# Patient Record
Sex: Male | Born: 1945 | Race: White | Hispanic: No | Marital: Married | Smoking: Former smoker
Health system: Southern US, Community
[De-identification: ages and names within clinical notes are randomized; demographics above are authoritative.]

## PROBLEM LIST (undated history)

## (undated) DIAGNOSIS — K5900 Constipation, unspecified: Secondary | ICD-10-CM

## (undated) DIAGNOSIS — C801 Malignant (primary) neoplasm, unspecified: Secondary | ICD-10-CM

## (undated) DIAGNOSIS — H409 Unspecified glaucoma: Secondary | ICD-10-CM

## (undated) DIAGNOSIS — G20A1 Parkinson's disease without dyskinesia, without mention of fluctuations: Secondary | ICD-10-CM

## (undated) DIAGNOSIS — K219 Gastro-esophageal reflux disease without esophagitis: Secondary | ICD-10-CM

## (undated) DIAGNOSIS — M1990 Unspecified osteoarthritis, unspecified site: Secondary | ICD-10-CM

## (undated) DIAGNOSIS — H269 Unspecified cataract: Secondary | ICD-10-CM

## (undated) HISTORY — DX: Constipation, unspecified: K59.00

## (undated) HISTORY — DX: Gastro-esophageal reflux disease without esophagitis: K21.9

## (undated) HISTORY — DX: Parkinson's disease without dyskinesia, without mention of fluctuations: G20.A1

## (undated) HISTORY — DX: Unspecified cataract: H26.9

## (undated) HISTORY — DX: Unspecified glaucoma: H40.9

## (undated) HISTORY — PX: TONSILLECTOMY: SUR1361

---

## 2012-07-11 ENCOUNTER — Other Ambulatory Visit: Payer: Self-pay | Admitting: Urology

## 2012-07-11 DIAGNOSIS — C61 Malignant neoplasm of prostate: Secondary | ICD-10-CM

## 2012-07-16 ENCOUNTER — Other Ambulatory Visit: Payer: Self-pay | Admitting: Urology

## 2012-08-02 ENCOUNTER — Other Ambulatory Visit (HOSPITAL_COMMUNITY): Payer: Self-pay | Admitting: Urology

## 2012-08-02 DIAGNOSIS — C61 Malignant neoplasm of prostate: Secondary | ICD-10-CM

## 2012-08-27 ENCOUNTER — Encounter (HOSPITAL_COMMUNITY): Payer: Self-pay

## 2012-08-27 ENCOUNTER — Encounter (HOSPITAL_COMMUNITY)
Admission: RE | Admit: 2012-08-27 | Discharge: 2012-08-27 | Disposition: A | Discharge status: Home / Self Care | Payer: BC Managed Care – PPO | Source: Ambulatory Visit | Attending: Urology | Admitting: Urology

## 2012-08-27 DIAGNOSIS — M4120 Other idiopathic scoliosis, site unspecified: Secondary | ICD-10-CM | POA: Insufficient documentation

## 2012-08-27 DIAGNOSIS — C61 Malignant neoplasm of prostate: Secondary | ICD-10-CM | POA: Insufficient documentation

## 2012-08-27 DIAGNOSIS — R972 Elevated prostate specific antigen [PSA]: Secondary | ICD-10-CM | POA: Insufficient documentation

## 2012-08-27 HISTORY — DX: Malignant (primary) neoplasm, unspecified: C80.1

## 2012-08-27 MED ORDER — TECHNETIUM TC 99M MEDRONATE IV KIT
25.0000 | PACK | Freq: Once | INTRAVENOUS | Status: AC | PRN
  Administered 2012-08-27: 25 via INTRAVENOUS

## 2012-08-28 ENCOUNTER — Encounter (HOSPITAL_COMMUNITY): Payer: Self-pay | Admitting: Pharmacy Technician

## 2012-09-05 ENCOUNTER — Encounter (HOSPITAL_COMMUNITY): Admission: RE | Admit: 2012-09-05 | Payer: Self-pay | Source: Ambulatory Visit

## 2012-09-05 ENCOUNTER — Encounter (HOSPITAL_COMMUNITY): Payer: Self-pay

## 2012-09-05 ENCOUNTER — Encounter (HOSPITAL_COMMUNITY)
Admission: RE | Admit: 2012-09-05 | Discharge: 2012-09-05 | Disposition: A | Discharge status: Home / Self Care | Payer: BC Managed Care – PPO | Source: Ambulatory Visit | Attending: Urology | Admitting: Urology

## 2012-09-05 DIAGNOSIS — Z01812 Encounter for preprocedural laboratory examination: Secondary | ICD-10-CM | POA: Insufficient documentation

## 2012-09-05 DIAGNOSIS — C61 Malignant neoplasm of prostate: Secondary | ICD-10-CM | POA: Insufficient documentation

## 2012-09-05 LAB — BASIC METABOLIC PANEL
CO2: 27 mEq/L (ref 19–32)
Calcium: 9 mg/dL (ref 8.4–10.5)
GFR calc non Af Amer: 86 mL/min — ABNORMAL LOW (ref >90)
Potassium: 4.5 mEq/L (ref 3.5–5.1)
Sodium: 137 mEq/L (ref 135–145)

## 2012-09-05 LAB — SURGICAL PCR SCREEN
MRSA, PCR: NEGATIVE
Staphylococcus aureus: NEGATIVE

## 2012-09-05 LAB — CBC
MCH: 32.2 pg (ref 26.0–34.0)
MCHC: 34.4 g/dL (ref 30.0–36.0)
Platelets: 204 10*3/uL (ref 150–400)
RBC: 4.47 MIL/uL (ref 4.22–5.81)

## 2012-09-05 NOTE — Pre-Procedure Instructions (Signed)
EKG AND CXR NOT NEEDED PER ANESTHESIOLOGIST'S GUIDELINES. 

## 2012-09-05 NOTE — Patient Instructions (Signed)
YOUR SURGERY IS SCHEDULED AT [facility]  ON:  Wednesday  5/14  REPORT TO [facility] AT:  10:30 AM      PHONE # FOR SHORT STAY IS [phone]  FOLLOW YOUR BOWEL PREP INSTRUCTIONS THE DAY BEFORE YOUR SURGERY - INSTRUCTIONS FROM DR. MANNY'S OFFICE.  DO NOT EAT  ANYTHING AFTER MIDNIGHT THE NIGHT BEFORE YOUR SURGERY.  NO FOOD, NO CHEWING GUM, NO MINTS, NO CANDIES, NO CHEWING TOBACCO. YOU MAY HAVE CLEAR LIQUIDS TO DRINK FROM MIDNIGHT UNTIL 7:00 AM DAY OF YOUR SURGERY - LIKE WATER, COFFEE ( NO MILK OR MILK PRODUCTS ).    NOTHING TO DRINK AFTER 7:00 AM DAY OF YOUR SURGERY.  PLEASE TAKE THE FOLLOWING MEDICATIONS THE AM OF YOUR SURGERY WITH A FEW SIPS OF WATER:  RANITIDINE.   BRING YOUR EYE DROPS TO THE HOSPITAL.    DO NOT BRING VALUABLES, MONEY, CREDIT CARDS.  DO NOT WEAR JEWELRY, MAKE-UP, NAIL POLISH AND NO METAL PINS OR CLIPS IN YOUR HAIR. CONTACT LENS, DENTURES / PARTIALS, GLASSES SHOULD NOT BE WORN TO SURGERY AND IN MOST CASES-HEARING AIDS WILL NEED TO BE REMOVED.  BRING YOUR GLASSES CASE, ANY EQUIPMENT NEEDED FOR YOUR CONTACT LENS. FOR PATIENTS ADMITTED TO THE HOSPITAL--CHECK OUT TIME THE DAY OF DISCHARGE IS 11:00 AM.  ALL INPATIENT ROOMS ARE PRIVATE - WITH BATHROOM, TELEPHONE, TELEVISION AND WIFI INTERNET.                                PLEASE READ OVER ANY  FACT SHEETS THAT YOU WERE GIVEN: MRSA INFORMATION, BLOOD TRANSFUSION INFORMATION FAILURE TO FOLLOW THESE INSTRUCTIONS MAY RESULT IN THE CANCELLATION OF YOUR SURGERY.   PATIENT SIGNATURE_________________________________

## 2012-09-12 ENCOUNTER — Encounter (HOSPITAL_COMMUNITY): Payer: Self-pay | Admitting: Certified Registered"

## 2012-09-12 ENCOUNTER — Inpatient Hospital Stay (HOSPITAL_COMMUNITY)
Admission: RE | Admit: 2012-09-12 | Discharge: 2012-09-13 | DRG: 335 | Disposition: A | Discharge status: Home / Self Care | Payer: BC Managed Care – PPO | Source: Ambulatory Visit | Attending: Urology | Admitting: Urology

## 2012-09-12 ENCOUNTER — Encounter (HOSPITAL_COMMUNITY): Payer: Self-pay | Admitting: *Deleted

## 2012-09-12 ENCOUNTER — Encounter (HOSPITAL_COMMUNITY)
Admission: RE | Disposition: A | Discharge status: Home / Self Care | Payer: Self-pay | Source: Ambulatory Visit | Attending: Urology

## 2012-09-12 ENCOUNTER — Inpatient Hospital Stay (HOSPITAL_COMMUNITY): Payer: BC Managed Care – PPO | Admitting: Certified Registered"

## 2012-09-12 DIAGNOSIS — K429 Umbilical hernia without obstruction or gangrene: Secondary | ICD-10-CM | POA: Diagnosis present

## 2012-09-12 DIAGNOSIS — Z01812 Encounter for preprocedural laboratory examination: Secondary | ICD-10-CM

## 2012-09-12 DIAGNOSIS — C61 Malignant neoplasm of prostate: Principal | ICD-10-CM | POA: Diagnosis present

## 2012-09-12 HISTORY — PX: ROBOT ASSISTED LAPAROSCOPIC RADICAL PROSTATECTOMY: SHX5141

## 2012-09-12 HISTORY — PX: LYMPHADENECTOMY: SHX5960

## 2012-09-12 LAB — HEMOGLOBIN AND HEMATOCRIT, BLOOD
HCT: 41.3 % (ref 39.0–52.0)
Hemoglobin: 14.2 g/dL (ref 13.0–17.0)

## 2012-09-12 LAB — TYPE AND SCREEN
ABO/RH(D): O NEG
Antibody Screen: NEGATIVE

## 2012-09-12 SURGERY — ROBOTIC ASSISTED LAPAROSCOPIC RADICAL PROSTATECTOMY
Anesthesia: General | Wound class: Clean Contaminated

## 2012-09-12 MED ORDER — ONDANSETRON HCL 4 MG/2ML IJ SOLN: 4.0000 mg | INTRAMUSCULAR | Status: DC | PRN

## 2012-09-12 MED ORDER — ONDANSETRON HCL 4 MG/2ML IJ SOLN
INTRAMUSCULAR | Status: DC | PRN
  Administered 2012-09-12: 4 mg via INTRAVENOUS

## 2012-09-12 MED ORDER — KCL IN DEXTROSE-NACL 20-5-0.45 MEQ/L-%-% IV SOLN
INTRAVENOUS | Status: AC
  Administered 2012-09-12: 21:00:00 via INTRAVENOUS
  Filled 2012-09-12: qty 1000

## 2012-09-12 MED ORDER — PROMETHAZINE HCL 25 MG/ML IJ SOLN
12.5000 mg | Freq: Four times a day (QID) | INTRAMUSCULAR | Status: DC | PRN
  Administered 2012-09-12: 12.5 mg via INTRAVENOUS
  Filled 2012-09-12: qty 1

## 2012-09-12 MED ORDER — HYDROMORPHONE HCL PF 1 MG/ML IJ SOLN: 0.5000 mg | INTRAMUSCULAR | Status: DC | PRN

## 2012-09-12 MED ORDER — RANITIDINE HCL 150 MG PO TABS
150.0000 mg | ORAL_TABLET | Freq: Two times a day (BID) | ORAL | Status: DC
  Administered 2012-09-13: 150 mg via ORAL

## 2012-09-12 MED ORDER — SODIUM CHLORIDE 0.9 % IJ SOLN
INTRAMUSCULAR | Status: DC | PRN
  Administered 2012-09-12: 15:00:00

## 2012-09-12 MED ORDER — FLUTICASONE PROPIONATE 50 MCG/ACT NA SUSP
1.0000 | Freq: Every day | NASAL | Status: DC
  Administered 2012-09-12 – 2012-09-13 (×2): 1 via NASAL
  Filled 2012-09-12: qty 16

## 2012-09-12 MED ORDER — ROCURONIUM BROMIDE 100 MG/10ML IV SOLN
INTRAVENOUS | Status: DC | PRN
  Administered 2012-09-12 (×2): 10 mg via INTRAVENOUS
  Administered 2012-09-12: 50 mg via INTRAVENOUS
  Administered 2012-09-12: 10 mg via INTRAVENOUS

## 2012-09-12 MED ORDER — BACITRACIN-NEOMYCIN-POLYMYXIN 400-5-5000 EX OINT: 1.0000 "application " | TOPICAL_OINTMENT | Freq: Three times a day (TID) | CUTANEOUS | Status: DC | PRN

## 2012-09-12 MED ORDER — ACETAMINOPHEN 10 MG/ML IV SOLN
1000.0000 mg | Freq: Four times a day (QID) | INTRAVENOUS | Status: AC
  Administered 2012-09-12 – 2012-09-13 (×4): 1000 mg via INTRAVENOUS
  Filled 2012-09-12 (×4): qty 100

## 2012-09-12 MED ORDER — MIDAZOLAM HCL 5 MG/5ML IJ SOLN
INTRAMUSCULAR | Status: DC | PRN
  Administered 2012-09-12: 2 mg via INTRAVENOUS

## 2012-09-12 MED ORDER — MENTHOL 3 MG MT LOZG: 1.0000 | LOZENGE | OROMUCOSAL | Status: DC | PRN

## 2012-09-12 MED ORDER — OXYCODONE HCL 5 MG PO TABS: 5.0000 mg | ORAL_TABLET | ORAL | Status: DC | PRN

## 2012-09-12 MED ORDER — CEFAZOLIN SODIUM-DEXTROSE 2-3 GM-% IV SOLR
2.0000 g | INTRAVENOUS | Status: AC
  Administered 2012-09-12: 2 g via INTRAVENOUS

## 2012-09-12 MED ORDER — LACTATED RINGERS IR SOLN
Status: DC | PRN
  Administered 2012-09-12: 1000 mL

## 2012-09-12 MED ORDER — FENTANYL CITRATE 0.05 MG/ML IJ SOLN
INTRAMUSCULAR | Status: DC | PRN
  Administered 2012-09-12: 100 ug via INTRAVENOUS
  Administered 2012-09-12 (×3): 50 ug via INTRAVENOUS

## 2012-09-12 MED ORDER — NEOSTIGMINE METHYLSULFATE 1 MG/ML IJ SOLN
INTRAMUSCULAR | Status: DC | PRN
  Administered 2012-09-12: 4 mg via INTRAVENOUS

## 2012-09-12 MED ORDER — ACETAMINOPHEN 325 MG PO TABS: 650.0000 mg | ORAL_TABLET | ORAL | Status: DC | PRN

## 2012-09-12 MED ORDER — LABETALOL HCL 5 MG/ML IV SOLN
INTRAVENOUS | Status: DC | PRN
  Administered 2012-09-12 (×2): 5 mg via INTRAVENOUS

## 2012-09-12 MED ORDER — SENNA 8.6 MG PO TABS
1.0000 | ORAL_TABLET | Freq: Two times a day (BID) | ORAL | Status: DC
  Administered 2012-09-12 – 2012-09-13 (×2): 8.6 mg via ORAL
  Filled 2012-09-12 (×2): qty 1

## 2012-09-12 MED ORDER — HYDROCODONE-ACETAMINOPHEN 5-325 MG PO TABS: 1.0000 | ORAL_TABLET | Freq: Four times a day (QID) | ORAL | Status: DC | PRN

## 2012-09-12 MED ORDER — PROPOFOL 10 MG/ML IV BOLUS
INTRAVENOUS | Status: DC | PRN
  Administered 2012-09-12: 200 mg via INTRAVENOUS

## 2012-09-12 MED ORDER — KCL IN DEXTROSE-NACL 20-5-0.45 MEQ/L-%-% IV SOLN
INTRAVENOUS | Status: DC
  Administered 2012-09-13: 07:00:00 via INTRAVENOUS
  Filled 2012-09-12 (×3): qty 1000

## 2012-09-12 MED ORDER — HYDROMORPHONE HCL PF 1 MG/ML IJ SOLN
INTRAMUSCULAR | Status: DC | PRN
  Administered 2012-09-12 (×2): 1 mg via INTRAVENOUS

## 2012-09-12 MED ORDER — PSEUDOEPHEDRINE HCL 60 MG PO TABS
60.0000 mg | ORAL_TABLET | ORAL | Status: DC | PRN
  Filled 2012-09-12: qty 1

## 2012-09-12 MED ORDER — GLYCOPYRROLATE 0.2 MG/ML IJ SOLN
INTRAMUSCULAR | Status: DC | PRN
  Administered 2012-09-12: .7 mg via INTRAVENOUS

## 2012-09-12 MED ORDER — PHENOL 1.4 % MT LIQD: 1.0000 | OROMUCOSAL | Status: DC | PRN

## 2012-09-12 MED ORDER — ACETAMINOPHEN 10 MG/ML IV SOLN
INTRAVENOUS | Status: DC | PRN
  Administered 2012-09-12: 1000 mg via INTRAVENOUS

## 2012-09-12 MED ORDER — LACTATED RINGERS IV SOLN
INTRAVENOUS | Status: AC
  Administered 2012-09-12: 1000 mL via INTRAVENOUS
  Administered 2012-09-12: 14:00:00 via INTRAVENOUS

## 2012-09-12 MED ORDER — ZOLPIDEM TARTRATE 5 MG PO TABS: 5.0000 mg | ORAL_TABLET | Freq: Every evening | ORAL | Status: DC | PRN

## 2012-09-12 MED ORDER — DOCUSATE SODIUM 100 MG PO CAPS
100.0000 mg | ORAL_CAPSULE | Freq: Two times a day (BID) | ORAL | Status: DC
  Administered 2012-09-12 – 2012-09-13 (×2): 100 mg via ORAL
  Filled 2012-09-12 (×3): qty 1

## 2012-09-12 MED ORDER — HYDROMORPHONE HCL PF 1 MG/ML IJ SOLN: 0.2500 mg | INTRAMUSCULAR | Status: DC | PRN

## 2012-09-12 MED ORDER — SODIUM CHLORIDE 0.9 % IV SOLN: 1.5000 g | INTRAVENOUS | Status: DC

## 2012-09-12 MED ORDER — BUPIVACAINE LIPOSOME 1.3 % IJ SUSP
20.0000 mL | Freq: Once | INTRAMUSCULAR | Status: DC
  Filled 2012-09-12: qty 20

## 2012-09-12 MED ORDER — BELLADONNA ALKALOIDS-OPIUM 16.2-60 MG RE SUPP: 1.0000 | Freq: Four times a day (QID) | RECTAL | Status: DC | PRN

## 2012-09-12 MED ORDER — CIPROFLOXACIN HCL 500 MG PO TABS: 500.0000 mg | ORAL_TABLET | Freq: Two times a day (BID) | ORAL | Status: DC

## 2012-09-12 MED ORDER — FAMOTIDINE 20 MG PO TABS
20.0000 mg | ORAL_TABLET | Freq: Two times a day (BID) | ORAL | Status: DC
  Filled 2012-09-12: qty 1

## 2012-09-12 MED ORDER — LIDOCAINE HCL (PF) 2 % IJ SOLN
INTRAMUSCULAR | Status: DC | PRN
  Administered 2012-09-12: 20 mg

## 2012-09-12 MED ORDER — LACTATED RINGERS IV SOLN
INTRAVENOUS | Status: DC
  Administered 2012-09-12: 16:00:00 via INTRAVENOUS

## 2012-09-12 MED ORDER — ONDANSETRON HCL 4 MG/2ML IJ SOLN
4.0000 mg | INTRAMUSCULAR | Status: DC | PRN
  Administered 2012-09-12: 4 mg via INTRAVENOUS
  Filled 2012-09-12: qty 2

## 2012-09-12 MED ORDER — LATANOPROST 0.005 % OP SOLN
1.0000 [drp] | Freq: Every day | OPHTHALMIC | Status: DC
  Administered 2012-09-12: 1 [drp] via OPHTHALMIC
  Filled 2012-09-12: qty 2.5

## 2012-09-12 MED ORDER — DEXAMETHASONE SODIUM PHOSPHATE 10 MG/ML IJ SOLN
INTRAMUSCULAR | Status: DC | PRN
  Administered 2012-09-12: 10 mg via INTRAVENOUS

## 2012-09-12 MED ORDER — SUCCINYLCHOLINE CHLORIDE 20 MG/ML IJ SOLN
INTRAMUSCULAR | Status: DC | PRN
  Administered 2012-09-12: 100 mg via INTRAVENOUS

## 2012-09-12 SURGICAL SUPPLY — 51 items
CANISTER SUCTION 2500CC (MISCELLANEOUS) ×3 IMPLANT
CATH FOLEY 2WAY SLVR 18FR 30CC (CATHETERS) ×3 IMPLANT
CATH TIEMANN FOLEY 18FR 5CC (CATHETERS) ×3 IMPLANT
CHLORAPREP W/TINT 26ML (MISCELLANEOUS) ×3 IMPLANT
CLIP LIGATING HEM O LOK PURPLE (MISCELLANEOUS) ×12 IMPLANT
CLIP LIGATING HEMO LOK XL GOLD (MISCELLANEOUS) IMPLANT
CLOTH BEACON ORANGE TIMEOUT ST (SAFETY) ×3 IMPLANT
CONT SPECI 4OZ STER CLIK (MISCELLANEOUS) ×3 IMPLANT
CORD HIGH FREQUENCY UNIPOLAR (ELECTROSURGICAL) ×3 IMPLANT
COVER SURGICAL LIGHT HANDLE (MISCELLANEOUS) ×3 IMPLANT
COVER TIP SHEARS 8 DVNC (MISCELLANEOUS) ×2 IMPLANT
COVER TIP SHEARS 8MM DA VINCI (MISCELLANEOUS) ×1
CUTTER ECHEON FLEX ENDO 45 340 (ENDOMECHANICALS) ×3 IMPLANT
DECANTER SPIKE VIAL GLASS SM (MISCELLANEOUS) IMPLANT
DERMABOND ADVANCED (GAUZE/BANDAGES/DRESSINGS) ×1
DERMABOND ADVANCED .7 DNX12 (GAUZE/BANDAGES/DRESSINGS) ×2 IMPLANT
DRAPE SURG IRRIG POUCH 19X23 (DRAPES) ×3 IMPLANT
DRSG TEGADERM 2-3/8X2-3/4 SM (GAUZE/BANDAGES/DRESSINGS) ×12 IMPLANT
DRSG TEGADERM 4X4.75 (GAUZE/BANDAGES/DRESSINGS) ×6 IMPLANT
DRSG TEGADERM 6X8 (GAUZE/BANDAGES/DRESSINGS) ×6 IMPLANT
ELECT REM PT RETURN 9FT ADLT (ELECTROSURGICAL) ×3
ELECTRODE REM PT RTRN 9FT ADLT (ELECTROSURGICAL) ×2 IMPLANT
GAUZE SPONGE 2X2 8PLY STRL LF (GAUZE/BANDAGES/DRESSINGS) ×2 IMPLANT
GLOVE BIO SURGEON STRL SZ 6.5 (GLOVE) ×3 IMPLANT
GLOVE BIOGEL M STRL SZ7.5 (GLOVE) ×24 IMPLANT
GOWN STRL NON-REIN LRG LVL3 (GOWN DISPOSABLE) ×6 IMPLANT
GOWN STRL REIN XL XLG (GOWN DISPOSABLE) ×9 IMPLANT
HOLDER FOLEY CATH W/STRAP (MISCELLANEOUS) ×3 IMPLANT
IV LACTATED RINGERS 1000ML (IV SOLUTION) ×3 IMPLANT
KIT ACCESSORY DA VINCI DISP (KITS) ×1
KIT ACCESSORY DVNC DISP (KITS) ×2 IMPLANT
NEEDLE INSUFFLATION 14GA 120MM (NEEDLE) ×3 IMPLANT
NEEDLE SPNL 22GX9.0 ACCUTG (NEEDLE) ×3 IMPLANT
PACK ROBOT UROLOGY CUSTOM (CUSTOM PROCEDURE TRAY) ×3 IMPLANT
RELOAD GREEN ECHELON 45 (STAPLE) ×3 IMPLANT
SET TUBE IRRIG SUCTION NO TIP (IRRIGATION / IRRIGATOR) ×3 IMPLANT
SOLUTION ELECTROLUBE (MISCELLANEOUS) ×3 IMPLANT
SPONGE GAUZE 2X2 STER 10/PKG (GAUZE/BANDAGES/DRESSINGS) ×1
SPONGE LAP 4X18 X RAY DECT (DISPOSABLE) ×3 IMPLANT
SUT ETHILON 3 0 PS 1 (SUTURE) ×3 IMPLANT
SUT MNCRL AB 4-0 PS2 18 (SUTURE) ×6 IMPLANT
SUT PDS AB 1 CT1 27 (SUTURE) ×6 IMPLANT
SUT SILK 2 0 SH (SUTURE) ×3 IMPLANT
SUT VICRYL 0 UR6 27IN ABS (SUTURE) ×3 IMPLANT
SUT VLOC BARB 180 ABS3/0GR12 (SUTURE) ×9
SUTURE VLOC BRB 180 ABS3/0GR12 (SUTURE) ×6 IMPLANT
SYR 27GX1/2 1ML LL SAFETY (SYRINGE) ×3 IMPLANT
SYR 5ML LL (SYRINGE) ×3 IMPLANT
TOWEL OR NON WOVEN STRL DISP B (DISPOSABLE) ×3 IMPLANT
TROCAR 12M 150ML BLUNT (TROCAR) ×3 IMPLANT
WATER STERILE IRR 1500ML POUR (IV SOLUTION) ×6 IMPLANT

## 2012-09-12 NOTE — OR Nursing (Signed)
Used 0.48ml of fluorescence imaging dye

## 2012-09-12 NOTE — Anesthesia Postprocedure Evaluation (Signed)
  Anesthesia Post-op Note  Patient: Melvin [name]  Procedure(s) Performed: Procedure(s) (LRB): ROBOTIC ASSISTED LAPAROSCOPIC RADICAL PROSTATECTOMY (N/A) LYMPHADENECTOMY (Bilateral)  Patient Location: PACU  Anesthesia Type: General  Level of Consciousness: awake and alert   Airway and Oxygen Therapy: Patient Spontanous Breathing  Post-op Pain: mild  Post-op Assessment: Post-op Vital signs reviewed, Patient's Cardiovascular Status Stable, Respiratory Function Stable, Patent Airway and No signs of Nausea or vomiting  Last Vitals:  Filed Vitals:   09/12/12 1707  BP: 123/67  Pulse: 58  Temp: 36.9 C  Resp: 15    Post-op Vital Signs: stable   Complications: No apparent anesthesia complications

## 2012-09-12 NOTE — Transfer of Care (Signed)
Immediate Anesthesia Transfer of Care Note  Patient: Melvin [name]  Procedure(s) Performed: Procedure(s) (LRB): ROBOTIC ASSISTED LAPAROSCOPIC RADICAL PROSTATECTOMY (N/A) LYMPHADENECTOMY (Bilateral)  Patient Location: PACU  Anesthesia Type: General  Level of Consciousness: sedated, patient cooperative and responds to stimulaton  Airway & Oxygen Therapy: Patient Spontanous Breathing and Patient connected to face mask oxgen  Post-op Assessment: Report given to PACU RN and Post -op Vital signs reviewed and stable  Post vital signs: Reviewed and stable  Complications: No apparent anesthesia complications

## 2012-09-12 NOTE — Preoperative (Signed)
Beta Blockers   Reason not to administer Beta Blockers:Not Applicable 

## 2012-09-12 NOTE — Anesthesia Preprocedure Evaluation (Addendum)
Anesthesia Evaluation  Patient identified by MRN, date of birth, ID band Patient awake    Reviewed: Allergy & Precautions, H&P , NPO status , Patient's Chart, lab work & pertinent test results  Airway Mallampati: II TM Distance: >3 FB Neck ROM: full    Dental  (+) Dental Advisory Given and Caps All 4 upper front teeth are capped:   Pulmonary neg pulmonary ROS,  breath sounds clear to auscultation  Pulmonary exam normal       Cardiovascular Exercise Tolerance: Good negative cardio ROS  Rhythm:regular Rate:Normal     Neuro/Psych negative neurological ROS  negative psych ROS   GI/Hepatic negative GI ROS, Neg liver ROS,   Endo/Other  negative endocrine ROS  Renal/GU negative Renal ROS  negative genitourinary   Musculoskeletal   Abdominal   Peds  Hematology negative hematology ROS (+)   Anesthesia Other Findings   Reproductive/Obstetrics negative OB ROS                          Anesthesia Physical Anesthesia Plan  ASA: I  Anesthesia Plan: General   Post-op Pain Management:    Induction: Intravenous  Airway Management Planned: Oral ETT  Additional Equipment:   Intra-op Plan:   Post-operative Plan: Extubation in OR  Informed Consent: I have reviewed the patients History and Physical, chart, labs and discussed the procedure including the risks, benefits and alternatives for the proposed anesthesia with the patient or authorized representative who has indicated his/her understanding and acceptance.   Dental Advisory Given  Plan Discussed with: CRNA and Surgeon  Anesthesia Plan Comments:         Anesthesia Quick Evaluation

## 2012-09-12 NOTE — Brief Op Note (Signed)
09/12/2012  3:18 PM  PATIENT:  Melvin [name]  67 y.o. male  PRE-OPERATIVE DIAGNOSIS:  PROSTATE CANCER  POST-OPERATIVE DIAGNOSIS:  PROSTATE CANCER  PROCEDURE:  Procedure(s): ROBOTIC ASSISTED LAPAROSCOPIC RADICAL PROSTATECTOMY (N/A) LYMPHADENECTOMY (Bilateral)  SURGEON:  Surgeon(s) and Role:    * Sebastian Ache, MD - Primary  PHYSICIAN ASSISTANT:   ASSISTANTS: Lujean Rave, PA   ANESTHESIA:   general  EBL:  Total I/O In: 1000 [I.V.:1000] Out: -   BLOOD ADMINISTERED:none  DRAINS: 1 - JP to bulb suction, 2 - Foley to straight drain   LOCAL MEDICATIONS USED:  MARCAINE     SPECIMEN:  Source of Specimen:  1 - Rt and Lt Obturator nodes, Lt ext. iliac node, Rt ext iliac nodes sentinal, Radical prostatectomy  DISPOSITION OF SPECIMEN:  PATHOLOGY  COUNTS:  YES  TOURNIQUET:  * No tourniquets in log *  DICTATION: .Other Dictation: Dictation Number [id number]  PLAN OF CARE: Admit for overnight observation  PATIENT DISPOSITION:  PACU - hemodynamically stable.   Delay start of Pharmacological VTE agent (>24hrs) due to surgical blood loss or risk of bleeding: yes

## 2012-09-12 NOTE — H&P (Signed)
Melvin [name] is an 67 y.o. male.    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 Volume 25mL, no medain lobe by TRUS. CT and bone scan w/o evidence of distant disease. Small fat-containing umbilical hernia noted.  PMH for hiatal hernia on PPI. Only prior surgery is childhood TNA. No CV disease. No blood thinners.   Today Melvin [name] is seen to proceed with robotic prostatectomy + node dissection + repair small umbilical hernia.   Past Medical History  Diagnosis Date  . Cancer     No past surgical history on file.  No family history on file. Social History:  has no tobacco, alcohol, and drug history on file.  Allergies:  Allergies  Allergen Reactions  . Sulfa Antibiotics Hives    No prescriptions prior to admission    No results found for this or any previous visit (from the past 48 hour(s)). No results found.  Review of Systems  Constitutional: Negative.  Negative for fever, chills and weight loss.  HENT: Negative.   Eyes: Negative.   Respiratory: Negative.   Cardiovascular: Negative.  Negative for PND.  Gastrointestinal: Negative.   Genitourinary: Negative.   Musculoskeletal: Negative.   Skin: Negative.   Neurological: Negative.   Endo/Heme/Allergies: Negative.   Psychiatric/Behavioral: Negative.     There were no vitals taken for this visit. Physical Exam  Constitutional: He is oriented to person, place, and time. He appears well-developed and well-nourished.  HENT:  Head: Normocephalic and atraumatic.  Eyes: EOM are normal. Pupils are equal, round, and reactive to light.  Neck: Normal range of motion. Neck supple.  Cardiovascular: Normal rate.   Respiratory: Effort normal and breath sounds normal.  GI: Soft. Bowel sounds  are normal.  Small umbilical hernia palpable  Genitourinary: Penis normal.  Musculoskeletal: Normal range of motion.  Neurological: He is alert and oriented to person, place, and time.  Skin: Skin is warm and dry.  Psychiatric: He has a normal mood and affect. His behavior is normal. Judgment and thought content normal.     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  We re-discussed prostatectomy and specifically robotic prostatectomy with bilateral pelvic lymphadenectomy being the technique that I most commonly perform. I showed the patient on their abdomen the approximately 6 small incision (trocar) sites as well as presumed extraction sites with robotic approach as well as possible open incision sites should open conversion be necessary. We discussed peri-operative risks including bleeding, infection, deep vein thrombosis, pulmonary embolism, compartment syndrome, nuropathy / neuropraxia, heart attack, stroke, death, as well as long-term risks such as non-cure / need for additional therapy. We specifically addressed that the procedure would compromise urinary control leading to stress incontinence which typically resolves with time and pelvic rehabilitation (Kegel's, etc..), but can sometimes be permanent and require additional therapy including surgery. We also specifically addressed sexual sequellae including significant erectile dysfunction which typically partially resolves with time but can also be permanent and require additional therapy including surgery.   We re-discussed the typical hospital course including usual 1-2 night hospitalization, discharge with foley catheter in place usually for 1-2 weeks before voiding trial as well as usually 2 week recovery until able to perform most non-strenuous activity and 6 weeks until  able to return to most jobs and more strenuous activity such as exercise.   He wants to proceed with surgery as scheduled.  Will also repair small umbilical hernia and perform extended template + ICG sentinal nodes given moderate risk disease.   Melvin [name] 09/12/2012, 6:29 AM

## 2012-09-13 ENCOUNTER — Encounter (HOSPITAL_COMMUNITY): Payer: Self-pay | Admitting: Urology

## 2012-09-13 LAB — HEMOGLOBIN AND HEMATOCRIT, BLOOD: Hemoglobin: 13.2 g/dL (ref 13.0–17.0)

## 2012-09-13 LAB — CREATININE, FLUID (PLEURAL, PERITONEAL, JP DRAINAGE): Creat, Fluid: 0.9 mg/dL

## 2012-09-13 MED ORDER — OXYBUTYNIN CHLORIDE 5 MG PO TABS: 5.0000 mg | ORAL_TABLET | Freq: Three times a day (TID) | ORAL | Status: DC | PRN

## 2012-09-13 MED ORDER — SENNA-DOCUSATE SODIUM 8.6-50 MG PO TABS: 1.0000 | ORAL_TABLET | Freq: Two times a day (BID) | ORAL | Status: DC

## 2012-09-13 NOTE — Op Note (Signed)
NAMEKODIE, KISHI NO.:  [id number]  MEDICAL RECORD NO.:  [id number]  LOCATION:  1403                         FACILITY:  [facility]  PHYSICIAN:  Sebastian Ache, MD     DATE OF BIRTH:  01-Apr-1946  DATE OF PROCEDURE: 09/12/2012 DATE OF DISCHARGE:                              OPERATIVE REPORT   DIAGNOSIS:  Moderate risk prostate cancer.  PROCEDURE:  Robotic-assisted laparoscopic radical prostatectomy with bilateral extended pelvic lymphadenectomy, sentinel type.  ASSISTANT:  Pecola Leisure, PA.  ESTIMATED BLOOD LOSS:  Less than 100 mL.  SPECIMEN: 1. Radical prostatectomy. 2. Left external iliac lymph nodes. 3. Left obturator lymph nodes. 4. Right obturator lymph nodes. 5. Right external iliac lymph nodes,  sentinel.  FINDINGS:  Hyperfluorescent right proximal external iliac lymph nodes. This was just lateral to the right ureter.  DRAINS: 1. Al Pimple drain to bulb suction. 2. Foley catheter to straight drain.  INDICATION:  Mr. Neubert is a pleasant 67 year old gentleman with history of moderate risk prostate cancer that was found on workup with elevated PSA. Staging evaluation with CT and bone scan which revealed no evidence of distant disease.  Options were discussed including ablative therapies versus surgical extirpation and wished to proceed with the latter with minimally invasive assistance.  Informed consent was obtained and placed in medical record.  PROCEDURE IN DETAIL:  The patient being, Melvin [name], was verified. Procedure being robotic prostatectomy was confirmed.  Procedure was carried out.  Time-out was performed.  Intravenous antibiotics administered.  General endotracheal anesthesia was induced.  The patient was placed into a low lithotomy position.  Sterile field was created by prepping the patient's penis, perineum, and proximal thighs using iodine as well as the infraxiphoid abdomen using chlorhexidine gluconate.  He was further  fashioned on the operative table using 3-inch tape over padding.  Deep Trendelenburg position was performed and found to be suitably positioned.  Foley catheter was placed to straight drain. Next, a high-flow low pressure pneumoperitoneum was obtained using Veress technique in the infraumbilical midline.  A 12-mm robotic camera port was then placed in same location.  Laparoscopic examination of peritoneal cavity revealed no significant adhesions and no evidence of visceral injury.  Additional ports were placed as follows; a right paramedian 5-mm assist port, right paramedian 8-mm robotic port, right far lateral 12-mm assistant port, left paramedian 8-mm robotic port, and the far lateral 8-mm robotic port. Robot was docked and passed through its electronic checks, attention was then directed at development of the space of Retzius.  Incision was made lateral to the left medial umbilical ligament from the midline towards the area of the external ring and coursing along the iliac vessels.  The bladder was then carefully swept away from the sidewall towards the area of the endopelvic fascia.  The mirror image dissection was performed on the right side sweeping the bladder away from the pelvic side wall. Intervening medial attachments were taken down using cautery scissors to expose the anterior base of the prostate.  Next, using spinal needle placed just superior to the pubic symphysis.  The left lobe of the prostate was injected with 0.2 mL of indocyanine green as  was the right lobe with intervening aspiration technique to avoid spillage and endopelvic fascia was then carefully swept away from the lateral aspects of the prostate from the base toward the apex and dorsal venous complex was controlled using vascular stapler technique taking great care to avoid injury to the membranous urethra which did not occur and within approximately 15 minutes post ICG injection, the pelvis was  visualized under infrared fluorescence and there was found to be a single right external iliac lymph node that  was hyperfluorescent corresponding with the sentinel node.  No other hyperfluorescence  nodes were visualized in pelvis. Attention was then directed at pelvic lymphadenectomy first in the right side, all fiber fatty tissue in the confines of the obturator nerve, pelvic sidewall, and external iliac vein were carefully mobilized.  Hemostasis was achieved using cold clips and this was labeled as right obturator lymph nodes. The right external iliac packet was then carefully dissected to the confines being the genitofemoral nerve, iliac vein, pelvic sidewall, and ureter.  Again, a single hyperfluorescent node was within this packet and was just lateral to the ureter.  Hemostasis again achieved with cold clips.  A mirror image lymphadenectomy was performed on the left side. However, there are no sentinel nodes.  The left obturator and left external iliac packets were sent separately.  Obturator nerves were visualized after these maneuvers were found to be uninjured.  Next, the bladder neck dissection was performed.  We identified the bladder neck By moving the catheter back and forth.  Dissection proceeded in the anterior-posterior direction separating the bladder neck from the area of the base of the prostate.  The caliber of the bladder neck appeared to be such that reconstruction would not be necessary.  Posterior dissection was performed by incising 7 mm posterior-inferiorly to the inferior lip of the bladder dissecting directly posterior.  Bilateral vas deferens were encountered, dissected for distance of 4 cm, placed on gentle superior traction.  Bilateral seminal vesicles were encountered, dissected towards their tips, placed on gentle superior traction. Dissection was then proceeded in the plane of the [place] in a base- to-apex orientation thus exposing the vascular and  neural pedicles on each side, and these were controlled using sequential clipping technique from the base towards the apex.  At the area of the apex, the presumed neurovascular bundles were carefully swept lateral to the prostate. There was no obvious gross disease in this location.  I felt it was safe to proceed with bilateral nerve-sparing and this was  performed. Contralateral pedicle was similarly achieved with cold clips.  This exposed the membranous urethra.  This was approached on the anterior plane and transected using cold scissors.  This completely freed up the prostatectomy specimen which was placed into an EndoCatch bag for later retrieval.  Attention was then directed to the posterior reconstruction.  A 3-0 V-Loc suture was used to reapproximate the posterior urethral plate to the posterior bladder neck, was brought the structures into tension-free apposition.  Next, urethra and bladder anastomosis was performed using double-armed V-Loc suture from 6 o'clock to 12 o'clock position, taking great care to avoid inclusion of the Foley catheter.  A new Foley catheter was then easily placed and irrigated quantitatively without obvious leak.  Anterior reconstruction was performed by anchoring the previous anastomotic suture to the puboprostatic ligaments.  Hemostasis appeared excellent. All sponge and needle counts were correct.  A closed suction drain was brought through the left lateral-most robotic port into the area of the  pelvis and robot was undocked.  A previous 12-mm assistant port site was closed at the level of the fascia using a Carter-Thomason suture passer under laparoscopic vision.  The specimen was retrieved by extending the Previous camera port site for total distance of approximately 4 cm, removed specimens in its entirety, setting aside for permanent pathology and  closing at the level of fascia using figure-of-eight PDS x3 followed by approximation of Scarpa using  2-0 Vicryl.  All skin incisions were infiltrated with dilute Marcaine and closed at the level of the skin using subcuticular Monocryl followed by Dermabond.  A drain stitch was applied.  Procedure was then terminated.  The patient tolerated the procedure well.  There were no immediate periprocedural complications.  The patient was taken to the postanesthesia care unit in stable condition.          ______________________________ Sebastian Ache, MD     TM/MEDQ  D:  09/12/2012  T:  09/13/2012  Job:  [id number]

## 2012-09-13 NOTE — Progress Notes (Signed)
Utilization review completed.  

## 2012-09-13 NOTE — Progress Notes (Signed)
Pt BP 114/46.  Notified PA.  Will continue to monitor.

## 2012-09-13 NOTE — Discharge Summary (Signed)
Physician Discharge Summary  Patient ID: Melvin [name] MRN: [id number] DOB/AGE: 03-Sep-1945 68 y.o.  Admit date: 09/12/2012 Discharge date: 09/13/2012  Admission Diagnoses: Moderate Risk Prostate Cancer  Discharge Diagnoses: Moderate Risk Prostate Cancer   Discharged Condition: good  Hospital Course:   Pt underwent robotic assisted prostatectomy with bilateral pelvic lymphadenectomy on the day of admission without acute complications. He was admitted to the 4th floor Urology service post-op where he began his vigorous recovery. By POD 1, the day of discharge, he was ambulatory, tolerating regular diet, pain controlled with PO meds and felt to be adequate for discharge. His JP outputs trended down, Cr same as serum, therefore discontinued.   Consults: None  Significant Diagnostic Studies: labs: Hgb >10, Cr 0.9 of JP fluid  Treatments: surgery: robotic assisted prostatectomy with bilateral pelvic lymphadenectomy  Discharge Exam: Blood pressure 114/46, pulse 72, temperature 98.4 F (36.9 C), temperature source Oral, resp. rate 18, height 6' 1.5" (1.867 m), weight 96.571 kg (212 lb 14.4 oz), SpO2 98.00%. General appearance: alert, cooperative, appears stated age and Fiancee at bedside Head: Normocephalic, without obvious abnormality, atraumatic Eyes: conjunctivae/corneas clear. PERRL, EOM's intact. Fundi benign. Ears: normal TM's and external ear canals both ears Nose: Nares normal. Septum midline. Mucosa normal. No drainage or sinus tenderness. Throat: lips, mucosa, and tongue normal; teeth and gums normal Neck: no adenopathy, no carotid bruit, no JVD, supple, symmetrical, trachea midline and thyroid not enlarged, symmetric, no tenderness/mass/nodules Back: symmetric, no curvature. ROM normal. No CVA tenderness. Resp: clear to auscultation bilaterally Chest wall: no tenderness Cardio: regular rate and rhythm, S1, S2 normal, no murmur, click, rub or gallop GI: soft, non-tender; bowel  sounds normal; no masses,  no organomegaly Male genitalia: normal, Foley c/d/i with clear urine.  Extremities: extremities normal, atraumatic, no cyanosis or edema Pulses: 2+ and symmetric Skin: Skin color, texture, turgor normal. No rashes or lesions Lymph nodes: Cervical, supraclavicular, and axillary nodes normal. Neurologic: Grossly normal Incision/Wound: Recent port sites and extraction sites c/d/i. JP removed and dry dressing applied.  Disposition: Final discharge disposition not confirmed     Medication List    STOP taking these medications       multivitamin with minerals Tabs     PROBIOTIC DAILY PO     pseudoephedrine 60 MG tablet  Commonly known as:  SUDAFED      TAKE these medications       ciprofloxacin 500 MG tablet  Commonly known as:  CIPRO  Take 1 tablet (500 mg total) by mouth 2 (two) times daily. Start day prior to office visit for foley removal     fluocinonide cream 0.05 %  Commonly known as:  LIDEX  Apply 1 application topically 2 (two) times daily as needed. For skin rash     HYDROcodone-acetaminophen 5-325 MG per tablet  Commonly known as:  NORCO  Take 1-2 tablets by mouth every 6 (six) hours as needed for pain.     latanoprost 0.005 % ophthalmic solution  Commonly known as:  XALATAN  Place 1 drop into both eyes at bedtime.     mometasone 50 MCG/ACT nasal spray  Commonly known as:  NASONEX  Place 2 sprays into the nose daily as needed (for allergies).     oxybutynin 5 MG tablet  Commonly known as:  DITROPAN  Take 1 tablet (5 mg total) by mouth every 8 (eight) hours as needed. For bladder spasms while catheter in place     ranitidine 150 MG tablet  Commonly known  as:  ZANTAC  Take 150 mg by mouth 2 (two) times daily.     sennosides-docusate sodium 8.6-50 MG tablet  Commonly known as:  SENOKOT-S  Take 1 tablet by mouth 2 (two) times daily. While taking pain meds to prevent constipation           Follow-up Information   Follow up  with Sebastian Ache, MD On 09/20/2012. (at 8:00)    Contact information:   [address] [place] [address] [phone]       Signed: Sebastian Ache 09/13/2012, 2:52 PM

## 2012-09-13 NOTE — Care Management Note (Signed)
    Page 1 of 1   09/13/2012     3:21:40 PM   CARE MANAGEMENT NOTE 09/13/2012  Patient:  Melvin [name], Melvin [name]   Account Number:  [id number]  Date Initiated:  09/13/2012  Documentation initiated by:  Lanier Clam  Subjective/Objective Assessment:   ADMITTED W/PROSTATE CA.     Action/Plan:   FROM HOME   Anticipated DC Date:  09/14/2012   Anticipated DC Plan:  HOME/SELF CARE      DC Planning Services  CM consult      Choice offered to / List presented to:             Status of service:  Completed, signed off Medicare Important Message given?   (If response is "NO", the following Medicare IM given date fields will be blank) Date Medicare IM given:   Date Additional Medicare IM given:    Discharge Disposition:    Per UR Regulation:  Reviewed for med. necessity/level of care/duration of stay  If discussed at Long Length of Stay Meetings, dates discussed:    Comments:  09/13/12 Carrie Schoonmaker RN,BSN NCM [phone] S/P PROSTATECTOMY.

## 2012-09-13 NOTE — Progress Notes (Signed)
Pt ambulated twice along the entire 4 east unit.  Once earlier in the evening and then again at midnight. Pt tolerated well.  Complains of some lightheadedness when up OOB.  Pt assisted back to bed.  Will continue to monitor pt.

## 2017-02-24 ENCOUNTER — Ambulatory Visit (INDEPENDENT_AMBULATORY_CARE_PROVIDER_SITE_OTHER): Payer: BC Managed Care – PPO | Admitting: Orthopaedic Surgery

## 2017-02-24 ENCOUNTER — Ambulatory Visit (INDEPENDENT_AMBULATORY_CARE_PROVIDER_SITE_OTHER): Payer: BC Managed Care – PPO

## 2017-02-24 DIAGNOSIS — M76821 Posterior tibial tendinitis, right leg: Secondary | ICD-10-CM | POA: Insufficient documentation

## 2017-02-24 MED ORDER — PREDNISONE 10 MG (21) PO TBPK: ORAL_TABLET | ORAL | 0 refills | Status: DC

## 2017-02-24 MED ORDER — DICLOFENAC SODIUM 75 MG PO TBEC: 75.0000 mg | DELAYED_RELEASE_TABLET | Freq: Two times a day (BID) | ORAL | 2 refills | Status: DC

## 2017-02-24 MED ORDER — DICLOFENAC SODIUM 1 % TD GEL: 2.0000 g | Freq: Four times a day (QID) | TRANSDERMAL | 5 refills | Status: DC

## 2017-02-24 NOTE — Progress Notes (Signed)
Office Visit Note   Patient: Melvin [name]           Date of Birth: 03-19-1946           MRN: [id number] Visit Date: 02/24/2017              Requested by: Briscoe Deutscher, [place], [place] [address] PCP: Orpah Melter, MD   Assessment & Plan: Visit Diagnoses:  1. Posterior tibial tendon dysfunction (PTTD) of right lower extremity     Plan: Impression is right stage IIb posterior tibial tendon dysfunction.  We discussed the condition with the patient and his daughter.  Recommend immobilization in a Cam walker for 4-6 weeks and wean as tolerated.  Referral to physical therapy.  Prescription for prednisone taper, diclofenac, Voltaren gel.  Questions encouraged and answered.  Follow-up as needed.  If not better will consider advanced imaging.  Follow-Up Instructions: Return if symptoms worsen or fail to improve.   Orders:  Orders Placed This Encounter  Procedures  . XR Ankle Complete Right   Meds ordered this encounter  Medications  . predniSONE (STERAPRED UNI-PAK 21 TAB) 10 MG (21) TBPK tablet    Sig: Take as directed    Dispense:  21 tablet    Refill:  0  . diclofenac (VOLTAREN) 75 MG EC tablet    Sig: Take 1 tablet (75 mg total) by mouth 2 (two) times daily.    Dispense:  30 tablet    Refill:  2  . diclofenac sodium (VOLTAREN) 1 % GEL    Sig: Apply 2 g topically 4 (four) times daily.    Dispense:  1 Tube    Refill:  5      Procedures: No procedures performed   Clinical Data: No additional findings.   Subjective: No chief complaint on file.   Patient is a active 71 year old gentleman who comes in with approximately 1 month history of worsening right medial ankle pain.  Denies any injuries.  He walks mainly for exercise.  Denies any numbness and tingling.  The pain is localized to the posterior medial aspect of the ankle over the posterior tibial tendon.  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    Review of Systems  Constitutional: Negative.    All other systems reviewed and are negative.    Objective: Vital Signs: There were no vitals taken for this visit.  Physical Exam  Constitutional: He is oriented to person, place, and time. He appears well-developed and well-nourished.  HENT:  Head: Normocephalic and atraumatic.  Eyes: Pupils are equal, round, and reactive to light.  Neck: Neck supple.  Pulmonary/Chest: Effort normal.  Abdominal: Soft.  Musculoskeletal: Normal range of motion.  Neurological: He is alert and oriented to person, place, and time.  Skin: Skin is warm.  Psychiatric: He has a normal mood and affect. His behavior is normal. Judgment and thought content normal.  Nursing note and vitals reviewed.   Ortho Exam Right ankle exam shows a mildly flexible flatfoot with tenderness along the posterior tibial tendon at the medial malleolus level.  Navicular is nontender.  Achilles is nontender.  Plantar fascia is nontender.  Negative Tinel's the tarsal tunnel.  He is unable to perform a single heel raise. Specialty Comments:  No specialty comments available.  Imaging: Xr Ankle Complete Right  Result Date: 02/24/2017 No acute or bony structural abnormalities    PMFS History: Patient Active Problem List   Diagnosis Date Noted  .  Posterior tibial tendon dysfunction (PTTD) of right lower extremity 02/24/2017   Past Medical History:  Diagnosis Date  . Cancer     No family history on file.  Past Surgical History:  Procedure Laterality Date  . LYMPHADENECTOMY Bilateral 09/12/2012   Procedure: LYMPHADENECTOMY;  Surgeon: Alexis Frock, MD;  Location: [facility];  Service: Urology;  Laterality: Bilateral;  . ROBOT ASSISTED LAPAROSCOPIC RADICAL PROSTATECTOMY N/A 09/12/2012   Procedure: ROBOTIC ASSISTED LAPAROSCOPIC RADICAL PROSTATECTOMY;  Surgeon: Alexis Frock, MD;  Location: [facility];  Service: Urology;  Laterality: N/A;   Social History   Occupational History  . Not on file.   Social History Main Topics    . Smoking status: Former Smoker    Quit date: 09/12/1992  . Smokeless tobacco: Not on file  . Alcohol use Not on file  . Drug use: Unknown  . Sexual activity: Not on file

## 2017-02-28 ENCOUNTER — Ambulatory Visit: Payer: BC Managed Care – PPO | Attending: Orthopaedic Surgery

## 2017-02-28 DIAGNOSIS — M6281 Muscle weakness (generalized): Secondary | ICD-10-CM | POA: Insufficient documentation

## 2017-02-28 DIAGNOSIS — M25571 Pain in right ankle and joints of right foot: Secondary | ICD-10-CM | POA: Diagnosis present

## 2017-02-28 DIAGNOSIS — R2689 Other abnormalities of gait and mobility: Secondary | ICD-10-CM | POA: Diagnosis present

## 2017-02-28 DIAGNOSIS — M25671 Stiffness of right ankle, not elsewhere classified: Secondary | ICD-10-CM

## 2017-02-28 NOTE — Therapy (Signed)
[facility] [address] [place], [place], [address] Phone: [phone]   Fax:  [phone]  Physical Therapy Evaluation  Patient Details  Name: Melvin [name] MRN: [id number] Date of Birth: 04-13-1946 Referring Provider: Eduard Roux, MD  Encounter Date: 02/28/2017      PT End of Session - 02/28/17 1051    Visit Number 1   Number of Visits 10   Date for PT Re-Evaluation May 13, 2017   Authorization Type G-codes at 10   PT Start Time 0955   PT Stop Time 1043   PT Time Calculation (min) 48 min   Activity Tolerance Patient tolerated treatment well   Behavior During Therapy [facility] for tasks assessed/performed      Past Medical History:  Diagnosis Date  . Cancer [facility])     Past Surgical History:  Procedure Laterality Date  . LYMPHADENECTOMY Bilateral 09/12/2012   Procedure: LYMPHADENECTOMY;  Surgeon: Alexis Frock, MD;  Location: [facility];  Service: Urology;  Laterality: Bilateral;  . ROBOT ASSISTED LAPAROSCOPIC RADICAL PROSTATECTOMY N/A 09/12/2012   Procedure: ROBOTIC ASSISTED LAPAROSCOPIC RADICAL PROSTATECTOMY;  Surgeon: Alexis Frock, MD;  Location: [facility];  Service: Urology;  Laterality: N/A;    There were no vitals filed for this visit.       Subjective Assessment - 02/28/17 0956    Subjective Pt presents to PT with complaints of Rt ankle pain that began 6 month ago with significant increased pain ~1 week ago.  Pt reports a history of plantar fasciitis and that he is active including walking and jogging.  MD put pt in CAM boot and gave oral steroids.     Pertinent History prostate cancer-2014   Limitations Standing;Walking   How long can you walk comfortably? wearing boot now due to pain.     Diagnostic tests x-ray: negative   Patient Stated Goals reduce ankle pain with standing and walking, wean from boot   Currently in Pain? Yes   Pain Score 4    Pain Location Ankle   Pain Orientation Right   Pain  Descriptors / Indicators Dull   Pain Type Chronic pain   Pain Frequency Constant   Aggravating Factors  walking without boot   Pain Relieving Factors wearing boot, non weightbearing   Effect of Pain on Daily Activities not able to walk/jog for exercise            [facility] PT Assessment - 02/28/17 0001      Assessment   Medical Diagnosis Rt PTTD, posterior tibial tendinitis   Referring Provider Eduard Roux, MD   Onset Date/Surgical Date 08/29/16   Next MD Visit none   Prior Therapy none     Precautions   Precautions Other (comment)  cancer     Restrictions   Weight Bearing Restrictions Yes   Other Position/Activity Restrictions weight bearing in boot     Balance Screen   Has the patient fallen in the past 6 months No   Has the patient had a decrease in activity level because of a fear of falling?  No   Is the patient reluctant to leave their home because of a fear of falling?  No     Home Environment   Living Environment Private residence   Living Arrangements Spouse/significant other   Type of [place] to enter   Entrance Stairs-Number of Steps [place] to live on main level with bedroom/bathroom     Prior  Function   Level of Independence Independent   Vocation Full time employment   Vocation Requirements full time professor- standing    Leisure walk, jog     Cognition   Overall Cognitive Status Within Functional Limits for tasks assessed     Observation/Other Assessments   Focus on Therapeutic Outcomes (FOTO)  34% limitation     Posture/Postural Control   Posture/Postural Control Postural limitations   Posture Comments ER at Rt>Lt hip and and ankle. Pes planus bilaterally     ROM / Strength   AROM / PROM / Strength AROM;PROM;Strength     AROM   Overall AROM  Deficits   Overall AROM Comments Lt ankle A/ROM limited by 25% in inversion/eversion and DF, Rt ankle limited by 50% in eversion and PF and 25% in inversion and DF      PROM   Overall PROM  Deficits   Overall PROM Comments limited joint mobility in Lt ankle at calcaneous with rocking and mobs     Strength   Overall Strength Deficits   Overall Strength Comments Bil knee 4+/5, hip 4/5, Lt ankle 4/5, Rt eversion 3+/5, inversion 4/5, DF 4/5     Palpation   Palpation comment mild palpable tenderness over Rt medial aspect of ankle at posterior tibialis tendon.  Mild edema on the Rt.     Transfers   Transfers Independent with all Transfers     Ambulation/Gait   Ambulation/Gait Yes   Gait Pattern Step-through pattern;Decreased dorsiflexion - right;Decreased dorsiflexion - left;Decreased stride length;Right circumduction;Abducted- right            Objective measurements completed on examination: See above findings.                  PT Education - 02/28/17 1036    Education provided Yes   Education Details hamstring, gastroc stretch, ankle A/ROM   Person(s) Educated Patient   Methods Explanation;Demonstration;Handout   Comprehension Returned demonstration;Verbalized understanding          PT Short Term Goals - 02/28/17 1058      PT SHORT TERM GOAL #1   Title be independent in inital HEP   Time 4   Period Weeks   Status New   Target Date 03/28/17     PT SHORT TERM GOAL #2   Title report < or = to 2/10 ankle pain with walking in CAM boot   Time 4   Period Weeks   Status New   Target Date 03/28/17     PT SHORT TERM GOAL #3   Title demonstrate 4-/5 Rt ankle inversion strength to improve ankle stability   Time 4   Period Weeks   Status New   Target Date 03/28/17           PT Long Term Goals - 02/28/17 1059      PT LONG TERM GOAL #1   Title be independent in advanced HEP   Time 8   Status New   Target Date 04/25/17     PT LONG TERM GOAL #2   Title reduce FOTO to < or = to 27% limitation   Time 8   Period Weeks   Status New   Target Date 04/25/17     PT LONG TERM GOAL #3   Title wean from boot on  level surfaces and demonstrate symmetry with ambulation   Time 8   Period Weeks   Status New   Target Date 04/25/17     PT  LONG TERM GOAL #4   Title demonstrate 4 to 4+/5 Rt ankle strength to improve stability with walking and running   Time 8   Period Weeks   Status New   Target Date 04/25/17     PT LONG TERM GOAL #5   Title return to walking and running for exercise without limitation   Time 8   Period Weeks   Status New   Target Date 04/25/17                Plan - 02/28/17 1052    Clinical Impression Statement Pt presents to PT with >6 month history of Rt medial ankle pain.  Pt reports 9/10 pain last week and he saw orthopedic MD and received CAM boot, oral steroids and Voltaren.  Pt is active with walking and running and has not been able to do this for a week.  PT demonstrates significant ER at the Rt hip with standing and walking, pes planus bilaterally and limited eversion and plantar flexion on the Rt. Pt demonstrates weakness in the Rt ankle. Gait is antalgic wihtout boot.  Pt reports 4/10 Rt ankle pain now that he is wearing boot.  Pt will benefit from skilled PT for Rt ankle flexibilty, strength, hip flexibility to improve alignment, proprioception training and manual to manage pain and tissue dysfunction.     History and Personal Factors relevant to plan of care: none   Clinical Presentation Evolving   Clinical Presentation due to: increased pain over the past week   Clinical Decision Making Low   Rehab Potential Good   PT Frequency 2x / week   PT Duration 8 weeks   PT Treatment/Interventions ADLs/Self Care Home Management;Cryotherapy;Electrical Stimulation;Functional mobility training;Stair training;Gait training;Moist Heat;Therapeutic activities;Therapeutic exercise;Neuromuscular re-education;Patient/family education;Passive range of motion;Manual techniques;Dry needling;Taping   PT Next Visit Plan Rt ankle flexibility, manual to medial aspect at tibialis tendon,  ankle strength, gait   Recommended Other Services orthotics for pes planus   Consulted and Agree with Plan of Care Patient      Patient will benefit from skilled therapeutic intervention in order to improve the following deficits and impairments:  Abnormal gait, Decreased range of motion, Postural dysfunction, Decreased strength, Impaired flexibility, Pain, Decreased activity tolerance, Difficulty walking  Visit Diagnosis: Pain in right ankle and joints of right foot - Plan: PT plan of care cert/re-cert  Muscle weakness (generalized) - Plan: PT plan of care cert/re-cert  Other abnormalities of gait and mobility - Plan: PT plan of care cert/re-cert  Stiffness of right ankle, not elsewhere classified - Plan: PT plan of care cert/re-cert      G-Codes - 41/74/08 1013    Functional Assessment Tool Used (Outpatient Only) FOTO: 34% limitation   Functional Limitation Mobility: Walking and moving around   Mobility: Walking and Moving Around Current Status (X4481) At least 20 percent but less than 40 percent impaired, limited or restricted   Mobility: Walking and Moving Around Goal Status (E5631) At least 20 percent but less than 40 percent impaired, limited or restricted       Problem List Patient Active Problem List   Diagnosis Date Noted  . Posterior tibial tendon dysfunction (PTTD) of right lower extremity 02/24/2017     [name], PT 02/28/17 11:04 AM  [facility] [address], [place], [place], [address] Phone: [phone]   Fax:  [phone]  Name: Melvin [name] MRN: [id number] Date of Birth: 1945/08/25

## 2017-02-28 NOTE — Patient Instructions (Signed)
   Ankle Circles   Slowly rotate right foot and ankle clockwise then counterclockwise. Gradually increase range of motion. Avoid pain. Circle _10___ times each direction per set. Do _1___ sets per session. Do _4-5___ sessions per day.  http://orth.exer.us/30   Copyright  VHI. All rights reserved.  Long Sitting Bend   In long sitting position, bend and straighten toes. Then bend ankle, moving foot. Then bend and straighten knee. Do the moves to one leg and then the other. Repeat 10____ times. Do 4-5____ sessions per day.  http://gt2.exer.us/647   Copyright  VHI. All rights reserved.  Ankle Bend: Dorsiflexion / Plantar Flexion, Sitting   Sit with feet on floor. Point toes up, keeping both heels on floor. Then press toes to floor raising heels. Hold each position ___ seconds. Repeat _10__ times per session. Do 4-5___ sessions per day.  Copyright  VHI. All rights reserved.  Ankle Sideward Movement (Inversion / Eversion)   Sitting or lying with feet on floor, keep knee still and rock foot onto outer edge. Return to resting position. Now rock foot onto inner edge. Repeat with other foot, then with both feet. Repeat _10___ times. Do __4-5__ sessions per day.  http://gt2.exer.us/405   Copyright  VHI. All rights reserved.   Gastroc / Heel Cord Stretch - Seated With Towel   Sit on floor, towel around ball of foot. Gently pull foot in toward body, stretching heel cord and calf. Hold for _10__ seconds. Repeat on involved leg. Repeat __3-5_ times. Do 4-5___ times per day.  Copyright  VHI. All rights reserved.   [place] [address], [place], [place] [address] Phone # [phone] Fax [phone]  HIP: Hamstrings - Short Sitting    Rest leg on raised surface. Keep knee straight. Lift chest. Hold _20_ seconds. __3_ reps per set, __3_ sets per day   [facility] [address], [place],  [address] Phone #  [phone] Fax [phone]

## 2017-03-02 ENCOUNTER — Ambulatory Visit: Payer: BC Managed Care – PPO | Attending: Orthopaedic Surgery

## 2017-03-02 DIAGNOSIS — R2689 Other abnormalities of gait and mobility: Secondary | ICD-10-CM | POA: Insufficient documentation

## 2017-03-02 DIAGNOSIS — M25571 Pain in right ankle and joints of right foot: Secondary | ICD-10-CM | POA: Diagnosis present

## 2017-03-02 DIAGNOSIS — M6281 Muscle weakness (generalized): Secondary | ICD-10-CM | POA: Diagnosis present

## 2017-03-02 DIAGNOSIS — M25671 Stiffness of right ankle, not elsewhere classified: Secondary | ICD-10-CM | POA: Diagnosis present

## 2017-03-02 NOTE — Therapy (Signed)
[facility] [address], [place], [place], [address] Phone: [phone]   Fax:  [phone]  Physical Therapy Treatment  Patient Details  Name: Melvin [name] MRN: [id number] Date of Birth: [birth year] Referring Provider: Eduard Roux, MD  Encounter Date: 03/02/2017      PT End of Session - 03/02/17 1016    Visit Number 2   Date for PT Re-Evaluation 04/28/17   Authorization Type G-codes at 10   PT Start Time 0931   PT Stop Time 1017   PT Time Calculation (min) 46 min   Activity Tolerance Patient tolerated treatment well   Behavior During Therapy [facility] for tasks assessed/performed      Past Medical History:  Diagnosis Date  . Cancer [facility])     Past Surgical History:  Procedure Laterality Date  . LYMPHADENECTOMY Bilateral 09/12/2012   Procedure: LYMPHADENECTOMY;  Surgeon: Alexis Frock, MD;  Location: [facility];  Service: Urology;  Laterality: Bilateral;  . ROBOT ASSISTED LAPAROSCOPIC RADICAL PROSTATECTOMY N/A 09/12/2012   Procedure: ROBOTIC ASSISTED LAPAROSCOPIC RADICAL PROSTATECTOMY;  Surgeon: Alexis Frock, MD;  Location: [facility];  Service: Urology;  Laterality: N/A;    There were no vitals filed for this visit.      Subjective Assessment - 03/02/17 0939    Subjective No pain today.     Currently in Pain? No/denies                         [facility] Adult PT Treatment/Exercise - 03/02/17 0001      Exercises   Exercises Ankle;Knee/Hip     Knee/Hip Exercises: Stretches   Active Hamstring Stretch Both;3 reps;20 seconds   Gastroc Stretch Both;3 reps;20 seconds     Knee/Hip Exercises: Aerobic   Nustep Level 2 legs only x 7 minutes     Ankle Exercises: Stretches   Gastroc Stretch 3 reps;10 seconds  step and wall   Slant Board Stretch 3 reps;20 seconds     Ankle Exercises: Seated   Ankle Circles/Pumps Strengthening;20 reps;Right   BAPS Level 1;Sitting;15 reps  4 directions.  Assist by PT for  control                PT Education - 03/02/17 1013    Education provided Yes   Education Details gastroc stretch   Person(s) Educated Patient   Methods Explanation;Demonstration;Handout   Comprehension Returned demonstration;Verbalized understanding          PT Short Term Goals - 02/28/17 1058      PT SHORT TERM GOAL #1   Title be independent in inital HEP   Time 4   Period Weeks   Status New   Target Date 03/28/17     PT SHORT TERM GOAL #2   Title report < or = to 2/10 ankle pain with walking in CAM boot   Time 4   Period Weeks   Status New   Target Date 03/28/17     PT SHORT TERM GOAL #3   Title demonstrate 4-/5 Rt ankle inversion strength to improve ankle stability   Time 4   Period Weeks   Status New   Target Date 03/28/17           PT Long Term Goals - 02/28/17 1059      PT LONG TERM GOAL #1   Title be independent in advanced HEP   Time 8   Status New   Target Date 2017-04-28  PT LONG TERM GOAL #2   Title reduce FOTO to < or = to 27% limitation   Time 8   Period Weeks   Status New   Target Date 04/25/17     PT LONG TERM GOAL #3   Title wean from boot on level surfaces and demonstrate symmetry with ambulation   Time 8   Period Weeks   Status New   Target Date 04/25/17     PT LONG TERM GOAL #4   Title demonstrate 4 to 4+/5 Rt ankle strength to improve stability with walking and running   Time 8   Period Weeks   Status New   Target Date 04/25/17     PT LONG TERM GOAL #5   Title return to walking and running for exercise without limitation   Time 8   Period Weeks   Status New   Target Date 04/25/17               Plan - 03/02/17 6222    Clinical Impression Statement Pt with 1st session after evaluation today.  Pt is independent and compliant in initial HEP.  Pt denies any Rt ankle pain today.  Thickness over medial aspect of ankle with soft tissue work today.  Pt with improved alignment of Rt LE today with practice at  home.  Pt with reduced Rt ankle flexibilty and strength/proprioception.  Pt will continue to benefit from skilled PT for Rt ankle strength, flexibility and manual.     Rehab Potential Good   PT Frequency 2x / week   PT Duration 8 weeks   PT Treatment/Interventions ADLs/Self Care Home Management;Cryotherapy;Electrical Stimulation;Functional mobility training;Stair training;Gait training;Moist Heat;Therapeutic activities;Therapeutic exercise;Neuromuscular re-education;Patient/family education;Passive range of motion;Manual techniques;Dry needling;Taping   PT Next Visit Plan Rt ankle flexibility, manual to medial aspect at tibialis tendon, ankle strength, gait   PT Home Exercise Plan initial certification is signed   Consulted and Agree with Plan of Care Patient      Patient will benefit from skilled therapeutic intervention in order to improve the following deficits and impairments:  Abnormal gait, Decreased range of motion, Postural dysfunction, Decreased strength, Impaired flexibility, Pain, Decreased activity tolerance, Difficulty walking  Visit Diagnosis: Pain in right ankle and joints of right foot  Muscle weakness (generalized)  Other abnormalities of gait and mobility  Stiffness of right ankle, not elsewhere classified     Problem List Patient Active Problem List   Diagnosis Date Noted  . Posterior tibial tendon dysfunction (PTTD) of right lower extremity 02/24/2017     [name], PT 03/02/17 10:18 AM   [facility] [address] [place], [place], [address] Phone: [phone]   Fax:  [phone]  Name: Naser Schuld MRN: [id number] Date of Birth: Jan 09, 1946

## 2017-03-02 NOTE — Patient Instructions (Addendum)
ANKLE: Dorsiflexion, Step Bilateral    Stand on step, hang both heels off the back of step. Hold _10-20__ seconds. _3__ reps per set, _2__ sets per day, ___ days per week Hold onto a support.  Copyright  VHI. All rights reserved.  Achilles Tendon Stretch    Stand with hands supported on wall, elbows slightly bent, feet parallel and both heels on floor, front knee bent, back knee straight. Slowly relax back knee until a stretch is felt in achilles tendon. Hold __20__ seconds. Repeat with leg positions switched.  Copyright  VHI. All rights reserved.  Achilles / Soleus, Standing    Stand, right foot behind, heel on floor and turned slightly out. Lower hips and bend knees. Hold __20_ seconds. Repeat __3_ times per session. Do _3__ sessions per day.  Copyright  VHI. All rights reserved.   [place] [address], [place], [place] [address] Phone # [phone] Fax [phone]

## 2017-03-06 ENCOUNTER — Ambulatory Visit: Payer: BC Managed Care – PPO

## 2017-03-06 DIAGNOSIS — M25571 Pain in right ankle and joints of right foot: Secondary | ICD-10-CM

## 2017-03-06 DIAGNOSIS — M25671 Stiffness of right ankle, not elsewhere classified: Secondary | ICD-10-CM

## 2017-03-06 DIAGNOSIS — M6281 Muscle weakness (generalized): Secondary | ICD-10-CM

## 2017-03-06 DIAGNOSIS — R2689 Other abnormalities of gait and mobility: Secondary | ICD-10-CM

## 2017-03-06 NOTE — Therapy (Signed)
[facility] [address] [place], [place], [address] Phone: [phone]   Fax:  [phone]  Physical Therapy Treatment  Patient Details  Name: Melvin [name] MRN: [id number] Date of Birth: 1945-09-30 Referring Provider: Eduard Roux, MD   Encounter Date: 03/06/2017  PT End of Session - 03/06/17 1016    Visit Number  3    Number of Visits  10    Date for PT Re-Evaluation  04/25/17    Authorization Type  G-codes at 10    PT Start Time  0930    PT Stop Time  1014    PT Time Calculation (min)  44 min    Activity Tolerance  Patient tolerated treatment well    Behavior During Therapy  [facility] for tasks assessed/performed       Past Medical History:  Diagnosis Date  . Cancer [facility])     History reviewed. No pertinent surgical history.  There were no vitals filed for this visit.  Subjective Assessment - 03/06/17 0937    Subjective  I am done taking oral steroid.  Now taking anti inflammatory medication.  Pt will see Dr Tamala Julian this week.     Currently in Pain?  No/denies                      [facility] Adult PT Treatment/Exercise - 03/06/17 0001      Knee/Hip Exercises: Stretches   Active Hamstring Stretch  Both;3 reps;20 seconds    Gastroc Stretch  Both;3 reps;20 seconds using slant board   using slant board     Knee/Hip Exercises: Aerobic   Nustep  Level 2 legs only x 8 minutes PT present to discuss progress   PT present to discuss progress     Knee/Hip Exercises: Standing   SLS  3x10 seconds on the Rt      Manual Therapy   Manual Therapy  Soft tissue mobilization;Myofascial release    Manual therapy comments  soft tissue elongation and cross friction to Rt medial ankle tendons      Ankle Exercises: Stretches   Gastroc Stretch  3 reps;10 seconds step and wall   step and wall     Ankle Exercises: Seated   BAPS  Level 1;Sitting;15 reps 4 directions.  Assist by PT for control   4 directions.  Assist  by PT for control              PT Short Term Goals - 03/06/17 0937      PT SHORT TERM GOAL #1   Title  be independent in inital HEP    Status  Achieved      PT SHORT TERM GOAL #2   Title  report < or = to 2/10 ankle pain with walking in CAM boot    Status  Achieved        PT Long Term Goals - 02/28/17 1059      PT LONG TERM GOAL #1   Title  be independent in advanced HEP    Time  8    Status  New    Target Date  04/25/17      PT LONG TERM GOAL #2   Title  reduce FOTO to < or = to 27% limitation    Time  8    Period  Weeks    Status  New    Target Date  04/25/17      PT LONG TERM GOAL #3  Title  wean from boot on level surfaces and demonstrate symmetry with ambulation    Time  8    Period  Weeks    Status  New    Target Date  04/25/17      PT LONG TERM GOAL #4   Title  demonstrate 4 to 4+/5 Rt ankle strength to improve stability with walking and running    Time  8    Period  Weeks    Status  New    Target Date  04/25/17      PT LONG TERM GOAL #5   Title  return to walking and running for exercise without limitation    Time  8    Period  Weeks    Status  New    Target Date  04/25/17            Plan - 03/06/17 7425    Clinical Impression Statement  Pt continues to wear CAM boot for all walking.  Pt is independent in HEP for flexibility and proprioception.  Pt with improved alignment of Rt LE with standing and walking.  Pt with difficulty with proprioception activities on the Rt.  Pt with thickness of the tendon over the medial aspect of the ankle and demonstrated reduced soreness and improved mobility after soft tissue work today.  Pt will continue to benefit from skilled PT for Rt ankle strength, flexibility and manual.      PT Frequency  2x / week    PT Duration  8 weeks    PT Treatment/Interventions  ADLs/Self Care Home Management;Cryotherapy;Electrical Stimulation;Functional mobility training;Stair training;Gait training;Moist Heat;Therapeutic  activities;Therapeutic exercise;Neuromuscular re-education;Patient/family education;Passive range of motion;Manual techniques;Dry needling;Taping    PT Next Visit Plan  Rt ankle flexibility, manual to medial aspect at tibialis tendon, ankle strength, gait    Consulted and Agree with Plan of Care  Patient       Patient will benefit from skilled therapeutic intervention in order to improve the following deficits and impairments:  Abnormal gait, Decreased range of motion, Postural dysfunction, Decreased strength, Impaired flexibility, Pain, Decreased activity tolerance, Difficulty walking  Visit Diagnosis: Pain in right ankle and joints of right foot  Muscle weakness (generalized)  Other abnormalities of gait and mobility  Stiffness of right ankle, not elsewhere classified     Problem List Patient Active Problem List   Diagnosis Date Noted  . Posterior tibial tendon dysfunction (PTTD) of right lower extremity 02/24/2017     [name], PT 03/06/17 10:18 AM  [facility] [address], [place], [place], [address] Phone: [phone]   Fax:  [phone]  Name: Adelbert Gaspard MRN: [id number] Date of Birth: [birth year]

## 2017-03-08 NOTE — Progress Notes (Signed)
[name] [address], [place] [address] Phone: [phone] Subjective:    I'm seeing this patient by the request  of:    CC: feet pain   EVO:[id number]  Melvin [name] is a 71 y.o. male coming in with complaint of feet pain  Has seen other providers for this.  Has been diagnosed with more of a posterior tibialis tendinitis.  Was sent to formal physical therapy.  Patient states that his pain increased 2 weeks ago. He went into [facility]. Was told that he has tendonitis. He tried using Prednisone and has been doing PT. He is unsure if this is helping. He likes to walk and jog but has been unable to do so. Patient is wearing a short cam walker today which he states has alleviated his pain somewhat. His pain is near posterior-medial malleolus.  Patient rates the severity of pain is 6 out of 10.  States that this seems to be associated with some swelling.  Denies numbness.    Patient did have x-rays taken February 24, 2017.  These were independently visualized by me.  No significant bony abnormality noted  Past Medical History:  Diagnosis Date  . Cancer ([place])    No past surgical history on file. Social History   Socioeconomic History  . Marital status: Divorced    Spouse name: Not on file  . Number of children: Not on file  . Years of education: Not on file  . Highest education level: Not on file  Social Needs  . Financial resource strain: Not on file  . Food insecurity - worry: Not on file  . Food insecurity - inability: Not on file  . Transportation needs - medical: Not on file  . Transportation needs - non-medical: Not on file  Occupational History  . Not on file  Tobacco Use  . Smoking status: Former Smoker    Last attempt to quit: 09/12/1992    Years since quitting: 24.5  Substance and Sexual Activity  . Alcohol use: Not on file  . Drug use: Not on file  . Sexual activity: Not on file  Other Topics Concern  . Not on file    Social History Narrative  . Not on file   Allergies  Allergen Reactions  . Sulfa Antibiotics Hives   No family history on file.  No family history of autoimmune   Past medical history, social, surgical and family history all reviewed in electronic medical record.  No pertanent information unless stated regarding to the chief complaint.   Review of Systems:Review of systems updated and as accurate as of 03/09/17  No headache, visual changes, nausea, vomiting, diarrhea, constipation, dizziness, abdominal pain, skin rash, fevers, chills, night sweats, weight loss, swollen lymph nodes, body aches, joint swelling, muscle aches, chest pain, shortness of breath, mood changes.   Objective  Blood pressure 128/86, pulse 72, height 6\' 2"  (1.88 m), weight 213 lb (96.6 kg), SpO2 98 %. Systems examined below as of 03/09/17   General: No apparent distress alert and oriented x3 mood and affect normal, dressed appropriately.  HEENT: Pupils equal, extraocular movements intact  Respiratory: Patient's speak in full sentences and does not appear short of breath  Cardiovascular: No lower extremity edema, non tender, no erythema  Skin: Warm dry intact with no signs of infection or rash on extremities or on axial skeleton.  Abdomen: Soft nontender  Neuro: Cranial nerves II through XII are intact, neurovascularly intact in all extremities  with 2+ DTRs and 2+ pulses.  Lymph: No lymphadenopathy of posterior or anterior cervical chain or axillae bilaterally.  Gait normal with good balance and coordination.  MSK:  Non tender with full range of motion and good stability and symmetric strength and tone of shoulders, elbows, wrist, hip, knee  bilaterally.  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  Significant breakdown of the longitudinal and transverse arch bilaterally right greater than left Range of motion is full in all directions. Strength is  5/5 in all directions. Stable lateral and medial ligaments; squeeze test and kleiger test unremarkable; Talar dome nontender; No pain at base of 5th MT; No tenderness over cuboid; No tenderness over N spot or navicular prominence Pain over the posterior tibialis Able to walk 4 steps.  MSK US performed of: This study was ordered, performed, and interpreted by Charlann Boxer D.O.  Foot/Ankle:   All structures visualized.   Mild to moderate narrowing of the ankle joint itself.  Patient's posterior tibialis tendon has significant hypoechoic changes within the tendon sheath.  IMPRESSION: Posterior tibialis tendinitis  Procedure: Real-time Ultrasound Guided Injection of posterior tibialis tendon sheath Device: GE Logiq Q7 Ultrasound guided injection is preferred based studies that show increased duration, increased effect, greater accuracy, decreased procedural pain, increased response rate, and decreased cost with ultrasound guided versus blind injection.  Verbal informed consent obtained.  Time-out conducted.  Noted no overlying erythema, induration, or other signs of local infection.  Skin prepped in a sterile fashion.  Local anesthesia: Topical Ethyl chloride.  With sterile technique and under real time ultrasound guidance: With a 25-gauge half inch needle injected with 0.5 cc of 0.5% Marcaine and 0.5 cc of Kenalog 40 mg/dL. Completed without difficulty  Pain immediately resolved suggesting accurate placement of the medication.  Advised to call if fevers/chills, erythema, induration, drainage, or persistent bleeding.  Images permanently stored and available for review in the ultrasound unit.  Impression: Technically successful ultrasound guided injection.    Impression and Recommendations:     This case required medical decision making of moderate complexity.      Note: This dictation was prepared with Dragon dictation along with smaller phrase technology. Any transcriptional  errors that result from this process are unintentional.

## 2017-03-09 ENCOUNTER — Ambulatory Visit: Payer: Self-pay

## 2017-03-09 ENCOUNTER — Ambulatory Visit: Payer: BC Managed Care – PPO | Admitting: Physical Therapy

## 2017-03-09 ENCOUNTER — Ambulatory Visit (INDEPENDENT_AMBULATORY_CARE_PROVIDER_SITE_OTHER): Payer: BC Managed Care – PPO | Admitting: Family Medicine

## 2017-03-09 VITALS — BP 128/86 | HR 72 | Ht 74.0 in | Wt 213.0 lb

## 2017-03-09 DIAGNOSIS — R2689 Other abnormalities of gait and mobility: Secondary | ICD-10-CM

## 2017-03-09 DIAGNOSIS — G8929 Other chronic pain: Secondary | ICD-10-CM | POA: Diagnosis not present

## 2017-03-09 DIAGNOSIS — M76821 Posterior tibial tendinitis, right leg: Secondary | ICD-10-CM

## 2017-03-09 DIAGNOSIS — M6281 Muscle weakness (generalized): Secondary | ICD-10-CM

## 2017-03-09 DIAGNOSIS — M25571 Pain in right ankle and joints of right foot: Secondary | ICD-10-CM

## 2017-03-09 DIAGNOSIS — M25671 Stiffness of right ankle, not elsewhere classified: Secondary | ICD-10-CM

## 2017-03-09 NOTE — Assessment & Plan Note (Signed)
Patient given injection.  We discussed heel lift, we discussed over-the-counter orthotics, home exercises icing regimen and topical anti-inflammatories.  Following up again in 4 weeks.  Could be a candidate for custom orthotics in the long run.

## 2017-03-09 NOTE — Therapy (Signed)
[facility] [address] [place], [place], [address] Phone: [phone]   Fax:  [phone]  Physical Therapy Treatment  Patient Details  Name: Melvin [name] MRN: [id number] Date of Birth: 04/05/1946 Referring Provider: Eduard Roux, MD   Encounter Date: 03/09/2017  PT End of Session - 03/09/17 1323    Visit Number  4    Number of Visits  10    Date for PT Re-Evaluation  04/25/17    Authorization Type  G-codes at 10    PT Start Time  08/07/1236    PT Stop Time  1327 10 min untimed modality end of session     PT Time Calculation (min)  49 min    Activity Tolerance  Patient tolerated treatment well;No increased pain    Behavior During Therapy  WFL for tasks assessed/performed       Past Medical History:  Diagnosis Date  . Cancer ([place])     No past surgical history on file.  There were no vitals filed for this visit.  Subjective Assessment - 03/09/17 1240    Subjective  Pt reports that things are going ok. He saw Dr. Tamala Julian this morning who gave him a topical cream and completed ultrasound to further examine the tendon.     Currently in Pain?  No/denies no pain sitting here          [facility] PT Assessment - 03/09/17 0001      AROM   AROM Assessment Site  Ankle    Right/Left Ankle  Right    Right Ankle Plantar Flexion  20 35                  [facility] Adult PT Treatment/Exercise - 03/09/17 0001      Knee/Hip Exercises: Stretches   Gastroc Stretch  3 reps;30 seconds;Right;Other (comment) with strap       Manual Therapy   Manual therapy comments  Ankle PF MWM 3x10 reps with 15 deg improvement, Grade IV AP talocrural mobilization, grade IV PA 1st MTP mobilization, Grade III/IV ankle distraction 3x10 sec hold, Rt navicular MWM into inversion 2x10 reps AAROM, 2-4 metatarsal mobilizations Rt side only       Ankle Exercises: Supine   Other Supine Ankle Exercises  Rt ankle ABC's x1 round       Ankle Exercises:  Stretches   Other Stretch  Rt ankle PF stretch 3x20 sec              PT Education - 03/09/17 1320    Education provided  Yes    Education Details  ankle PF stretch; possibility of increased soreness following manual techniques completed this session     Person(s) Educated  Patient    Methods  Explanation;Handout    Comprehension  Verbalized understanding;Returned demonstration       PT Short Term Goals - 03/06/17 0937      PT SHORT TERM GOAL #1   Title  be independent in inital HEP    Status  Achieved      PT SHORT TERM GOAL #2   Title  report < or = to 2/10 ankle pain with walking in CAM boot    Status  Achieved        PT Long Term Goals - 02/28/17 1059      PT LONG TERM GOAL #1   Title  be independent in advanced HEP    Time  8    Status  New  Target Date  04/25/17      PT LONG TERM GOAL #2   Title  reduce FOTO to < or = to 27% limitation    Time  8    Period  Weeks    Status  New    Target Date  04/25/17      PT LONG TERM GOAL #3   Title  wean from boot on level surfaces and demonstrate symmetry with ambulation    Time  8    Period  Weeks    Status  New    Target Date  04/25/17      PT LONG TERM GOAL #4   Title  demonstrate 4 to 4+/5 Rt ankle strength to improve stability with walking and running    Time  8    Period  Weeks    Status  New    Target Date  04/25/17      PT LONG TERM GOAL #5   Title  return to walking and running for exercise without limitation    Time  8    Period  Weeks    Status  New    Target Date  04/25/17            Plan - 03/09/17 1324    Clinical Impression Statement  Pt arrived today having seen the orthopedic MD earlier today. He is also planning to look into orthotics per the MDs suggestion. Session focused on manual techniques to improve Rt ankle/foot mobility, with 10-15 deg improvements noted in plantarflexion following treatment. Pt continues to demonstrate limitations in ankle mobility and RLE strength and  would continue to benefit from skilled PT.     PT Frequency  2x / week    PT Duration  8 weeks    PT Treatment/Interventions  ADLs/Self Care Home Management;Cryotherapy;Electrical Stimulation;Functional mobility training;Stair training;Gait training;Moist Heat;Therapeutic activities;Therapeutic exercise;Neuromuscular re-education;Patient/family education;Passive range of motion;Manual techniques;Dry needling;Taping    PT Next Visit Plan  Rt ankle flexibility/ROM, manual to medial aspect at tibialis tendon, hip and ankle strength, gait    Consulted and Agree with Plan of Care  Patient       Patient will benefit from skilled therapeutic intervention in order to improve the following deficits and impairments:  Abnormal gait, Decreased range of motion, Postural dysfunction, Decreased strength, Impaired flexibility, Pain, Decreased activity tolerance, Difficulty walking  Visit Diagnosis: Pain in right ankle and joints of right foot  Muscle weakness (generalized)  Other abnormalities of gait and mobility  Stiffness of right ankle, not elsewhere classified     Problem List Patient Active Problem List   Diagnosis Date Noted  . Posterior tibial tendon dysfunction (PTTD) of right lower extremity 02/24/2017    1:54 PM,03/09/17 Elly Modena PT, DPT [facility] [address], [place], [place], [address] Phone: [phone]   Fax:  [phone]  Name: Melvin [name] MRN: [id number] Date of Birth: 1946-03-02

## 2017-03-09 NOTE — Patient Instructions (Signed)
Good to see you  Melvin [name] is your friend.  pennsaid pinkie amount topically 2 times daily as needed.   Continue the exercises you got.  Consider vitamin D 2000 IU daily  [employer] orthotics "total support" online would be great  See me again in 4 weeks

## 2017-03-09 NOTE — Patient Instructions (Signed)
   Sitting with right leg crossed over the left. Gently pull your foot down. Hold for atleast 20 sec, repeat 3x, 2-3x/day   [facility] [address], [place], [place] [address] Phone # [phone] Fax [phone]

## 2017-03-13 ENCOUNTER — Ambulatory Visit: Payer: BC Managed Care – PPO | Admitting: Physical Therapy

## 2017-03-13 DIAGNOSIS — M25571 Pain in right ankle and joints of right foot: Secondary | ICD-10-CM | POA: Diagnosis not present

## 2017-03-13 DIAGNOSIS — R2689 Other abnormalities of gait and mobility: Secondary | ICD-10-CM

## 2017-03-13 DIAGNOSIS — M6281 Muscle weakness (generalized): Secondary | ICD-10-CM

## 2017-03-13 DIAGNOSIS — M25671 Stiffness of right ankle, not elsewhere classified: Secondary | ICD-10-CM

## 2017-03-13 NOTE — Patient Instructions (Signed)
  STRAIGHT LEG RAISE - SLR EXTERNAL ROTATION  While lying or sitting, raise up your leg with a straight knee and your toes pointed outward.  2x15 reps     [facility] [address], [place], [place] [address] Phone # [phone] Fax [phone]

## 2017-03-13 NOTE — Therapy (Signed)
[facility] [address], [place], [place], [address] Phone: [phone]   Fax:  [phone]  Physical Therapy Treatment  Patient Details  Name: Melvin [name] MRN: [id number] Date of Birth: 09-20-1945 Referring Provider: Eduard Roux, MD   Encounter Date: 03/13/2017  PT End of Session - 03/13/17 1124    Visit Number  5    Number of Visits  10    Date for PT Re-Evaluation  04/25/17    Authorization Type  G-codes at 10    PT Start Time  1100    PT Stop Time  1140    PT Time Calculation (min)  40 min    Activity Tolerance  Patient tolerated treatment well;No increased pain    Behavior During Therapy  WFL for tasks assessed/performed       Past Medical History:  Diagnosis Date  . Cancer ([place])     No past surgical history on file.  There were no vitals filed for this visit.  Subjective Assessment - 03/13/17 1100    Subjective  Pt reports that his ankle was a little sore last session following his exercises, but things are good now. He continues to complete his HEP without difficulty.     Currently in Pain?  No/denies                      [facility] Adult PT Treatment/Exercise - 03/13/17 0001      Knee/Hip Exercises: Supine   Bridges  2 sets;15 reps    Straight Leg Raises  Right;2 sets;15 reps    Other Supine Knee/Hip Exercises  Single leg clams with green TB x15 reps each      Manual Therapy   Manual Therapy  Joint mobilization    Manual therapy comments  therapist overpressure during inversion stretch    Joint Mobilization  Ankle inversion mobilization, Grade IV AP talocrural jt. mobs      Ankle Exercises: Seated   Other Seated Ankle Exercises  arch squeeze 15x3 sec hold on the Rt     Other Seated Ankle Exercises  B ankle inversion ball squeeze 10x5 sec       Ankle Exercises: Stretches   Gastroc Stretch  5 reps;20 seconds;Other (comment) RLE with strap    Other Stretch  Rt ankle inversion  stretch 10x10 sec hold with therapist overpressure             PT Education - 03/13/17 1123    Education provided  Yes    Education Details  technique with therex; implications for hip strengthening     Person(s) Educated  Patient    Methods  Explanation;Verbal cues    Comprehension  Verbalized understanding       PT Short Term Goals - 03/06/17 0937      PT SHORT TERM GOAL #1   Title  be independent in inital HEP    Status  Achieved      PT SHORT TERM GOAL #2   Title  report < or = to 2/10 ankle pain with walking in CAM boot    Status  Achieved        PT Long Term Goals - 02/28/17 1059      PT LONG TERM GOAL #1   Title  be independent in advanced HEP    Time  8    Status  New    Target Date  04/25/17      PT LONG TERM GOAL #  2   Title  reduce FOTO to < or = to 27% limitation    Time  8    Period  Weeks    Status  New    Target Date  04/25/17      PT LONG TERM GOAL #3   Title  wean from boot on level surfaces and demonstrate symmetry with ambulation    Time  8    Period  Weeks    Status  New    Target Date  04/25/17      PT LONG TERM GOAL #4   Title  demonstrate 4 to 4+/5 Rt ankle strength to improve stability with walking and running    Time  8    Period  Weeks    Status  New    Target Date  04/25/17      PT LONG TERM GOAL #5   Title  return to walking and running for exercise without limitation    Time  8    Period  Weeks    Status  New    Target Date  04/25/17            Plan - 03/13/17 1133    Clinical Impression Statement  Pt continues to complete HEP at home without reported difficulty. Incorporated more hip strengthening this session to address areas of weakness noted during ambulation and other activities. Pt did demonstrate muscle fatigue with straight leg raises. Also performed manual techniques to improve ankle/foot mobility with pt reporting no increase in pain during or following today's session. Will continue with current POC.     PT Frequency  2x / week    PT Duration  8 weeks    PT Treatment/Interventions  ADLs/Self Care Home Management;Cryotherapy;Electrical Stimulation;Functional mobility training;Stair training;Gait training;Moist Heat;Therapeutic activities;Therapeutic exercise;Neuromuscular re-education;Patient/family education;Passive range of motion;Manual techniques;Dry needling;Taping    PT Next Visit Plan  Rt ankle flexibility/ROM, manual to medial aspect at tibialis tendon, hip and ankle strength, gait    Consulted and Agree with Plan of Care  Patient       Patient will benefit from skilled therapeutic intervention in order to improve the following deficits and impairments:  Abnormal gait, Decreased range of motion, Postural dysfunction, Decreased strength, Impaired flexibility, Pain, Decreased activity tolerance, Difficulty walking  Visit Diagnosis: Pain in right ankle and joints of right foot  Muscle weakness (generalized)  Other abnormalities of gait and mobility  Stiffness of right ankle, not elsewhere classified     Problem List Patient Active Problem List   Diagnosis Date Noted  . Posterior tibial tendon dysfunction (PTTD) of right lower extremity 02/24/2017   11:41 AM,03/13/17 Elly Modena PT, DPT [place] [facility] [address], [place], [place], [address] Phone: [phone]   Fax:  [phone]  Name: Melvin [name] MRN: [id number] Date of Birth: [birth year]

## 2017-03-17 ENCOUNTER — Ambulatory Visit: Payer: BC Managed Care – PPO | Admitting: Physical Therapy

## 2017-03-17 DIAGNOSIS — M25571 Pain in right ankle and joints of right foot: Secondary | ICD-10-CM

## 2017-03-17 DIAGNOSIS — M25671 Stiffness of right ankle, not elsewhere classified: Secondary | ICD-10-CM

## 2017-03-17 DIAGNOSIS — M6281 Muscle weakness (generalized): Secondary | ICD-10-CM

## 2017-03-17 DIAGNOSIS — R2689 Other abnormalities of gait and mobility: Secondary | ICD-10-CM

## 2017-03-17 NOTE — Therapy (Signed)
[facility] [address], [place], [place], [address] Phone: [phone]   Fax:  [phone]  Physical Therapy Treatment  Patient Details  Name: Melvin [name] MRN: [id number] Date of Birth: 1945-07-09 Referring Provider: Eduard Roux, MD   Encounter Date: 03/17/2017  PT End of Session - 03/17/17 1014    Visit Number  6    Number of Visits  10    Date for PT Re-Evaluation  04/25/17    Authorization Type  G-codes at 10    PT Start Time  0932    PT Stop Time  1023 10 min untimed    PT Time Calculation (min)  51 min    Activity Tolerance  Patient tolerated treatment well;No increased pain    Behavior During Therapy  WFL for tasks assessed/performed       Past Medical History:  Diagnosis Date  . Cancer [facility])     Past Surgical History:  Procedure Laterality Date  . LYMPHADENECTOMY Bilateral 09/12/2012   Performed by Alexis Frock, MD at [facility]S  . ROBOTIC ASSISTED LAPAROSCOPIC RADICAL PROSTATECTOMY N/A 09/12/2012   Performed by Alexis Frock, MD at [facility]    There were no vitals filed for this visit.  Subjective Assessment - 03/17/17 0935    Subjective  Pt reports that things continue to improve. He only notices some soreness when on his feet for a long time. Otherwise, he is able to ice the foot and this helps take any soreness away. He also thinks the swelling has improved significantly. He is hoping to go through his exercises and establish which ones he needs to be completing.     Currently in Pain?  No/denies         [facility] PT Assessment - 03/17/17 0001      AROM   Right/Left Ankle  Right    Right Ankle Dorsiflexion  5 passive     Right Ankle Plantar Flexion  40    Right Ankle Inversion  15                  [facility] Adult PT Treatment/Exercise - 03/17/17 0001      Manual Therapy   Joint Mobilization  Rt Grade IV talocrural AP jt mobs, Rt ankle PF MWM 3x10 reps; lateral ankle mobilization  grade IV to increase inversion       Ankle Exercises: Stretches   Other Stretch  Rt ankle inversion stretch 2x30 sec; Rt ankle PF stretch 2x30 sec       Ankle Exercises: Seated   Other Seated Ankle Exercises  arch squeeze 15x3 sec hold on the Rt              PT Education - 03/17/17 1015    Education provided  Yes    Education Details  reviewed pt's list of exercises accumulated over time and made adjustments and answered pt questions regarding set up and implications for each    Person(s) Educated  Patient    Methods  Explanation;Handout    Comprehension  Verbalized understanding;Returned demonstration       PT Short Term Goals - 03/06/17 0937      PT SHORT TERM GOAL #1   Title  be independent in inital HEP    Status  Achieved      PT SHORT TERM GOAL #2   Title  report < or = to 2/10 ankle pain with walking in CAM boot    Status  Achieved  PT Long Term Goals - 02/28/17 1059      PT LONG TERM GOAL #1   Title  be independent in advanced HEP    Time  8    Status  New    Target Date  04/25/17      PT LONG TERM GOAL #2   Title  reduce FOTO to < or = to 27% limitation    Time  8    Period  Weeks    Status  New    Target Date  04/25/17      PT LONG TERM GOAL #3   Title  wean from boot on level surfaces and demonstrate symmetry with ambulation    Time  8    Period  Weeks    Status  New    Target Date  04/25/17      PT LONG TERM GOAL #4   Title  demonstrate 4 to 4+/5 Rt ankle strength to improve stability with walking and running    Time  8    Period  Weeks    Status  New    Target Date  04/25/17      PT LONG TERM GOAL #5   Title  return to walking and running for exercise without limitation    Time  8    Period  Weeks    Status  New    Target Date  04/25/17            Plan - 03/17/17 1013    Clinical Impression Statement  Pt continues to report improvements in mobility and activity tolerance at home. Session focused initially on  reviewing pt's HEP and making adjustments to ensure he is able to manage his program consistently at home. Also completed manual techniques, noting improvements in ankle inversion, dorsiflexion and plantarflexion overall since the start of therapy. Pt reported no increase in his pain following today's session and verbalized good understanding of his HEP moving forward.      PT Frequency  2x / week    PT Duration  8 weeks    PT Treatment/Interventions  ADLs/Self Care Home Management;Cryotherapy;Electrical Stimulation;Functional mobility training;Stair training;Gait training;Moist Heat;Therapeutic activities;Therapeutic exercise;Neuromuscular re-education;Patient/family education;Passive range of motion;Manual techniques;Dry needling;Taping    PT Next Visit Plan  Rt ankle flexibility/ROM, manual to medial aspect at tibialis tendon, hip and ankle strength, gait    PT Home Exercise Plan  initial certification is signed    Consulted and Agree with Plan of Care  Patient       Patient will benefit from skilled therapeutic intervention in order to improve the following deficits and impairments:  Abnormal gait, Decreased range of motion, Postural dysfunction, Decreased strength, Impaired flexibility, Pain, Decreased activity tolerance, Difficulty walking  Visit Diagnosis: Pain in right ankle and joints of right foot  Muscle weakness (generalized)  Other abnormalities of gait and mobility  Stiffness of right ankle, not elsewhere classified     Problem List Patient Active Problem List   Diagnosis Date Noted  . Posterior tibial tendon dysfunction (PTTD) of right lower extremity 02/24/2017    10:16 AM,03/17/17 Elly Modena PT, DPT [place] [facility] [place] [facility] [address], [place], [place], [address] Phone: [phone]   Fax:  [phone]  Name: Melvin [name] MRN: [id number] Date of  Birth: 09-Jan-1946

## 2017-03-20 ENCOUNTER — Ambulatory Visit: Payer: BC Managed Care – PPO | Admitting: Physical Therapy

## 2017-03-20 DIAGNOSIS — M25571 Pain in right ankle and joints of right foot: Secondary | ICD-10-CM

## 2017-03-20 DIAGNOSIS — M25671 Stiffness of right ankle, not elsewhere classified: Secondary | ICD-10-CM

## 2017-03-20 DIAGNOSIS — M6281 Muscle weakness (generalized): Secondary | ICD-10-CM

## 2017-03-20 DIAGNOSIS — R2689 Other abnormalities of gait and mobility: Secondary | ICD-10-CM

## 2017-03-20 NOTE — Therapy (Signed)
[facility] [address], [place], [place], [address] Phone: [phone]   Fax:  [phone]  Physical Therapy Treatment  Patient Details  Name: Melvin [name] MRN: [id number] Date of Birth: [birth year] Referring Provider: Eduard Roux, MD   Encounter Date: 03/20/2017  PT End of Session - 03/20/17 1000    Visit Number  7    Number of Visits  10    Date for PT Re-Evaluation  04/25/17    Authorization Type  G-codes at 10    PT Start Time  0932    PT Stop Time  1020 10 min untimed modality     PT Time Calculation (min)  48 min    Activity Tolerance  Patient tolerated treatment well;No increased pain    Behavior During Therapy  WFL for tasks assessed/performed       Past Medical History:  Diagnosis Date  . Cancer [facility])     Past Surgical History:  Procedure Laterality Date  . LYMPHADENECTOMY Bilateral 09/12/2012   Performed by Alexis Frock, MD at [facility]S  . ROBOTIC ASSISTED LAPAROSCOPIC RADICAL PROSTATECTOMY N/A 09/12/2012   Performed by Alexis Frock, MD at [facility]    There were no vitals filed for this visit.  Subjective Assessment - 03/20/17 0933    Subjective  Pt reports that things are going well. He continues to increase his walking distance, up to 20-25 minutes this morning without pain. He is completed his exercises as well without any issues. No pain currently.     Currently in Pain?  No/denies                      [facility] Adult PT Treatment/Exercise - 03/20/17 0001      Modalities   Modalities  Cryotherapy      Cryotherapy   Number Minutes Cryotherapy  10 Minutes    Cryotherapy Location  Ankle Rt    Type of Cryotherapy  Ice pack      Ankle Exercises: Seated   Toe Raise  15 reps;Other (comment) 1st MTP    BAPS  Level 2;Sitting;Other (comment) x1 min CW/ x1 min CCW    Other Seated Ankle Exercises  B arch squeeze 15x3 sec hold; ankle DF x20 reps     Other Seated Ankle Exercises  B  ankle inversion ball squeeze 15x3 sec; ankle PF on the Rt with double yellow TB x20 reps       Ankle Exercises: Standing   Other Standing Ankle Exercises  NBOS on foam 3x30 sec; tandem each LE forward 2x30 sec each             PT Education - 03/20/17 0935    Education provided  Yes    Education Details  benefits of working on balance activity safely at home; encouraged pt to continue with wearing his shoe inserts    Person(s) Educated  Patient    Methods  Explanation    Comprehension  Verbalized understanding       PT Short Term Goals - 03/06/17 0937      PT SHORT TERM GOAL #1   Title  be independent in inital HEP    Status  Achieved      PT SHORT TERM GOAL #2   Title  report < or = to 2/10 ankle pain with walking in CAM boot    Status  Achieved        PT Long Term Goals - 03/20/17 1013  PT LONG TERM GOAL #1   Title  be independent in advanced HEP    Time  8    Status  On-going      PT LONG TERM GOAL #2   Title  reduce FOTO to < or = to 27% limitation    Time  8    Period  Weeks    Status  On-going      PT LONG TERM GOAL #3   Title  wean from boot on level surfaces and demonstrate symmetry with ambulation    Time  8    Period  Weeks    Status  Achieved      PT LONG TERM GOAL #4   Title  demonstrate 4 to 4+/5 Rt ankle strength to improve stability with walking and running    Time  8    Period  Weeks    Status  On-going      PT LONG TERM GOAL #5   Title  return to walking and running for exercise without limitation    Time  8    Period  Weeks    Status  On-going            Plan - 03/20/17 1012    Clinical Impression Statement  Pt arrived today without his inserts, although he does report improvements in his walking tolerance, up to 20-25 minutes currently. Session continued with therex to improve intrinsic foot and ankle strength on the RLE. Also added some static balance activity, noting increased difficulty with tandem stance due to poor  ankle reactions and stiffness. Ended session without reports of pain, will continue with current POC.     PT Frequency  2x / week    PT Duration  8 weeks    PT Treatment/Interventions  ADLs/Self Care Home Management;Cryotherapy;Electrical Stimulation;Functional mobility training;Stair training;Gait training;Moist Heat;Therapeutic activities;Therapeutic exercise;Neuromuscular re-education;Patient/family education;Passive range of motion;Manual techniques;Dry needling;Taping    PT Next Visit Plan  Rt ankle flexibility/ROM, manual to medial aspect at tibialis tendon, hip and ankle strength, gait    PT Home Exercise Plan  initial certification is signed    Consulted and Agree with Plan of Care  Patient       Patient will benefit from skilled therapeutic intervention in order to improve the following deficits and impairments:  Abnormal gait, Decreased range of motion, Postural dysfunction, Decreased strength, Impaired flexibility, Pain, Decreased activity tolerance, Difficulty walking  Visit Diagnosis: Pain in right ankle and joints of right foot  Muscle weakness (generalized)  Other abnormalities of gait and mobility  Stiffness of right ankle, not elsewhere classified     Problem List Patient Active Problem List   Diagnosis Date Noted  . Posterior tibial tendon dysfunction (PTTD) of right lower extremity 02/24/2017     10:15 AM,03/20/17 Elly Modena PT, DPT [place] [facility] [place] [facility] [address], [place], [place], [address] Phone: [phone]   Fax:  [phone]  Name: Melvin [name] MRN: [id number] Date of Birth: 06/21/1945

## 2017-03-27 ENCOUNTER — Ambulatory Visit: Payer: BC Managed Care – PPO

## 2017-03-27 DIAGNOSIS — M6281 Muscle weakness (generalized): Secondary | ICD-10-CM

## 2017-03-27 DIAGNOSIS — M25671 Stiffness of right ankle, not elsewhere classified: Secondary | ICD-10-CM

## 2017-03-27 DIAGNOSIS — R2689 Other abnormalities of gait and mobility: Secondary | ICD-10-CM

## 2017-03-27 DIAGNOSIS — M25571 Pain in right ankle and joints of right foot: Secondary | ICD-10-CM | POA: Diagnosis not present

## 2017-03-27 NOTE — Therapy (Signed)
[facility] [address], [place], [place], [address] Phone: [phone]   Fax:  [phone]  Physical Therapy Treatment  Patient Details  Name: Melvin [name] MRN: [id number] Date of Birth: [birth year] Referring Provider: Eduard Roux, MD   Encounter Date: 03/27/2017  PT End of Session - 03/27/17 1016    Visit Number  8    Number of Visits  10    Date for PT Re-Evaluation  04/25/17    Authorization Type  G-codes at 10    PT Start Time  0935    PT Stop Time  1014    PT Time Calculation (min)  39 min    Activity Tolerance  Patient tolerated treatment well    Behavior During Therapy  [facility] for tasks assessed/performed       Past Medical History:  Diagnosis Date  . Cancer [facility])     Past Surgical History:  Procedure Laterality Date  . LYMPHADENECTOMY Bilateral 09/12/2012   Procedure: LYMPHADENECTOMY;  Surgeon: Alexis Frock, MD;  Location: [facility] ORS;  Service: Urology;  Laterality: Bilateral;  . ROBOT ASSISTED LAPAROSCOPIC RADICAL PROSTATECTOMY N/A 09/12/2012   Procedure: ROBOTIC ASSISTED LAPAROSCOPIC RADICAL PROSTATECTOMY;  Surgeon: Alexis Frock, MD;  Location: [facility] ORS;  Service: Urology;  Laterality: N/A;    There were no vitals filed for this visit.  Subjective Assessment - 03/27/17 0936    Subjective  I'm wearing my insoles.  No longer wearing boot.  I'm trying to increase my walking distance.      Patient Stated Goals  reduce ankle pain with standing and walking, wean from boot    Currently in Pain?  No/denies         [facility] PT Assessment - 03/27/17 0001      Observation/Other Assessments   Focus on Therapeutic Outcomes (FOTO)   31% limitation                  [facility] Adult PT Treatment/Exercise - 03/27/17 0001      Knee/Hip Exercises: Standing   Forward Step Up  Right;2 sets;10 reps using bosu    Rocker Board  3 minutes    Walking with Sports Cord  25# forward and reverse x 10 each      Ankle  Exercises: Seated   BAPS  Level 3;Sitting;Other (comment) x1 min CW/ x1 min CCW      Ankle Exercises: Standing   Other Standing Ankle Exercises  NBOS on foam 3x30 sec; tandem each LE forward 2x30 sec each               PT Short Term Goals - 03/27/17 0938      PT SHORT TERM GOAL #2   Title  report < or = to 2/10 ankle pain with walking in CAM boot    Status  Achieved        PT Long Term Goals - 03/27/17 0953      PT LONG TERM GOAL #3   Title  wean from boot on level surfaces and demonstrate symmetry with ambulation    Status  Achieved      PT LONG TERM GOAL #5   Title  return to walking and running for exercise without limitation    Time  8    Period  Weeks    Status  On-going            Plan - 03/27/17 0941    Clinical Impression Statement  Pt is now wearing shoes  with inserts full time.  FOTO is improved to 31% limitaiton. Pt is working to increase his walking distance.  Pt able to do yardwork and walking on incline/unlevel surfaces without difficulty.  Pt with incresaed difficulty with balance exercises.  Pt will complete this week and be placed on hold until he sees MD.      Rehab Potential  Good    PT Frequency  2x / week    PT Duration  8 weeks    PT Treatment/Interventions  ADLs/Self Care Home Management;Cryotherapy;Electrical Stimulation;Functional mobility training;Stair training;Gait training;Moist Heat;Therapeutic activities;Therapeutic exercise;Neuromuscular re-education;Patient/family education;Passive range of motion;Manual techniques;Dry needling;Taping    PT Next Visit Plan  Rt ankle flexibility/ROM, manual to medial aspect at tibialis tendon, hip and ankle strength, gait.  Measure Rt ankle inversion for STG    Consulted and Agree with Plan of Care  Patient       Patient will benefit from skilled therapeutic intervention in order to improve the following deficits and impairments:  Abnormal gait, Decreased range of motion, Postural dysfunction,  Decreased strength, Impaired flexibility, Pain, Decreased activity tolerance, Difficulty walking  Visit Diagnosis: Muscle weakness (generalized)  Other abnormalities of gait and mobility  Stiffness of right ankle, not elsewhere classified     Problem List Patient Active Problem List   Diagnosis Date Noted  . Posterior tibial tendon dysfunction (PTTD) of right lower extremity 02/24/2017    [name], PT 03/27/17 10:18 AM  [facility] [facility] [address] [place], [place], [address] Phone: [phone]   Fax:  [phone]  Name: Melvin [name] MRN: [id number] Date of Birth: October 23, 1945

## 2017-03-29 ENCOUNTER — Ambulatory Visit: Payer: BC Managed Care – PPO | Admitting: Physical Therapy

## 2017-03-29 DIAGNOSIS — M6281 Muscle weakness (generalized): Secondary | ICD-10-CM

## 2017-03-29 DIAGNOSIS — M25571 Pain in right ankle and joints of right foot: Secondary | ICD-10-CM | POA: Diagnosis not present

## 2017-03-29 DIAGNOSIS — M25671 Stiffness of right ankle, not elsewhere classified: Secondary | ICD-10-CM

## 2017-03-29 DIAGNOSIS — R2689 Other abnormalities of gait and mobility: Secondary | ICD-10-CM

## 2017-03-29 NOTE — Therapy (Addendum)
[facility] [address] [place], [place], [address] Phone: [phone]   Fax:  [phone]  Physical Therapy Treatment/Discharge  Patient Details  Name: Melvin [name] MRN: [id number] Date of Birth: 05-May-1945 Referring Provider: Eduard Roux, MD   Encounter Date: 03/29/2017  PT End of Session - 03/29/17 1238    Visit Number  9    Number of Visits  10    Date for PT Re-Evaluation  04/25/17    Authorization Type  G-codes at 10    PT Start Time  0931    PT Stop Time  3235    PT Time Calculation (min)  43 min    Activity Tolerance  Patient tolerated treatment well;No increased pain    Behavior During Therapy  WFL for tasks assessed/performed       Past Medical History:  Diagnosis Date  . Cancer [facility])     Past Surgical History:  Procedure Laterality Date  . LYMPHADENECTOMY Bilateral 09/12/2012   Procedure: LYMPHADENECTOMY;  Surgeon: Alexis Frock, MD;  Location: [facility];  Service: Urology;  Laterality: Bilateral;  . ROBOT ASSISTED LAPAROSCOPIC RADICAL PROSTATECTOMY N/A 09/12/2012   Procedure: ROBOTIC ASSISTED LAPAROSCOPIC RADICAL PROSTATECTOMY;  Surgeon: Alexis Frock, MD;  Location: [facility];  Service: Urology;  Laterality: N/A;    There were no vitals filed for this visit.  Subjective Assessment - 03/29/17 0934    Subjective  Pt reports things are going well. He is doing more walking now, but not quite at the level he was before. He has no issues with the walking.     Patient Stated Goals  reduce ankle pain with standing and walking, wean from boot    Currently in Pain?  No/denies         [facility] PT Assessment - 03/29/17 0001      Strength   Overall Strength Comments  Rt ankle DF/inversion/eversion 5/5, Rt gastroc unable to complete 1 single leg heel raise                   [facility] Adult PT Treatment/Exercise - 03/29/17 0001      Ankle Exercises: Seated   BAPS  Level 3;Sitting;Other (comment) x1  min CW; x1 min CCW    Other Seated Ankle Exercises  Rt ankle PF with double red TB x20, increased to green TB x20 reps     Other Seated Ankle Exercises  Rt ankle Inversion/Eversion windshild wipers x15 reps.       Ankle Exercises: Standing   Other Standing Ankle Exercises  arch squeezes 15x3 sec each; NBOS on foam with trunk rotation holding orange weighted ball x10 reps Lt/Rt    Other Standing Ankle Exercises  tandem stepping x5 reps, 4 trials, CGA             PT Education - 03/29/17 0949    Education provided  Yes    Education Details  encouraged pt to call MD regarding questions concerning his pain medication; discussed placing pt on hold until he speaks with the MD and discussed reasons for him to continue with therapy to address remaining limitations in ROM and strength    Person(s) Educated  Patient    Methods  Explanation    Comprehension  Verbalized understanding       PT Short Term Goals - 03/29/17 0935      PT SHORT TERM GOAL #1   Title  be independent in inital HEP    Time  4  Period  Weeks    Status  Achieved      PT SHORT TERM GOAL #2   Title  report < or = to 2/10 ankle pain with walking in CAM boot    Time  4    Period  Weeks    Status  Achieved      PT SHORT TERM GOAL #3   Title  demonstrate 4-/5 Rt ankle inversion strength to improve ankle stability    Baseline  4/5    Time  4    Period  Weeks    Status  Achieved        PT Long Term Goals - 03/29/17 4709      PT LONG TERM GOAL #1   Title  be independent in advanced HEP    Time  8    Status  Achieved      PT LONG TERM GOAL #2   Title  reduce FOTO to < or = to 27% limitation    Baseline  31% limited     Time  8    Period  Weeks    Status  Partially Met      PT LONG TERM GOAL #3   Title  wean from boot on level surfaces and demonstrate symmetry with ambulation    Time  8    Period  Weeks    Status  Achieved      PT LONG TERM GOAL #4   Title  demonstrate 4 to 4+/5 Rt ankle strength  to improve stability with walking and running    Baseline  ankle strength 4/5 or greater, except ankle plantar flexion unable to complete 1 heel raise in standing     Time  8    Period  Weeks    Status  On-going      PT LONG TERM GOAL #5   Title  return to walking and running for exercise without limitation    Baseline  walking 50% level     Time  8    Period  Weeks    Status  Partially Met            Plan - 03/29/17 1129    Clinical Impression Statement  Pt is making steady progress towards his goals, noting improved mobility and strength since beginning PT. He reports no increase in pain over the past couple of weeks and reports no change in his pain with wearing his shoe inserts. Pt does continue to demonstrate limitations in gastroc strength, ankle ROM (active and passive) and his intrinsic foot strength and proprioception is lacking, which places him at an increased risk of return of pain/symptoms. He is returning to his referring physician next week to discuss progress and has requested to be placed on hold until he speaks with them.     Rehab Potential  Good    PT Frequency  2x / week    PT Duration  8 weeks    PT Treatment/Interventions  ADLs/Self Care Home Management;Cryotherapy;Electrical Stimulation;Functional mobility training;Stair training;Gait training;Moist Heat;Therapeutic activities;Therapeutic exercise;Neuromuscular re-education;Patient/family education;Passive range of motion;Manual techniques;Dry needling;Taping    PT Next Visit Plan  Rt ankle flexibility/ROM, gastroc strength, intrinsic foot strength and proprioception, hip strength    Consulted and Agree with Plan of Care  Patient       Patient will benefit from skilled therapeutic intervention in order to improve the following deficits and impairments:  Abnormal gait, Decreased range of motion, Postural dysfunction, Decreased strength, Impaired flexibility, Pain,  Decreased activity tolerance, Difficulty  walking  Visit Diagnosis: Muscle weakness (generalized)  Other abnormalities of gait and mobility  Stiffness of right ankle, not elsewhere classified  Pain in right ankle and joints of right foot     Problem List Patient Active Problem List   Diagnosis Date Noted  . Posterior tibial tendon dysfunction (PTTD) of right lower extremity 02/24/2017    12:41 PM,03/29/17 Elly Modena PT, DPT [place] at [place] [facility] [address] [place], [place], [address] Phone: [phone]   Fax:  [phone]  Name: Daveon Arpino MRN: [id number] Date of Birth: 11-Mar-1946   *addendum to resolve episode of care and d/c pt from [place]  Visits from Start of Care: 9  Current functional level related to goals / functional outcomes: See above for more details    Remaining deficits: See above for more details    Education / Equipment: See above for more details  PT called and spoke with pt on 05/09/17. Pt states things are going well and he would like to be discharged from PT. Plan: Patient agrees to discharge.  Patient goals were partially met. Patient is being discharged due to being pleased with the current functional level.  ?????    11:51 AM,05/09/17 [place], [place] at [place]

## 2017-03-29 NOTE — Patient Instructions (Signed)
   Ankle Plantarflexion  Start: Place theraband around involved foot as pictured.  Movement: Press into the band against the resistance (as if you are pressing the gas pedal) control the movement back to start.  x15-20 reps    [facility] [address], [place], [place] [address] Phone # [phone] Fax [phone]

## 2017-04-05 NOTE — Progress Notes (Signed)
[name] [address] [place], [place] [address] Phone: [phone] Subjective:      CC: Foot and ankle pain  QMV:[id number]  Melvin [name] is a 71 y.o. male coming in with complaint of foot and ankle pain.  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  Was to do home exercise, icing regimen, we did inject the posterior tendon sheath on March 09, 2017.  Patient states doing 95% better after the injections.  Discussed with patient icing regimen and home exercises.  We discussed which activities of doing which wants to avoid but has been doing very regularly.  Has done very well with physical therapy.  Patient is happy with the results.     Past Medical History:  Diagnosis Date  . Cancer [facility])    Past Surgical History:  Procedure Laterality Date  . LYMPHADENECTOMY Bilateral 09/12/2012   Procedure: LYMPHADENECTOMY;  Surgeon: Alexis Frock, MD;  Location: [facility];  Service: Urology;  Laterality: Bilateral;  . ROBOT ASSISTED LAPAROSCOPIC RADICAL PROSTATECTOMY N/A 09/12/2012   Procedure: ROBOTIC ASSISTED LAPAROSCOPIC RADICAL PROSTATECTOMY;  Surgeon: Alexis Frock, MD;  Location: [facility];  Service: Urology;  Laterality: N/A;   Social History   Socioeconomic History  . Marital status: Divorced    Spouse name: None  . Number of children: None  . Years of education: None  . Highest education level: None  Social Needs  . Financial resource strain: None  . Food insecurity - worry: None  . Food insecurity - inability: None  . Transportation needs - medical: None  . Transportation needs - non-medical: None  Occupational History  . None  Tobacco Use  . Smoking status: Former Smoker    Last attempt to quit: 09/12/1992    Years since quitting: 24.5  Substance and Sexual Activity  . Alcohol use: None  . Drug use: None  . Sexual activity: None  Other Topics Concern  . None  Social History Narrative  . None   Allergies  Allergen  Reactions  . Sulfa Antibiotics Hives   No family history on file.  No family history of autoimmune disease   Past medical history, social, surgical and family history all reviewed in electronic medical record.  No pertanent information unless stated regarding to the chief complaint.   Review of Systems:Review of systems updated and as accurate as of 04/06/17  No headache, visual changes, nausea, vomiting, diarrhea, constipation, dizziness, abdominal pain, skin rash, fevers, chills, night sweats, weight loss, swollen lymph nodes, body aches, joint swelling, muscle aches, chest pain, shortness of breath, mood changes.   Objective  Blood pressure 130/84, pulse 74, height 6\' 2"  (1.88 m), weight 214 lb (97.1 kg), SpO2 98 %. Systems examined below as of 04/06/17   General: No apparent distress alert and oriented x3 mood and affect normal, dressed appropriately.  HEENT: Pupils equal, extraocular movements intact  Respiratory: Patient's speak in full sentences and does not appear short of breath  Cardiovascular: No lower extremity edema, non tender, no erythema  Skin: Warm dry intact with no signs of infection or rash on extremities or on axial skeleton.  Abdomen: Soft nontender  Neuro: Cranial nerves II through XII are intact, neurovascularly intact in all extremities with 2+ DTRs and 2+ pulses.  Lymph: No lymphadenopathy of posterior or anterior cervical chain or axillae bilaterally.  Gait normal with good balance and coordination.  MSK:  Non tender with full range of motion and good stability and symmetric strength and  tone of shoulders, elbows, wrist, hip, knee and ankles bilaterally.  Mild arthritic changes of multiple joints  Pes planus bilaterally with over pronation of the hindfoot bilaterally right greater than left.  Patient does have breakdown the transverse arch as well.  Hallux rigidus noted bilaterally.  Nontender over the posterior tibialis tendon    Impression and  Recommendations:     This case required medical decision making of moderate complexity.      Note: This dictation was prepared with Dragon dictation along with smaller phrase technology. Any transcriptional errors that result from this process are unintentional.

## 2017-04-06 ENCOUNTER — Ambulatory Visit (INDEPENDENT_AMBULATORY_CARE_PROVIDER_SITE_OTHER): Payer: BC Managed Care – PPO | Admitting: Family Medicine

## 2017-04-06 ENCOUNTER — Encounter: Payer: Self-pay | Admitting: Family Medicine

## 2017-04-06 VITALS — BP 130/84 | HR 74 | Ht 74.0 in | Wt 214.0 lb

## 2017-04-06 DIAGNOSIS — M76821 Posterior tibial tendinitis, right leg: Secondary | ICD-10-CM | POA: Diagnosis not present

## 2017-04-06 NOTE — Patient Instructions (Signed)
Good to see you  I am so glad you are doing better  We will get you in with rigby to discuss custom orthotics Other shoes to consider HOKA arahi or new balance 990 Continue the exercises 3-5 times a week for at least another month.  Ice and pennsaid when needed See me again in 2 months Happy New Year!

## 2017-04-06 NOTE — Assessment & Plan Note (Signed)
Patient has significant insufficiency of the posterior tibialis tendon bilaterally with pes planus.  I do feel that custom orthotics will be beneficial and secondary to patient's arthritic changes as well as body habitus EVA would be more comfortable for patient will be referred to a another sports medicine physician to have these appropriately designed.  Discussed continuing home exercise, icing regimen, topical anti-inflammatories and the over-the-counter medications.  Follow-up with me again in 2 months

## 2017-04-14 ENCOUNTER — Ambulatory Visit: Payer: BC Managed Care – PPO | Admitting: Sports Medicine

## 2017-05-05 ENCOUNTER — Ambulatory Visit: Payer: BC Managed Care – PPO | Admitting: Sports Medicine

## 2017-05-09 ENCOUNTER — Encounter: Payer: Self-pay | Admitting: Sports Medicine

## 2017-05-09 ENCOUNTER — Telehealth: Payer: Self-pay | Admitting: Physical Therapy

## 2017-05-09 ENCOUNTER — Ambulatory Visit: Payer: BC Managed Care – PPO | Admitting: Sports Medicine

## 2017-05-09 VITALS — BP 120/78 | HR 81 | Ht 74.0 in | Wt 213.6 lb

## 2017-05-09 DIAGNOSIS — M76821 Posterior tibial tendinitis, right leg: Secondary | ICD-10-CM | POA: Diagnosis not present

## 2017-05-09 DIAGNOSIS — R269 Unspecified abnormalities of gait and mobility: Secondary | ICD-10-CM

## 2017-05-09 NOTE — Procedures (Signed)
PROCEDURE: CUSTOM ORTHOTIC FABRICATION Patient's underlying musculoskeletal conditions are directly related to poor biomechanics and will benefit from a functional custom orthotic.  There are no significant foot deformities that complicate the use of a custom orthotic.  The patient was fitted for a standard, cushioned, semi-rigid orthotic. The orthotic was heated & placed on the orthotic stand. The patient was positioned in subtalar neutral position and 10 of ankle dorsiflexion and weight bearing stance on the heated orthotic blank. After completion of the molding a base was applied to the orthotic blank. The orthotic was ground to a stable position for weightbearing. The patient ambulated in these and reported they were comfortable without pressure spots.              BLANK:  Size 12 - Standard Cushioned                 BASE:  Blue EVA      POSTINGS:  n/a

## 2017-05-09 NOTE — Telephone Encounter (Signed)
Spoke with pt regarding progress, and he states things are going well. He is requesting to be discharged from PT at this time.    11:45 AM,05/09/17 [place], [place] at [facility]

## 2017-05-09 NOTE — Progress Notes (Signed)
Melvin [name]. Melvin [name], [place] at [place]  Melvin [name] - 72 y.o. male MRN [id number]  Date of birth: 01-07-1946  Visit Date: 05/09/2017  PCP: Melvin Melter, MD   Referred by: Melvin Pulley, DO   Scribe for today's visit: Melvin [name], CMA     SUBJECTIVE:  Melvin [name] is here for No chief complaint on file. Marland Kitchen  Referred by: Melvin Saas, DO Compared to the last office visit, his previously described symptoms are improving. Current symptoms are mild & are non-radiating. He has been doing home exercises with no trouble. He is not back to regular walking schedule but he is "getting there", about 1.5 miles in the mornings. He has been taking oral Diclofenac and using topical Diclofenac prn for the pain with some relief. He has received steroid injection with Dr. Tamala [name] in the past. He has tried a pair of orthotics that he ordered online but he thinks that they feel too hard.  He has hx of plantar fasciitis.    ROS Denies night time disturbances. Denies fevers, chills, or night sweats. Denies unexplained weight loss. Denies personal history of cancer. Denies changes in bowel or bladder habits. Denies recent unreported falls. Denies new or worsening dyspnea or wheezing. Denies headaches or dizziness.  Denies numbness, tingling or weakness  In the extremities.  Denies dizziness or presyncopal episodes Denies lower extremity edema     HISTORY & PERTINENT PRIOR DATA:  Prior History reviewed and updated per electronic medical record.  Significant history, findings, studies and interim changes include:  reports that he quit smoking about 24 years ago. he has never used smokeless tobacco. No results for input(s): HGBA1C, LABURIC, CREATINE in the last 8760 hours. No specialty comments available. No problems updated.  OBJECTIVE:  VS:  HT:6\' 2"  (188 cm)   WT:213 lb 9.6 oz (96.9 kg)  BMI:27.41    BP:120/78  HR:81bpm  TEMP:  ( )  RESP:96 %   PHYSICAL EXAM: Constitutional: WDWN, Non-toxic appearing. Psychiatric: Alert & appropriately interactive. Not depressed or anxious appearing. Respiratory: No increased work of breathing. Trachea Midline Eyes: Pupils are equal. EOM intact without nystagmus. No scleral icterus Cardiovascular:  Peripheral Pulses: peripheral pulses symmetrical No clubbing or cyanosis appreciated Capillary Refill is normal, less than 2 seconds No signficant generalized edema/anasarca Sensory Exam: intact to light touch  Right foot has significant lateral deviation with markedly flat longitudinal arch and Morton's callus. He has mild pain with palpation of the posterior tibial tendon however this is minimal.  Poor posterior tibialis recruitment with heel raise.  Gait does improve with orthotics.      ASSESSMENT & PLAN:   1. Posterior tibial tendon dysfunction (PTTD) of right lower extremity   2. Gait disturbance    PLAN: Custom orthotics created today per procedure note.  He had overall good improvement in his gait and comfort with these.  We will plan to have him follow-up with Dr. Tamala [name] as previously scheduled.  If any issues with the orthotics he is welcome to call us and return for modifications.  ++++++++++++++++++++++++++++++++++++++++++++ Follow-up: No Follow-up on file.   Pertinent documentation may be included in additional procedure notes, imaging studies, problem based documentation and patient instructions. Please see these sections of the encounter for additional information regarding this visit. CMA/ATC served as Education administrator during this visit. History, Physical, and Plan performed by medical provider. Documentation and orders reviewed and attested to.      Melvin [name]  Melvin [name] Sports Medicine Physician

## 2017-05-09 NOTE — Patient Instructions (Signed)
Look into getting Hoka running shoes.  These are available at [employer].

## 2017-06-06 NOTE — Progress Notes (Signed)
[name] [place], [place] [address] Phone: [phone] Subjective:    I'm seeing this patient by the request  of:    CC: Foot pain follow-up  RDE:[id number]  Melvin [name] is a 72 y.o. male coming in with complaint of foot pain.  Found to have severe pes planus bilaterally. He states that he has been doing good. He joined the gym and has been doing a lot of walking and has not been having pain. He uses pennsaid and states that it worked. No pain with the increase in activity.       Past Medical History:  Diagnosis Date  . Cancer [facility])    Past Surgical History:  Procedure Laterality Date  . LYMPHADENECTOMY Bilateral 09/12/2012   Procedure: LYMPHADENECTOMY;  Surgeon: Alexis Frock, MD;  Location: [facility];  Service: Urology;  Laterality: Bilateral;  . ROBOT ASSISTED LAPAROSCOPIC RADICAL PROSTATECTOMY N/A 09/12/2012   Procedure: ROBOTIC ASSISTED LAPAROSCOPIC RADICAL PROSTATECTOMY;  Surgeon: Alexis Frock, MD;  Location: [facility];  Service: Urology;  Laterality: N/A;   Social History   Socioeconomic History  . Marital status: Divorced    Spouse name: Not on file  . Number of children: Not on file  . Years of education: Not on file  . Highest education level: Not on file  Social Needs  . Financial resource strain: Not on file  . Food insecurity - worry: Not on file  . Food insecurity - inability: Not on file  . Transportation needs - medical: Not on file  . Transportation needs - non-medical: Not on file  Occupational History  . Not on file  Tobacco Use  . Smoking status: Former Smoker    Last attempt to quit: 09/12/1992    Years since quitting: 24.7  . Smokeless tobacco: Never Used  Substance and Sexual Activity  . Alcohol use: Not on file  . Drug use: Not on file  . Sexual activity: Not on file  Other Topics Concern  . Not on file  Social History Narrative  . Not on file   Allergies  Allergen Reactions  . Sulfa  Antibiotics Hives   No family history on file.   Past medical history, social, surgical and family history all reviewed in electronic medical record.  No pertanent information unless stated regarding to the chief complaint.   Review of Systems:Review of systems updated and as accurate as of 06/06/17  No headache, visual changes, nausea, vomiting, diarrhea, constipation, dizziness, abdominal pain, skin rash, fevers, chills, night sweats, weight loss, swollen lymph nodes, body aches, joint swelling, muscle aches, chest pain, shortness of breath, mood changes.   Objective  There were no vitals taken for this visit. Systems examined below as of 06/06/17   General: No apparent distress alert and oriented x3 mood and affect normal, dressed appropriately.  HEENT: Pupils equal, extraocular movements intact  Respiratory: Patient's speak in full sentences and does not appear short of breath  Cardiovascular: No lower extremity edema, non tender, no erythema  Skin: Warm dry intact with no signs of infection or rash on extremities or on axial skeleton.  Abdomen: Soft nontender  Neuro: Cranial nerves II through XII are intact, neurovascularly intact in all extremities with 2+ DTRs and 2+ pulses.  Lymph: No lymphadenopathy of posterior or anterior cervical chain or axillae bilaterally.  Gait mild antalgic gait MSK:  Non tender with full range of motion and good stability and symmetric strength and tone of shoulders, elbows, wrist,  hip, knee and ankles bilaterally.  Mild arthritic changes of multiple joints Patient foot exam shows severe pes planus bilaterally right greater than left.  Still significant posterior tibialis insufficiency on the right side.  Neurovascularly intact distally.  Full range of motion of the ankle.  Negative pain over the navicular bone.   Impression and Recommendations:     This case required medical decision making of moderate complexity.      Note: This dictation was  prepared with Dragon dictation along with smaller phrase technology. Any transcriptional errors that result from this process are unintentional.

## 2017-06-07 ENCOUNTER — Encounter: Payer: Self-pay | Admitting: Family Medicine

## 2017-06-07 ENCOUNTER — Ambulatory Visit: Payer: BC Managed Care – PPO | Admitting: Family Medicine

## 2017-06-07 DIAGNOSIS — M76821 Posterior tibial tendinitis, right leg: Secondary | ICD-10-CM | POA: Diagnosis not present

## 2017-06-07 MED ORDER — DICLOFENAC SODIUM 75 MG PO TBEC: 75.0000 mg | DELAYED_RELEASE_TABLET | Freq: Two times a day (BID) | ORAL | 2 refills | Status: DC

## 2017-06-07 NOTE — Patient Instructions (Signed)
Good to see you  Ice is your friend when needed pennsaid pinkie amount topically 2 times daily as needed.  Continue the exercises 2 times a week if you can  Write me with questions See me again when you need me

## 2017-06-07 NOTE — Assessment & Plan Note (Signed)
Patient is doing really well.  Doing well with patient as long as patient is well he can see me as needed.

## 2017-10-28 NOTE — Progress Notes (Signed)
[name] [place], [place] [address] Phone: [phone] Subjective:     CC: Back pain, new problem,  EUM:[id number]  Aladdin Kollmann is a 72 y.o. male coming in with complaint of back pain. Pain radiates down his left leg into the calf. Pain is dependant on physical activity. No numbness and tingling noted. When the pain flares up he can barely walk.   Onset- 3-4 weeks ago Location- Lower back  Duration-can last couple days and then seems to resolve Character- Dull Aggravating factors- physical activity, after exercise Reliving factors-seems to be only time Therapies tried- [employer] it occurs 7 out of 10     Past Medical History:  Diagnosis Date  . Cancer [facility])    Past Surgical History:  Procedure Laterality Date  . LYMPHADENECTOMY Bilateral 09/12/2012   Procedure: LYMPHADENECTOMY;  Surgeon: Alexis Frock, MD;  Location: [facility];  Service: Urology;  Laterality: Bilateral;  . ROBOT ASSISTED LAPAROSCOPIC RADICAL PROSTATECTOMY N/A 09/12/2012   Procedure: ROBOTIC ASSISTED LAPAROSCOPIC RADICAL PROSTATECTOMY;  Surgeon: Alexis Frock, MD;  Location: [facility];  Service: Urology;  Laterality: N/A;   Social History   Socioeconomic History  . Marital status: Divorced    Spouse name: Not on file  . Number of children: Not on file  . Years of education: Not on file  . Highest education level: Not on file  Occupational History  . Not on file  Social Needs  . Financial resource strain: Not on file  . Food insecurity:    Worry: Not on file    Inability: Not on file  . Transportation needs:    Medical: Not on file    Non-medical: Not on file  Tobacco Use  . Smoking status: Former Smoker    Last attempt to quit: 09/12/1992    Years since quitting: 25.1  . Smokeless tobacco: Never Used  Substance and Sexual Activity  . Alcohol use: Not on file  . Drug use: Not on file  . Sexual activity: Not on file  Lifestyle  . Physical  activity:    Days per week: Not on file    Minutes per session: Not on file  . Stress: Not on file  Relationships  . Social connections:    Talks on phone: Not on file    Gets together: Not on file    Attends religious service: Not on file    Active member of club or organization: Not on file    Attends meetings of clubs or organizations: Not on file    Relationship status: Not on file  Other Topics Concern  . Not on file  Social History Narrative  . Not on file   Allergies  Allergen Reactions  . Sulfa Antibiotics Hives   No family history on file.   Past medical history, social, surgical and family history all reviewed in electronic medical record.  No pertanent information unless stated regarding to the chief complaint.   Review of Systems:Review of systems updated and as accurate as of 10/30/17  No headache, visual changes, nausea, vomiting, diarrhea, constipation, dizziness, abdominal pain, skin rash, fevers, chills, night sweats, weight loss, swollen lymph nodes, body aches, joint swelling,  chest pain, shortness of breath, mood changes.  Positive muscle aches  Objective  Blood pressure (!) 142/70, pulse 65, height 6\' 2"  (1.88 m), weight 204 lb (92.5 kg), SpO2 97 %. Systems examined below as of 10/30/17   General: No apparent distress alert and oriented x3  mood and affect normal, dressed appropriately.  HEENT: Pupils equal, extraocular movements intact  Respiratory: Patient's speak in full sentences and does not appear short of breath  Cardiovascular: No lower extremity edema, non tender, no erythema  Skin: Warm dry intact with no signs of infection or rash on extremities or on axial skeleton.  Abdomen: Soft nontender  Neuro: Cranial nerves II through XII are intact, neurovascularly intact in all extremities with 2+ DTRs and 2+ pulses.  Lymph: No lymphadenopathy of posterior or anterior cervical chain or axillae bilaterally.  Gait normal with good balance and  coordination.  MSK:  Non tender with full range of motion and good stability and symmetric strength and tone of shoulders, elbows, wrist, hip, knee and ankles bilaterally.  Severe overpronation of the feet bilaterally Back Exam:  Inspection: Loss of lordosis with some mild degenerative scoliosis Motion: Flexion 35 deg, Extension 25 deg, Side Bending to 35 deg bilaterally,  Rotation to 30 deg bilaterally  SLR laying: Negative  XSLR laying: Negative  Palpable tenderness: Tender to palpation the paraspinal musculature lumbar spine left greater than right. FABER: Positive Faber on the left. Sensory change: Gross sensation intact to all lumbar and sacral dermatomes.  Reflexes: 2+ at both patellar tendons, 2+ at achilles tendons, Babinski's downgoing.  Strength at foot  4+ out of 5 but symmetric  [id number]; 15 additional minutes spent for Therapeutic exercises as stated in above notes.  This included exercises focusing on stretching, strengthening, with significant focus on eccentric aspects.   Long term goals include an improvement in range of motion, strength, endurance as well as avoiding reinjury. Patient's frequency would include in 1-2 times a day, 3-5 times a week for a duration of 6-12 weeks.   Low back exercises that included:  Pelvic tilt/bracing instruction to focus on control of the pelvic girdle and lower abdominal muscles  Glute strengthening exercises, focusing on proper firing of the glutes without engaging the low back muscles Proper stretching techniques for maximum relief for the hamstrings, hip flexors, low back and some rotation where tolerated  Proper technique shown and discussed handout in great detail with ATC.  All questions were discussed and answered.        Impression and Recommendations:     This case required medical decision making of moderate complexity.      Note: This dictation was prepared with Dragon dictation along with smaller phrase technology. Any  transcriptional errors that result from this process are unintentional.

## 2017-10-30 ENCOUNTER — Encounter: Payer: Self-pay | Admitting: Family Medicine

## 2017-10-30 ENCOUNTER — Ambulatory Visit: Payer: BC Managed Care – PPO | Admitting: Family Medicine

## 2017-10-30 DIAGNOSIS — M545 Low back pain, unspecified: Secondary | ICD-10-CM | POA: Insufficient documentation

## 2017-10-30 DIAGNOSIS — M5442 Lumbago with sciatica, left side: Secondary | ICD-10-CM

## 2017-10-30 DIAGNOSIS — G8929 Other chronic pain: Secondary | ICD-10-CM

## 2017-10-30 NOTE — Assessment & Plan Note (Signed)
Intermittent pain with sciatica type symptoms.  No significant weakness. Negative straight leg test today.  I believe that most of this is secondary to muscle cramping and poor core stability.  Home exercises given and patient work with Product/process development scientist.  Discussed icing regimen, topical anti-inflammatories, patient has oral anti-inflammatories for breakthrough pain.  Patient likely will do well with conservative therapy and follow-up again in 4 weeks

## 2017-10-30 NOTE — Patient Instructions (Signed)
Good to see you  [name] is your friend. Ice 20 minutes 2 times daily. Usually after activity and before bed. Exercises 3 times a week.  When in pain Diclofenac 2 times a day for 3 days Turmeric 500mg  daily  Stay active but drop weight 50%  See me again in 4 weeks

## 2017-11-10 ENCOUNTER — Ambulatory Visit: Payer: BC Managed Care – PPO | Admitting: Family Medicine

## 2017-11-25 NOTE — Progress Notes (Signed)
[name] [address] [place], [place] [address] Phone: [phone] Subjective:    I'm seeing this patient by the request  of:    CC: Back pain follow-up  [id number]  Melvin [name] is a 72 y.o. male coming in with complaint of back pain. He has not had intense pain for 3 weeks. He feels much better. Has been doing the exercises which he feels is helping. Complains of soreness in the lumbar spine. Sciatic nerve pain has dissipated.  Patient was having some lumbar radiculopathy  Patient states that he has not had an attack since last seen him.  Patient denies any fevers chills or any abnormal weight loss.  Still has some discomfort in his back but icing seems to be doing relatively well.  Taking some over-the-counter medications.  No side effects to any other medicines and feel that the topical anti-inflammatory has been helpful.  Overall 75 to 80% better  Past Medical History:  Diagnosis Date  . Cancer [facility])    Past Surgical History:  Procedure Laterality Date  . LYMPHADENECTOMY Bilateral 09/12/2012   Procedure: LYMPHADENECTOMY;  Surgeon: Alexis Frock, MD;  Location: [facility];  Service: Urology;  Laterality: Bilateral;  . ROBOT ASSISTED LAPAROSCOPIC RADICAL PROSTATECTOMY N/A 09/12/2012   Procedure: ROBOTIC ASSISTED LAPAROSCOPIC RADICAL PROSTATECTOMY;  Surgeon: Alexis Frock, MD;  Location: [facility];  Service: Urology;  Laterality: N/A;   Social History   Socioeconomic History  . Marital status: Divorced    Spouse name: Not on file  . Number of children: Not on file  . Years of education: Not on file  . Highest education level: Not on file  Occupational History  . Not on file  Social Needs  . Financial resource strain: Not on file  . Food insecurity:    Worry: Not on file    Inability: Not on file  . Transportation needs:    Medical: Not on file    Non-medical: Not on file  Tobacco Use  . Smoking status: Former Smoker    Last attempt to quit:  09/12/1992    Years since quitting: 25.2  . Smokeless tobacco: Never Used  Substance and Sexual Activity  . Alcohol use: Not on file  . Drug use: Not on file  . Sexual activity: Not on file  Lifestyle  . Physical activity:    Days per week: Not on file    Minutes per session: Not on file  . Stress: Not on file  Relationships  . Social connections:    Talks on phone: Not on file    Gets together: Not on file    Attends religious service: Not on file    Active member of club or organization: Not on file    Attends meetings of clubs or organizations: Not on file    Relationship status: Not on file  Other Topics Concern  . Not on file  Social History Narrative  . Not on file   Allergies  Allergen Reactions  . Sulfa Antibiotics Hives   No family history on file.  No family history of autoimmune   Past medical history, social, surgical and family history all reviewed in electronic medical record.  No pertanent information unless stated regarding to the chief complaint.   Review of Systems:Review of systems updated and as accurate as of 11/27/17  No headache, visual changes, nausea, vomiting, diarrhea, constipation, dizziness, abdominal pain, skin rash, fevers, chills, night sweats, weight loss, swollen lymph nodes, body aches,  joint swelling, muscle aches, chest pain, shortness of breath, mood changes.   Objective  Blood pressure 126/88, pulse 77, height 6\' 2"  (1.88 m), weight 202 lb (91.6 kg), SpO2 98 %. Systems examined below as of 11/27/17   General: No apparent distress alert and oriented x3 mood and affect normal, dressed appropriately.  HEENT: Pupils equal, extraocular movements intact  Respiratory: Patient's speak in full sentences and does not appear short of breath  Cardiovascular: No lower extremity edema, non tender, no erythema  Skin: Warm dry intact with no signs of infection or rash on extremities or on axial skeleton.  Abdomen: Soft nontender  Neuro: Cranial  nerves II through XII are intact, neurovascularly intact in all extremities with 2+ DTRs and 2+ pulses.  Lymph: No lymphadenopathy of posterior or anterior cervical chain or axillae bilaterally.  Gait normal with good balance and coordination.  MSK:  Non tender with full range of motion and good stability and symmetric strength and tone of shoulders, elbows, wrist, hip, knee and ankles bilaterally. Arthritic changes noted.  Back exam shows loss of lordosis.  Patient's left sacroiliac joint has some tenderness.  Patient does have a lipoma noted over the L4-L5 paraspinal musculature that is moderately tender but does move relatively well.  Negative straight leg test.  5 out of 5 strength in lower extremities that is symmetric deep tendon reflexes intact.    Impression and Recommendations:     This case required medical decision making of moderate complexity.      Note: This dictation was prepared with Dragon dictation along with smaller phrase technology. Any transcriptional errors that result from this process are unintentional.

## 2017-11-27 ENCOUNTER — Encounter: Payer: Self-pay | Admitting: Family Medicine

## 2017-11-27 ENCOUNTER — Other Ambulatory Visit: Payer: BC Managed Care – PPO

## 2017-11-27 ENCOUNTER — Ambulatory Visit: Payer: BC Managed Care – PPO | Admitting: Family Medicine

## 2017-11-27 ENCOUNTER — Ambulatory Visit (INDEPENDENT_AMBULATORY_CARE_PROVIDER_SITE_OTHER)
Admission: RE | Admit: 2017-11-27 | Discharge: 2017-11-27 | Disposition: A | Discharge status: Home / Self Care | Payer: BC Managed Care – PPO | Source: Ambulatory Visit | Attending: Family Medicine | Admitting: Family Medicine

## 2017-11-27 VITALS — BP 126/88 | HR 77 | Ht 74.0 in | Wt 202.0 lb

## 2017-11-27 DIAGNOSIS — M549 Dorsalgia, unspecified: Secondary | ICD-10-CM | POA: Diagnosis not present

## 2017-11-27 DIAGNOSIS — G8929 Other chronic pain: Secondary | ICD-10-CM

## 2017-11-27 DIAGNOSIS — M5442 Lumbago with sciatica, left side: Secondary | ICD-10-CM

## 2017-11-27 NOTE — Patient Instructions (Signed)
Good to see you  [name] is your friend.  Stay active pennsaid pinkie amount topically 2 times daily as needed.  Or when you need it My exercises split them up and do 3 of the exercise a day See me again in 2-3 months!

## 2017-11-27 NOTE — Assessment & Plan Note (Signed)
Patient does have low back pain but seems to be improving with conservative therapy.  With patient having a history of prostate cancer and so x-rays have been ordered today.  No fevers chills or any abnormal weight loss.  Has been responding well to conservative therapy.  No significant changes and follow-up again in 3 months

## 2018-01-28 NOTE — Progress Notes (Signed)
[name] [address], [place] [address] Phone: [phone] Subjective:   [name], am serving as a scribe for Dr. Hulan Saas.   CC: Bilateral foot pain  [id number]  Melvin [name] is a 72 y.o. male coming in with complaint of back pain. He has not had any problems with sciatic nerve pain since last visit. Has been exercising without pain. Does ice occasionally and uses pennsaid. His ankle has also been feeling good with all of the activity that he does daily.  Overall feels like he is making improvement.  States that the pain is not as severe. Denies any numbness or tingling.     Past Medical History:  Diagnosis Date  . Cancer [facility])    Past Surgical History:  Procedure Laterality Date  . LYMPHADENECTOMY Bilateral 09/12/2012   Procedure: LYMPHADENECTOMY;  Surgeon: Alexis Frock, MD;  Location: [facility];  Service: Urology;  Laterality: Bilateral;  . ROBOT ASSISTED LAPAROSCOPIC RADICAL PROSTATECTOMY N/A 09/12/2012   Procedure: ROBOTIC ASSISTED LAPAROSCOPIC RADICAL PROSTATECTOMY;  Surgeon: Alexis Frock, MD;  Location: [facility];  Service: Urology;  Laterality: N/A;   Social History   Socioeconomic History  . Marital status: Divorced    Spouse name: Not on file  . Number of children: Not on file  . Years of education: Not on file  . Highest education level: Not on file  Occupational History  . Not on file  Social Needs  . Financial resource strain: Not on file  . Food insecurity:    Worry: Not on file    Inability: Not on file  . Transportation needs:    Medical: Not on file    Non-medical: Not on file  Tobacco Use  . Smoking status: Former Smoker    Last attempt to quit: 09/12/1992    Years since quitting: 25.3  . Smokeless tobacco: Never Used  Substance and Sexual Activity  . Alcohol use: Not on file  . Drug use: Not on file  . Sexual activity: Not on file  Lifestyle  . Physical activity:    Days per week: Not on  file    Minutes per session: Not on file  . Stress: Not on file  Relationships  . Social connections:    Talks on phone: Not on file    Gets together: Not on file    Attends religious service: Not on file    Active member of club or organization: Not on file    Attends meetings of clubs or organizations: Not on file    Relationship status: Not on file  Other Topics Concern  . Not on file  Social History Narrative  . Not on file   Allergies  Allergen Reactions  . Sulfa Antibiotics Hives   No family history on file.  No family history of autoimmune  Current Outpatient Medications (Endocrine & Metabolic):  .  predniSONE (STERAPRED UNI-PAK 21 TAB) 10 MG (21) TBPK tablet, Take as directed   Current Outpatient Medications (Respiratory):  .  mometasone (NASONEX) 50 MCG/ACT nasal spray, Place 2 sprays into the nose daily as needed (for allergies).  Current Outpatient Medications (Analgesics):  .  diclofenac (VOLTAREN) 75 MG EC tablet, Take 1 tablet (75 mg total) by mouth 2 (two) times daily. Marland Kitchen  HYDROcodone-acetaminophen (NORCO) 5-325 MG per tablet, Take 1-2 tablets by mouth every 6 (six) hours as needed for pain.   Current Outpatient Medications (Other):  .  ciprofloxacin (CIPRO) 500 MG tablet, Take  1 tablet (500 mg total) by mouth 2 (two) times daily. Start day prior to office visit for foley removal .  diclofenac sodium (VOLTAREN) 1 % GEL, Apply 2 g topically 4 (four) times daily. .  fluocinonide cream (LIDEX) 8.03 %, Apply 1 application topically 2 (two) times daily as needed. For skin rash .  latanoprost (XALATAN) 0.005 % ophthalmic solution, Place 1 drop into both eyes at bedtime. Marland Kitchen  oxybutynin (DITROPAN) 5 MG tablet, Take 1 tablet (5 mg total) by mouth every 8 (eight) hours as needed. For bladder spasms while catheter in place .  ranitidine (ZANTAC) 150 MG tablet, Take 150 mg by mouth 2 (two) times daily. .  sennosides-docusate sodium (SENOKOT-S) 8.6-50 MG tablet, Take 1 tablet  by mouth 2 (two) times daily. While taking pain meds to prevent constipation    Past medical history, social, surgical and family history all reviewed in electronic medical record.  No pertanent information unless stated regarding to the chief complaint.   Review of Systems:  No headache, visual changes, nausea, vomiting, diarrhea, constipation, dizziness, abdominal pain, skin rash, fevers, chills, night sweats, weight loss, swollen lymph nodes, body aches, joint swelling, muscle aches, chest pain, shortness of breath, mood changes.   Objective  Blood pressure 138/90, pulse 62, height 6\' 2"  (1.88 m), weight 202 lb (91.6 kg), SpO2 98 %.    General: No apparent distress alert and oriented x3 mood and affect normal, dressed appropriately.  HEENT: Pupils equal, extraocular movements intact  Respiratory: Patient's speak in full sentences and does not appear short of breath  Cardiovascular: No lower extremity edema, non tender, no erythema  Skin: Warm dry intact with no signs of infection or rash on extremities or on axial skeleton.  Abdomen: Soft nontender  Neuro: Cranial nerves II through XII are intact, neurovascularly intact in all extremities with 2+ DTRs and 2+ pulses.  Lymph: No lymphadenopathy of posterior or anterior cervical chain or axillae bilaterally.  Gait antalgic MSK:  Non tender with full range of motion and good stability and symmetric strength and tone of shoulders, elbows, wrist, hip, knee and ankles bilaterally.  Foot exam shows the patient does have overpronation severely bilaterally.  Breakdown of the transverse arch.    Impression and Recommendations:      The above documentation has been reviewed and is accurate and complete Lyndal Pulley, DO       Note: This dictation was prepared with Dragon dictation along with smaller phrase technology. Any transcriptional errors that result from this process are unintentional.

## 2018-01-29 ENCOUNTER — Encounter: Payer: Self-pay | Admitting: Family Medicine

## 2018-01-29 ENCOUNTER — Ambulatory Visit: Payer: BC Managed Care – PPO | Admitting: Family Medicine

## 2018-01-29 DIAGNOSIS — M76821 Posterior tibial tendinitis, right leg: Secondary | ICD-10-CM

## 2018-01-29 DIAGNOSIS — G8929 Other chronic pain: Secondary | ICD-10-CM | POA: Diagnosis not present

## 2018-01-29 DIAGNOSIS — M5442 Lumbago with sciatica, left side: Secondary | ICD-10-CM

## 2018-01-29 NOTE — Assessment & Plan Note (Signed)
Stable as well.  No significant changes.  Follow-up again in 3 months or if worsening symptoms.  Patient has not been doing any medications on a regular basis.

## 2018-01-29 NOTE — Assessment & Plan Note (Signed)
Stable.  Good greater than 1 year almost some dental injection.  No significant bleeding changes.  Continue conservative therapy

## 2018-01-29 NOTE — Patient Instructions (Signed)
Good to see you  Ice is your friend Stay active  I am impressed  pennsaid pinkie amount topically 2 times daily as needed.   Arnica lotion 2 times a day for the bruising  Cut back on the turmeric to 3 times  A week

## 2018-05-30 NOTE — Progress Notes (Signed)
[name] [address] [place], [place] [address] Phone: [phone] Subjective:   Melvin [name], am serving as a scribe for Dr. Hulan Saas.   CC: Foot and back pain follow-up  [id number]  Melvin [name] is a 73 y.o. male coming in with complaint of back pain. Has area on left lower back that can act up periodically.   Feet are doing better but wants opinion on right foot protrusion. Patient has been doing a lot of walking at the fitness center and across campus.     Past Medical History:  Diagnosis Date  . Cancer [facility])    Past Surgical History:  Procedure Laterality Date  . LYMPHADENECTOMY Bilateral 09/12/2012   Procedure: LYMPHADENECTOMY;  Surgeon: Alexis Frock, MD;  Location: [facility];  Service: Urology;  Laterality: Bilateral;  . ROBOT ASSISTED LAPAROSCOPIC RADICAL PROSTATECTOMY N/A 09/12/2012   Procedure: ROBOTIC ASSISTED LAPAROSCOPIC RADICAL PROSTATECTOMY;  Surgeon: Alexis Frock, MD;  Location: [facility];  Service: Urology;  Laterality: N/A;   Social History   Socioeconomic History  . Marital status: Divorced    Spouse name: Not on file  . Number of children: Not on file  . Years of education: Not on file  . Highest education level: Not on file  Occupational History  . Not on file  Social Needs  . Financial resource strain: Not on file  . Food insecurity:    Worry: Not on file    Inability: Not on file  . Transportation needs:    Medical: Not on file    Non-medical: Not on file  Tobacco Use  . Smoking status: Former Smoker    Last attempt to quit: 09/12/1992    Years since quitting: 25.7  . Smokeless tobacco: Never Used  Substance and Sexual Activity  . Alcohol use: Not on file  . Drug use: Not on file  . Sexual activity: Not on file  Lifestyle  . Physical activity:    Days per week: Not on file    Minutes per session: Not on file  . Stress: Not on file  Relationships  . Social connections:    Talks on phone: Not on  file    Gets together: Not on file    Attends religious service: Not on file    Active member of club or organization: Not on file    Attends meetings of clubs or organizations: Not on file    Relationship status: Not on file  Other Topics Concern  . Not on file  Social History Narrative  . Not on file   Allergies  Allergen Reactions  . Sulfa Antibiotics Hives   [name] family history on file.  Current Outpatient Medications (Endocrine & Metabolic):  .  predniSONE (STERAPRED UNI-PAK 21 TAB) 10 MG (21) TBPK tablet, Take as directed   Current Outpatient Medications (Respiratory):  .  mometasone (NASONEX) 50 MCG/ACT nasal spray, Place 2 sprays into the nose daily as needed (for allergies).  Current Outpatient Medications (Analgesics):  .  diclofenac (VOLTAREN) 75 MG EC tablet, Take 1 tablet (75 mg total) by mouth 2 (two) times daily. Marland Kitchen  HYDROcodone-acetaminophen (NORCO) 5-325 MG per tablet, Take 1-2 tablets by mouth every 6 (six) hours as needed for pain.   Current Outpatient Medications (Other):  .  ciprofloxacin (CIPRO) 500 MG tablet, Take 1 tablet (500 mg total) by mouth 2 (two) times daily. Start day prior to office visit for foley removal .  diclofenac sodium (VOLTAREN) 1 %  GEL, Apply 2 g topically 4 (four) times daily. .  fluocinonide cream (LIDEX) 2.70 %, Apply 1 application topically 2 (two) times daily as needed. For skin rash .  latanoprost (XALATAN) 0.005 % ophthalmic solution, Place 1 drop into both eyes at bedtime. Marland Kitchen  oxybutynin (DITROPAN) 5 MG tablet, Take 1 tablet (5 mg total) by mouth every 8 (eight) hours as needed. For bladder spasms while catheter in place .  ranitidine (ZANTAC) 150 MG tablet, Take 150 mg by mouth 2 (two) times daily. .  sennosides-docusate sodium (SENOKOT-S) 8.6-50 MG tablet, Take 1 tablet by mouth 2 (two) times daily. While taking pain meds to prevent constipation    Past medical history, social, surgical and family history all reviewed in  electronic medical record.  [name] pertanent information unless stated regarding to the chief complaint.   Review of Systems:  [name] headache, visual changes, nausea, vomiting, diarrhea, constipation, dizziness, abdominal pain, skin rash, fevers, chills, night sweats, weight loss, swollen lymph nodes, body aches, joint swelling,  chest pain, shortness of breath, mood changes.  Positive muscle aches  Objective  Blood pressure 100/62, pulse 83, height 6\' 2"  (1.88 m), weight 202 lb (91.6 kg), SpO2 98 %.   General: [name] apparent distress alert and oriented x3 mood and affect normal, dressed appropriately.  HEENT: Pupils equal, extraocular movements intact  Respiratory: Patient's speak in full sentences and does not appear short of breath  Cardiovascular: [name] lower extremity edema, non tender, [name] erythema  Skin: Warm dry intact with [name] signs of infection or rash on extremities or on axial skeleton.  Abdomen: Soft nontender  Neuro: Cranial nerves II through XII are intact, neurovascularly intact in all extremities with 2+ DTRs and 2+ pulses.  Lymph: [name] lymphadenopathy of posterior or anterior cervical chain or axillae bilaterally.  Gait antalgic MSK:  Non tender with mid range of motion  stability and symmetric strength and tone of shoulders, elbows, wrist, hip, knee bilaterally.   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  Some loss of lordosis.  Patient does have forward flexion of 35 degrees and extension of 15 degrees.  Tender to palpation of the paraspinal musculature on the left side.  Mild positive Corky Sox on the left.  Negative straight leg.  4+ out of 5 strength in lower extremities bilaterally  Right ankle exam shows the patient does have subpatellar subluxation noted.  Patient does have mild hypertrophy of the medial malleolus but it does not seem to be significantly abnormal.  Nontender on exam.  Negative Tinel sign.  Arthritic changes.    Impression and  Recommendations:     This case required medical decision making of moderate complexity. The above documentation has been reviewed and is accurate and complete Lyndal Pulley, DO       Note: This dictation was prepared with Dragon dictation along with smaller phrase technology. Any transcriptional errors that result from this process are unintentional.

## 2018-05-31 ENCOUNTER — Encounter: Payer: Self-pay | Admitting: Family Medicine

## 2018-05-31 ENCOUNTER — Ambulatory Visit: Payer: BC Managed Care – PPO | Admitting: Family Medicine

## 2018-05-31 ENCOUNTER — Ambulatory Visit (INDEPENDENT_AMBULATORY_CARE_PROVIDER_SITE_OTHER)
Admission: RE | Admit: 2018-05-31 | Discharge: 2018-05-31 | Disposition: A | Discharge status: Home / Self Care | Payer: BC Managed Care – PPO | Source: Ambulatory Visit | Attending: Family Medicine | Admitting: Family Medicine

## 2018-05-31 VITALS — BP 100/62 | HR 83 | Ht 74.0 in | Wt 202.0 lb

## 2018-05-31 DIAGNOSIS — M19079 Primary osteoarthritis, unspecified ankle and foot: Secondary | ICD-10-CM | POA: Diagnosis not present

## 2018-05-31 DIAGNOSIS — G8929 Other chronic pain: Secondary | ICD-10-CM | POA: Diagnosis not present

## 2018-05-31 DIAGNOSIS — M5442 Lumbago with sciatica, left side: Secondary | ICD-10-CM

## 2018-05-31 DIAGNOSIS — M25571 Pain in right ankle and joints of right foot: Secondary | ICD-10-CM | POA: Diagnosis not present

## 2018-05-31 NOTE — Assessment & Plan Note (Signed)
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  Discussed with patient at great length.  I believe the patient is having more of a sub-tolerated instability noted.  We discussed custom bracing which patient declined at the moment.  Pain is not hurting him at all but he is a little concerned more with the what appears to be hypertrophy of the medial malleolus.  X-rays ordered today to further evaluate did not do ultrasound secondary to I do not think will change medical management.  Encourage patient to continue to wear good shoes, orthotics, icing regimen and continue the home exercises.  Follow-up again in 3 months

## 2018-05-31 NOTE — Patient Instructions (Addendum)
Good to see you  Ice is yoru friend We may need braces at some point.  Stay active Xray downs stairs Do the exercises for the back after a lot of activity See me again in  12 weeks

## 2018-05-31 NOTE — Assessment & Plan Note (Signed)
Multifactorial but likely underlying degenerative disc disease.  Patient only has intermittent pain and I do not think that any other significant changes are beneficial at this moment.  No change in medications.  Could do potentially gabapentin if needed but patient has been doing really well with even the back pain never stopping him from activities.  Discussed icing regimen, home exercises, which activities to doing which also avoid.  Follow-up with me again in 12weeks

## 2018-07-09 ENCOUNTER — Other Ambulatory Visit: Payer: Self-pay | Admitting: Family Medicine

## 2018-08-30 ENCOUNTER — Ambulatory Visit: Payer: BC Managed Care – PPO | Admitting: Family Medicine

## 2018-10-30 ENCOUNTER — Ambulatory Visit: Payer: BC Managed Care – PPO | Admitting: Family Medicine

## 2018-10-30 ENCOUNTER — Encounter: Payer: Self-pay | Admitting: Family Medicine

## 2018-10-30 ENCOUNTER — Other Ambulatory Visit: Payer: Self-pay

## 2018-10-30 VITALS — BP 140/76 | HR 72 | Ht 74.0 in | Wt 196.0 lb

## 2018-10-30 DIAGNOSIS — M76821 Posterior tibial tendinitis, right leg: Secondary | ICD-10-CM | POA: Diagnosis not present

## 2018-10-30 DIAGNOSIS — M5442 Lumbago with sciatica, left side: Secondary | ICD-10-CM | POA: Diagnosis not present

## 2018-10-30 DIAGNOSIS — M19079 Primary osteoarthritis, unspecified ankle and foot: Secondary | ICD-10-CM

## 2018-10-30 DIAGNOSIS — G8929 Other chronic pain: Secondary | ICD-10-CM

## 2018-10-30 NOTE — Progress Notes (Signed)
[name] [address] [place], [place] [address] Phone: [phone] Subjective:     CC: Right ankle pain, back pain follow-up  RSW:[id number]  Melvin [name] is a 73 y.o. male coming in with complaint of right ankle pain. States the ankle is doing well. Back pain today. Has questions about a back brace.   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  Patient has been wearing the orthotics, wearing the right shoes and has done well.  Been running and walking on a fairly regular basis.  Patient is also having more low back pain.  Describes it as a dull, throbbing aching pain.  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  Mild degenerative scoliosis.  Patient denies any radiation down the legs or any numbness or tingling.  Patient states he does well with daily activity.  Patient states that it is just continuing to give him some discomfort and pain mostly on a daily basis     Past Medical History:  Diagnosis Date  . Cancer [facility])    Past Surgical History:  Procedure Laterality Date  . LYMPHADENECTOMY Bilateral 09/12/2012   Procedure: LYMPHADENECTOMY;  Surgeon: Alexis Frock, MD;  Location: [facility];  Service: Urology;  Laterality: Bilateral;  . ROBOT ASSISTED LAPAROSCOPIC RADICAL PROSTATECTOMY N/A 09/12/2012   Procedure: ROBOTIC ASSISTED LAPAROSCOPIC RADICAL PROSTATECTOMY;  Surgeon: Alexis Frock, MD;  Location: [facility];  Service: Urology;  Laterality: N/A;   Social History   Socioeconomic History  . Marital status: Divorced    Spouse name: Not on file  . Number of children: Not on file  . Years of education: Not on file  . Highest education level: Not on file  Occupational History  . Not on file  Social Needs  . Financial resource strain: Not on file  . Food insecurity    Worry: Not on file    Inability: Not on file  . Transportation needs    Medical: Not on file    Non-medical: Not on file  Tobacco Use  . Smoking status: Former Smoker    Quit date: 09/12/1992    Years since quitting: 26.1  . Smokeless tobacco: Never Used  Substance and Sexual Activity  . Alcohol use: Not on file  . Drug use: Not on file  . Sexual activity: Not on file  Lifestyle  . Physical activity    Days per week: Not on file    Minutes per session: Not on file  . Stress: Not on file  Relationships  . Social Herbalist on phone: Not on file    Gets together: Not on file    Attends religious service: Not on file    Active member of club or organization: Not on file    Attends meetings of clubs or organizations: Not on file    Relationship status: Not on file  Other Topics Concern  . Not on file  Social History Narrative  . Not on file   Allergies  Allergen Reactions  . Sulfa Antibiotics Hives   No family history on file.  Current Outpatient Medications (Endocrine & Metabolic):  .  predniSONE (STERAPRED UNI-PAK 21 TAB) 10 MG (21) TBPK tablet, Take as directed   Current Outpatient Medications (Respiratory):  .  mometasone (NASONEX) 50 MCG/ACT nasal spray, Place 2 sprays into the nose daily as needed (for allergies).  Current Outpatient  Medications (Analgesics):  .  diclofenac (VOLTAREN) 75 MG EC tablet, TAKE 1 TABLET BY MOUTH TWICE DAILY. Marland Kitchen  HYDROcodone-acetaminophen (NORCO) 5-325 MG per tablet, Take 1-2 tablets by mouth every 6 (six) hours as needed for pain.   Current Outpatient Medications (Other):  .  ciprofloxacin (CIPRO) 500 MG tablet, Take 1 tablet (500 mg total) by mouth 2 (two) times daily. Start day prior to office visit for foley removal .  diclofenac sodium (VOLTAREN) 1 % GEL, Apply 2 g topically 4 (four) times daily. .  fluocinonide cream (LIDEX) 2.33 %, Apply 1 application topically 2 (two) times daily as needed. For skin rash .  latanoprost (XALATAN) 0.005 % ophthalmic solution, Place 1 drop into both eyes at  bedtime. Marland Kitchen  oxybutynin (DITROPAN) 5 MG tablet, Take 1 tablet (5 mg total) by mouth every 8 (eight) hours as needed. For bladder spasms while catheter in place .  ranitidine (ZANTAC) 150 MG tablet, Take 150 mg by mouth 2 (two) times daily. .  sennosides-docusate sodium (SENOKOT-S) 8.6-50 MG tablet, Take 1 tablet by mouth 2 (two) times daily. While taking pain meds to prevent constipation    Past medical history, social, surgical and family history all reviewed in electronic medical record.  No pertanent information unless stated regarding to the chief complaint.   Review of Systems:  No headache, visual changes, nausea, vomiting, diarrhea, constipation, dizziness, abdominal pain, skin rash, fevers, chills, night sweats, weight loss, swollen lymph nodes, body aches, joint swelling,  chest pain, shortness of breath, mood changes.  Positive muscle aches  Objective  Blood pressure 140/76, pulse 72, height 6\' 2"  (1.88 m), weight 196 lb (88.9 kg), SpO2 98 %.     General: No apparent distress alert and oriented x3 mood and affect normal, dressed appropriately.  HEENT: Pupils equal, extraocular movements intact  Respiratory: Patient's speak in full sentences and does not appear short of breath  Cardiovascular: No lower extremity edema, non tender, no erythema  Skin: Patient does have a little bit of a seborrheic dermatitis on his chest wall right side Abdomen: Soft nontender  Neuro: Cranial nerves II through XII are intact, neurovascularly intact in all extremities with 2+ DTRs and 2+ pulses.  Lymph: No lymphadenopathy of posterior or anterior cervical chain or axillae bilaterally.  Gait antalgic  MSK:  tender with limited range of motion and good stability and symmetric strength and tone of shoulders, elbows, wrist, hip, knee bilaterally.  Ankle:right  Severe over pronation of hindfoot mild arthritic changes of the ankle as well Limited ROM Strength is 5/5 in all directions. Stable lateral and  medial ligaments; squeeze test and kleiger test unremarkable; Talar dome mild ttp; No pain at base of 5th MT; No tenderness over cuboid; Moderate Ttp over posterior tib No sign of peroneal tendon subluxations or tenderness to palpation Negative tarsal tunnel tinel's Able to walk 4 steps.  Back Exam:  Inspection: loss of lordosis Motion: Flexion 40 deg, Extension 35 deg, Side Bending to 35 deg bilaterally,  Rotation to 45 deg bilaterally  SLR laying: Negative  XSLR laying: Negative  Palpable tenderness: Tender to palpation more around the L5-S1 area on the left side. FABER: Tightness on the left. Sensory change: Gross sensation intact to all lumbar and sacral dermatomes.  Reflexes: 2+ at both patellar tendons, 2+ at achilles tendons, Babinski's downgoing.  Strength at foot  Plantar-flexion: 5/5 Dorsi-flexion: 5/5 Eversion: 5/5 Inversion: 5/5  Leg strength  Quad: 5/5 Hamstring: 5/5 Hip flexor: 5/5 Hip abductors: 5/5  Impression and Recommendations:     This case required medical decision making of moderate complexity. The above documentation has been reviewed and is accurate and complete Lyndal Pulley, DO       Note: This dictation was prepared with Dragon dictation along with smaller phrase technology. Any transcriptional errors that result from this process are unintentional.

## 2018-10-30 NOTE — Assessment & Plan Note (Signed)
Stable.  No change in management at this time.  If worsening will consider another injection.  Continue topical anti-inflammatories, home exercises and orthotics

## 2018-10-30 NOTE — Assessment & Plan Note (Signed)
Seems to be more muscular in nature but does have some facet arthropathy that could be contributing.  Patient declined formal physical therapy.  Given home exercises again.  We discussed the possibility of other medications which patient also declined.  We discussed core strengthening, hip abductor strengthening and weight loss that could all be beneficial.  Follow-up with me again in 3 months will call if any exacerbation

## 2018-10-30 NOTE — Patient Instructions (Addendum)
Good to see you pennsaid pinkie amount topically 2 times daily as needed. Cream on rash and Band-Aid before running.  Deep massage gun or theragun Stretch back after running Call us when you need [place]

## 2018-11-22 ENCOUNTER — Ambulatory Visit: Payer: BC Managed Care – PPO | Admitting: Family Medicine

## 2019-07-29 ENCOUNTER — Ambulatory Visit: Payer: BC Managed Care – PPO | Admitting: Family Medicine

## 2019-07-29 ENCOUNTER — Other Ambulatory Visit: Payer: Self-pay | Admitting: Family Medicine

## 2019-08-01 ENCOUNTER — Ambulatory Visit (INDEPENDENT_AMBULATORY_CARE_PROVIDER_SITE_OTHER): Payer: BC Managed Care – PPO

## 2019-08-01 ENCOUNTER — Other Ambulatory Visit: Payer: Self-pay

## 2019-08-01 ENCOUNTER — Ambulatory Visit: Payer: BC Managed Care – PPO | Admitting: Family Medicine

## 2019-08-01 ENCOUNTER — Encounter: Payer: Self-pay | Admitting: Family Medicine

## 2019-08-01 VITALS — BP 154/100 | HR 67 | Ht 74.0 in | Wt 191.0 lb

## 2019-08-01 DIAGNOSIS — M48061 Spinal stenosis, lumbar region without neurogenic claudication: Secondary | ICD-10-CM

## 2019-08-01 DIAGNOSIS — M545 Low back pain, unspecified: Secondary | ICD-10-CM

## 2019-08-01 DIAGNOSIS — G8929 Other chronic pain: Secondary | ICD-10-CM

## 2019-08-01 MED ORDER — GABAPENTIN 100 MG PO CAPS: 200.0000 mg | ORAL_CAPSULE | Freq: Every day | ORAL | 3 refills | Status: DC

## 2019-08-01 MED ORDER — VITAMIN D (ERGOCALCIFEROL) 1.25 MG (50000 UNIT) PO CAPS: 50000.0000 [IU] | ORAL_CAPSULE | ORAL | 0 refills | Status: DC

## 2019-08-01 NOTE — Patient Instructions (Signed)
Good to see you Gabapentin 200 mg at night Once weekly vitamin D Xray today on the back Tart cherry 1200 mg at night  See me again in 6 weeks

## 2019-08-01 NOTE — Progress Notes (Signed)
[facility] [address] [place] Phone: [phone] Subjective:   This visit occurred during the SARS-CoV-2 public health emergency.  Safety protocols were in place, including screening questions prior to the visit, additional usage of staff PPE, and extensive cleaning of exam room while observing appropriate contact time as indicated for disinfecting solutions.   I'm seeing this patient by the request  of:  Orpah Melter, MD  CC: Low back pain and R ankle pain  [id number]   10/30/2018 Stable.  No change in management at this time.  If worsening will consider another injection.  Continue topical anti-inflammatories, home exercises and orthotics  08/01/2019 Melvin [name] is a 74 y.o. male coming in with complaint of right ankle and low back pain. He has orthotics for his shoes and has been provided w/ shoe wear recommendations in the past.  He has also been seen previously for dull, aching and throbbing LBP but denied any radiating pain into his LEs or radicular symptoms.  Since his last visit, pt reports the ankle is fine the back is painful. Lower back left sided pain. Pain effects his posture. Walking causes pain. Patient states he feels a muscle twitch sometimes.   Patient is accompanied with seems to be a little bit more unstable.  Significant other who states that unfortunately secondary to how he is walking and seems to be hurting himself more.  Patient does states he would rate the severity of pain is 5 out of 10     Past Medical History:  Diagnosis Date  . Cancer [facility])    Past Surgical History:  Procedure Laterality Date  . LYMPHADENECTOMY Bilateral 09/12/2012   Procedure: LYMPHADENECTOMY;  Surgeon: Alexis Frock, MD;  Location: [facility];  Service: Urology;  Laterality: Bilateral;  . ROBOT ASSISTED LAPAROSCOPIC RADICAL PROSTATECTOMY N/A 09/12/2012   Procedure: ROBOTIC ASSISTED LAPAROSCOPIC RADICAL PROSTATECTOMY;  Surgeon: Alexis Frock, MD;  Location: [facility];  Service: Urology;  Laterality: N/A;   Social History   Socioeconomic History  . Marital status: Divorced    Spouse name: Not on file  . Number of children: Not on file  . Years of education: Not on file  . Highest education level: Not on file  Occupational History  . Not on file  Tobacco Use  . Smoking status: Former Smoker    Quit date: 09/12/1992    Years since quitting: 26.9  . Smokeless tobacco: Never Used  Substance and Sexual Activity  . Alcohol use: Not on file  . Drug use: Not on file  . Sexual activity: Not on file  Other Topics Concern  . Not on file  Social History Narrative  . Not on file   Social Determinants of Health   Financial Resource Strain:   . Difficulty of Paying Living Expenses:   Food Insecurity:   . Worried About Charity fundraiser in the Last Year:   . Arboriculturist in the Last Year:   Transportation Needs:   . Film/video editor (Medical):   Marland Kitchen Lack of Transportation (Non-Medical):   Physical Activity:   . Days of Exercise per Week:   . Minutes of Exercise per Session:   Stress:   . Feeling of Stress :   Social Connections:   . Frequency of Communication with Friends and Family:   . Frequency of Social Gatherings with Friends and Family:   . Attends Religious Services:   . Active Member of Clubs or  Organizations:   . Attends Archivist Meetings:   Marland Kitchen Marital Status:    Allergies  Allergen Reactions  . Sulfa Antibiotics Hives   No family history on file.  Current Outpatient Medications (Endocrine & Metabolic):  .  predniSONE (STERAPRED UNI-PAK 21 TAB) 10 MG (21) TBPK tablet, Take as directed   Current Outpatient Medications (Respiratory):  .  mometasone (NASONEX) 50 MCG/ACT nasal spray, Place 2 sprays into the nose daily as needed (for allergies).  Current Outpatient Medications (Analgesics):  .  diclofenac (VOLTAREN) 75 MG EC tablet, TAKE 1 TABLET BY MOUTH TWICE DAILY. Marland Kitchen   HYDROcodone-acetaminophen (NORCO) 5-325 MG per tablet, Take 1-2 tablets by mouth every 6 (six) hours as needed for pain.   Current Outpatient Medications (Other):  .  ciprofloxacin (CIPRO) 500 MG tablet, Take 1 tablet (500 mg total) by mouth 2 (two) times daily. Start day prior to office visit for foley removal .  diclofenac sodium (VOLTAREN) 1 % GEL, Apply 2 g topically 4 (four) times daily. .  fluocinonide cream (LIDEX) [id number] %, Apply 1 application topically 2 (two) times daily as needed. For skin rash .  latanoprost (XALATAN) 0.005 % ophthalmic solution, Place 1 drop into both eyes at bedtime. Marland Kitchen  oxybutynin (DITROPAN) 5 MG tablet, Take 1 tablet (5 mg total) by mouth every 8 (eight) hours as needed. For bladder spasms while catheter in place .  ranitidine (ZANTAC) 150 MG tablet, Take 150 mg by mouth 2 (two) times daily. .  sennosides-docusate sodium (SENOKOT-S) 8.6-50 MG tablet, Take 1 tablet by mouth 2 (two) times daily. While taking pain meds to prevent constipation .  gabapentin (NEURONTIN) 100 MG capsule, Take 2 capsules (200 mg total) by mouth at bedtime. .  Vitamin D, Ergocalciferol, (DRISDOL) 1.25 MG (50000 UNIT) CAPS capsule, Take 1 capsule (50,000 Units total) by mouth every 7 (seven) days.   Reviewed prior external information including notes and imaging from  primary care provider As well as notes that were available from care everywhere and other healthcare systems.  Past medical history, social, surgical and family history all reviewed in electronic medical record.  No pertanent information unless stated regarding to the chief complaint.   Review of Systems:  No headache, visual changes, nausea, vomiting, diarrhea, constipation, dizziness, abdominal pain, skin rash, fevers, chills, night sweats, weight loss, swollen lymph nodes,  joint swelling, chest pain, shortness of breath, mood changes. POSITIVE muscle aches, body aches  Objective  Blood pressure (!) 154/100, pulse 67,  height 6\' 2"  (1.88 m), weight 191 lb (86.6 kg), SpO2 98 %.   General: No apparent distress alert and oriented x3 mood and affect normal, dressed appropriately.  HEENT: Pupils equal, extraocular movements intact  Respiratory: Patient's speak in full sentences and does not appear short of breath  Cardiovascular: No lower extremity edema, non tender, no erythema  Neuro: Cranial nerves II through XII are intact, neurovascularly intact in all extremities with 2+ DTRs and 2+ pulses.  Gait continues to be antalgic with patient having external rotation of the right hip.  Significant scoliosis noted of the back and increasing kyphosis noted.  Patient does want to scoliotic curve 1 in the thoracic and 1 in the lumbar. Tightness noted with straight leg test on the right side.  Mild tightness with Corky Sox test bilaterally as well.  Tender to palpation mostly in the L4-L5 area on the right side.  Seems to be worse with extension.  [id number]; 15 additional minutes spent for Therapeutic exercises as  stated in above notes.  This included exercises focusing on stretching, strengthening, with significant focus on eccentric aspects.   Long term goals include an improvement in range of motion, strength, endurance as well as avoiding reinjury. Patient's frequency would include in 1-2 times a day, 3-5 times a week for a duration of 6-12 weeks. Low back exercises that included:  Pelvic tilt/bracing instruction to focus on control of the pelvic girdle and lower abdominal muscles  Glute strengthening exercises, focusing on proper firing of the glutes without engaging the low back muscles Proper stretching techniques for maximum relief for the hamstrings, hip flexors, low back and some rotation where tolerated    Proper technique shown and discussed handout in great detail with ATC.  All questions were discussed and answered.     Impression and Recommendations:     This case required medical decision making of moderate  complexity. The above documentation has been reviewed and is accurate and complete Lyndal Pulley, DO       Note: This dictation was prepared with Dragon dictation along with smaller phrase technology. Any transcriptional errors that result from this process are unintentional.

## 2019-08-03 ENCOUNTER — Encounter: Payer: Self-pay | Admitting: Family Medicine

## 2019-08-03 DIAGNOSIS — M48061 Spinal stenosis, lumbar region without neurogenic claudication: Secondary | ICD-10-CM | POA: Insufficient documentation

## 2019-08-03 NOTE — Assessment & Plan Note (Signed)
New undiagnosed problem.  X-rays pending.  Patient is having scoliosis as an underlying problem and I do believe that there is degenerative spinal stenosis with patient having worsening pain with extension as well as pain with increasing activity.  Discussed gabapentin which was prescribed today, work with Product/process development scientist to learn home exercises, patient has pain medication from another provider for breakthrough pain.  Discussed topical anti-inflammatories, discussed the potential need for formal physical therapy in the future.  Follow-up with me again in 4 to 8 weeks

## 2019-09-17 ENCOUNTER — Other Ambulatory Visit: Payer: Self-pay

## 2019-09-17 ENCOUNTER — Encounter: Payer: Self-pay | Admitting: Family Medicine

## 2019-09-17 ENCOUNTER — Ambulatory Visit (INDEPENDENT_AMBULATORY_CARE_PROVIDER_SITE_OTHER): Payer: BC Managed Care – PPO | Admitting: Family Medicine

## 2019-09-17 VITALS — BP 118/64 | HR 68 | Ht 74.0 in | Wt 188.0 lb

## 2019-09-17 DIAGNOSIS — M48061 Spinal stenosis, lumbar region without neurogenic claudication: Secondary | ICD-10-CM

## 2019-09-17 MED ORDER — DULOXETINE HCL 20 MG PO CPEP: 20.0000 mg | ORAL_CAPSULE | Freq: Every day | ORAL | 0 refills | Status: DC

## 2019-09-17 MED ORDER — PREDNISONE 20 MG PO TABS: 20.0000 mg | ORAL_TABLET | Freq: Every day | ORAL | 0 refills | Status: DC

## 2019-09-17 NOTE — Patient Instructions (Addendum)
PT [facility] PEnnsaid 2x a day Prednisone 20mg  if in severe pain Cymbalta 20mg  start tomorrow See me in 6-7 weeks

## 2019-09-17 NOTE — Assessment & Plan Note (Signed)
Patient does have spinal stenosis.  Minimal change from previous exam at this time.  I do believe that formal physical therapy would be beneficial and patient will be referred today.  Discontinue of the gabapentin secondary to side effects but started on Cymbalta.  This is a chronic problem with mild exacerbation.  Worsening symptoms advanced imaging and potentially epidurals will be needed.  Hopefully patient will respond to conservative therapy.  Follow-up again in 6 weeks

## 2019-09-17 NOTE — Progress Notes (Signed)
[place] Phone: [phone] Subjective:   [name], am serving as a scribe for Dr. Hulan Saas. This visit occurred during the SARS-CoV-2 public health emergency.  Safety protocols were in place, including screening questions prior to the visit, additional usage of staff PPE, and extensive cleaning of exam room while observing appropriate contact time as indicated for disinfecting solutions.   I'm seeing this patient by the request  of:  Orpah Melter, MD  CC: Low back pain follow-up  [id number]   08/01/2019 New undiagnosed problem.  X-rays pending.  Patient is having scoliosis as an underlying problem and I do believe that there is degenerative spinal stenosis with patient having worsening pain with extension as well as pain with increasing activity.  Discussed gabapentin which was prescribed today, work with Product/process development scientist to learn home exercises, patient has pain medication from another provider for breakthrough pain.  Discussed topical anti-inflammatories, discussed the potential need for formal physical therapy in the future.  Follow-up with me again in 4 to 8 weeks  Update 09/17/2019 Jakson Duchateau is a 74 y.o. male coming in with complaint of lumbar spinal stenosis. States intensity of pain varies each day but he does have pain daily. Pain is better in the morning. Has been more away of curvature in his spine and is trying to stand up straighter. Is no longer using gabapentin due to feeling of grogginess in the mornings. Also complains of constipation with using gabapentin and vitamin D.  Patient states that he continues to have some discomfort.  Patient is accompanied with significant other who states patient has had difficulty with ambulation with room even lows.  About every 400 to 500 feet patient does need to take a rest.     Past Medical History:  Diagnosis Date  . Cancer [facility])    Past Surgical  History:  Procedure Laterality Date  . LYMPHADENECTOMY Bilateral 09/12/2012   Procedure: LYMPHADENECTOMY;  Surgeon: Alexis Frock, MD;  Location: [facility];  Service: Urology;  Laterality: Bilateral;  . ROBOT ASSISTED LAPAROSCOPIC RADICAL PROSTATECTOMY N/A 09/12/2012   Procedure: ROBOTIC ASSISTED LAPAROSCOPIC RADICAL PROSTATECTOMY;  Surgeon: Alexis Frock, MD;  Location: [facility];  Service: Urology;  Laterality: N/A;   Social History   Socioeconomic History  . Marital status: Divorced    Spouse name: Not on file  . Number of children: Not on file  . Years of education: Not on file  . Highest education level: Not on file  Occupational History  . Not on file  Tobacco Use  . Smoking status: Former Smoker    Quit date: 09/12/1992    Years since quitting: 27.0  . Smokeless tobacco: Never Used  Substance and Sexual Activity  . Alcohol use: Not on file  . Drug use: Not on file  . Sexual activity: Not on file  Other Topics Concern  . Not on file  Social History Narrative  . Not on file   Social Determinants of Health   Financial Resource Strain:   . Difficulty of Paying Living Expenses:   Food Insecurity:   . Worried About Charity fundraiser in the Last Year:   . Arboriculturist in the Last Year:   Transportation Needs:   . Film/video editor (Medical):   Marland Kitchen Lack of Transportation (Non-Medical):   Physical Activity:   . Days of Exercise per Week:   . Minutes of Exercise per Session:  Stress:   . Feeling of Stress :   Social Connections:   . Frequency of Communication with Friends and Family:   . Frequency of Social Gatherings with Friends and Family:   . Attends Religious Services:   . Active Member of Clubs or Organizations:   . Attends Archivist Meetings:   Marland Kitchen Marital Status:    Allergies  Allergen Reactions  . Sulfa Antibiotics Hives   No family history on file.  Current Outpatient Medications (Endocrine & Metabolic):  .  predniSONE (STERAPRED UNI-PAK  21 TAB) 10 MG (21) TBPK tablet, Take as directed .  predniSONE (DELTASONE) 20 MG tablet, Take 1 tablet (20 mg total) by mouth daily with breakfast.   Current Outpatient Medications (Respiratory):  .  mometasone (NASONEX) 50 MCG/ACT nasal spray, Place 2 sprays into the nose daily as needed (for allergies).  Current Outpatient Medications (Analgesics):  .  diclofenac (VOLTAREN) 75 MG EC tablet, TAKE 1 TABLET BY MOUTH TWICE DAILY. Marland Kitchen  HYDROcodone-acetaminophen (NORCO) 5-325 MG per tablet, Take 1-2 tablets by mouth every 6 (six) hours as needed for pain.   Current Outpatient Medications (Other):  .  ciprofloxacin (CIPRO) 500 MG tablet, Take 1 tablet (500 mg total) by mouth 2 (two) times daily. Start day prior to office visit for foley removal .  diclofenac sodium (VOLTAREN) 1 % GEL, Apply 2 g topically 4 (four) times daily. .  fluocinonide cream (LIDEX) [id number] %, Apply 1 application topically 2 (two) times daily as needed. For skin rash .  gabapentin (NEURONTIN) 100 MG capsule, Take 2 capsules (200 mg total) by mouth at bedtime. Marland Kitchen  latanoprost (XALATAN) 0.005 % ophthalmic solution, Place 1 drop into both eyes at bedtime. Marland Kitchen  oxybutynin (DITROPAN) 5 MG tablet, Take 1 tablet (5 mg total) by mouth every 8 (eight) hours as needed. For bladder spasms while catheter in place .  ranitidine (ZANTAC) 150 MG tablet, Take 150 mg by mouth 2 (two) times daily. .  sennosides-docusate sodium (SENOKOT-S) 8.6-50 MG tablet, Take 1 tablet by mouth 2 (two) times daily. While taking pain meds to prevent constipation .  Vitamin D, Ergocalciferol, (DRISDOL) 1.25 MG (50000 UNIT) CAPS capsule, Take 1 capsule (50,000 Units total) by mouth every 7 (seven) days. .  DULoxetine (CYMBALTA) 20 MG capsule, Take 1 capsule (20 mg total) by mouth daily.   Reviewed prior external information including notes and imaging from  primary care provider As well as notes that were available from care everywhere and other healthcare  systems.  Past medical history, social, surgical and family history all reviewed in electronic medical record.  No pertanent information unless stated regarding to the chief complaint.   Review of Systems:  No headache, visual changes, nausea, vomiting, diarrhea, constipation, dizziness, abdominal pain, skin rash, fevers, chills, night sweats, weight loss, swollen lymph nodes,  joint swelling, chest pain, shortness of breath, mood changes. POSITIVE muscle aches, body aches  Objective  Blood pressure 118/64, pulse 68, height 6\' 2"  (1.88 m), weight 188 lb (85.3 kg).   General: No apparent distress alert and oriented x3 mood and affect normal, dressed appropriately.  HEENT: Pupils equal, extraocular movements intact  Respiratory: Patient's speak in full sentences and does not appear short of breath  Cardiovascular: trace lower extremity edema, non tender, no erythema  Neuro: Cranial nerves II through XII are intact, neurovascularly intact in all extremities with 2+ DTRs and 2+ pulses.  Gait antalgic MSK:  tender with limited range of motion  Significant arthritic  changes of multiple joints.  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  Limited extension of only 5 to 10 degrees.  Patient has limited sidebending.  Neurovascular intact distally.  Severe overpronation of the hindfoot and midfoot bilaterally    Impression and Recommendations:     This case required medical decision making of moderate complexity. The above documentation has been reviewed and is accurate and complete Lyndal Pulley, DO       Note: This dictation was prepared with Dragon dictation along with smaller phrase technology. Any transcriptional errors that result from this process are unintentional.

## 2019-09-18 ENCOUNTER — Other Ambulatory Visit: Payer: Self-pay

## 2019-09-18 ENCOUNTER — Ambulatory Visit: Payer: BC Managed Care – PPO | Attending: Family Medicine | Admitting: Physical Therapy

## 2019-09-18 ENCOUNTER — Encounter: Payer: Self-pay | Admitting: Physical Therapy

## 2019-09-18 DIAGNOSIS — R293 Abnormal posture: Secondary | ICD-10-CM | POA: Insufficient documentation

## 2019-09-18 DIAGNOSIS — M545 Low back pain: Secondary | ICD-10-CM | POA: Insufficient documentation

## 2019-09-18 DIAGNOSIS — G8929 Other chronic pain: Secondary | ICD-10-CM | POA: Insufficient documentation

## 2019-09-18 DIAGNOSIS — M62830 Muscle spasm of back: Secondary | ICD-10-CM | POA: Diagnosis present

## 2019-09-18 NOTE — Therapy (Signed)
[facility] [address] [place], [place], [address] Phone: [phone]   Fax:  [phone]  Physical Therapy Evaluation  Patient Details  Name: Melvin [name] MRN: [id number] Date of Birth: [birth year] Referring Provider (PT): Hulan Saas, DO   Encounter Date: 09/18/2019  PT End of Session - 09/18/19 1251    Visit Number  1    Date for PT Re-Evaluation  11/07/19    Authorization Type  BCBS    Authorization Time Period  09/18/19 to 11/07/19    Authorization - Visit Number  1    Authorization - Number of Visits  10    PT Start Time  [id number]    PT Stop Time  1228    PT Time Calculation (min)  42 min    Activity Tolerance  Patient tolerated treatment well;No increased pain    Behavior During Therapy  WFL for tasks assessed/performed       Past Medical History:  Diagnosis Date  . Cancer [facility])     Past Surgical History:  Procedure Laterality Date  . LYMPHADENECTOMY Bilateral 09/12/2012   Procedure: LYMPHADENECTOMY;  Surgeon: Alexis Frock, MD;  [facility];  Service: Urology;  Laterality: Bilateral;  . ROBOT ASSISTED LAPAROSCOPIC RADICAL PROSTATECTOMY N/A 09/12/2012   Procedure: ROBOTIC ASSISTED LAPAROSCOPIC RADICAL PROSTATECTOMY;  Surgeon: Alexis Frock, MD;  [facility];  Service: Urology;  Laterality: N/A;    There were no vitals filed for this visit.   Subjective Assessment - 09/18/19 1149    Subjective  Pt states that he has been having low back pain for years. His pain is on and off throughout the week. Mostly it is on the Lt side when is static standing for long periods of time. He has been trying to stand tall more throughout the day.    Limitations  Standing    How long can you stand comfortably?  less than 10 minutes    Diagnostic tests  Xray: scoliosis degenerative changes    Currently in Pain?  No/denies         [facility] PT Assessment - 09/18/19 0001      Assessment   Medical Diagnosis   Degenerative changes lumbar spine    Referring Provider (PT)  Hulan Saas, DO    Onset Date/Surgical Date  --   chronic issue   Next MD Visit  none yet      Precautions   Precautions  None      Restrictions   Weight Bearing Restrictions  No      Balance Screen   Has the patient fallen in the past 6 months  No    Has the patient had a decrease in activity level because of a fear of falling?   No    Is the patient reluctant to leave their home because of a fear of falling?   No      Home Film/video editor residence      Prior Function   Leisure  walking ~1 mile, 4-5 days a week       Cognition   Overall Cognitive Status  Within Functional Limits for tasks assessed      Observation/Other Assessments   Observations  pt standing with Rt lateral shift of the trunk    Focus on Therapeutic Outcomes (FOTO)   35% limited      ROM / Strength   AROM / PROM / Strength  AROM;Strength;PROM  AROM   AROM Assessment Site  Lumbar    Lumbar Flexion  WNL    Lumbar Extension  to neutral, end range discomfort     Lumbar - Right Rotation  50% limited     Lumbar - Left Rotation  50% limited, discomfort Lt side low back       Flexibility   Soft Tissue Assessment /Muscle Length  yes    Hamstrings  WFL      Palpation   Palpation comment  palpable trigger point and tenderness Lt QL, lumbar paraspinals                   Objective measurements completed on examination: See above findings.      [place] Adult PT Treatment/Exercise - 09/18/19 0001      Exercises   Exercises  Lumbar      Lumbar Exercises: Stretches   Other Lumbar Stretch Exercise  seated lateral trunk flexion stretch 2x10 sec each direction      Manual Therapy   Manual Therapy  Myofascial release    Myofascial Release  trigger point release Lt QL             PT Education - 09/18/19 1250    Education Details  eval findings/POC    Person(s) Educated  Patient    Methods   Explanation    Comprehension  Verbalized understanding       PT Short Term Goals - 09/18/19 1256      PT SHORT TERM GOAL #1   Title  Pt will be independent and consistent with his initial HEP to improve posture awareness and decrease pain.    Time  3    Period  Weeks    Status  New    Target Date  10/09/19        PT Long Term Goals - 09/18/19 1257      PT LONG TERM GOAL #1   Title  Pt will have improved lumbar spine mobility evident by his ability to extend beyond neutral in standing.    Time  6    Period  Weeks    Status  New    Target Date  11/07/19      PT LONG TERM GOAL #2   Title  Pt will be able to walk 1 mile atleast 4 days a week without increase in Lt side low back pain.    Time  6    Period  Weeks    Status  New      PT LONG TERM GOAL #3   Title  Pt will report atleast 60% improvement in his pain from the start of PT.    Time  6    Period  Weeks    Status  New      PT LONG TERM GOAL #4   Title  Pt will have improved trunk strength evident by his ability to maintain a more upright posture throughout an entire session.    Time  6    Period  Weeks    Status  New             Plan - 09/18/19 1249    Clinical Impression Statement  Pt is a 74 y.o M referred to OPPT with complaints of chronic Lt sided low back pain. In the recent months, his pain has increased, and he is limited in his daily activity and prolonged standing. Pt has scoliosis with Rt lateral shift noted in standing. He  has good LE strength, but he lacks adequate trunk mobility in rotation and extension primarily. There are palpable trigger points in the Lt QL. PT completed manual treatment to the area and provided additional QL stretch for him to complete at home. Pt would benefit from skilled PT to address his ROM restrictions and improve his strength/endurance to allow improved posture and activity participation throughout the day.    Personal Factors and Comorbidities  Age;Time since onset of  injury/illness/exacerbation;Fitness    Examination-Activity Limitations  Stand;Locomotion Level    Examination-Participation Restrictions  Tour manager    Stability/Clinical Decision Making  Stable/Uncomplicated    Clinical Decision Making  Low    Rehab Potential  Good    PT Frequency  2x / week    PT Duration  6 weeks    PT Treatment/Interventions  ADLs/Self Care Home Management;Aquatic Therapy;Cryotherapy;Electrical Stimulation;Moist Heat;Functional mobility training;Neuromuscular re-education;Balance training;Therapeutic exercise;Therapeutic activities;Patient/family education;Manual techniques;Dry needling;Taping    PT Next Visit Plan  manual and possible d/n to Lt QL if pt agreeable; begin trunk strengthening    PT Home Exercise Plan  [facility]; pt completing series of stretches from MD    Consulted and Agree with Plan of Care  Patient       Patient will benefit from skilled therapeutic intervention in order to improve the following deficits and impairments:  Improper body mechanics, Pain, Postural dysfunction, Increased muscle spasms, Decreased mobility, Decreased activity tolerance, Decreased range of motion, Decreased strength, Hypomobility, Impaired flexibility, Decreased balance  Visit Diagnosis: Chronic left-sided low back pain without sciatica  Abnormal posture  Muscle spasm of back     Problem List Patient Active Problem List   Diagnosis Date Noted  . Degenerative lumbar spinal stenosis 08/03/2019  . 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 Low back pain 10/30/2017  . Posterior tibial tendon dysfunction (PTTD) of right lower extremity 02/24/2017    1:01 PM,09/18/19 [name] PT, DPT [place] [facility] [address] [place], [place], [address] Phone: [phone]   Fax:  [phone]  Name: Melvin [name] MRN: [id number] Date of Birth: 13-Aug-1945

## 2019-09-18 NOTE — Patient Instructions (Signed)
   Access Code: [email or web address]: [email or web address]: 05/19/2021Prepared by: [place] - 1 x daily - 7 x weekly - 3 sets - 20 sec hold   [facility] [address] [place], [place] [address] Phone # [phone] Fax [phone]

## 2019-10-08 ENCOUNTER — Ambulatory Visit: Payer: BC Managed Care – PPO | Attending: Family Medicine | Admitting: Physical Therapy

## 2019-10-08 ENCOUNTER — Other Ambulatory Visit: Payer: Self-pay

## 2019-10-08 ENCOUNTER — Encounter: Payer: Self-pay | Admitting: Physical Therapy

## 2019-10-08 DIAGNOSIS — G8929 Other chronic pain: Secondary | ICD-10-CM

## 2019-10-08 DIAGNOSIS — R293 Abnormal posture: Secondary | ICD-10-CM | POA: Insufficient documentation

## 2019-10-08 DIAGNOSIS — M545 Low back pain, unspecified: Secondary | ICD-10-CM

## 2019-10-08 DIAGNOSIS — M62830 Muscle spasm of back: Secondary | ICD-10-CM | POA: Diagnosis present

## 2019-10-08 NOTE — Patient Instructions (Signed)
Access Code: [email or web address]: [email or web address]: 06/08/2021Prepared by: [facility]  Seated Sidebending - 1 x daily - 7 x weekly - 3 sets - 20 sec hold  Supine Single Knee to Chest Stretch - 1 x daily - 7 x weekly - 5 reps - 10 seconds hold  Supine Lower Trunk Rotation - 1 x daily - 7 x weekly - 10 reps  ,[facility] [address] [place], [place] [address] Phone # [phone] Fax [phone]

## 2019-10-08 NOTE — Therapy (Signed)
[facility] [address] [place], [place], [address] Phone: [phone]   Fax:  [phone]  Physical Therapy Treatment  Patient Details  Name: Melvin [name] MRN: [id number] Date of Birth: 1945-10-01 Referring Provider (PT): Hulan Saas, DO   Encounter Date: 10/08/2019  PT End of Session - 10/08/19 0844    Visit Number  2    Date for PT Re-Evaluation  11/07/19    Authorization Type  BCBS    Authorization Time Period  09/18/19 to 11/07/19    Authorization - Visit Number  2    Authorization - Number of Visits  10    PT Start Time  0800    PT Stop Time  5284    PT Time Calculation (min)  38 min    Activity Tolerance  Patient tolerated treatment well;No increased pain    Behavior During Therapy  WFL for tasks assessed/performed       Past Medical History:  Diagnosis Date  . Cancer [facility])     Past Surgical History:  Procedure Laterality Date  . LYMPHADENECTOMY Bilateral 09/12/2012   Procedure: LYMPHADENECTOMY;  Surgeon: Alexis Frock, MD;  [facility];  Service: Urology;  Laterality: Bilateral;  . ROBOT ASSISTED LAPAROSCOPIC RADICAL PROSTATECTOMY N/A 09/12/2012   Procedure: ROBOTIC ASSISTED LAPAROSCOPIC RADICAL PROSTATECTOMY;  Surgeon: Alexis Frock, MD;  [facility];  Service: Urology;  Laterality: N/A;    There were no vitals filed for this visit.  Subjective Assessment - 10/08/19 0804    Subjective  Pt states that things are not worse but he has not been able to do his stretches too much. He has been traveling some. No pain currently. He felt good for "a while" following his last session.    Limitations  Standing    How long can you stand comfortably?  less than 10 minutes    Diagnostic tests  Xray: scoliosis degenerative changes    Currently in Pain?  No/denies                        [facility] Adult PT Treatment/Exercise - 10/08/19 0001      Lumbar Exercises: Aerobic   Nustep  Seat  12: L3 x5 min PT present to discuss session and HEP      Lumbar Exercises: Supine   Other Supine Lumbar Exercises  hooklying low trunk rotation Rt x10 reps     Other Supine Lumbar Exercises  bent knee 90/90 alternating LE march x10 reps; pallof press red TB with bent knee 90/90 5x5 sec hold each direction      Lumbar Exercises: Prone   Straight Leg Raise  10 reps      Manual Therapy   Manual Therapy  Joint mobilization    Joint Mobilization  Grade III-IV CPAs L2 to L5 x2 bouts     Myofascial Release  trigger point release Lt QL/paraspinals; soft tissue mobilization Lt paraspinals             PT Education - 10/08/19 0839    Education Details  technique with therex    Person(s) Educated  Patient    Methods  Explanation;Verbal cues;Tactile cues    Comprehension  Verbalized understanding;Returned demonstration       PT Short Term Goals - 09/18/19 1256      PT SHORT TERM GOAL #1   Title  Pt will be independent and consistent with his initial HEP to improve posture awareness and decrease pain.  Time  3    Period  Weeks    Status  New    Target Date  10/09/19        PT Long Term Goals - 09/18/19 1257      PT LONG TERM GOAL #1   Title  Pt will have improved lumbar spine mobility evident by his ability to extend beyond neutral in standing.    Time  6    Period  Weeks    Status  New    Target Date  11/07/19      PT LONG TERM GOAL #2   Title  Pt will be able to walk 1 mile atleast 4 days a week without increase in Lt side low back pain.    Time  6    Period  Weeks    Status  New      PT LONG TERM GOAL #3   Title  Pt will report atleast 60% improvement in his pain from the start of PT.    Time  6    Period  Weeks    Status  New      PT LONG TERM GOAL #4   Title  Pt will have improved trunk strength evident by his ability to maintain a more upright posture throughout an entire session.    Time  6    Period  Weeks    Status  New            Plan -  10/08/19 9371    Clinical Impression Statement  Pt has no increase in pain since the evaluation 2 weeks ago. He has been inconsistent with his stretches but denies issues with these throughout the week. Session focused on therex to increase trunk flexibility. PT completed soft tissue techniques to address muscle spasm of the Lt lumbar paraspinals and QL. Pt did well with supine abdominal strengthening exercises, requiring initial PT cues to prevent increased abdominal pressure. HEP was updated to address this moving forward.    Personal Factors and Comorbidities  Age;Time since onset of injury/illness/exacerbation;Fitness    Examination-Activity Limitations  Stand;Locomotion Level    Examination-Participation Restrictions  Tour manager    Stability/Clinical Decision Making  Stable/Uncomplicated    Rehab Potential  Good    PT Frequency  2x / week    PT Duration  6 weeks    PT Treatment/Interventions  ADLs/Self Care Home Management;Aquatic Therapy;Cryotherapy;Electrical Stimulation;Moist Heat;Functional mobility training;Neuromuscular re-education;Balance training;Therapeutic exercise;Therapeutic activities;Patient/family education;Manual techniques;Dry needling;Taping    PT Next Visit Plan  manual and possible d/n to Lt QL if needed; progress trunk strength/flexibility    PT Home Exercise Plan  [facility]; pt completing series of stretches from MD    Consulted and Agree with Plan of Care  Patient       Patient will benefit from skilled therapeutic intervention in order to improve the following deficits and impairments:  Improper body mechanics, Pain, Postural dysfunction, Increased muscle spasms, Decreased mobility, Decreased activity tolerance, Decreased range of motion, Decreased strength, Hypomobility, Impaired flexibility, Decreased balance  Visit Diagnosis: Chronic left-sided low back pain without sciatica  Abnormal posture  Muscle spasm of back     Problem List Patient Active  Problem List   Diagnosis Date Noted  . Degenerative lumbar spinal stenosis 08/03/2019  . 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 Low back pain 10/30/2017  . Posterior tibial tendon dysfunction (PTTD) of right lower extremity 02/24/2017    9:28 AM,10/08/19 [name] PT, DPT [place] [facility]  [facility] [address] [place], [place], [address] Phone: [phone]   Fax:  [phone]  Name: Eliyohu Class MRN: [id number] Date of Birth: 1945-05-07

## 2019-10-15 ENCOUNTER — Encounter: Payer: Self-pay | Admitting: Physical Therapy

## 2019-10-15 ENCOUNTER — Ambulatory Visit: Payer: BC Managed Care – PPO | Admitting: Physical Therapy

## 2019-10-15 ENCOUNTER — Other Ambulatory Visit: Payer: Self-pay

## 2019-10-15 DIAGNOSIS — R293 Abnormal posture: Secondary | ICD-10-CM

## 2019-10-15 DIAGNOSIS — M545 Low back pain, unspecified: Secondary | ICD-10-CM

## 2019-10-15 DIAGNOSIS — G8929 Other chronic pain: Secondary | ICD-10-CM

## 2019-10-15 DIAGNOSIS — M62830 Muscle spasm of back: Secondary | ICD-10-CM

## 2019-10-15 NOTE — Therapy (Signed)
[facility] [address], [place], [place], [address] Phone: [phone]   Fax:  [phone]  Physical Therapy Treatment  Patient Details  Name: Melvin [name] MRN: [id number] Date of Birth: [birth year] Referring Provider (PT): Hulan Saas, DO   Encounter Date: 10/15/2019   PT End of Session - 10/15/19 0801    Visit Number 3    Date for PT Re-Evaluation 11/07/19    Authorization Type BCBS    Authorization Time Period 09/18/19 to 11/07/19    PT Start Time 0800    PT Stop Time 0844    PT Time Calculation (min) 44 min    Activity Tolerance Patient tolerated treatment well;No increased pain    Behavior During Therapy WFL for tasks assessed/performed           Past Medical History:  Diagnosis Date  . Cancer [facility])     Past Surgical History:  Procedure Laterality Date  . LYMPHADENECTOMY Bilateral 09/12/2012   Procedure: LYMPHADENECTOMY;  Surgeon: Alexis Frock, MD;  Location: [facility];  Service: Urology;  Laterality: Bilateral;  . ROBOT ASSISTED LAPAROSCOPIC RADICAL PROSTATECTOMY N/A 09/12/2012   Procedure: ROBOTIC ASSISTED LAPAROSCOPIC RADICAL PROSTATECTOMY;  Surgeon: Alexis Frock, MD;  Location: [facility];  Service: Urology;  Laterality: N/A;    There were no vitals filed for this visit.   Subjective Assessment - 10/15/19 0802    Subjective Patient reporting intermittent LBP. Right now I feel pretty good. Just standing is the worse.    Diagnostic tests Xray: scoliosis degenerative changes    Currently in Pain? No/denies                             [facility] Adult PT Treatment/Exercise - 10/15/19 0001      Self-Care   Self-Care Other Self-Care Comments    Other Self-Care Comments  self MFR with ball to left QL in standing      Lumbar Exercises: Stretches   Single Knee to Chest Stretch Left;Right;2 reps;20 seconds    Lower Trunk Rotation 10 seconds;5 reps    Prone on Elbows Stretch 1 rep;60 seconds     Other Lumbar Stretch Exercise seated lateral trunk flexion stretch 2x15 sec each direction    Other Lumbar Stretch Exercise SDLY left QL stretch over pillow x 2 min      Lumbar Exercises: Aerobic   Nustep Seat 12 L4 x 5 min      Lumbar Exercises: Seated   Other Seated Lumbar Exercises on table and dyna disc - lumbar flex/ext with TCs and VCs for correct form      Lumbar Exercises: Quadruped   Madcat/Old Horse 5 reps    Madcat/Old Horse Limitations difficult; TCs and VCs used for correct form      Manual Therapy   Manual Therapy Joint mobilization;Soft tissue mobilization;Myofascial release    Joint Mobilization Grade III-IV Lt UPAs and CPAs L2 to L5 x2 bouts     Soft tissue mobilization in SDLY to left QL and paraspinals    Myofascial Release TP release to left QL                  PT Education - 10/15/19 0849    Education Details HEP progressed; DN education; self MFR    Person(s) Educated Patient    Methods Explanation;Demonstration;Handout    Comprehension Verbalized understanding;Returned demonstration            PT Short Term  Goals - 09/18/19 1256      PT SHORT TERM GOAL #1   Title Pt will be independent and consistent with his initial HEP to improve posture awareness and decrease pain.    Time 3    Period Weeks    Status New    Target Date 10/09/19             PT Long Term Goals - 09/18/19 1257      PT LONG TERM GOAL #1   Title Pt will have improved lumbar spine mobility evident by his ability to extend beyond neutral in standing.    Time 6    Period Weeks    Status New    Target Date 11/07/19      PT LONG TERM GOAL #2   Title Pt will be able to walk 1 mile atleast 4 days a week without increase in Lt side low back pain.    Time 6    Period Weeks    Status New      PT LONG TERM GOAL #3   Title Pt will report atleast 60% improvement in his pain from the start of PT.    Time 6    Period Weeks    Status New      PT LONG TERM GOAL #4    Title Pt will have improved trunk strength evident by his ability to maintain a more upright posture throughout an entire session.    Time 6    Period Weeks    Status New                 Plan - 10/15/19 0850    Clinical Impression Statement HEP reviewed with patient and progressed. PT encouraged longer holds as pt only holding a few seconds. He tolerated manual therapy well and would benefit from DN. Education was provided. Good response to treatment today reporting increased mobility at end of session. He has difficulty with lumbar mobility. He would benefit from hip flexor stretching as well.    Personal Factors and Comorbidities Age;Time since onset of injury/illness/exacerbation;Fitness    Examination-Activity Limitations Stand;Locomotion Level    PT Treatment/Interventions ADLs/Self Care Home Management;Aquatic Therapy;Cryotherapy;Electrical Stimulation;Moist Heat;Functional mobility training;Neuromuscular re-education;Balance training;Therapeutic exercise;Therapeutic activities;Patient/family education;Manual techniques;Dry needling;Taping    PT Next Visit Plan manual and possible d/n to Lt QL if agreeable to pt; progress trunk strength/flexibility; add hip flexor stretch    PT Home Exercise Plan [facility]; pt completing series of stretches from MD           Patient will benefit from skilled therapeutic intervention in order to improve the following deficits and impairments:  Improper body mechanics, Pain, Postural dysfunction, Increased muscle spasms, Decreased mobility, Decreased activity tolerance, Decreased range of motion, Decreased strength, Hypomobility, Impaired flexibility, Decreased balance  Visit Diagnosis: Chronic left-sided low back pain without sciatica  Abnormal posture  Muscle spasm of back     Problem List Patient Active Problem List   Diagnosis Date Noted  . Degenerative lumbar spinal stenosis 08/03/2019  . 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 Low back pain  10/30/2017  . Posterior tibial tendon dysfunction (PTTD) of right lower extremity 02/24/2017    Madelyn Flavors PT 10/15/2019, 8:55 AM  [facility] [address], [place], [place], [address] Phone: [phone]   Fax:  [phone]  Name: Melvin [name] MRN: [id number] Date of Birth: 1945-11-07

## 2019-10-15 NOTE — Patient Instructions (Signed)
Access Code: [facility] URL: [email or web address] Date: 10/15/2019 Prepared by: Madelyn Flavors  Exercises Seated Sidebending - 1 x daily - 7 x weekly - 3 sets - 20 sec hold Supine Single Knee to Chest Stretch - 1 x daily - 7 x weekly - 5 reps - 10 seconds hold Supine Lower Trunk Rotation - 1 x daily - 7 x weekly - 10 reps Sidelying ITB Stretch off Table - 2 x daily - 7 x weekly - 1 sets - 3 reps - 60 seconds hold  Patient Education Trigger Point Dry Needling

## 2019-10-17 ENCOUNTER — Encounter: Payer: Self-pay | Admitting: Physical Therapy

## 2019-10-17 ENCOUNTER — Other Ambulatory Visit: Payer: Self-pay

## 2019-10-17 ENCOUNTER — Ambulatory Visit: Payer: BC Managed Care – PPO | Admitting: Physical Therapy

## 2019-10-17 DIAGNOSIS — M545 Low back pain, unspecified: Secondary | ICD-10-CM

## 2019-10-17 DIAGNOSIS — G8929 Other chronic pain: Secondary | ICD-10-CM

## 2019-10-17 DIAGNOSIS — R293 Abnormal posture: Secondary | ICD-10-CM

## 2019-10-17 DIAGNOSIS — M62830 Muscle spasm of back: Secondary | ICD-10-CM

## 2019-10-17 NOTE — Therapy (Signed)
[facility] [address] [place], [place], [address] Phone: [phone]   Fax:  [phone]  Physical Therapy Treatment  Patient Details  Name: Melvin [name] MRN: [id number] Date of Birth: 1946-03-24 Referring Provider (PT): Hulan Saas, DO   Encounter Date: 10/17/2019   PT End of Session - 10/17/19 0808    Visit Number 4    Date for PT Re-Evaluation 11/07/19    Authorization Type BCBS    Authorization Time Period 09/18/19 to 11/07/19    PT Start Time 0801    PT Stop Time 0840    PT Time Calculation (min) 39 min    Activity Tolerance Patient tolerated treatment well;No increased pain    Behavior During Therapy [facility] for tasks assessed/performed           Past Medical History:  Diagnosis Date   Cancer [facility])     Past Surgical History:  Procedure Laterality Date   LYMPHADENECTOMY Bilateral 09/12/2012   Procedure: LYMPHADENECTOMY;  Surgeon: Alexis Frock, MD;  [facility];  Service: Urology;  Laterality: Bilateral;   ROBOT ASSISTED LAPAROSCOPIC RADICAL PROSTATECTOMY N/A 09/12/2012   Procedure: ROBOTIC ASSISTED LAPAROSCOPIC RADICAL PROSTATECTOMY;  Surgeon: Alexis Frock, MD;  [facility];  Service: Urology;  Laterality: N/A;    There were no vitals filed for this visit.   Subjective Assessment - 10/17/19 0803    Subjective Pt states that things are going well. He is having some days when he feels good and some days when he has some soreness. It is worse when bending over.    Diagnostic tests Xray: scoliosis degenerative changes    Currently in Pain? No/denies                             [facility] Adult PT Treatment/Exercise - 10/17/19 0001      Self-Care   Other Self-Care Comments  lifting technique: chest up/back flat- avoiding prolonged rounding of the back with locked knees      Therapeutic Activites    Therapeutic Activities Lifting    Lifting lifitng technique x5 reps, x5  reps with lifting #5 dumbbell from 6" box       Lumbar Exercises: Stretches   Other Lumbar Stretch Exercise Lt and Rt TFL stretch 2x30 sec each- supine     Other Lumbar Stretch Exercise pec stretch in doorway 2x30 sec      Lumbar Exercises: Aerobic   Nustep Seat 12 interval program x8 min L2 PT present to discuss HEP concerns      Lumbar Exercises: Standing   Shoulder Extension Strengthening;Both;10 reps    Theraband Level (Shoulder Extension) Level 3 (Green)    Shoulder Extension Limitations x2 sets           BUE rows: Blue TB 2x15 reps        PT Education - 10/17/19 0838    Education Details lifting; HEP update    Person(s) Educated Patient    Methods Explanation;Handout;Verbal cues    Comprehension Verbalized understanding;Returned demonstration            PT Short Term Goals - 09/18/19 1256      PT SHORT TERM GOAL #1   Title Pt will be independent and consistent with his initial HEP to improve posture awareness and decrease pain.    Time 3    Period Weeks    Status New    Target Date 10/09/19  PT Long Term Goals - 09/18/19 1257      PT LONG TERM GOAL #1   Title Pt will have improved lumbar spine mobility evident by his ability to extend beyond neutral in standing.    Time 6    Period Weeks    Status New    Target Date 11/07/19      PT LONG TERM GOAL #2   Title Pt will be able to walk 1 mile atleast 4 days a week without increase in Lt side low back pain.    Time 6    Period Weeks    Status New      PT LONG TERM GOAL #3   Title Pt will report atleast 60% improvement in his pain from the start of PT.    Time 6    Period Weeks    Status New      PT LONG TERM GOAL #4   Title Pt will have improved trunk strength evident by his ability to maintain a more upright posture throughout an entire session.    Time 6    Period Weeks    Status New                 Plan - 10/17/19 0840    Clinical Impression Statement Pt has good and  bad days regarding low back pain. Mostly, he notes having pain during and following activities that require him to bend over. PT reviewed pt's technique with this and he demonstrates flexed trunk posture with knees extended. PT educated pt on safe and proper lifting technique and provided verbal/tactile cuing with initial review. Pt was able to demonstrate good understanding of lifting technique by the end of todays session. PTs HEP was updated today to promote further increase in trunk flexibility and strength. Will continue with current POC.    Personal Factors and Comorbidities Age;Time since onset of injury/illness/exacerbation;Fitness    Examination-Activity Limitations Stand;Locomotion Level    PT Treatment/Interventions ADLs/Self Care Home Management;Aquatic Therapy;Cryotherapy;Electrical Stimulation;Moist Heat;Functional mobility training;Neuromuscular re-education;Balance training;Therapeutic exercise;Therapeutic activities;Patient/family education;Manual techniques;Dry needling;Taping    PT Next Visit Plan manual and possible d/n to Lt QL if agreeable to pt; progress trunk strength/flexibility-posture strengthening    PT Home Exercise Plan [facility]; pt completing series of stretches from MD           Patient will benefit from skilled therapeutic intervention in order to improve the following deficits and impairments:  Improper body mechanics, Pain, Postural dysfunction, Increased muscle spasms, Decreased mobility, Decreased activity tolerance, Decreased range of motion, Decreased strength, Hypomobility, Impaired flexibility, Decreased balance  Visit Diagnosis: Chronic left-sided low back pain without sciatica  Abnormal posture  Muscle spasm of back     Problem List Patient Active Problem List   Diagnosis Date Noted   Degenerative lumbar spinal stenosis 08/03/2019   Ankle arthritis 05/31/2018   Low back pain 10/30/2017   Posterior tibial tendon dysfunction (PTTD) of right  lower extremity 02/24/2017    8:44 AM,10/17/19 [name] PT, DPT [facility] [address], [place], [place], [address] Phone: [phone]   Fax:  [phone]  Name: Melvin [name] MRN: [id number] Date of Birth: 06/07/1945

## 2019-10-17 NOTE — Patient Instructions (Signed)
Access Code: [email or web address]: [email or web address]: 06/17/2021Prepared by: [facility]  Seated Sidebending - 1 x daily - 7 x weekly - 3 sets - 20 sec hold  Supine ITB Stretch - 1 x daily - 7 x weekly - 2 sets - 30 seconds hold  Standing Shoulder Extension with Resistance - 1 x daily - 7 x weekly - 3 sets - 10 reps  Doorway Pec Stretch at 60 Elevation - 2 x daily - 7 x weekly - 3 reps - 30 seconds hold  [facility] [address], [place], [place] [address] Phone # [phone] Fax [phone]

## 2019-10-29 ENCOUNTER — Other Ambulatory Visit: Payer: Self-pay

## 2019-10-29 ENCOUNTER — Ambulatory Visit: Payer: BC Managed Care – PPO | Admitting: Physical Therapy

## 2019-10-29 DIAGNOSIS — M62830 Muscle spasm of back: Secondary | ICD-10-CM

## 2019-10-29 DIAGNOSIS — M545 Low back pain, unspecified: Secondary | ICD-10-CM

## 2019-10-29 DIAGNOSIS — G8929 Other chronic pain: Secondary | ICD-10-CM

## 2019-10-29 DIAGNOSIS — R293 Abnormal posture: Secondary | ICD-10-CM

## 2019-10-30 NOTE — Therapy (Signed)
[facility] [address] [place], [place], [address] Phone: [phone]   Fax:  [phone]  Physical Therapy Treatment  Patient Details  Name: Rayjon Wery MRN: [id number] Date of Birth: 09-20-1945 Referring Provider (PT): Hulan Saas, DO   Encounter Date: 10/29/2019   PT End of Session - 10/30/19 0748    Visit Number 5    Date for PT Re-Evaluation 11/07/19    Authorization Type BCBS    Authorization Time Period 09/18/19 to 11/07/19    PT Start Time 1615    PT Stop Time 1655    PT Time Calculation (min) 40 min    Activity Tolerance Patient tolerated treatment well;No increased pain    Behavior During Therapy WFL for tasks assessed/performed           Past Medical History:  Diagnosis Date  . Cancer [facility])     Past Surgical History:  Procedure Laterality Date  . LYMPHADENECTOMY Bilateral 09/12/2012   Procedure: LYMPHADENECTOMY;  Surgeon: Alexis Frock, MD;  [facility]: [facility];  Service: Urology;  Laterality: Bilateral;  . ROBOT ASSISTED LAPAROSCOPIC RADICAL PROSTATECTOMY N/A 09/12/2012   Procedure: ROBOTIC ASSISTED LAPAROSCOPIC RADICAL PROSTATECTOMY;  Surgeon: Alexis Frock, MD;  [facility];  Service: Urology;  Laterality: N/A;    There were no vitals filed for this visit.   Subjective Assessment - 10/30/19 0747    Subjective Pt says things are going well. He is improving but still feels he could benefit from PT moving forward. He is doing his HEP and feels that he does better when he stretches in the mornings.    Diagnostic tests Xray: scoliosis degenerative changes    Currently in Pain? No/denies                             [facility] Adult PT Treatment/Exercise - 10/30/19 0001      Lumbar Exercises: Stretches   Other Lumbar Stretch Exercise long sitting hip IR/ER 90/90 position x15 reps       Lumbar Exercises: Aerobic   Nustep Seat 12 x8 min PT present to discuss progress        Lumbar Exercises: Standing   Lifting From 12";15 reps   mat table    Lifting Weights (lbs) 5    Lifting Limitations PT cues for increased thoracic extension/lumbar extension    Other Standing Lumbar Exercises Rt lateral shift correction in doorway 2x10 reps       Lumbar Exercises: Seated   Other Seated Lumbar Exercises hip hinge with UE on green physioball x10 reps     Other Seated Lumbar Exercises lumbar and thoracic extension over foam roll x10 reps each position                   PT Education - 10/29/19 1704    Education Details technique with therex    Person(s) Educated Patient    Methods Explanation    Comprehension Verbalized understanding            PT Short Term Goals - 10/29/19 1619      PT SHORT TERM GOAL #1   Title Pt will be independent and consistent with his initial HEP to improve posture awareness and decrease pain.    Time 3    Period Weeks    Status Achieved    Target Date 10/09/19             PT Long Term  Goals - 10/29/19 1619      PT LONG TERM GOAL #1   Title Pt will have improved lumbar spine mobility evident by his ability to extend beyond neutral in standing.    Time 6    Period Weeks    Status New      PT LONG TERM GOAL #2   Title Pt will be able to walk 1 mile atleast 4 days a week without increase in Lt side low back pain.    Time 6    Period Weeks    Status New      PT LONG TERM GOAL #3   Title Pt will report atleast 60% improvement in his pain from the start of PT.    Time 6    Period Weeks    Status New      PT LONG TERM GOAL #4   Title Pt will have improved trunk strength evident by his ability to maintain a more upright posture throughout an entire session.    Time 6    Period Weeks    Status New                 Plan - 10/30/19 0749    Clinical Impression Statement Pt continues to make progress and reports increase in mobility during the day with HEP adherence in the mornings. He reports still having Lt  sided low back pain with prolonged activity and lifting. PT educated pt on proper lifting technique. He verbalized understanding of this but had difficulty demonstrating understanding secondary to lumbar and thoracic spine mobility restrictions. Pt would continue to benefit from skilled PT to address these restrictions and progress trunk strength.    Personal Factors and Comorbidities Age;Time since onset of injury/illness/exacerbation;Fitness    Examination-Activity Limitations Stand;Locomotion Level    PT Treatment/Interventions ADLs/Self Care Home Management;Aquatic Therapy;Cryotherapy;Electrical Stimulation;Moist Heat;Functional mobility training;Neuromuscular re-education;Balance training;Therapeutic exercise;Therapeutic activities;Patient/family education;Manual techniques;Dry needling;Taping    PT Next Visit Plan re-eval; manual and possible d/n to Lt QL if agreeable to pt; progress trunk strength/flexibility-posture strengthening    PT Home Exercise Plan [facility]; pt completing series of stretches from MD           Patient will benefit from skilled therapeutic intervention in order to improve the following deficits and impairments:  Improper body mechanics, Pain, Postural dysfunction, Increased muscle spasms, Decreased mobility, Decreased activity tolerance, Decreased range of motion, Decreased strength, Hypomobility, Impaired flexibility, Decreased balance  Visit Diagnosis: Chronic left-sided low back pain without sciatica  Abnormal posture  Muscle spasm of back     Problem List Patient Active Problem List   Diagnosis Date Noted  . Degenerative lumbar spinal stenosis 08/03/2019  . 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 Low back pain 10/30/2017  . Posterior tibial tendon dysfunction (PTTD) of right lower extremity 02/24/2017   7:55 AM,10/30/19 [name] PT, DPT [facility] [address] [place], [place], [address] Phone: [phone]   Fax:  [phone]  Name: Jader Desai MRN: [id number] Date of Birth: 1945/05/24

## 2019-10-31 ENCOUNTER — Ambulatory Visit: Payer: BC Managed Care – PPO | Attending: Family Medicine | Admitting: Physical Therapy

## 2019-10-31 ENCOUNTER — Encounter: Payer: Self-pay | Admitting: Physical Therapy

## 2019-10-31 ENCOUNTER — Other Ambulatory Visit: Payer: Self-pay

## 2019-10-31 DIAGNOSIS — M62830 Muscle spasm of back: Secondary | ICD-10-CM | POA: Insufficient documentation

## 2019-10-31 DIAGNOSIS — G8929 Other chronic pain: Secondary | ICD-10-CM

## 2019-10-31 DIAGNOSIS — M545 Low back pain: Secondary | ICD-10-CM | POA: Insufficient documentation

## 2019-10-31 DIAGNOSIS — R293 Abnormal posture: Secondary | ICD-10-CM | POA: Diagnosis present

## 2019-10-31 NOTE — Therapy (Signed)
[facility] [address] [place], [place], [address] Phone: [phone]   Fax:  [phone]  Physical Therapy Treatment/Re-evaluation  Patient Details  Name: Melvin [name] MRN: [id number] Date of Birth: 01-03-1946 Referring Provider (PT): Hulan Saas, DO   Encounter Date: 10/31/2019   PT End of Session - 10/31/19 1410    Visit Number 6    Date for PT Re-Evaluation 11/07/19    Authorization Type BCBS    Authorization Time Period 11/08/19 to 12/24/19    PT Start Time 1400    PT Stop Time 1440    PT Time Calculation (min) 40 min    Activity Tolerance Patient tolerated treatment well;No increased pain    Behavior During Therapy WFL for tasks assessed/performed           Past Medical History:  Diagnosis Date  . Cancer [facility])     Past Surgical History:  Procedure Laterality Date  . LYMPHADENECTOMY Bilateral 09/12/2012   Procedure: LYMPHADENECTOMY;  Surgeon: Alexis Frock, MD;  Location: [facility];  Service: Urology;  Laterality: Bilateral;  . ROBOT ASSISTED LAPAROSCOPIC RADICAL PROSTATECTOMY N/A 09/12/2012   Procedure: ROBOTIC ASSISTED LAPAROSCOPIC RADICAL PROSTATECTOMY;  Surgeon: Alexis Frock, MD;  Location: [facility];  Service: Urology;  Laterality: N/A;    There were no vitals filed for this visit.   Subjective Assessment - 10/31/19 1404    Subjective Pt states that things are going ok. He had increased back soreness yesterday but denies pain at this time. He feels his pain is atleast 50% better.    Diagnostic tests Xray: scoliosis degenerative changes    Currently in Pain? No/denies              [facility] PT Assessment - 10/31/19 0001      Assessment   Medical Diagnosis Degenerative changes lumbar spine    Referring Provider (PT) Hulan Saas, DO    Onset Date/Surgical Date --   chronic issue   Next MD Visit none yet      Precautions   Precautions None      Restrictions   Weight Bearing Restrictions No        [place] residence      Prior Function   Leisure walking ~1 mile, 4-5 days a week       Cognition   Overall Cognitive Status Within Functional Limits for tasks assessed      Observation/Other Assessments   Observations pt standing with Rt lateral shift of the trunk    Focus on Therapeutic Outcomes (FOTO)  --      AROM   Lumbar Flexion WNL    Lumbar Extension to neutral, end range discomfort     Lumbar - Right Rotation 50% limited     Lumbar - Left Rotation 50% limited, discomfort Lt side low back       Flexibility   Soft Tissue Assessment /Muscle Length yes    Hamstrings WFL      Palpation   Palpation comment palpable trigger point and tenderness Lt QL, lumbar paraspinals                          [facility] Adult PT Treatment/Exercise - 10/31/19 0001      Lumbar Exercises: Standing   Other Standing Lumbar Exercises wide BOS pallof press #15 x10 reps each direction      Lumbar Exercises: Seated   Other Seated Lumbar Exercises diagonal  lifts greenTB 2x10 reps each direction- PT cuing to increase lumbar lordosis      Lumbar Exercises: Supine   Other Supine Lumbar Exercises low trunk rotation with LE on red physioball x15 reps     Other Supine Lumbar Exercises B knees to chest with feet on red physioball x15 reps       Lumbar Exercises: Sidelying   Hip Abduction Both;10 reps    Hip Abduction Limitations x2 sets                  PT Education - 10/31/19 1436    Education Details updated HEP    Person(s) Educated Patient    Methods Explanation;Handout    Comprehension Verbalized understanding;Returned demonstration            PT Short Term Goals - 10/31/19 1417      PT SHORT TERM GOAL #1   Title Pt will be independent and consistent with his initial HEP to improve posture awareness and decrease pain.    Time 3    Period Weeks    Status Achieved    Target Date 10/09/19             PT Long Term  Goals - 10/31/19 1417      PT LONG TERM GOAL #1   Title Pt will have improved lumbar spine mobility evident by his ability to extend beyond neutral in standing.    Time 6    Period Weeks    Status Achieved      PT LONG TERM GOAL #2   Title Pt will be able to walk 1 mile atleast 4 days a week without increase in Lt side low back pain.    Baseline 2-3 miles    Time 6    Period Weeks    Status Partially Met      PT LONG TERM GOAL #3   Title Pt will report atleast 60% improvement in his pain from the start of PT.    Baseline 50% improved    Time 6    Period Weeks    Status New      PT LONG TERM GOAL #4   Title Pt will have improved trunk strength evident by his ability to maintain a more upright posture throughout an entire session.    Time 6    Period Weeks    Status Partially Met                 Plan - 10/31/19 1437    Clinical Impression Statement Pt is making steady progress towards his goals. He feels 50% improved with his back pain during the day. Pt's active lumbar extension is improved beyond neutral. Pt still has limitations in lumbar and thoracic spine mobility lacking more than 50% rotation in both. Pt is not up to his 4 miles a week without discomfort but is close to 2-3 miles currently. He has been unable to attend all his scheduled PT appointments but is making good progress despite this. He would continue to benefit from skilled PT to address remaining limitations in flexibility, strength and understanding of body mechanics.    Personal Factors and Comorbidities Age;Time since onset of injury/illness/exacerbation;Fitness    Examination-Activity Limitations Stand;Locomotion Level    PT Treatment/Interventions ADLs/Self Care Home Management;Aquatic Therapy;Cryotherapy;Electrical Stimulation;Moist Heat;Functional mobility training;Neuromuscular re-education;Balance training;Therapeutic exercise;Therapeutic activities;Patient/family education;Manual techniques;Dry  needling;Taping    PT Next Visit Plan seated chop/lift progression; manual and possible d/n to Lt QL if agreeable to  pt; progress trunk strength/flexibility-posture strengthening    PT Home Exercise Plan [facility]; pt completing series of stretches from MD           Patient will benefit from skilled therapeutic intervention in order to improve the following deficits and impairments:  Improper body mechanics, Pain, Postural dysfunction, Increased muscle spasms, Decreased mobility, Decreased activity tolerance, Decreased range of motion, Decreased strength, Hypomobility, Impaired flexibility, Decreased balance  Visit Diagnosis: Chronic left-sided low back pain without sciatica  Abnormal posture  Muscle spasm of back     Problem List Patient Active Problem List   Diagnosis Date Noted  . Degenerative lumbar spinal stenosis 08/03/2019  . 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 Low back pain 10/30/2017  . Posterior tibial tendon dysfunction (PTTD) of right lower extremity 02/24/2017    2:43 PM,10/31/19 [name] PT, DPT [place] [facility] [address] [place], [place], [address] Phone: [phone]   Fax:  [phone]  Name: Melvin [name] MRN: [id number] Date of Birth: 26-Jan-1946

## 2019-10-31 NOTE — Patient Instructions (Signed)
Access Code: [email or web address] URL: [email or web address] Date: 10/31/2019 Prepared by: [place]  Exercises Seated Sidebending - 1 x daily - 7 x weekly - 3 sets - 20 sec hold Standing Shoulder Extension with Resistance - 1 x daily - 7 x weekly - 3 sets - 10 reps Doorway Pec Stretch at 60 Elevation - 2 x daily - 7 x weekly - 3 reps - 30 seconds hold Seated Chop and Lift with Ball - 1 x daily - 7 x weekly - 2 sets - 10 reps  [facility] [address] [place], [place] [address] Phone # [phone] Fax [phone]

## 2019-11-05 ENCOUNTER — Encounter: Payer: BC Managed Care – PPO | Admitting: Physical Therapy

## 2019-11-07 ENCOUNTER — Other Ambulatory Visit: Payer: Self-pay

## 2019-11-07 ENCOUNTER — Ambulatory Visit: Payer: BC Managed Care – PPO | Admitting: Physical Therapy

## 2019-11-07 ENCOUNTER — Encounter: Payer: Self-pay | Admitting: Physical Therapy

## 2019-11-07 DIAGNOSIS — G8929 Other chronic pain: Secondary | ICD-10-CM

## 2019-11-07 DIAGNOSIS — M62830 Muscle spasm of back: Secondary | ICD-10-CM

## 2019-11-07 DIAGNOSIS — M545 Low back pain: Secondary | ICD-10-CM | POA: Diagnosis not present

## 2019-11-07 DIAGNOSIS — R293 Abnormal posture: Secondary | ICD-10-CM

## 2019-11-07 NOTE — Therapy (Addendum)
[facility] [address] [place], [place], [address] Phone: [phone]   Fax:  [phone]  Physical Therapy Treatment  Patient Details  Name: Melvin [name] MRN: [id number] Date of Birth: Mar 24, 1946 Referring Provider (PT): Hulan Saas, DO   Encounter Date: 11/07/2019   PT End of Session - 11/07/19 1104    Visit Number 7    Date for PT Re-Evaluation 11/07/19    Authorization Type BCBS    Authorization Time Period 11/08/19 to 12/24/19    PT Start Time 1100    PT Stop Time 1143    PT Time Calculation (min) 43 min    Activity Tolerance Patient tolerated treatment well;No increased pain    Behavior During Therapy WFL for tasks assessed/performed           Past Medical History:  Diagnosis Date  . Cancer [facility])     Past Surgical History:  Procedure Laterality Date  . LYMPHADENECTOMY Bilateral 09/12/2012   Procedure: LYMPHADENECTOMY;  Surgeon: Alexis Frock, MD;  [facility];  Service: Urology;  Laterality: Bilateral;  . ROBOT ASSISTED LAPAROSCOPIC RADICAL PROSTATECTOMY N/A 09/12/2012   Procedure: ROBOTIC ASSISTED LAPAROSCOPIC RADICAL PROSTATECTOMY;  Surgeon: Alexis Frock, MD;  [facility];  Service: Urology;  Laterality: N/A;    There were no vitals filed for this visit.   Subjective Assessment - 11/07/19 1103    Subjective Pt states that things are going well. No complaints of back pain currently.    Diagnostic tests Xray: scoliosis degenerative changes    Currently in Pain? No/denies                             [facility] Adult PT Treatment/Exercise - 11/07/19 0001      Lumbar Exercises: Aerobic   Nustep x7 min intervals: PT present to discuss HEP updates       Lumbar Exercises: Standing   Row Strengthening;10 reps;Both;Theraband    Theraband Level (Row) Level 4 (Blue)    Row Limitations x3 sets       Lumbar Exercises: Seated   Sit to Stand 10 reps    Sit to Stand Limitations  3 sec eccentric control, 2nd set with UE behind the head    Other Seated Lumbar Exercises seated blue weighted ball diagonal lifts x10 reps each side     Other Seated Lumbar Exercises seated pallof press with yellow TB 2x10 reps                   PT Education - 11/07/19 1147    Education Details updated HEP    Person(s) Educated Patient    Methods Explanation;Handout    Comprehension Verbalized understanding;Returned demonstration            PT Short Term Goals - 10/31/19 1417      PT SHORT TERM GOAL #1   Title Pt will be independent and consistent with his initial HEP to improve posture awareness and decrease pain.    Time 3    Period Weeks    Status Achieved    Target Date 10/09/19             PT Long Term Goals - 10/31/19 1417      PT LONG TERM GOAL #1   Title Pt will have improved lumbar spine mobility evident by his ability to extend beyond neutral in standing.    Time 6    Period Weeks  Status Achieved      PT LONG TERM GOAL #2   Title Pt will be able to walk 1 mile atleast 4 days a week without increase in Lt side low back pain.    Baseline 2-3 miles    Time 6    Period Weeks    Status Partially Met      PT LONG TERM GOAL #3   Title Pt will report atleast 60% improvement in his pain from the start of PT.    Baseline 50% improved    Time 6    Period Weeks    Status New      PT LONG TERM GOAL #4   Title Pt will have improved trunk strength evident by his ability to maintain a more upright posture throughout an entire session.    Time 6    Period Weeks    Status Partially Met                 Plan - 11/07/19 1105                                                  Pt is making progress towards improved mobility and activity tolerance. He was able to stand still for up to 20-25 minutes the other day without increase in back pain. He is working diligently on his HEP and feels his flexibility and posture awareness continues to improve. Session  focused on therex to increased seated posture. He required minimal cues to address lumbar flexion but was able to demonstrate understanding and attempts at correcting this. Ended without increase in pain. Will continue with current POC.   Personal Factors and Comorbidities Age;Time since onset of injury/illness/exacerbation;Fitness    Examination-Activity Limitations Stand;Locomotion Level    PT Treatment/Interventions ADLs/Self Care Home Management;Aquatic Therapy;Cryotherapy;Electrical Stimulation;Moist Heat;Functional mobility training;Neuromuscular re-education;Balance training;Therapeutic exercise;Therapeutic activities;Patient/family education;Manual techniques;Dry needling;Taping    PT Next Visit Plan seated chop/lift progression; manual and possible d/n to Lt QL if agreeable to pt; progress trunk strength/flexibility-posture strengthening    PT Home Exercise Plan [facility]; pt completing series of stretches from MD           Patient will benefit from skilled therapeutic intervention in order to improve the following deficits and impairments:  Improper body mechanics, Pain, Postural dysfunction, Increased muscle spasms, Decreased mobility, Decreased activity tolerance, Decreased range of motion, Decreased strength, Hypomobility, Impaired flexibility, Decreased balance  Visit Diagnosis: Chronic left-sided low back pain without sciatica  Abnormal posture  Muscle spasm of back     Problem List Patient Active Problem List   Diagnosis Date Noted  . Degenerative lumbar spinal stenosis 08/03/2019  . 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 Low back pain 10/30/2017  . Posterior tibial tendon dysfunction (PTTD) of right lower extremity 02/24/2017    1:02 PM,11/07/19 [name] PT, DPT [place] [facility] [place] [facility] [address], [place], [place], [address] Phone: [phone]   Fax:   [phone]  Name: Melvin [name] MRN: [id number] Date of Birth: [birth year]  *addendum made to pull over plan information into note  8:07 AM,11/13/19 [name] PT, [place] at [place]

## 2019-11-07 NOTE — Patient Instructions (Signed)
Access Code: [email or web address]: [email or web address]: 07/08/2021Prepared by: [place]  Standing Shoulder Extension with Resistance - 1 x daily - 7 x weekly - 3 sets - 10 reps  Doorway Pec Stretch at 60 Elevation - 2 x daily - 7 x weekly - 3 reps - 30 seconds hold  Seated Chop and Lift with Ball - 1 x daily - 7 x weekly - 2 sets - 10 reps  Standing Anti-Rotation Press with Anchored Resistance - 1 x daily - 7 x weekly - 2 sets - 10 reps  Seated Thoracic Extension Arms Overhead - 2 x daily - 7 x weekly - 10 reps   [facility] [address], [place], [place] [address] Phone # [phone] Fax [phone]

## 2019-11-13 ENCOUNTER — Other Ambulatory Visit: Payer: Self-pay

## 2019-11-13 ENCOUNTER — Ambulatory Visit: Payer: BC Managed Care – PPO | Admitting: Physical Therapy

## 2019-11-13 ENCOUNTER — Encounter: Payer: Self-pay | Admitting: Physical Therapy

## 2019-11-13 DIAGNOSIS — M545 Low back pain, unspecified: Secondary | ICD-10-CM

## 2019-11-13 DIAGNOSIS — M62830 Muscle spasm of back: Secondary | ICD-10-CM

## 2019-11-13 DIAGNOSIS — R293 Abnormal posture: Secondary | ICD-10-CM

## 2019-11-13 DIAGNOSIS — G8929 Other chronic pain: Secondary | ICD-10-CM

## 2019-11-13 NOTE — Therapy (Signed)
[facility] [address], [place], [place], [address] Phone: [phone]   Fax:  [phone]  Physical Therapy Treatment  Patient Details  Name: Melvin [name] MRN: [id number] Date of Birth: Feb 06, 1946 Referring Provider (PT): Hulan Saas, DO   Encounter Date: 11/13/2019   PT End of Session - 11/13/19 1145    Visit Number 8    Date for PT Re-Evaluation 11/07/19    Authorization Type BCBS    Authorization Time Period 11/08/19 to 12/24/19    PT Start Time 1101    PT Stop Time 1144    PT Time Calculation (min) 43 min    Activity Tolerance Patient tolerated treatment well;No increased pain    Behavior During Therapy WFL for tasks assessed/performed           Past Medical History:  Diagnosis Date  . Cancer [facility])     Past Surgical History:  Procedure Laterality Date  . LYMPHADENECTOMY Bilateral 09/12/2012   Procedure: LYMPHADENECTOMY;  Surgeon: Alexis Frock, MD;  [facility];  Service: Urology;  Laterality: Bilateral;  . ROBOT ASSISTED LAPAROSCOPIC RADICAL PROSTATECTOMY N/A 09/12/2012   Procedure: ROBOTIC ASSISTED LAPAROSCOPIC RADICAL PROSTATECTOMY;  Surgeon: Alexis Frock, MD;  [facility]: [facility];  Service: Urology;  Laterality: N/A;    There were no vitals filed for this visit.   Subjective Assessment - 11/13/19 1101    Subjective Pt has no complaints at this time. He has been trying some of the new exercises we reviewed last session.    Diagnostic tests Xray: scoliosis degenerative changes    Currently in Pain? No/denies                             [facility] Adult PT Treatment/Exercise - 11/13/19 0001      Lumbar Exercises: Stretches   Hip Flexor Stretch Left;Right;2 reps;20 seconds    Hip Flexor Stretch Limitations at steps       Lumbar Exercises: Standing   Shoulder Extension Strengthening;Both;15 reps    Theraband Level (Shoulder Extension) Level 4 (Blue)    Shoulder Extension  Limitations x2 sets      Lumbar Exercises: Seated   Other Seated Lumbar Exercises seated blue weighted ball diagonal lifts x10 reps each side     Other Seated Lumbar Exercises trunk rotation with yellow TB 2x10 reps, adductor ball squeeze      Manual Therapy   Soft tissue mobilization using addaday Lt QL/lumbar paraspinals                  PT Education - 11/13/19 1145    Education Details adjustments to HEP    Person(s) Educated Patient    Methods Explanation;Handout    Comprehension Returned demonstration;Verbalized understanding            PT Short Term Goals - 10/31/19 1417      PT SHORT TERM GOAL #1   Title Pt will be independent and consistent with his initial HEP to improve posture awareness and decrease pain.    Time 3    Period Weeks    Status Achieved    Target Date 10/09/19             PT Long Term Goals - 10/31/19 1417      PT LONG TERM GOAL #1   Title Pt will have improved lumbar spine mobility evident by his ability to extend beyond neutral in standing.    Time  6    Period Weeks    Status Achieved      PT LONG TERM GOAL #2   Title Pt will be able to walk 1 mile atleast 4 days a week without increase in Lt side low back pain.    Baseline 2-3 miles    Time 6    Period Weeks    Status Partially Met      PT LONG TERM GOAL #3   Title Pt will report atleast 60% improvement in his pain from the start of PT.    Baseline 50% improved    Time 6    Period Weeks    Status New      PT LONG TERM GOAL #4   Title Pt will have improved trunk strength evident by his ability to maintain a more upright posture throughout an entire session.    Time 6    Period Weeks    Status Partially Met                 Plan - 11/13/19 1147    Clinical Impression Statement Pt is consistently performing his HEP without difficulty. Pt was able to complete standing therex during the session. He has compensation of posterior pelvic tilt but was able to relax the  gluteals with PT cuing. PT completed soft tissue mobilization with addaday instrument and encouraged pt to use his at home as needed. He had good understanding of HEP updates made today.    Personal Factors and Comorbidities Age;Time since onset of injury/illness/exacerbation;Fitness    Examination-Activity Limitations Stand;Locomotion Level    PT Treatment/Interventions ADLs/Self Care Home Management;Aquatic Therapy;Cryotherapy;Electrical Stimulation;Moist Heat;Functional mobility training;Neuromuscular re-education;Balance training;Therapeutic exercise;Therapeutic activities;Patient/family education;Manual techniques;Dry needling;Taping    PT Next Visit Plan standing therex; manual and possible d/n to Lt QL if agreeable to pt; progress trunk strength/flexibility-posture strengthening    PT Home Exercise Plan [facility]; pt completing series of stretches from MD           Patient will benefit from skilled therapeutic intervention in order to improve the following deficits and impairments:  Improper body mechanics, Pain, Postural dysfunction, Increased muscle spasms, Decreased mobility, Decreased activity tolerance, Decreased range of motion, Decreased strength, Hypomobility, Impaired flexibility, Decreased balance  Visit Diagnosis: Chronic left-sided low back pain without sciatica  Abnormal posture  Muscle spasm of back     Problem List Patient Active Problem List   Diagnosis Date Noted  . Degenerative lumbar spinal stenosis 08/03/2019  . 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 Low back pain 10/30/2017  . Posterior tibial tendon dysfunction (PTTD) of right lower extremity 02/24/2017    12:16 PM,11/13/19 [name] PT, DPT [place] [facility] [address], [place], [place], [address] Phone: [phone]   Fax:  [phone]  Name: Melvin [name] MRN: [id number] Date of  Birth: 08-31-1945

## 2019-11-13 NOTE — Patient Instructions (Signed)
Access Code: [email or web address]: [email or web address]: 07/14/2021Prepared by: [place]  Standing Shoulder Extension with Resistance - 1 x daily - 7 x weekly - 3 sets - 10 reps  Doorway Pec Stretch at 60 Elevation - 2 x daily - 7 x weekly - 3 reps - 30 seconds hold  Standing Anti-Rotation Press with Anchored Resistance - 1 x daily - 7 x weekly - 2 sets - 10 reps  Seated Thoracic Extension Arms Overhead - 2 x daily - 7 x weekly - 10 reps  [facility] [address], [place], [place] [address] Phone # [phone] Fax [phone]

## 2019-11-14 ENCOUNTER — Ambulatory Visit: Payer: BC Managed Care – PPO | Admitting: Family Medicine

## 2019-11-19 ENCOUNTER — Encounter: Payer: Self-pay | Admitting: Family Medicine

## 2019-11-19 ENCOUNTER — Other Ambulatory Visit: Payer: Self-pay

## 2019-11-19 ENCOUNTER — Ambulatory Visit (INDEPENDENT_AMBULATORY_CARE_PROVIDER_SITE_OTHER): Payer: BC Managed Care – PPO | Admitting: Family Medicine

## 2019-11-19 DIAGNOSIS — M48061 Spinal stenosis, lumbar region without neurogenic claudication: Secondary | ICD-10-CM

## 2019-11-19 NOTE — Progress Notes (Signed)
[place] [address] [place] Phone: [phone] Subjective:   I Melvin [name] am serving as a Education administrator for Dr. Hulan Saas.  This visit occurred during the SARS-CoV-2 public health emergency.  Safety protocols were in place, including screening questions prior to the visit, additional usage of staff PPE, and extensive cleaning of exam room while observing appropriate contact time as indicated for disinfecting solutions.   I'm seeing this patient by the request  of:  Orpah Melter, MD  CC: Low back pain follow-up  [id number]   09/17/2019 Patient does have spinal stenosis.  Minimal change from previous exam at this time.  I do believe that formal physical therapy would be beneficial and patient will be referred today.  Discontinue of the gabapentin secondary to side effects but started on Cymbalta.  This is a chronic problem with mild exacerbation.  Worsening symptoms advanced imaging and potentially epidurals will be needed.  Hopefully patient will respond to conservative therapy.  Follow-up again in 6 weeks  11/19/2019 Melvin [name] is a 74 y.o. male coming in with complaint of back pain. Patient states he is better. Going to PT 1-2 times a week and believes it helps. States his back is achy as the day goes on. States he wants to talk about the spinal issues.  Patient states that it is more of the abnormality or asymmetry that he notices in the mirror that is more concerning.  Patient denies any fevers, chills, any abnormal weight loss recently.  No bowel or bladder discomfort or changes recently either.  Patient's x-rays did show a significant arthritic changes.  Patient is noncompliant on medications not taking the gabapentin Cymbalta or the prednisone      Past Medical History:  Diagnosis Date  . Cancer [facility])    Past Surgical History:  Procedure Laterality Date  . LYMPHADENECTOMY Bilateral 09/12/2012   Procedure: LYMPHADENECTOMY;   Surgeon: Alexis Frock, MD;  Location: [facility];  Service: Urology;  Laterality: Bilateral;  . ROBOT ASSISTED LAPAROSCOPIC RADICAL PROSTATECTOMY N/A 09/12/2012   Procedure: ROBOTIC ASSISTED LAPAROSCOPIC RADICAL PROSTATECTOMY;  Surgeon: Alexis Frock, MD;  Location: [facility];  Service: Urology;  Laterality: N/A;   Social History   Socioeconomic History  . Marital status: Divorced    Spouse name: Not on file  . Number of children: Not on file  . Years of education: Not on file  . Highest education level: Not on file  Occupational History  . Not on file  Tobacco Use  . Smoking status: Former Smoker    Quit date: 09/12/1992    Years since quitting: 27.2  . Smokeless tobacco: Never Used  Substance and Sexual Activity  . Alcohol use: Not on file  . Drug use: Not on file  . Sexual activity: Not on file  Other Topics Concern  . Not on file  Social History Narrative  . Not on file   Social Determinants of Health   Financial Resource Strain:   . Difficulty of Paying Living Expenses:   Food Insecurity:   . Worried About Charity fundraiser in the Last Year:   . Arboriculturist in the Last Year:   Transportation Needs:   . Film/video editor (Medical):   Marland Kitchen Lack of Transportation (Non-Medical):   Physical Activity:   . Days of Exercise per Week:   . Minutes of Exercise per Session:   Stress:   . Feeling of Stress :   Social  Connections:   . Frequency of Communication with Friends and Family:   . Frequency of Social Gatherings with Friends and Family:   . Attends Religious Services:   . Active Member of Clubs or Organizations:   . Attends Archivist Meetings:   Marland Kitchen Marital Status:    Allergies  Allergen Reactions  . Sulfa Antibiotics Hives   History reviewed. No pertinent family history.  Current Outpatient Medications (Endocrine & Metabolic):  .  predniSONE (DELTASONE) 20 MG tablet, Take 1 tablet (20 mg total) by mouth daily with breakfast. .  predniSONE  (STERAPRED UNI-PAK 21 TAB) 10 MG (21) TBPK tablet, Take as directed   Current Outpatient Medications (Respiratory):  .  mometasone (NASONEX) 50 MCG/ACT nasal spray, Place 2 sprays into the nose daily as needed (for allergies).  Current Outpatient Medications (Analgesics):  .  diclofenac (VOLTAREN) 75 MG EC tablet, TAKE 1 TABLET BY MOUTH TWICE DAILY. Marland Kitchen  HYDROcodone-acetaminophen (NORCO) 5-325 MG per tablet, Take 1-2 tablets by mouth every 6 (six) hours as needed for pain.   Current Outpatient Medications (Other):  .  ciprofloxacin (CIPRO) 500 MG tablet, Take 1 tablet (500 mg total) by mouth 2 (two) times daily. Start day prior to office visit for foley removal .  diclofenac sodium (VOLTAREN) 1 % GEL, Apply 2 g topically 4 (four) times daily. .  DULoxetine (CYMBALTA) 20 MG capsule, Take 1 capsule (20 mg total) by mouth daily. .  fluocinonide cream (LIDEX) 9.32 %, Apply 1 application topically 2 (two) times daily as needed. For skin rash .  latanoprost (XALATAN) 0.005 % ophthalmic solution, Place 1 drop into both eyes at bedtime. Marland Kitchen  oxybutynin (DITROPAN) 5 MG tablet, Take 1 tablet (5 mg total) by mouth every 8 (eight) hours as needed. For bladder spasms while catheter in place .  ranitidine (ZANTAC) 150 MG tablet, Take 150 mg by mouth 2 (two) times daily. .  sennosides-docusate sodium (SENOKOT-S) 8.6-50 MG tablet, Take 1 tablet by mouth 2 (two) times daily. While taking pain meds to prevent constipation .  Vitamin D, Ergocalciferol, (DRISDOL) 1.25 MG (50000 UNIT) CAPS capsule, Take 1 capsule (50,000 Units total) by mouth every 7 (seven) days.   Reviewed prior external information including notes and imaging from  primary care provider As well as notes that were available from care everywhere and other healthcare systems.  Past medical history, social, surgical and family history all reviewed in electronic medical record.  No pertanent information unless stated regarding to the chief  complaint.   Review of Systems:  No headache, visual changes, nausea, vomiting, diarrhea, constipation, dizziness, abdominal pain, skin rash, fevers, chills, night sweats, weight loss, swollen lymph nodes, body aches, joint swelling, chest pain, shortness of breath, mood changes. POSITIVE muscle aches  Objective  Blood pressure 124/80, pulse 69, height 6\' 2"  (1.88 m), weight 192 lb (87.1 kg), SpO2 98 %.    General: No apparent distress alert and oriented x3 mood and affect normal, dressed appropriately.  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  Patient has tenderness to palpation laterally diffusely of the back.  Negative Corky Sox though.  Negative straight leg test.  Mild tightness more noted in the thoracolumbar juncture.   Impression and Recommendations:     The above documentation has been reviewed and is accurate and  complete Melvin Pulley, DO       Note: This dictation was prepared with Dragon dictation along with smaller phrase technology. Any transcriptional errors that result from this process are unintentional.

## 2019-11-19 NOTE — Assessment & Plan Note (Signed)
Patient has signs and symptoms consistent with more of a degenerative spinal stenosis.  We discussed with patient that with the scoliosis that I think is more chronic as well as what degenerative changes recently could have potentially worsen what he is feeling is the main concern which is the abnormality he sees in the near now.  Discussed with patient about potential further imaging including MRI of the back and CT abdomen and pelvis with patient having a history of prostate cancer.  Patient's x-rays did show the degenerative arthritic changes but no masses that were concerning no.  Patient wants to finish up with physical therapy and then will start working out on his own.  Patient is to increase activity and then at the gym and will follow up with me in 2 weeks afterwards.

## 2019-11-19 NOTE — Patient Instructions (Addendum)
Good to see you Continue PT  After PT get in gym but focus on machine weights No change in meds If worsening pain when I see you in a month we will do CT abdomen pelvis MRI back lumbar

## 2019-11-21 ENCOUNTER — Ambulatory Visit: Payer: BC Managed Care – PPO | Admitting: Physical Therapy

## 2019-11-21 ENCOUNTER — Other Ambulatory Visit: Payer: Self-pay

## 2019-11-21 ENCOUNTER — Encounter: Payer: Self-pay | Admitting: Physical Therapy

## 2019-11-21 DIAGNOSIS — G8929 Other chronic pain: Secondary | ICD-10-CM

## 2019-11-21 DIAGNOSIS — M545 Low back pain: Secondary | ICD-10-CM | POA: Diagnosis not present

## 2019-11-21 DIAGNOSIS — R293 Abnormal posture: Secondary | ICD-10-CM

## 2019-11-21 DIAGNOSIS — M62830 Muscle spasm of back: Secondary | ICD-10-CM

## 2019-11-21 NOTE — Therapy (Signed)
[facility] [address] [place], [place], [address] Phone: [phone]   Fax:  [phone]  Physical Therapy Treatment  Patient Details  Name: Melvin [name] MRN: [id number] Date of Birth: 1945/05/22 Referring Provider (PT): Hulan Saas, DO   Encounter Date: 11/21/2019   PT End of Session - 11/21/19 1406    Visit Number 9    Date for PT Re-Evaluation 12/24/19    Authorization Type BCBS    Authorization Time Period 11/08/19 to 12/24/19    PT Start Time 1400    PT Stop Time 1441    PT Time Calculation (min) 41 min    Activity Tolerance Patient tolerated treatment well;No increased pain    Behavior During Therapy WFL for tasks assessed/performed           Past Medical History:  Diagnosis Date  . Cancer [facility])     Past Surgical History:  Procedure Laterality Date  . LYMPHADENECTOMY Bilateral 09/12/2012   Procedure: LYMPHADENECTOMY;  Surgeon: Alexis Frock, MD;  [facility];  Service: Urology;  Laterality: Bilateral;  . ROBOT ASSISTED LAPAROSCOPIC RADICAL PROSTATECTOMY N/A 09/12/2012   Procedure: ROBOTIC ASSISTED LAPAROSCOPIC RADICAL PROSTATECTOMY;  Surgeon: Alexis Frock, MD;  [facility];  Service: Urology;  Laterality: N/A;    There were no vitals filed for this visit.   Subjective Assessment - 11/21/19 1404    Subjective Pt states things are going well. No pain at this time.    Diagnostic tests Xray: scoliosis degenerative changes    Currently in Pain? No/denies                             [facility] Adult PT Treatment/Exercise - 11/21/19 0001      Lumbar Exercises: Standing   Row Strengthening;Both;15 reps    Row Limitations blue TB standing on foam pad     Other Standing Lumbar Exercises BUE slide and thoracic extension facing wall x15 reps; partial tandem pallof press with yellow TB x10 reps each direction; thoracic rotation against wall x15 reps each side     Other Standing  Lumbar Exercises incline plank hand on counter x20 sec hold, x5 reps leg left; incline plank with hands on mat table x20 sec       Lumbar Exercises: Seated   Other Seated Lumbar Exercises lumbar lateral flexion stretch with UE walkout on green physioball x5 reps each direction      Lumbar Exercises: Prone   Straight Leg Raise 10 reps                  PT Education - 11/21/19 1440    Education Details updates to HEP reps    Person(s) Educated Patient    Methods Explanation    Comprehension Verbalized understanding            PT Short Term Goals - 10/31/19 1417      PT SHORT TERM GOAL #1   Title Pt will be independent and consistent with his initial HEP to improve posture awareness and decrease pain.    Time 3    Period Weeks    Status Achieved    Target Date 10/09/19             PT Long Term Goals - 10/31/19 1417      PT LONG TERM GOAL #1   Title Pt will have improved lumbar spine mobility evident by his ability to extend beyond  neutral in standing.    Time 6    Period Weeks    Status Achieved      PT LONG TERM GOAL #2   Title Pt will be able to walk 1 mile atleast 4 days a week without increase in Lt side low back pain.    Baseline 2-3 miles    Time 6    Period Weeks    Status Partially Met      PT LONG TERM GOAL #3   Title Pt will report atleast 60% improvement in his pain from the start of PT.    Baseline 50% improved    Time 6    Period Weeks    Status New      PT LONG TERM GOAL #4   Title Pt will have improved trunk strength evident by his ability to maintain a more upright posture throughout an entire session.    Time 6    Period Weeks    Status Partially Met                 Plan - 11/21/19 1441    Clinical Impression Statement Pt continues to make steady progress towards his functional goals. He was able to spend 10 minutes lifting/moving boxes earlier this week without immediate increase in low back pain. He noted adequate muscle  soreness during the next day bilaterally. Pt was able to complete all trunk strengthening exercises today without exacerbation of his pain. PT provided verbal/tactile cues during incline planks to avoid compensation of lumbar paraspinals. Will continue with current POC.    Personal Factors and Comorbidities Age;Time since onset of injury/illness/exacerbation;Fitness    Examination-Activity Limitations Stand;Locomotion Level    PT Treatment/Interventions ADLs/Self Care Home Management;Aquatic Therapy;Cryotherapy;Electrical Stimulation;Moist Heat;Functional mobility training;Neuromuscular re-education;Balance training;Therapeutic exercise;Therapeutic activities;Patient/family education;Manual techniques;Dry needling;Taping    PT Next Visit Plan standing therex progression; squatting/lifting; manual and possible d/n to Lt QL if agreeable to pt; progress trunk strength/flexibility-posture strengthening    PT Home Exercise Plan [facility]; pt completing series of stretches from MD           Patient will benefit from skilled therapeutic intervention in order to improve the following deficits and impairments:  Improper body mechanics, Pain, Postural dysfunction, Increased muscle spasms, Decreased mobility, Decreased activity tolerance, Decreased range of motion, Decreased strength, Hypomobility, Impaired flexibility, Decreased balance  Visit Diagnosis: Chronic left-sided low back pain without sciatica  Abnormal posture  Muscle spasm of back     Problem List Patient Active Problem List   Diagnosis Date Noted  . Degenerative lumbar spinal stenosis 08/03/2019  . 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 Low back pain 10/30/2017  . Posterior tibial tendon dysfunction (PTTD) of right lower extremity 02/24/2017    2:51 PM,11/21/19 [name] PT, DPT [place] [facility] [address] [place], [place], [address] Phone: [phone]   Fax:  [phone]  Name: Kiano Terrien MRN: [id number] Date of Birth: [birth year]

## 2019-12-05 ENCOUNTER — Ambulatory Visit: Payer: BC Managed Care – PPO | Attending: Family Medicine | Admitting: Physical Therapy

## 2019-12-05 ENCOUNTER — Encounter: Payer: Self-pay | Admitting: Physical Therapy

## 2019-12-05 ENCOUNTER — Other Ambulatory Visit: Payer: Self-pay

## 2019-12-05 DIAGNOSIS — M62830 Muscle spasm of back: Secondary | ICD-10-CM | POA: Diagnosis present

## 2019-12-05 DIAGNOSIS — G8929 Other chronic pain: Secondary | ICD-10-CM | POA: Insufficient documentation

## 2019-12-05 DIAGNOSIS — R293 Abnormal posture: Secondary | ICD-10-CM | POA: Insufficient documentation

## 2019-12-05 DIAGNOSIS — M545 Low back pain, unspecified: Secondary | ICD-10-CM

## 2019-12-05 NOTE — Therapy (Signed)
[facility] [address], [place], [place], [address] Phone: [phone]   Fax:  [phone]  Physical Therapy Treatment  Patient Details  Name: Melvin [name] MRN: [id number] Date of Birth: 1945-09-30 Referring Provider (PT): Hulan Saas, DO   Encounter Date: 12/05/2019   PT End of Session - 12/05/19 1259    Visit Number 10    Date for PT Re-Evaluation 12/24/19    Authorization Type BCBS    Authorization Time Period 11/08/19 to 12/24/19    PT Start Time 1230    PT Stop Time 1310    PT Time Calculation (min) 40 min    Activity Tolerance Patient tolerated treatment well;No increased pain    Behavior During Therapy WFL for tasks assessed/performed           Past Medical History:  Diagnosis Date  . Cancer [facility])     Past Surgical History:  Procedure Laterality Date  . LYMPHADENECTOMY Bilateral 09/12/2012   Procedure: LYMPHADENECTOMY;  Surgeon: Alexis Frock, MD;  [facility]: [facility];  Service: Urology;  Laterality: Bilateral;  . ROBOT ASSISTED LAPAROSCOPIC RADICAL PROSTATECTOMY N/A 09/12/2012   Procedure: ROBOTIC ASSISTED LAPAROSCOPIC RADICAL PROSTATECTOMY;  Surgeon: Alexis Frock, MD;  Location: [facility];  Service: Urology;  Laterality: N/A;    There were no vitals filed for this visit.   Subjective Assessment - 12/05/19 1232    Subjective Pt states that things are going well. His back is not bothering him right now but he was sore for about a day. He was able to go to the gym for the first time yesterday to walk on the treadmill. He is planning to go 3x/week.    Diagnostic tests Xray: scoliosis degenerative changes    Currently in Pain? No/denies                             [facility] Adult PT Treatment/Exercise - 12/05/19 0001      Lumbar Exercises: Aerobic   Nustep L3 x8 min PT present to discuss progress       Lumbar Exercises: Machines for Strengthening   Other Lumbar Machine Exercise lat  pulldown #20 x10 reps, #25 x10 reps     Other Lumbar Machine Exercise power tower rows: #25 2x10 reps   #15 trunk rotation with power through hips only x15 reps      Lumbar Exercises: Standing   Other Standing Lumbar Exercises hip extension yellow TB x12 reps, hip abduction yellow TB x10 reps     Other Standing Lumbar Exercises partial deadlift with tactile/verbal cuing x10 reps       Lumbar Exercises: Seated   Sit to Stand 10 reps    Sit to Stand Limitations holding #15 kettlebell, last 5 reps without sitting                   PT Education - 12/05/19 1400    Education Details technique with therex    Person(s) Educated Patient    Methods Explanation;Verbal cues;Tactile cues    Comprehension Verbalized understanding;Returned demonstration            PT Short Term Goals - 10/31/19 1417      PT SHORT TERM GOAL #1   Title Pt will be independent and consistent with his initial HEP to improve posture awareness and decrease pain.    Time 3    Period Weeks    Status Achieved    Target  Date 10/09/19             PT Long Term Goals - 10/31/19 1417      PT LONG TERM GOAL #1   Title Pt will have improved lumbar spine mobility evident by his ability to extend beyond neutral in standing.    Time 6    Period Weeks    Status Achieved      PT LONG TERM GOAL #2   Title Pt will be able to walk 1 mile atleast 4 days a week without increase in Lt side low back pain.    Baseline 2-3 miles    Time 6    Period Weeks    Status Partially Met      PT LONG TERM GOAL #3   Title Pt will report atleast 60% improvement in his pain from the start of PT.    Baseline 50% improved    Time 6    Period Weeks    Status New      PT LONG TERM GOAL #4   Title Pt will have improved trunk strength evident by his ability to maintain a more upright posture throughout an entire session.    Time 6    Period Weeks    Status Partially Met                 Plan - 12/05/19 1400     Clinical Impression Statement Pt arrived without low back pain. He has been able to return to the gym and plans to do this 3x/week moving forward. Today, PT reviewed several machines that the patient can use to promote trunk strength. Pt had LE fatigue with goblet squat but was able to complete everything without increase in back pain. Will continue with current POC moving forward.    Personal Factors and Comorbidities Age;Time since onset of injury/illness/exacerbation;Fitness    Examination-Activity Limitations Stand;Locomotion Level    PT Treatment/Interventions ADLs/Self Care Home Management;Aquatic Therapy;Cryotherapy;Electrical Stimulation;Moist Heat;Functional mobility training;Neuromuscular re-education;Balance training;Therapeutic exercise;Therapeutic activities;Patient/family education;Manual techniques;Dry needling;Taping    PT Next Visit Plan standing therex progression; squatting/lifting; manual and possible d/n to Lt QL if agreeable to pt; progress trunk strength/flexibility-posture strengthening    PT Home Exercise Plan [facility]; pt completing series of stretches from MD           Patient will benefit from skilled therapeutic intervention in order to improve the following deficits and impairments:  Improper body mechanics, Pain, Postural dysfunction, Increased muscle spasms, Decreased mobility, Decreased activity tolerance, Decreased range of motion, Decreased strength, Hypomobility, Impaired flexibility, Decreased balance  Visit Diagnosis: Chronic left-sided low back pain without sciatica  Abnormal posture  Muscle spasm of back     Problem List Patient Active Problem List   Diagnosis Date Noted  . Degenerative lumbar spinal stenosis 08/03/2019  . 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 Low back pain 10/30/2017  . Posterior tibial tendon dysfunction (PTTD) of right lower extremity 02/24/2017    2:08 PM,12/05/19 [name] PT, DPT [place] [facility]  [facility] [address], [place], [place], [address] Phone: [phone]   Fax:  [phone]  Name: Melvin [name] MRN: [id number] Date of Birth: 05/05/1945

## 2019-12-10 ENCOUNTER — Other Ambulatory Visit: Payer: Self-pay

## 2019-12-10 ENCOUNTER — Encounter: Payer: Self-pay | Admitting: Physical Therapy

## 2019-12-10 ENCOUNTER — Ambulatory Visit: Payer: BC Managed Care – PPO | Admitting: Physical Therapy

## 2019-12-10 DIAGNOSIS — R293 Abnormal posture: Secondary | ICD-10-CM

## 2019-12-10 DIAGNOSIS — M545 Low back pain: Secondary | ICD-10-CM | POA: Diagnosis not present

## 2019-12-10 DIAGNOSIS — M62830 Muscle spasm of back: Secondary | ICD-10-CM

## 2019-12-10 DIAGNOSIS — G8929 Other chronic pain: Secondary | ICD-10-CM

## 2019-12-10 NOTE — Patient Instructions (Signed)
Access Code: [email or web address] URL: [email or web address] Date: 12/10/2019 Prepared by: [facility]  Exercises .Supine 90/90 Alternating Toe Touch - 1 x daily - 7 x weekly - 3-4 sets - 5 reps .Supine Dead Bug with Leg Extension - 1 x daily - 7 x weekly - 3 sets - 5 reps  [facility] [address], [place], [place] [address] Phone # [phone] Fax [phone]

## 2019-12-10 NOTE — Therapy (Signed)
[facility] [address], [place], [place], [address] Phone: [phone]   Fax:  [phone]  Physical Therapy Treatment  Patient Details  Name: Melvin [name] MRN: [id number] Date of Birth: Aug 27, 1945 Referring Provider (PT): Hulan Saas, DO   Encounter Date: 12/10/2019   PT End of Session - 12/10/19 1033    Visit Number 11    Date for PT Re-Evaluation 12/24/19    Authorization Type BCBS    Authorization Time Period 11/08/19 to 12/24/19    PT Start Time 1011    PT Stop Time 1050    PT Time Calculation (min) 39 min    Activity Tolerance Patient tolerated treatment well;No increased pain    Behavior During Therapy WFL for tasks assessed/performed           Past Medical History:  Diagnosis Date  . Cancer [facility])     Past Surgical History:  Procedure Laterality Date  . LYMPHADENECTOMY Bilateral 09/12/2012   Procedure: LYMPHADENECTOMY;  Surgeon: Alexis Frock, MD;  Location: [facility];  Service: Urology;  Laterality: Bilateral;  . ROBOT ASSISTED LAPAROSCOPIC RADICAL PROSTATECTOMY N/A 09/12/2012   Procedure: ROBOTIC ASSISTED LAPAROSCOPIC RADICAL PROSTATECTOMY;  Surgeon: Alexis Frock, MD;  Location: [facility];  Service: Urology;  Laterality: N/A;    There were no vitals filed for this visit.   Subjective Assessment - 12/10/19 1030    Subjective Pt states that things are going well. He had no increase in back pain with walking a mile on the treadmill. He has no pain currently.    Diagnostic tests Xray: scoliosis degenerative changes    Currently in Pain? No/denies                    [facility] Adult PT Treatment/Exercise - 12/10/19 0001      Lumbar Exercises: Aerobic   Nustep L3 x6 min PT present to discuss gym progress       Lumbar Exercises: Supine   Dead Bug 5 reps    Dead Bug Limitations x2 sets, LE/UE reach    Other Supine Lumbar Exercises 90/90 alternating toe tap 3x5 reps     Other Supine Lumbar  Exercises knees 90/90 with upper trunk rotation holding yellow weighted ball 2x5 reps       Manual Therapy   Soft tissue mobilization STM Lt QL and Lt lumbar paraspinals-addaday tool     Myofascial Release trigger point release Lt QL                  PT Education - 12/10/19 1049    Education Details updated HEP    Person(s) Educated Patient    Methods Explanation;Handout    Comprehension Verbalized understanding            PT Short Term Goals - 10/31/19 1417      PT SHORT TERM GOAL #1   Title Pt will be independent and consistent with his initial HEP to improve posture awareness and decrease pain.    Time 3    Period Weeks    Status Achieved    Target Date 10/09/19             PT Long Term Goals - 10/31/19 1417      PT LONG TERM GOAL #1   Title Pt will have improved lumbar spine mobility evident by his ability to extend beyond neutral in standing.    Time 6    Period Weeks    Status Achieved  PT LONG TERM GOAL #2   Title Pt will be able to walk 1 mile atleast 4 days a week without increase in Lt side low back pain.    Baseline 2-3 miles    Time 6    Period Weeks    Status Partially Met      PT LONG TERM GOAL #3   Title Pt will report atleast 60% improvement in his pain from the start of PT.    Baseline 50% improved    Time 6    Period Weeks    Status New      PT LONG TERM GOAL #4   Title Pt will have improved trunk strength evident by his ability to maintain a more upright posture throughout an entire session.    Time 6    Period Weeks    Status Partially Met                 Plan - 12/10/19 1151    Clinical Impression Statement Pt continues to work on independence with a gym program. He was able to complete 1 mile of walking on the treadmill yesterday without difficulty. Session continued with focus on increasing abdominal strength and endurance. Pt was able to complete supine "dead bug" variations with up to 5 repetitions without  increase in low back pain, but he required intermittent PT tactile cuing to increase awareness of compensations. He had adequate muscle shaking and fatigue with all exercises. Ended with soft tissue mobilization to the Lt quadratus lumborum to decrease muscle tension in the area. Pt denied increase in pain end of session.    Personal Factors and Comorbidities Age;Time since onset of injury/illness/exacerbation;Fitness    Examination-Activity Limitations Stand;Locomotion Level    PT Treatment/Interventions ADLs/Self Care Home Management;Aquatic Therapy;Cryotherapy;Electrical Stimulation;Moist Heat;Functional mobility training;Neuromuscular re-education;Balance training;Therapeutic exercise;Therapeutic activities;Patient/family education;Manual techniques;Dry needling;Taping    PT Next Visit Plan standing therex progression; squatting/lifting; manual and possible d/n to Lt QL if agreeable to pt; progress trunk strength/flexibility-posture strengthening    PT Home Exercise Plan [facility]; pt completing series of stretches from MD           Patient will benefit from skilled therapeutic intervention in order to improve the following deficits and impairments:  Improper body mechanics, Pain, Postural dysfunction, Increased muscle spasms, Decreased mobility, Decreased activity tolerance, Decreased range of motion, Decreased strength, Hypomobility, Impaired flexibility, Decreased balance  Visit Diagnosis: Chronic left-sided low back pain without sciatica  Abnormal posture  Muscle spasm of back     Problem List Patient Active Problem List   Diagnosis Date Noted  . Degenerative lumbar spinal stenosis 08/03/2019  . 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 Low back pain 10/30/2017  . Posterior tibial tendon dysfunction (PTTD) of right lower extremity 02/24/2017    11:58 AM,12/10/19 [name] PT, DPT [place] [facility] [address] [place], [place], [address] Phone: [phone]   Fax:  [phone]  Name: Melvin [name] MRN: [id number] Date of Birth: 03-24-1946

## 2019-12-17 ENCOUNTER — Encounter: Payer: Self-pay | Admitting: Physical Therapy

## 2019-12-17 ENCOUNTER — Other Ambulatory Visit: Payer: Self-pay

## 2019-12-17 ENCOUNTER — Ambulatory Visit: Payer: BC Managed Care – PPO | Admitting: Physical Therapy

## 2019-12-17 DIAGNOSIS — R293 Abnormal posture: Secondary | ICD-10-CM

## 2019-12-17 DIAGNOSIS — M545 Low back pain, unspecified: Secondary | ICD-10-CM

## 2019-12-17 DIAGNOSIS — G8929 Other chronic pain: Secondary | ICD-10-CM

## 2019-12-17 DIAGNOSIS — M62830 Muscle spasm of back: Secondary | ICD-10-CM

## 2019-12-17 NOTE — Patient Instructions (Signed)
Access Code: [facility] URL: [email or web address] Date: 12/17/2019 Prepared by: [facility]  Exercises .Standing Shoulder Extension with Resistance - 1 x daily - 7 x weekly - 3 sets - 10 reps .Standing Anti-Rotation Press with Anchored Resistance - 1 x daily - 7 x weekly - 2 sets - 15-20 reps .Seated Thoracic Extension Arms Overhead - 2 x daily - 7 x weekly - 10 reps .Doorway Pec Stretch at 60 Elevation - 2 x daily - 7 x weekly - 3 reps - 30 seconds hold .Hip Flexor Stretch with Chair - 1 x daily - 7 x weekly - 3 reps - 20 seconds hold .Quadricep Stretch with Chair and Counter Support - 1 x daily - 7 x weekly - 3 reps - 220 seconds hold   [facility] [address] [place], [place] [address] Phone # [phone] Fax [phone]

## 2019-12-17 NOTE — Therapy (Signed)
[facility] [address] [place], [place], [address] Phone: [phone]   Fax:  [phone]  Physical Therapy Treatment  Patient Details  Name: Melvin [name] MRN: [id number] Date of Birth: 10-Jun-1945 Referring Provider (PT): Hulan Saas, DO   Encounter Date: 12/17/2019   PT End of Session - 12/17/19 1213    Visit Number 12    Date for PT Re-Evaluation 12/24/19    Authorization Type BCBS    Authorization Time Period 11/08/19 to 12/24/19    PT Start Time 3532    PT Stop Time 1228    PT Time Calculation (min) 43 min    Activity Tolerance Patient tolerated treatment well;No increased pain    Behavior During Therapy WFL for tasks assessed/performed           Past Medical History:  Diagnosis Date  . Cancer [facility])     Past Surgical History:  Procedure Laterality Date  . LYMPHADENECTOMY Bilateral 09/12/2012   Procedure: LYMPHADENECTOMY;  Surgeon: Alexis Frock, MD;  [facility]: [facility];  Service: Urology;  Laterality: Bilateral;  . ROBOT ASSISTED LAPAROSCOPIC RADICAL PROSTATECTOMY N/A 09/12/2012   Procedure: ROBOTIC ASSISTED LAPAROSCOPIC RADICAL PROSTATECTOMY;  Surgeon: Alexis Frock, MD;  [facility];  Service: Urology;  Laterality: N/A;    There were no vitals filed for this visit.   Subjective Assessment - 12/17/19 1156    Subjective Pt states that things are going well. He has no pain currently.    Diagnostic tests Xray: scoliosis degenerative changes    Currently in Pain? No/denies                             [facility] Adult PT Treatment/Exercise - 12/17/19 0001      Lumbar Exercises: Stretches   Hip Flexor Stretch Left;Right;30 seconds;3 reps    Hip Flexor Stretch Limitations on 2nd step     Quad Stretch 3 reps;Left;Right;20 seconds    Quad Stretch Limitations standing with LE in chair       Lumbar Exercises: Standing   Other Standing Lumbar Exercises pallof press red TB 2x20 reps,  blue TB x15 reps each direction       Manual Therapy   Soft tissue mobilization STM Lt QL and Lt lumbar paraspinals-addaday tool     Myofascial Release trigger point release Lt QL                  PT Education - 12/17/19 1213    Education Details updated HEP    Person(s) Educated Patient    Methods Explanation;Handout;Verbal cues    Comprehension Verbalized understanding;Returned demonstration            PT Short Term Goals - 10/31/19 1417      PT SHORT TERM GOAL #1   Title Pt will be independent and consistent with his initial HEP to improve posture awareness and decrease pain.    Time 3    Period Weeks    Status Achieved    Target Date 10/09/19             PT Long Term Goals - 10/31/19 1417      PT LONG TERM GOAL #1   Title Pt will have improved lumbar spine mobility evident by his ability to extend beyond neutral in standing.    Time 6    Period Weeks    Status Achieved      PT LONG TERM GOAL #  2   Title Pt will be able to walk 1 mile atleast 4 days a week without increase in Lt side low back pain.    Baseline 2-3 miles    Time 6    Period Weeks    Status Partially Met      PT LONG TERM GOAL #3   Title Pt will report atleast 60% improvement in his pain from the start of PT.    Baseline 50% improved    Time 6    Period Weeks    Status New      PT LONG TERM GOAL #4   Title Pt will have improved trunk strength evident by his ability to maintain a more upright posture throughout an entire session.    Time 6    Period Weeks    Status Partially Met                 Plan - 12/17/19 1354    Clinical Impression Statement Pt arrived without low back pain. Session focused on updating his HEP and progressing trunk strength. Pt has tightness of bilateral quads and hip flexors which contributes to his posturing throughout the day. PT completed soft tissue mobilization end of session. Pt had no increase in pain and is ready for discharge next visit  assuming no further problems arise.    Personal Factors and Comorbidities Age;Time since onset of injury/illness/exacerbation;Fitness    Examination-Activity Limitations Stand;Locomotion Level    PT Treatment/Interventions ADLs/Self Care Home Management;Aquatic Therapy;Cryotherapy;Electrical Stimulation;Moist Heat;Functional mobility training;Neuromuscular re-education;Balance training;Therapeutic exercise;Therapeutic activities;Patient/family education;Manual techniques;Dry needling;Taping    PT Next Visit Plan plan for d/c; standing therex progression; squatting/lifting; manual and possible d/n to Lt QL if agreeable to pt; progress trunk strength/flexibility-posture strengthening    PT Home Exercise Plan [facility]; pt completing series of stretches from MD           Patient will benefit from skilled therapeutic intervention in order to improve the following deficits and impairments:  Improper body mechanics, Pain, Postural dysfunction, Increased muscle spasms, Decreased mobility, Decreased activity tolerance, Decreased range of motion, Decreased strength, Hypomobility, Impaired flexibility, Decreased balance  Visit Diagnosis: Chronic left-sided low back pain without sciatica  Abnormal posture  Muscle spasm of back     Problem List Patient Active Problem List   Diagnosis Date Noted  . Degenerative lumbar spinal stenosis 08/03/2019  . 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 Low back pain 10/30/2017  . Posterior tibial tendon dysfunction (PTTD) of right lower extremity 02/24/2017    1:58 PM,12/17/19 [name] PT, DPT [place] [facility] [place] [facility] [address], [place], [place], [address] Phone: [phone]   Fax:  [phone]  Name: Luie Laneve MRN: [id number] Date of Birth: 11/18/1945

## 2019-12-24 ENCOUNTER — Encounter: Payer: BC Managed Care – PPO | Admitting: Physical Therapy

## 2020-01-02 ENCOUNTER — Encounter: Payer: Self-pay | Admitting: Physical Therapy

## 2020-01-02 ENCOUNTER — Other Ambulatory Visit: Payer: Self-pay

## 2020-01-02 ENCOUNTER — Ambulatory Visit: Payer: BC Managed Care – PPO | Attending: Family Medicine | Admitting: Physical Therapy

## 2020-01-02 DIAGNOSIS — G8929 Other chronic pain: Secondary | ICD-10-CM

## 2020-01-02 DIAGNOSIS — M62830 Muscle spasm of back: Secondary | ICD-10-CM | POA: Insufficient documentation

## 2020-01-02 DIAGNOSIS — R293 Abnormal posture: Secondary | ICD-10-CM | POA: Insufficient documentation

## 2020-01-02 DIAGNOSIS — M545 Low back pain: Secondary | ICD-10-CM | POA: Diagnosis not present

## 2020-01-02 NOTE — Therapy (Signed)
[facility] [address] [place], [place], [address] Phone: [phone]   Fax:  [phone]  Physical Therapy Treatment  Patient Details  Name: Melvin [name] MRN: [id number] Date of Birth: March 01, 1946 Referring Provider (PT): Hulan Saas, DO   Encounter Date: 01/02/2020   PT End of Session - 01/02/20 3244    Visit Number 13    Date for PT Re-Evaluation 12/24/19    Authorization Type BCBS    Authorization Time Period 01/02/20 to 01/14/20    PT Start Time 1446    PT Stop Time 1530    PT Time Calculation (min) 44 min    Activity Tolerance Patient tolerated treatment well;No increased pain    Behavior During Therapy WFL for tasks assessed/performed           Past Medical History:  Diagnosis Date  . Cancer [facility])     Past Surgical History:  Procedure Laterality Date  . LYMPHADENECTOMY Bilateral 09/12/2012   Procedure: LYMPHADENECTOMY;  Surgeon: Alexis Frock, MD;  [facility]: [facility];  Service: Urology;  Laterality: Bilateral;  . ROBOT ASSISTED LAPAROSCOPIC RADICAL PROSTATECTOMY N/A 09/12/2012   Procedure: ROBOTIC ASSISTED LAPAROSCOPIC RADICAL PROSTATECTOMY;  Surgeon: Alexis Frock, MD;  [facility]: [facility];  Service: Urology;  Laterality: N/A;    There were no vitals filed for this visit.   Subjective Assessment - 01/02/20 1457    Subjective Pt states that his pain fluctuates during the week. He has no pain currently. He would like 2 more visits of PT.    Diagnostic tests Xray: scoliosis degenerative changes    Currently in Pain? No/denies                             [facility] Adult PT Treatment/Exercise - 01/02/20 0001      Lumbar Exercises: Aerobic   Nustep L2 x10 min PT Present to discuss skilled need for PT      Lumbar Exercises: Machines for Strengthening   Other Lumbar Machine Exercise lat pulldown #25: x10, #35 x10 reps       Lumbar Exercises: Standing   Row Strengthening;Power tower;10  reps    Row Limitations #30, x2 sets     Other Standing Lumbar Exercises pallof press in partial tandem position: x15 reps each direction       Manual Therapy   Soft tissue mobilization STM Lt QL and Lt lumbar paraspinals-addaday tool     Myofascial Release trigger point release Lt QL                  PT Education - 01/02/20 1530    Education Details discussed pt ready for discharge and possible lack of insurance approval for last 2 appointments    Person(s) Educated Patient    Methods Explanation;Verbal cues    Comprehension Verbalized understanding            PT Short Term Goals - 10/31/19 1417      PT SHORT TERM GOAL #1   Title Pt will be independent and consistent with his initial HEP to improve posture awareness and decrease pain.    Time 3    Period Weeks    Status Achieved    Target Date 10/09/19             PT Long Term Goals - 10/31/19 1417      PT LONG TERM GOAL #1   Title Pt will have improved lumbar spine  mobility evident by his ability to extend beyond neutral in standing.    Time 6    Period Weeks    Status Achieved      PT LONG TERM GOAL #2   Title Pt will be able to walk 1 mile atleast 4 days a week without increase in Lt side low back pain.    Baseline 2-3 miles    Time 6    Period Weeks    Status Partially Met      PT LONG TERM GOAL #3   Title Pt will report atleast 60% improvement in his pain from the start of PT.    Baseline 50% improved    Time 6    Period Weeks    Status New      PT LONG TERM GOAL #4   Title Pt will have improved trunk strength evident by his ability to maintain a more upright posture throughout an entire session.    Time 6    Period Weeks    Status Partially Met                 Plan - 01/02/20 1532    Clinical Impression Statement Pt has made excellent progress towards his goals. He was unable to make his last scheduled appointment, but he arrives today having been working on his HEP and going to  the gym a couple of days. Although pt was ready for discharge, he is requesting to have 2 additional visits to ensure he has better understanding of his final program. PT educated him on machines that he can use at the gym. Pt reported feeling improved flexibility in the Lt side of his back following manual treatment. Will plan for discharge at his next appointment with final updates to his advanced HEP.    Personal Factors and Comorbidities Age;Time since onset of injury/illness/exacerbation;Fitness    Examination-Activity Limitations Stand;Locomotion Level    PT Treatment/Interventions ADLs/Self Care Home Management;Aquatic Therapy;Cryotherapy;Electrical Stimulation;Moist Heat;Functional mobility training;Neuromuscular re-education;Balance training;Therapeutic exercise;Therapeutic activities;Patient/family education;Manual techniques;Dry needling;Taping    PT Next Visit Plan plan for d/c; standing therex progression; squatting/lifting; manual and possible d/n to Lt QL if agreeable to pt; progress trunk strength/flexibility-posture strengthening    PT Home Exercise Plan [facility]; pt completing series of stretches from MD           Patient will benefit from skilled therapeutic intervention in order to improve the following deficits and impairments:  Improper body mechanics, Pain, Postural dysfunction, Increased muscle spasms, Decreased mobility, Decreased activity tolerance, Decreased range of motion, Decreased strength, Hypomobility, Impaired flexibility, Decreased balance  Visit Diagnosis: Chronic left-sided low back pain without sciatica  Abnormal posture  Muscle spasm of back     Problem List Patient Active Problem List   Diagnosis Date Noted  . Degenerative lumbar spinal stenosis 08/03/2019  . 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 Low back pain 10/30/2017  . Posterior tibial tendon dysfunction (PTTD) of right lower extremity 02/24/2017    4:37 PM,01/02/20 [name] PT, DPT [place] [facility] [facility] [address] [place], [place], [address] Phone: [phone]   Fax:  [phone]  Name: Melvin [name] MRN: [id number] Date of Birth: 24-Mar-1946

## 2020-01-08 ENCOUNTER — Ambulatory Visit: Payer: BC Managed Care – PPO | Admitting: Physical Therapy

## 2020-01-08 ENCOUNTER — Other Ambulatory Visit: Payer: Self-pay

## 2020-01-08 ENCOUNTER — Encounter: Payer: Self-pay | Admitting: Physical Therapy

## 2020-01-08 DIAGNOSIS — M62830 Muscle spasm of back: Secondary | ICD-10-CM

## 2020-01-08 DIAGNOSIS — M545 Low back pain, unspecified: Secondary | ICD-10-CM

## 2020-01-08 DIAGNOSIS — G8929 Other chronic pain: Secondary | ICD-10-CM

## 2020-01-08 DIAGNOSIS — R293 Abnormal posture: Secondary | ICD-10-CM

## 2020-01-08 NOTE — Therapy (Signed)
[facility] [address] [place], [place], [address] Phone: [phone]   Fax:  [phone]  Physical Therapy Treatment/Discharge  Patient Details  Name: Melvin [name] MRN: [id number] Date of Birth: December 20, 1945 Referring Provider (PT): Hulan Saas, DO   Encounter Date: 01/08/2020   PT End of Session - 01/08/20 1206    Visit Number 14    Date for PT Re-Evaluation 12/24/19    Authorization Type [employer]    Authorization Time Period 01/02/20 to 01/14/20    PT Start Time 1100    PT Stop Time 1144    PT Time Calculation (min) 44 min    Activity Tolerance Patient tolerated treatment well;No increased pain    Behavior During Therapy WFL for tasks assessed/performed           Past Medical History:  Diagnosis Date  . Cancer [facility])     Past Surgical History:  Procedure Laterality Date  . LYMPHADENECTOMY Bilateral 09/12/2012   Procedure: LYMPHADENECTOMY;  Surgeon: Alexis Frock, MD;  Location: [facility];  Service: Urology;  Laterality: Bilateral;  . ROBOT ASSISTED LAPAROSCOPIC RADICAL PROSTATECTOMY N/A 09/12/2012   Procedure: ROBOTIC ASSISTED LAPAROSCOPIC RADICAL PROSTATECTOMY;  Surgeon: Alexis Frock, MD;  Location: [facility];  Service: Urology;  Laterality: N/A;    There were no vitals filed for this visit.   Subjective Assessment - 01/08/20 1101    Subjective Pt states that things are going well. He did some moving the other day and felt some mild soreness the following day. Today is a good day. He is atleast 75% improved.    Diagnostic tests Xray: scoliosis degenerative changes    Currently in Pain? No/denies                             [facility] Adult PT Treatment/Exercise - 01/08/20 0001      Lumbar Exercises: Stretches   Hip Flexor Stretch 5 reps;20 seconds;Left;Right    Hip Flexor Stretch Limitations at steps with UE reach     Quad Stretch 2 reps;Left;Right;30 seconds    Quad Stretch Limitations  standing with foot in chair     Other Lumbar Stretch Exercise seated thoracic extension with UE reach overhead x10 reps     Other Lumbar Stretch Exercise pec stretch in doorway 5x10 sec hold - pt encouraged to hold longer than 10 sec      Lumbar Exercises: Aerobic   Nustep L3 x7 min PT present to review updates       Lumbar Exercises: Standing   Row Strengthening;Both;10 reps    Row Limitations black TB 2x10 reps- 2nd set with NBOS    Shoulder Extension Strengthening;Right;Left;10 reps    Theraband Level (Shoulder Extension) Level 3 (Green)    Shoulder Extension Limitations x2 sets, with hip flexion    Other Standing Lumbar Exercises partial tandem: pallof press with blue TB x15 reps each direction    Other Standing Lumbar Exercises hip extension with yellow TB around ankles x10 reps each                   PT Education - 01/08/20 1206    Education Details updates to HEP    Person(s) Educated Patient    Methods Explanation;Handout    Comprehension Returned demonstration;Verbalized understanding            PT Short Term Goals - 10/31/19 1417      PT SHORT TERM GOAL #1  Title Pt will be independent and consistent with his initial HEP to improve posture awareness and decrease pain.    Time 3    Period Weeks    Status Achieved    Target Date 10/09/19             PT Long Term Goals - 10/31/19 1417      PT LONG TERM GOAL #1   Title Pt will have improved lumbar spine mobility evident by his ability to extend beyond neutral in standing.    Time 6    Period Weeks    Status Achieved      PT LONG TERM GOAL #2   Title Pt will be able to walk 1 mile atleast 4 days a week without increase in Lt side low back pain.    Baseline 2-3 miles    Time 6    Period Weeks    Status Partially Met      PT LONG TERM GOAL #3   Title Pt will report atleast 60% improvement in his pain from the start of PT.    Baseline 50% improved    Time 6    Period Weeks    Status New       PT LONG TERM GOAL #4   Title Pt will have improved trunk strength evident by his ability to maintain a more upright posture throughout an entire session.    Time 6    Period Weeks    Status Partially Met                 Plan - 01/08/20 1209    Clinical Impression Statement Pt was discharged this visit having met his short and long term goals. Pt's back pain and general mobility is 75% improved. He recently started a membership at the gym and is going here a couple of days a month. He is walking and completing his HEP regularly and he denies issues with this. PT reviewed his HEP and educated pt on proper exercise progression/modifications for him to complete on his own. Pt is pleased with his progress, is agreeable with discharge and plans to work on this on his own.    Personal Factors and Comorbidities Age;Time since onset of injury/illness/exacerbation;Fitness    Examination-Activity Limitations Stand;Locomotion Level    PT Treatment/Interventions ADLs/Self Care Home Management;Aquatic Therapy;Cryotherapy;Electrical Stimulation;Moist Heat;Functional mobility training;Neuromuscular re-education;Balance training;Therapeutic exercise;Therapeutic activities;Patient/family education;Manual techniques;Dry needling;Taping    PT Next Visit Plan plan for d/c; standing therex progression; squatting/lifting; manual and possible d/n to Lt QL if agreeable to pt; progress trunk strength/flexibility-posture strengthening    PT Home Exercise Plan [facility]; pt completing series of stretches from MD           Patient will benefit from skilled therapeutic intervention in order to improve the following deficits and impairments:  Improper body mechanics, Pain, Postural dysfunction, Increased muscle spasms, Decreased mobility, Decreased activity tolerance, Decreased range of motion, Decreased strength, Hypomobility, Impaired flexibility, Decreased balance  Visit Diagnosis: Chronic left-sided low back pain  without sciatica  Abnormal posture  Muscle spasm of back     Problem List Patient Active Problem List   Diagnosis Date Noted  . Degenerative lumbar spinal stenosis 08/03/2019  . 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 Low back pain 10/30/2017  . Posterior tibial tendon dysfunction (PTTD) of right lower extremity 02/24/2017   PHYSICAL THERAPY DISCHARGE SUMMARY  Visits from Start of Care: 14  Current functional level related to goals / functional outcomes: See above for  more details    Remaining deficits: See above for more details    Education / Equipment: See above for more details   Plan: Patient agrees to discharge.  Patient goals were met. Patient is being discharged due to meeting the stated rehab goals.  ?????        12:10 PM,01/08/20 [place], DPT [place] [facility] [address], [place], [place], [address] Phone: [phone]   Fax:  [phone]  Name: Melvin [name] MRN: [id number] Date of Birth: [birth year]

## 2020-01-08 NOTE — Patient Instructions (Signed)
Access Code: [email or web address]: [email or web address]: 09/08/2021Prepared by: Chacra  Standing Shoulder Extension with Resistance - 1 x daily - 3 x weekly - 2 sets - 10 reps  Standing Bilateral Low Shoulder Row with Anchored Resistance - 1 x daily - 3 x weekly - 2 sets - 10 reps  Standing Anti-Rotation Press with Anchored Resistance - 1 x daily - 3 x weekly - 2 sets - 15 reps  Standing Hip Extension with Resistance at Ankles and Counter Support - 1 x daily - 3 x weekly - 1-2 sets - 10 reps  Seated Thoracic Extension Arms Overhead - 1 x daily - 7 x weekly - 10 reps  Doorway Pec Stretch at 60 Elevation - 1 x daily - 7 x weekly - 3 reps - 30 seconds hold  Hip Flexor Stretch with Chair - 1 x daily - 7 x weekly - 3 reps - 20 seconds hold  Quadricep Stretch with Chair and Counter Support - 1 x daily - 7 x weekly - 3 reps - 20 seconds hold   [facility] [address], [place], [place] [address] Phone # [phone] Fax [phone]

## 2020-01-14 ENCOUNTER — Ambulatory Visit: Payer: BC Managed Care – PPO | Admitting: Physical Therapy

## 2020-01-23 ENCOUNTER — Other Ambulatory Visit: Payer: Self-pay

## 2020-01-23 ENCOUNTER — Encounter: Payer: Self-pay | Admitting: Family Medicine

## 2020-01-23 ENCOUNTER — Ambulatory Visit (INDEPENDENT_AMBULATORY_CARE_PROVIDER_SITE_OTHER): Payer: BC Managed Care – PPO | Admitting: Family Medicine

## 2020-01-23 VITALS — BP 140/88 | HR 65 | Ht 74.0 in | Wt 190.0 lb

## 2020-01-23 DIAGNOSIS — G8929 Other chronic pain: Secondary | ICD-10-CM | POA: Diagnosis not present

## 2020-01-23 DIAGNOSIS — M48061 Spinal stenosis, lumbar region without neurogenic claudication: Secondary | ICD-10-CM

## 2020-01-23 DIAGNOSIS — M545 Low back pain: Secondary | ICD-10-CM

## 2020-01-23 NOTE — Patient Instructions (Addendum)
Good to see you PT for back 1 time a week 6-8 weeks Continue exercises  See me again in 6-8 weeks to discuss potential MRI

## 2020-01-23 NOTE — Assessment & Plan Note (Signed)
Patient has a degenerative lumbar spinal stenosis with significant scoliosis.  We discussed the possibility offor the advanced imaging.  Patient has not made a significant breakthrough but does notice that that the exercises are helpful.  Patient is hoping to continue to avoid advanced imaging and anything else at the moment.  No change in medications.  Follow-up again in 2 months

## 2020-01-23 NOTE — Progress Notes (Signed)
[place] [address] [place] Phone: [phone] Subjective:   I Melvin [name] am serving as a Education administrator for Dr. Hulan Saas.  This visit occurred during the SARS-CoV-2 public health emergency.  Safety protocols were in place, including screening questions prior to the visit, additional usage of staff PPE, and extensive cleaning of exam room while observing appropriate contact time as indicated for disinfecting solutions.   I'm seeing this patient by the request  of:  Orpah Melter, MD  CC: Low back pain  WCB:[id number]   11/19/2019 Patient has signs and symptoms consistent with more of a degenerative spinal stenosis.  We discussed with patient that with the scoliosis that I think is more chronic as well as what degenerative changes recently could have potentially worsen what he is feeling is the main concern which is the abnormality he sees in the near now.  Discussed with patient about potential further imaging including MRI of the back and CT abdomen and pelvis with patient having a history of prostate cancer.  Patient's x-rays did show the degenerative arthritic changes but no masses that were concerning no.  Patient wants to finish up with physical therapy and then will start working out on his own.  Patient is to increase activity and then at the gym and will follow up with me in 2 weeks afterwards.   Update 01/23/2020 Melvin [name] is a 74 y.o. male coming in with complaint of lumbar spinal stenosis. Patient states he is getting better but still experiencing pain. Going to PT. States he is no worse. Back is achy by the end of the day especially at night.  Patient has known severe degenerative disc disease with scoliosis.  Has had a history of spinal stenosis as well.  Has been doing physical therapy and is making some progress at the moment.     Past Medical History:  Diagnosis Date  . Cancer [facility])    Past Surgical History:    Procedure Laterality Date  . LYMPHADENECTOMY Bilateral 09/12/2012   Procedure: LYMPHADENECTOMY;  Surgeon: Alexis Frock, MD;  Location: [facility];  Service: Urology;  Laterality: Bilateral;  . ROBOT ASSISTED LAPAROSCOPIC RADICAL PROSTATECTOMY N/A 09/12/2012   Procedure: ROBOTIC ASSISTED LAPAROSCOPIC RADICAL PROSTATECTOMY;  Surgeon: Alexis Frock, MD;  Location: [facility];  Service: Urology;  Laterality: N/A;   Social History   Socioeconomic History  . Marital status: Divorced    Spouse name: Not on file  . Number of children: Not on file  . Years of education: Not on file  . Highest education level: Not on file  Occupational History  . Not on file  Tobacco Use  . Smoking status: Former Smoker    Quit date: 09/12/1992    Years since quitting: 27.3  . Smokeless tobacco: Never Used  Substance and Sexual Activity  . Alcohol use: Not on file  . Drug use: Not on file  . Sexual activity: Not on file  Other Topics Concern  . Not on file  Social History Narrative  . Not on file   Social Determinants of Health   Financial Resource Strain:   . Difficulty of Paying Living Expenses: Not on file  Food Insecurity:   . Worried About Charity fundraiser in the Last Year: Not on file  . Ran Out of Food in the Last Year: Not on file  Transportation Needs:   . Lack of Transportation (Medical): Not on file  . Lack of Transportation (Non-Medical):  Not on file  Physical Activity:   . Days of Exercise per Week: Not on file  . Minutes of Exercise per Session: Not on file  Stress:   . Feeling of Stress : Not on file  Social Connections:   . Frequency of Communication with Friends and Family: Not on file  . Frequency of Social Gatherings with Friends and Family: Not on file  . Attends Religious Services: Not on file  . Active Member of Clubs or Organizations: Not on file  . Attends Archivist Meetings: Not on file  . Marital Status: Not on file   Allergies  Allergen Reactions  .  Sulfa Antibiotics Hives   No family history on file.  Current Outpatient Medications (Endocrine & Metabolic):  .  predniSONE (DELTASONE) 20 MG tablet, Take 1 tablet (20 mg total) by mouth daily with breakfast. .  predniSONE (STERAPRED UNI-PAK 21 TAB) 10 MG (21) TBPK tablet, Take as directed   Current Outpatient Medications (Respiratory):  .  mometasone (NASONEX) 50 MCG/ACT nasal spray, Place 2 sprays into the nose daily as needed (for allergies).  Current Outpatient Medications (Analgesics):  .  diclofenac (VOLTAREN) 75 MG EC tablet, TAKE 1 TABLET BY MOUTH TWICE DAILY. Marland Kitchen  HYDROcodone-acetaminophen (NORCO) 5-325 MG per tablet, Take 1-2 tablets by mouth every 6 (six) hours as needed for pain.   Current Outpatient Medications (Other):  .  ciprofloxacin (CIPRO) 500 MG tablet, Take 1 tablet (500 mg total) by mouth 2 (two) times daily. Start day prior to office visit for foley removal .  diclofenac sodium (VOLTAREN) 1 % GEL, Apply 2 g topically 4 (four) times daily. .  DULoxetine (CYMBALTA) 20 MG capsule, Take 1 capsule (20 mg total) by mouth daily. .  fluocinonide cream (LIDEX) 4.33 %, Apply 1 application topically 2 (two) times daily as needed. For skin rash .  latanoprost (XALATAN) 0.005 % ophthalmic solution, Place 1 drop into both eyes at bedtime. Marland Kitchen  oxybutynin (DITROPAN) 5 MG tablet, Take 1 tablet (5 mg total) by mouth every 8 (eight) hours as needed. For bladder spasms while catheter in place .  ranitidine (ZANTAC) 150 MG tablet, Take 150 mg by mouth 2 (two) times daily. .  sennosides-docusate sodium (SENOKOT-S) 8.6-50 MG tablet, Take 1 tablet by mouth 2 (two) times daily. While taking pain meds to prevent constipation .  Vitamin D, Ergocalciferol, (DRISDOL) 1.25 MG (50000 UNIT) CAPS capsule, Take 1 capsule (50,000 Units total) by mouth every 7 (seven) days.   Reviewed prior external information including notes and imaging from  primary care provider As well as notes that were  available from care everywhere and other healthcare systems.  Past medical history, social, surgical and family history all reviewed in electronic medical record.  No pertanent information unless stated regarding to the chief complaint.   Review of Systems:  No headache, visual changes, nausea, vomiting, diarrhea, constipation, dizziness, abdominal pain, skin rash, fevers, chills, night sweats, weight loss, swollen lymph nodes, body aches, joint swelling, chest pain, shortness of breath, mood changes. POSITIVE muscle aches  Objective  Blood pressure 140/88, pulse 65, height 6\' 2"  (1.88 m), weight 190 lb (86.2 kg), SpO2 98 %.    General: No apparent distress alert and oriented x3 mood and affect normal, dressed appropriately.  HEENT: Pupils equal, extraocular movements intact  Gait antalgic with patient having overpronation of the hindfoot on the right is extremely MSK: Patient's low back has significant loss of lordosis with significant scoliosis noted with a  concave right of the lumbar spine.  Tender to palpation of paraspinal musculature on the left side of the back.  Tightness with FABER test.  No masses appreciated.  Limited range of motion in all planes but neurovascularly intact distally    Impression and Recommendations:     The above documentation has been reviewed and is accurate and complete Lyndal Pulley, DO       Note: This dictation was prepared with Dragon dictation along with smaller phrase technology. Any transcriptional errors that result from this process are unintentional.

## 2020-03-05 ENCOUNTER — Ambulatory Visit: Payer: BC Managed Care – PPO | Attending: Family Medicine | Admitting: Physical Therapy

## 2020-03-05 ENCOUNTER — Other Ambulatory Visit: Payer: Self-pay

## 2020-03-05 ENCOUNTER — Encounter: Payer: Self-pay | Admitting: Physical Therapy

## 2020-03-05 DIAGNOSIS — G8929 Other chronic pain: Secondary | ICD-10-CM | POA: Diagnosis present

## 2020-03-05 DIAGNOSIS — R293 Abnormal posture: Secondary | ICD-10-CM | POA: Diagnosis present

## 2020-03-05 DIAGNOSIS — M62830 Muscle spasm of back: Secondary | ICD-10-CM | POA: Insufficient documentation

## 2020-03-05 DIAGNOSIS — M545 Low back pain, unspecified: Secondary | ICD-10-CM | POA: Diagnosis not present

## 2020-03-05 NOTE — Therapy (Signed)
[facility] [address], [place], [place], [address] Phone: [phone]   Fax:  [phone]  Physical Therapy Evaluation  Patient Details  Name: Melvin [name] MRN: [id number] Date of Birth: 10-Sep-1945 Referring Provider (PT): Hulan Saas, DO   Encounter Date: 03/05/2020   PT End of Session - 03/05/20 1606    Visit Number 1    Date for PT Re-Evaluation 04/30/20    Authorization Type BCBS    Authorization Time Period 03/05/20-04/30/20    PT Start Time 0355    PT Stop Time 1058    PT Time Calculation (min) 43 min    Activity Tolerance Patient tolerated treatment well           Past Medical History:  Diagnosis Date  . Cancer [facility])     Past Surgical History:  Procedure Laterality Date  . LYMPHADENECTOMY Bilateral 09/12/2012   Procedure: LYMPHADENECTOMY;  Surgeon: Alexis Frock, MD;  Location: [facility];  Service: Urology;  Laterality: Bilateral;  . ROBOT ASSISTED LAPAROSCOPIC RADICAL PROSTATECTOMY N/A 09/12/2012   Procedure: ROBOTIC ASSISTED LAPAROSCOPIC RADICAL PROSTATECTOMY;  Surgeon: Alexis Frock, MD;  Location: [facility];  Service: Urology;  Laterality: N/A;    There were no vitals filed for this visit.    Subjective Assessment - 03/05/20 1016    Subjective I saw Dr. Tamala Julian a while ago and I have a couple of issues: I have scoliosis but not really changed and I have a sharp pain left low back.  It varies a lot.  I walk the dog and sometimes it bothers my back.  I do my HEP stretches and bands at home but not as often as I should.    Pertinent History haven't gone to the gym often b/c people not wearing mask at the gym: I do the treadmill 20-30 minutes pulley weight pull downs, stretches    Limitations Walking;Standing    How long can you sit comfortably? no problem but shifts sometimes but not painful    How long can you walk comfortably? <1 mile to walk the dog    Diagnostic tests Xray: scoliosis degenerative  changes    Patient Stated Goals Try to strengthen those muscles the doctor talked about;  I think I pull to the right.  I would like to walk more and not stoop over (walk straight);  work on posture    Currently in Pain? No/denies    Pain Score 0-No pain   when present 7-8   Pain Location Back    Pain Orientation Left;Right   left > right   Pain Type Chronic pain    Aggravating Factors  bending over; moving a chair, picking up dead branches in the yard    Pain Relieving Factors masssage gun at home helps              [facility] PT Assessment - 03/05/20 0001      Assessment   Medical Diagnosis Degenerative changes lumbar spine    Referring Provider (PT) Hulan Saas, DO    Next MD Visit next week     Prior Therapy 14 visits discharged Sept 2021      Precautions   Precautions None      Restrictions   Weight Bearing Restrictions No      Balance Screen   Has the patient fallen in the past 6 months No    Has the patient had a decrease in activity level because of a fear of falling?  No  Is the patient reluctant to leave their home because of a fear of falling?  No      Home Ecologist residence      Prior Function   Leisure walk the dog daily, read; go out to eat, go to the movies      Observation/Other Assessments   Observations pt standing with signficant  Rt lateral shift of the trunk    Focus on Therapeutic Outcomes (FOTO)  39% limitation       Posture/Postural Control   Posture Comments slouched sitting posture; excessive kyphosis in addition to right scoliosis       AROM   Lumbar Flexion 70   right scoliosis inc   Lumbar Extension 12    Lumbar - Right Side Bend WFLs    Lumbar - Left Side Bend 20   painful on left    Lumbar - Right Rotation 25% limited    Lumbar - Left Rotation 25% limited       Strength   Overall Strength Comments thoracic extensors and lower traps 3+/5    Right Hip Extension 4-/5    Right Hip ABduction 4-/5     Left Hip Extension 4-/5    Left Hip ABduction 4-/5    Lumbar Extension 3+/5      Flexibility   Quadriceps right > left signficant restriction in prone;  limited hip flexor lengths bil in sidelying       Palpation   Palpation comment palpable trigger point and tenderness Lt QL, lumbar paraspinals       Special Tests   Other special tests able to lie prone without discomfort                       Objective measurements completed on examination: See above findings.                 PT Short Term Goals - 03/05/20 1722      PT SHORT TERM GOAL #1   Title Pt will be independent and consistent with his initial HEP to improve posture awareness and decrease pain.    Time 4    Period Weeks    Status New    Target Date 04/02/20             PT Long Term Goals - 03/05/20 1722      PT LONG TERM GOAL #1   Title Pt will be independent with safe, self progressive HEP upright posture while standing/walking and improve/maintain spinal curvature    Time 8    Period Weeks    Status New    Target Date 04/30/20      PT LONG TERM GOAL #2   Title The patient will demonstrate good body mechanics with bending/stooping to pick up sticks in the yard    Time 8    Period Weeks    Status New    Target Date 04/30/20      PT LONG TERM GOAL #3   Title The patient will report a 50% improvement in low back pain with ADLS including walking his dog    Time 8    Period Weeks    Status New      PT LONG TERM GOAL #4   Title Pt will have improved trunk strength to 4-/5 to 4/5 evident by his ability to maintain a more upright posture    Time 8    Period Weeks  Status New      PT LONG TERM GOAL #5   Title FOTO functional outcome improved from 39% limitation to 33%    Time 8    Period Weeks    Status New                  Plan - 03/05/20 1608    Clinical Impression Statement The patient is referred to PT for treatment of chronic LBP related to his significant  right scoliosis which has worsened in the last couple of years.  He reports he often finds himself stooping over and has difficulty standing erect.  He generally has pain across his low back but he does have  sharper left sided back pain with palpation.  He reports his pain is worsened with bending over notably with picking up sticks in the yard.  The patient did complete 14 visits of PT in the summertime. The patient is lumbar ROM WFLs except limited and painful left sidebending.   Decreased quad, hip flexor and HS muscle lengths bilaterally.  Decreased left QL length.  Decreased thoracic and lumbar extensor strength grossly 3+/5 and decreased bil hip abduction and extensor strength.  Negative neural signs.  Low symptom irritability.  Patient is able to lie prone without increase in low back pain and decreased spinal curvature. He would benefit from PT with treatment focus on  postural and trunk extensor strengthening.    Personal Factors and Comorbidities Age;Time since onset of injury/illness/exacerbation;Fitness    Examination-Activity Limitations Stand;Locomotion Level    Examination-Participation Restrictions Tour manager    Stability/Clinical Decision Making Stable/Uncomplicated    Clinical Decision Making Low    Rehab Potential Good    PT Frequency 2x / week    PT Duration 8 weeks    PT Treatment/Interventions ADLs/Self Care Home Management;Aquatic Therapy;Cryotherapy;Electrical Stimulation;Moist Heat;Functional mobility training;Neuromuscular re-education;Balance training;Therapeutic exercise;Therapeutic activities;Patient/family education;Manual techniques;Dry needling;Taping    PT Next Visit Plan add prone quad stretching and standing hip flexor stretches to HEP;  try prone over 1-2 pillows with UE lifts/ head lifts; postural correction for lengthening of left side of back;  hip abduction and extensor strengthening    PT Home Exercise Plan [id number] from previous course of PT            Patient will benefit from skilled therapeutic intervention in order to improve the following deficits and impairments:  Improper body mechanics, Pain, Postural dysfunction, Increased muscle spasms, Decreased mobility, Decreased activity tolerance, Decreased range of motion, Decreased strength, Hypomobility, Impaired flexibility, Decreased balance  Visit Diagnosis: Chronic left-sided low back pain without sciatica - Plan: PT plan of care cert/re-cert  Abnormal posture - Plan: PT plan of care cert/re-cert  Muscle spasm of back - Plan: PT plan of care cert/re-cert     Problem List Patient Active Problem List   Diagnosis Date Noted  . Degenerative lumbar spinal stenosis 08/03/2019  . 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 Low back pain 10/30/2017  . Posterior tibial tendon dysfunction (PTTD) of right lower extremity 02/24/2017   [name], PT 03/05/20 5:29 PM Phone: [phone] Fax: [phone] [name] 03/05/2020, 5:28 PM  [facility] [address], [place], [place], [address] Phone: [phone]   Fax:  [phone]  Name: Melvin [name] MRN: [id number] Date of Birth: 10/07/1945

## 2020-03-10 NOTE — Progress Notes (Signed)
[facility] [address] [place] Phone: [phone] Subjective:   I Melvin [name] am serving as a Education administrator for Dr. Hulan Saas.  This visit occurred during the SARS-CoV-2 public health emergency.  Safety protocols were in place, including screening questions prior to the visit, additional usage of staff PPE, and extensive cleaning of exam room while observing appropriate contact time as indicated for disinfecting solutions.   I'm seeing this patient by the request  of:  Orpah Melter, MD  CC: Low back pain follow-up  FOY:[id number]   01/23/2020 Patient has a degenerative lumbar spinal stenosis with significant scoliosis.  We discussed the possibility offor the advanced imaging.  Patient has not made a significant breakthrough but does notice that that the exercises are helpful.  Patient is hoping to continue to avoid advanced imaging and anything else at the moment.  No change in medications.  Follow-up again in 2 months   Update 03/10/2020 Melvin [name] is a 74 y.o. male coming in with complaint of low back pain. Patient states he feels ok. Some days are worse than others. Going to PT. patient states that has helped a little bit.  No worsening of pain.  Still though is very aware of the pain.  Continuing to have discomfort on a daily basis.  And sometimes uncomfortable at night       Past Medical History:  Diagnosis Date  . Cancer [facility])    Past Surgical History:  Procedure Laterality Date  . LYMPHADENECTOMY Bilateral 09/12/2012   Procedure: LYMPHADENECTOMY;  Surgeon: Alexis Frock, MD;  Location: [facility];  Service: Urology;  Laterality: Bilateral;  . ROBOT ASSISTED LAPAROSCOPIC RADICAL PROSTATECTOMY N/A 09/12/2012   Procedure: ROBOTIC ASSISTED LAPAROSCOPIC RADICAL PROSTATECTOMY;  Surgeon: Alexis Frock, MD;  Location: [facility];  Service: Urology;  Laterality: N/A;   Social History   Socioeconomic History  . Marital status: Divorced      Spouse name: Not on file  . Number of children: Not on file  . Years of education: Not on file  . Highest education level: Not on file  Occupational History  . Not on file  Tobacco Use  . Smoking status: Former Smoker    Quit date: 09/12/1992    Years since quitting: 27.5  . Smokeless tobacco: Never Used  Substance and Sexual Activity  . Alcohol use: Not on file  . Drug use: Not on file  . Sexual activity: Not on file  Other Topics Concern  . Not on file  Social History Narrative  . Not on file   Social Determinants of Health   Financial Resource Strain:   . Difficulty of Paying Living Expenses: Not on file  Food Insecurity:   . Worried About Charity fundraiser in the Last Year: Not on file  . Ran Out of Food in the Last Year: Not on file  Transportation Needs:   . Lack of Transportation (Medical): Not on file  . Lack of Transportation (Non-Medical): Not on file  Physical Activity:   . Days of Exercise per Week: Not on file  . Minutes of Exercise per Session: Not on file  Stress:   . Feeling of Stress : Not on file  Social Connections:   . Frequency of Communication with Friends and Family: Not on file  . Frequency of Social Gatherings with Friends and Family: Not on file  . Attends Religious Services: Not on file  . Active Member of Clubs or Organizations: Not  on file  . Attends Archivist Meetings: Not on file  . Marital Status: Not on file   Allergies  Allergen Reactions  . Sulfa Antibiotics Hives   No family history on file.  Current Outpatient Medications (Endocrine & Metabolic):  .  predniSONE (DELTASONE) 20 MG tablet, Take 1 tablet (20 mg total) by mouth daily with breakfast. .  predniSONE (STERAPRED UNI-PAK 21 TAB) 10 MG (21) TBPK tablet, Take as directed   Current Outpatient Medications (Respiratory):  .  mometasone (NASONEX) 50 MCG/ACT nasal spray, Place 2 sprays into the nose daily as needed (for allergies).  Current Outpatient  Medications (Analgesics):  .  diclofenac (VOLTAREN) 75 MG EC tablet, TAKE 1 TABLET BY MOUTH TWICE DAILY. Marland Kitchen  HYDROcodone-acetaminophen (NORCO) 5-325 MG per tablet, Take 1-2 tablets by mouth every 6 (six) hours as needed for pain.   Current Outpatient Medications (Other):  .  ciprofloxacin (CIPRO) 500 MG tablet, Take 1 tablet (500 mg total) by mouth 2 (two) times daily. Start day prior to office visit for foley removal .  diclofenac sodium (VOLTAREN) 1 % GEL, Apply 2 g topically 4 (four) times daily. .  DULoxetine (CYMBALTA) 20 MG capsule, Take 1 capsule (20 mg total) by mouth daily. .  fluocinonide cream (LIDEX) 4.40 %, Apply 1 application topically 2 (two) times daily as needed. For skin rash .  latanoprost (XALATAN) 0.005 % ophthalmic solution, Place 1 drop into both eyes at bedtime. Marland Kitchen  oxybutynin (DITROPAN) 5 MG tablet, Take 1 tablet (5 mg total) by mouth every 8 (eight) hours as needed. For bladder spasms while catheter in place .  ranitidine (ZANTAC) 150 MG tablet, Take 150 mg by mouth 2 (two) times daily. .  sennosides-docusate sodium (SENOKOT-S) 8.6-50 MG tablet, Take 1 tablet by mouth 2 (two) times daily. While taking pain meds to prevent constipation .  Vitamin D, Ergocalciferol, (DRISDOL) 1.25 MG (50000 UNIT) CAPS capsule, Take 1 capsule (50,000 Units total) by mouth every 7 (seven) days.   Reviewed prior external information including notes and imaging from  primary care provider As well as notes that were available from care everywhere and other healthcare systems.  Past medical history, social, surgical and family history all reviewed in electronic medical record.  No pertanent information unless stated regarding to the chief complaint.   Review of Systems:  No headache, visual changes, nausea, vomiting, diarrhea, constipation, dizziness, abdominal pain, skin rash, fevers, chills, night sweats, weight loss, swollen lymph nodes, body aches, joint swelling, chest pain, shortness of  breath, mood changes. POSITIVE muscle aches  Objective  Blood pressure (!) 150/90, pulse 65, height 6\' 2"  (1.88 m), weight 190 lb (86.2 kg), SpO2 98 %.   General: No apparent distress alert and oriented x3 mood and affect normal, dressed appropriately.  HEENT: Pupils equal, extraocular movements intact  Respiratory: Patient's speak in full sentences and does not appear short of breath  Cardiovascular: No lower extremity edema, non tender, no erythema  Antalgic gait noted Back exam still shows significant scoliosis.  Patient is very comfortable sitting in his chair.  Neurovascularly intact distally.    Impression and Recommendations:     The above documentation has been reviewed and is accurate and complete Lyndal Pulley, DO

## 2020-03-11 ENCOUNTER — Other Ambulatory Visit: Payer: Self-pay

## 2020-03-11 ENCOUNTER — Encounter: Payer: Self-pay | Admitting: Family Medicine

## 2020-03-11 ENCOUNTER — Ambulatory Visit (INDEPENDENT_AMBULATORY_CARE_PROVIDER_SITE_OTHER): Payer: BC Managed Care – PPO | Admitting: Family Medicine

## 2020-03-11 VITALS — BP 150/90 | HR 65 | Ht 74.0 in | Wt 190.0 lb

## 2020-03-11 DIAGNOSIS — M48061 Spinal stenosis, lumbar region without neurogenic claudication: Secondary | ICD-10-CM

## 2020-03-11 DIAGNOSIS — M545 Low back pain, unspecified: Secondary | ICD-10-CM

## 2020-03-11 DIAGNOSIS — G8929 Other chronic pain: Secondary | ICD-10-CM | POA: Diagnosis not present

## 2020-03-11 NOTE — Patient Instructions (Addendum)
Good to see you MRI lumbar up to you Continue PT Continue medications Write me with questions I will try to find a name for you in [place] See me again in 2-3 months

## 2020-03-11 NOTE — Assessment & Plan Note (Signed)
Patient does have significant arthritic changes with scoliosis.  Patient is continuing to stay fairly well active.  Doing well with the physical therapy and is going to work with 2 different ones at this time.  Patient seems to be doing relatively well with the duloxetine and we can always increase.  We discussed with patient though having a history of prostate cancer as well if he was somewhat concerned we can consider the possibility of MRI.  Patient would like to have this ordered because he continues to fatigue even after about 10 minutes of walking.  Depending on findings he could be a candidate for epidural injections.  Patient will increase activity slowly.  Follow-up with me again after imaging to discuss further treatment options or if he decides to decline imaging none to check in in 3 months total time with patient as well as his wife today 35 minutes

## 2020-03-18 ENCOUNTER — Other Ambulatory Visit: Payer: Self-pay

## 2020-03-18 ENCOUNTER — Ambulatory Visit: Payer: BC Managed Care – PPO

## 2020-03-18 DIAGNOSIS — G8929 Other chronic pain: Secondary | ICD-10-CM

## 2020-03-18 DIAGNOSIS — M545 Low back pain, unspecified: Secondary | ICD-10-CM | POA: Diagnosis not present

## 2020-03-18 DIAGNOSIS — R293 Abnormal posture: Secondary | ICD-10-CM

## 2020-03-18 DIAGNOSIS — M62830 Muscle spasm of back: Secondary | ICD-10-CM

## 2020-03-18 NOTE — Therapy (Signed)
[facility] [address] [name], [place], [address] Phone: [phone]   Fax:  [phone]  Physical Therapy Treatment  Patient Details  Name: Melvin [name] MRN: [id number] Date of Birth: 08-06-1945 Referring Provider (PT): Hulan Saas, DO   Encounter Date: 03/18/2020   PT End of Session - 03/18/20 1020    Visit Number 2    Date for PT Re-Evaluation 04/30/20    Authorization Type BCBS    Authorization - Number of Visits 10    PT Start Time 0930    PT Stop Time 1013    PT Time Calculation (min) 43 min    Activity Tolerance Patient tolerated treatment well    Behavior During Therapy [facility] for tasks assessed/performed           Past Medical History:  Diagnosis Date  . Cancer [facility])     Past Surgical History:  Procedure Laterality Date  . LYMPHADENECTOMY Bilateral 09/12/2012   Procedure: LYMPHADENECTOMY;  Surgeon: Alexis Frock, MD;  [facility];  Service: Urology;  Laterality: Bilateral;  . ROBOT ASSISTED LAPAROSCOPIC RADICAL PROSTATECTOMY N/A 09/12/2012   Procedure: ROBOTIC ASSISTED LAPAROSCOPIC RADICAL PROSTATECTOMY;  Surgeon: Alexis Frock, MD;  [facility];  Service: Urology;  Laterality: N/A;    There were no vitals filed for this visit.   Subjective Assessment - 03/18/20 0934    Subjective I am having my house painted and I have moved things around and this has made my back sore.    Patient Stated Goals Try to strengthen those muscles the doctor talked about;  I think I pull to the right.  I would like to walk more and not stoop over (walk straight);  work on posture    Currently in Pain? Yes    Pain Score 4     Pain Location Back    Pain Orientation Right;Left   Lt>Rt   Pain Descriptors / Indicators Aching;Tightness    Pain Type Chronic pain    Pain Onset More than a month ago    Pain Frequency Intermittent    Aggravating Factors  activity, bending over    Pain Relieving Factors massage  gun, stretching                             [facility] Adult PT Treatment/Exercise - 03/18/20 0001      Lumbar Exercises: Stretches   Hip Flexor Stretch 5 reps;20 seconds;Left;Right      Lumbar Exercises: Aerobic   Nustep L2  x8  min PT present to review updates       Lumbar Exercises: Standing   Row Strengthening;Both;20 reps;Theraband    Theraband Level (Row) Level 4 (Blue)    Shoulder Extension Strengthening;Right;Left;20 reps;Theraband    Theraband Level (Shoulder Extension) Level 4 (Blue)      Lumbar Exercises: Seated   Other Seated Lumbar Exercises press into foam roll for core activation      Lumbar Exercises: Sidelying   Clam Both;20 reps    Clam Limitations tactile cues for alignment and verbal cues for core activation      Lumbar Exercises: Prone   Straight Leg Raise 10 reps    Straight Leg Raises Limitations 2x5      Manual Therapy   Manual Therapy Passive ROM    Manual therapy comments passive quad stretch in prone Rt and Lt  PT Short Term Goals - 03/05/20 1722      PT SHORT TERM GOAL #1   Title Pt will be independent and consistent with his initial HEP to improve posture awareness and decrease pain.    Time 4    Period Weeks    Status New    Target Date 04/02/20             PT Long Term Goals - 03/05/20 1722      PT LONG TERM GOAL #1   Title Pt will be independent with safe, self progressive HEP upright posture while standing/walking and improve/maintain spinal curvature    Time 8    Period Weeks    Status New    Target Date 04/30/20      PT LONG TERM GOAL #2   Title The patient will demonstrate good body mechanics with bending/stooping to pick up sticks in the yard    Time 8    Period Weeks    Status New    Target Date 04/30/20      PT LONG TERM GOAL #3   Title The patient will report a 50% improvement in low back pain with ADLS including walking his dog    Time 8    Period Weeks    Status New       PT LONG TERM GOAL #4   Title Pt will have improved trunk strength to 4-/5 to 4/5 evident by his ability to maintain a more upright posture    Time 8    Period Weeks    Status New      PT LONG TERM GOAL #5   Title FOTO functional outcome improved from 39% limitation to 33%    Time 8    Period Weeks    Status New                 Plan - 03/18/20 7619    Clinical Impression Statement Pt with first time follow-up after evaluation.  Session focused on review of HEP issued during last round of PT and cueing provided for alignment and core activation.  Pt with significant scoliosis that impacts functional posture.  Pt fatigues easily in postural stabilizers with activity in sitting and standing. Pt will continue to benefit from skilled PT to address core and hip strength and postural endurance to reduce pain and improve functional mobility.    PT Frequency 2x / week    PT Duration 8 weeks    PT Treatment/Interventions ADLs/Self Care Home Management;Aquatic Therapy;Cryotherapy;Electrical Stimulation;Moist Heat;Functional mobility training;Neuromuscular re-education;Balance training;Therapeutic exercise;Therapeutic activities;Patient/family education;Manual techniques;Dry needling;Taping    PT Next Visit Plan hip and core strength, postural strength    PT Home Exercise Plan [id number] from previous course of PT    Recommended Other Services initial cert is signed    Consulted and Agree with Plan of Care Patient           Patient will benefit from skilled therapeutic intervention in order to improve the following deficits and impairments:  Improper body mechanics, Pain, Postural dysfunction, Increased muscle spasms, Decreased mobility, Decreased activity tolerance, Decreased range of motion, Decreased strength, Hypomobility, Impaired flexibility, Decreased balance  Visit Diagnosis: Chronic left-sided low back pain without sciatica  Abnormal posture  Muscle spasm of  back     Problem List Patient Active Problem List   Diagnosis Date Noted  . Degenerative lumbar spinal stenosis 08/03/2019  . 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 Low back pain 10/30/2017  . Posterior tibial  tendon dysfunction (PTTD) of right lower extremity 02/24/2017    [name], PT 03/18/20 10:22 AM  [facility] [address] [place], [place], [address] Phone: [phone]   Fax:  [phone]  Name: Yeng Perz MRN: [id number] Date of Birth: 09-May-1945

## 2020-03-31 ENCOUNTER — Ambulatory Visit: Payer: BC Managed Care – PPO | Admitting: Physical Therapy

## 2020-03-31 ENCOUNTER — Other Ambulatory Visit: Payer: Self-pay

## 2020-03-31 DIAGNOSIS — M62830 Muscle spasm of back: Secondary | ICD-10-CM

## 2020-03-31 DIAGNOSIS — M545 Low back pain, unspecified: Secondary | ICD-10-CM

## 2020-03-31 DIAGNOSIS — G8929 Other chronic pain: Secondary | ICD-10-CM

## 2020-03-31 DIAGNOSIS — R293 Abnormal posture: Secondary | ICD-10-CM

## 2020-03-31 NOTE — Therapy (Signed)
[facility] [address], [place], [place], [address] Phone: [phone]   Fax:  [phone]  Physical Therapy Treatment  Patient Details  Name: Melvin [name] MRN: [id number] Date of Birth: 09-15-1945 Referring Provider (PT): Hulan Saas, DO   Encounter Date: 03/31/2020   PT End of Session - 03/31/20 1024    Visit Number 3    Date for PT Re-Evaluation 04/30/20    Authorization Type BCBS    Authorization Time Period 03/05/20-04/30/20    Authorization - Visit Number 2    Authorization - Number of Visits 10    PT Start Time 0935    PT Stop Time 1016    PT Time Calculation (min) 41 min    Activity Tolerance Patient tolerated treatment well           Past Medical History:  Diagnosis Date  . Cancer [facility])     Past Surgical History:  Procedure Laterality Date  . LYMPHADENECTOMY Bilateral 09/12/2012   Procedure: LYMPHADENECTOMY;  Surgeon: Alexis Frock, MD;  Location: [facility];  Service: Urology;  Laterality: Bilateral;  . ROBOT ASSISTED LAPAROSCOPIC RADICAL PROSTATECTOMY N/A 09/12/2012   Procedure: ROBOTIC ASSISTED LAPAROSCOPIC RADICAL PROSTATECTOMY;  Surgeon: Alexis Frock, MD;  Location: [facility];  Service: Urology;  Laterality: N/A;    There were no vitals filed for this visit.   Subjective Assessment - 03/31/20 0937    Subjective I have moments where I feel that spot left lower back and I do stretches.  I didn't do much over Thanksgiving    Pertinent History haven't gone to the gym often b/c people not wearing mask at the gym: I do the treadmill 20-30 minutes pulley weight pull downs, stretches    Currently in Pain? Yes    Pain Score 3     Pain Location Back    Pain Orientation Left                             [facility] Adult PT Treatment/Exercise - 03/31/20 0001      Lumbar Exercises: Stretches   Other Lumbar Stretch Exercise psoas doorway stretch with UEs      Lumbar Exercises: Standing    Other Standing Lumbar Exercises against wall: bil UE elevation, snow angels 8x each     Other Standing Lumbar Exercises against wall green band horizontal abduction 2x 10       Lumbar Exercises: Seated   Other Seated Lumbar Exercises back of the chair push down to engage transverse abdominals       Lumbar Exercises: Supine   Other Supine Lumbar Exercises LE and opp UE push down with exhale 5x each side       Lumbar Exercises: Sidelying   Clam Both;15 reps    Clam Limitations tactile cues for alignment and verbal cues for core activation      Lumbar Exercises: Prone   Other Prone Lumbar Exercises over 2 pillows hip extension with opp shoulder extension  8x each side       Manual Therapy   Manual therapy comments passive quad stretch in prone Rt and Lt     Joint Mobilization neutral gapping and pelvic distraction left grade 3 5x 20; hip mobs PA in neutral and in internal and external rotation     Soft tissue mobilization sidelying hip extension 10x right/left  PT Education - 03/31/20 1021    Education Details snow angels, shoulder horizontal abduction against the wall;  prone over 2 pillows hip extension    Person(s) Educated Patient    Methods Explanation;Demonstration;Handout    Comprehension Returned demonstration;Verbalized understanding            PT Short Term Goals - 03/05/20 1722      PT SHORT TERM GOAL #1   Title Pt will be independent and consistent with his initial HEP to improve posture awareness and decrease pain.    Time 4    Period Weeks    Status New    Target Date 04/02/20             PT Long Term Goals - 03/05/20 1722      PT LONG TERM GOAL #1   Title Pt will be independent with safe, self progressive HEP upright posture while standing/walking and improve/maintain spinal curvature    Time 8    Period Weeks    Status New    Target Date 04/30/20      PT LONG TERM GOAL #2   Title The patient will demonstrate good body  mechanics with bending/stooping to pick up sticks in the yard    Time 8    Period Weeks    Status New    Target Date 04/30/20      PT LONG TERM GOAL #3   Title The patient will report a 50% improvement in low back pain with ADLS including walking his dog    Time 8    Period Weeks    Status New      PT LONG TERM GOAL #4   Title Pt will have improved trunk strength to 4-/5 to 4/5 evident by his ability to maintain a more upright posture    Time 8    Period Weeks    Status New      PT LONG TERM GOAL #5   Title FOTO functional outcome improved from 39% limitation to 33%    Time 8    Period Weeks    Status New                 Plan - 03/31/20 1011    Clinical Impression Statement The patient is able to progress intensity of mobility and postural alignment exercises.  Verbal cues to hold head erect.  Patient finds the wall is also helpful for postural correction.  Decreased right hip extension > left.  Therapist monitoring response with all interventions.  Pain level remains low throughout treatment session.    Personal Factors and Comorbidities Age;Time since onset of injury/illness/exacerbation;Fitness    Examination-Activity Limitations Stand;Locomotion Level    Stability/Clinical Decision Making Stable/Uncomplicated    Rehab Potential Good    PT Frequency 2x / week    PT Duration 8 weeks    PT Treatment/Interventions ADLs/Self Care Home Management;Aquatic Therapy;Cryotherapy;Electrical Stimulation;Moist Heat;Functional mobility training;Neuromuscular re-education;Balance training;Therapeutic exercise;Therapeutic activities;Patient/family education;Manual techniques;Dry needling;Taping    PT Next Visit Plan check STGS next visit;  hip and core strength, postural strength;  hip extension mobility particularly on right    PT Home Exercise Plan [id number] from previous course of PT           Patient will benefit from skilled therapeutic intervention in order to improve the  following deficits and impairments:  Improper body mechanics, Pain, Postural dysfunction, Increased muscle spasms, Decreased mobility, Decreased activity tolerance, Decreased range of motion, Decreased strength, Hypomobility, Impaired flexibility, Decreased  balance  Visit Diagnosis: Chronic left-sided low back pain without sciatica  Abnormal posture  Muscle spasm of back     Problem List Patient Active Problem List   Diagnosis Date Noted  . Degenerative lumbar spinal stenosis 08/03/2019  . 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 Low back pain 10/30/2017  . Posterior tibial tendon dysfunction (PTTD) of right lower extremity 02/24/2017   [name], PT 03/31/20 10:32 AM Phone: [phone] Fax: [phone] [name] 03/31/2020, 10:32 AM  [facility] [address], [place], [place], [address] Phone: [phone]   Fax:  [phone]  Name: Lyall Faciane MRN: [id number] Date of Birth: [birth year]

## 2020-03-31 NOTE — Patient Instructions (Signed)
Access Code: [facility] URL: [email or web address] Date: 03/31/2020 Prepared by: [name]  Exercises Seated Thoracic Extension Arms Overhead - 1 x daily - 7 x weekly - 10 reps Doorway Pec Stretch at 60 Elevation - 1 x daily - 7 x weekly - 3 reps - 30 seconds hold Hip Flexor Stretch with Chair - 1 x daily - 7 x weekly - 3 reps - 20 seconds hold Quadricep Stretch with Chair and Counter Support - 1 x daily - 7 x weekly - 3 reps - 20 seconds hold Standing Shoulder Extension with Resistance - 1 x daily - 3 x weekly - 2 sets - 10 reps Standing Bilateral Low Shoulder Row with Anchored Resistance - 1 x daily - 3 x weekly - 2 sets - 10 reps Wall Angels - 1 x daily - 7 x weekly - 1 sets - 10 reps Standing Shoulder Horizontal Abduction with Resistance - 1 x daily - 7 x weekly - 1 sets - 10 reps Prone Hip Extension - Two Pillows - 1 x daily - 7 x weekly - 1 sets - 10 reps

## 2020-04-09 ENCOUNTER — Ambulatory Visit: Payer: BC Managed Care – PPO | Attending: Family Medicine

## 2020-04-09 ENCOUNTER — Other Ambulatory Visit: Payer: Self-pay

## 2020-04-09 DIAGNOSIS — M545 Low back pain, unspecified: Secondary | ICD-10-CM | POA: Diagnosis present

## 2020-04-09 DIAGNOSIS — R293 Abnormal posture: Secondary | ICD-10-CM | POA: Diagnosis not present

## 2020-04-09 DIAGNOSIS — G8929 Other chronic pain: Secondary | ICD-10-CM | POA: Diagnosis present

## 2020-04-09 DIAGNOSIS — M62830 Muscle spasm of back: Secondary | ICD-10-CM | POA: Diagnosis present

## 2020-04-09 NOTE — Therapy (Signed)
[facility] [address] [place], [place], [address] Phone: [phone]   Fax:  [phone]  Physical Therapy Treatment  Patient Details  Name: Melvin [name] MRN: [id number] Date of Birth: May 16, 1945 Referring Provider (PT): Hulan Saas, DO   Encounter Date: 04/09/2020   PT End of Session - 04/09/20 1313    Visit Number 4    Date for PT Re-Evaluation 04/30/20    Authorization Type BCBS    Authorization - Number of Visits 10    PT Start Time 2482    PT Stop Time 1313    PT Time Calculation (min) 38 min    Activity Tolerance Patient tolerated treatment well    Behavior During Therapy [facility] for tasks assessed/performed           Past Medical History:  Diagnosis Date  . Cancer [facility])     Past Surgical History:  Procedure Laterality Date  . LYMPHADENECTOMY Bilateral 09/12/2012   Procedure: LYMPHADENECTOMY;  Surgeon: Alexis Frock, MD;  [facility];  Service: Urology;  Laterality: Bilateral;  . ROBOT ASSISTED LAPAROSCOPIC RADICAL PROSTATECTOMY N/A 09/12/2012   Procedure: ROBOTIC ASSISTED LAPAROSCOPIC RADICAL PROSTATECTOMY;  Surgeon: Alexis Frock, MD;  [facility];  Service: Urology;  Laterality: N/A;    There were no vitals filed for this visit.   Subjective Assessment - 04/09/20 1304    Subjective I want to review some of my exercises.  I haven't had much pain.    Currently in Pain? No/denies                             [facility] Adult PT Treatment/Exercise - 04/09/20 0001      Lumbar Exercises: Aerobic   Nustep L2  x8  min PT present to review updates       Lumbar Exercises: Standing   Row Strengthening;Both;20 reps;Theraband    Theraband Level (Row) Level 4 (Blue)    Shoulder Extension Strengthening;Right;Left;20 reps;Theraband    Theraband Level (Shoulder Extension) Level 4 (Blue)    Other Standing Lumbar Exercises against wall: bil UE elevation, snow angels 8x each      Other Standing Lumbar Exercises against wall green band horizontal abduction 2x 10       Lumbar Exercises: Sidelying   Clam Both;20 reps    Clam Limitations tactile cues for alignment and verbal cues for core activation      Lumbar Exercises: Prone   Straight Leg Raise 10 reps    Other Prone Lumbar Exercises over 2 pillows hip extension with opp shoulder extension  8x each side                     PT Short Term Goals - 04/09/20 1309      PT SHORT TERM GOAL #1   Title Pt will be independent and consistent with his initial HEP to improve posture awareness and decrease pain.    Status Achieved      PT SHORT TERM GOAL #2   Title --      PT SHORT TERM GOAL #3   Title --             PT Long Term Goals - 04/09/20 1310      PT LONG TERM GOAL #2   Title The patient will demonstrate good body mechanics with bending/stooping to pick up sticks in the yard    Baseline working on Theatre manager  Status On-going      PT LONG TERM GOAL #3   Title The patient will report a 50% improvement in low back pain with ADLS including walking his dog    Baseline 40%    Status On-going                 Plan - 04/09/20 1309    Clinical Impression Statement Pt is significantly challenged with postural alignment and endurance with standing exercises against the wall.  Pt's alignment did improve after a few reps and verbal/tactile cues by PT.  Verbal cues to hold head erect.  Pt tolerated all exercises in the clinic well and will continue at home for postural and lumbopelvic strength to address postural abnormality related to spinal alignment.  Pt will continue to benefit from skilled PT to address strength, postural alignment and pain.    Rehab Potential Good    PT Frequency 2x / week    PT Duration 8 weeks    PT Treatment/Interventions ADLs/Self Care Home Management;Aquatic Therapy;Cryotherapy;Electrical Stimulation;Moist Heat;Functional mobility training;Neuromuscular  re-education;Balance training;Therapeutic exercise;Therapeutic activities;Patient/family education;Manual techniques;Dry needling;Taping           Patient will benefit from skilled therapeutic intervention in order to improve the following deficits and impairments:  Improper body mechanics,Pain,Postural dysfunction,Increased muscle spasms,Decreased mobility,Decreased activity tolerance,Decreased range of motion,Decreased strength,Hypomobility,Impaired flexibility,Decreased balance  Visit Diagnosis: Abnormal posture  Chronic left-sided low back pain without sciatica  Muscle spasm of back     Problem List Patient Active Problem List   Diagnosis Date Noted  . Degenerative lumbar spinal stenosis 08/03/2019  . 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 Low back pain 10/30/2017  . Posterior tibial tendon dysfunction (PTTD) of right lower extremity 02/24/2017    Melvin [name], PT 04/09/20 1:14 PM  [facility] [address] [place], [place], [address] Phone: [phone]   Fax:  [phone]  Name: Melvin [name] MRN: [id number] Date of Birth: [birth year]

## 2020-04-15 ENCOUNTER — Other Ambulatory Visit: Payer: Self-pay

## 2020-04-15 ENCOUNTER — Ambulatory Visit: Payer: BC Managed Care – PPO

## 2020-04-15 DIAGNOSIS — M62830 Muscle spasm of back: Secondary | ICD-10-CM

## 2020-04-15 DIAGNOSIS — R293 Abnormal posture: Secondary | ICD-10-CM | POA: Diagnosis not present

## 2020-04-15 DIAGNOSIS — M545 Low back pain, unspecified: Secondary | ICD-10-CM

## 2020-04-15 DIAGNOSIS — G8929 Other chronic pain: Secondary | ICD-10-CM

## 2020-04-15 NOTE — Therapy (Signed)
[facility] [address], [place], [place], [address] Phone: [phone]   Fax:  [phone]  Physical Therapy Treatment  Patient Details  Name: Melvin [name] MRN: [id number] Date of Birth: Nov 09, 1945 Referring Provider (PT): Hulan Saas, DO   Encounter Date: 04/15/2020   PT End of Session - 04/15/20 1531    Visit Number 5    Date for PT Re-Evaluation 04/30/20    PT Start Time 1443    PT Stop Time 1529    PT Time Calculation (min) 46 min    Activity Tolerance Patient tolerated treatment well    Behavior During Therapy [facility] for tasks assessed/performed           Past Medical History:  Diagnosis Date   Cancer [facility])     Past Surgical History:  Procedure Laterality Date   LYMPHADENECTOMY Bilateral 09/12/2012   Procedure: LYMPHADENECTOMY;  Surgeon: Alexis Frock, MD;  [facility];  Service: Urology;  Laterality: Bilateral;   ROBOT ASSISTED LAPAROSCOPIC RADICAL PROSTATECTOMY N/A 09/12/2012   Procedure: ROBOTIC ASSISTED LAPAROSCOPIC RADICAL PROSTATECTOMY;  Surgeon: Alexis Frock, MD;  [facility]: [facility];  Service: Urology;  Laterality: N/A;    There were no vitals filed for this visit.   Subjective Assessment - 04/15/20 1443    Subjective I feel 40% better overall.  I am using stretching exercises as needed to manage    Currently in Pain? No/denies                             [facility] Adult PT Treatment/Exercise - 04/15/20 0001      Exercises   Exercises Lumbar      Lumbar Exercises: Stretches   Single Knee to Chest Stretch 5 reps;20 seconds    Lower Trunk Rotation 3 reps;20 seconds    Hip Flexor Stretch 5 reps;20 seconds;Left;Right      Lumbar Exercises: Aerobic   Nustep L2  x 10  min PT present to review updates      Lumbar Exercises: Standing   Row Strengthening;Both;20 reps    Row Limitations 20#    Shoulder Extension Power Tower;Strengthening;20 reps    Shoulder Extension  Limitations 15#    Other Standing Lumbar Exercises walking in reverse: 20# x10      Lumbar Exercises: Seated   Other Seated Lumbar Exercises ball roll outs: forward and bil diagonals x 5 each      Lumbar Exercises: Supine   Bridge 20 reps                    PT Short Term Goals - 04/09/20 1309      PT SHORT TERM GOAL #1   Title Pt will be independent and consistent with his initial HEP to improve posture awareness and decrease pain.    Status Achieved      PT SHORT TERM GOAL #2   Title --      PT SHORT TERM GOAL #3   Title --             PT Long Term Goals - 04/15/20 1448      PT LONG TERM GOAL #1   Title Pt will be independent with safe, self progressive HEP upright posture while standing/walking and improve/maintain spinal curvature    Time 8    Period Weeks    Status On-going      PT LONG TERM GOAL #3   Title The  patient will report a 50% improvement in low back pain with ADLS including walking his dog    Baseline 40%    Time 8    Period Weeks    Status On-going      PT LONG TERM GOAL #5   Baseline --                 Plan - 04/15/20 1454    Clinical Impression Statement Pt reports 40% reduction in overall frequency and intensity of pain.  Pain has increased for very brief periods to 7/10 in the Lt lumbar spine. Pt is able to stretch and relieve the pain.  Pt continues to work on Economist and postural alignment. Pt provided verbal and tactile cues for alignment with exercise in the clinic.  Pt denies any pain with activity in the clinic today.  Pt will continue to benefit from skilled PT to address LBP, postural alignment, and core and hip strength.    PT Frequency 2x / week    PT Duration 8 weeks    PT Treatment/Interventions ADLs/Self Care Home Management;Aquatic Therapy;Cryotherapy;Electrical Stimulation;Moist Heat;Functional mobility training;Neuromuscular re-education;Balance training;Therapeutic exercise;Therapeutic  activities;Patient/family education;Manual techniques;Dry needling;Taping    PT Next Visit Plan work on postural strength, lumbar flexibility, hip extension.  ERO needed 04/17/20.  Pt will see PTA on 12/20 and then be out of town.  He would like to continue in January and will need recert before then.    PT Home Exercise Plan [facility] from previous course of PT    Consulted and Agree with Plan of Care Patient           Patient will benefit from skilled therapeutic intervention in order to improve the following deficits and impairments:  Improper body mechanics,Pain,Postural dysfunction,Increased muscle spasms,Decreased mobility,Decreased activity tolerance,Decreased range of motion,Decreased strength,Hypomobility,Impaired flexibility,Decreased balance  Visit Diagnosis: Abnormal posture  Chronic left-sided low back pain without sciatica  Muscle spasm of back     Problem List Patient Active Problem List   Diagnosis Date Noted   Degenerative lumbar spinal stenosis 08/03/2019   Ankle arthritis 05/31/2018   Low back pain 10/30/2017   Posterior tibial tendon dysfunction (PTTD) of right lower extremity 02/24/2017    Melvin [name], PT 04/15/20 3:32 PM  [facility] [address] [place], [place], [address] Phone: [phone]   Fax:  [phone]  Name: Melvin [name] MRN: [id number] Date of Birth: 1945-12-01

## 2020-04-17 ENCOUNTER — Ambulatory Visit: Payer: BC Managed Care – PPO | Admitting: Physical Therapy

## 2020-04-17 ENCOUNTER — Other Ambulatory Visit: Payer: Self-pay

## 2020-04-17 DIAGNOSIS — M62830 Muscle spasm of back: Secondary | ICD-10-CM

## 2020-04-17 DIAGNOSIS — G8929 Other chronic pain: Secondary | ICD-10-CM

## 2020-04-17 DIAGNOSIS — R293 Abnormal posture: Secondary | ICD-10-CM | POA: Diagnosis not present

## 2020-04-17 NOTE — Therapy (Signed)
[facility] [address], [place], [place], [address] Phone: [phone]   Fax:  [phone]  Physical Therapy Treatment  Patient Details  Name: Melvin [name] MRN: [id number] Date of Birth: Sep 29, 1945 Referring Provider (PT): Hulan Saas, DO   Encounter Date: 04/17/2020   PT End of Session - 04/17/20 1201    Visit Number 6    Date for PT Re-Evaluation 04/30/20    Authorization Type BCBS    Authorization Time Period 03/05/20-04/30/20    Authorization - Number of Visits 10    PT Start Time 1015    PT Stop Time 1100    PT Time Calculation (min) 45 min    Activity Tolerance Patient tolerated treatment well           Past Medical History:  Diagnosis Date  . Cancer [facility])     Past Surgical History:  Procedure Laterality Date  . LYMPHADENECTOMY Bilateral 09/12/2012   Procedure: LYMPHADENECTOMY;  Surgeon: Alexis Frock, MD;  Location: [facility];  Service: Urology;  Laterality: Bilateral;  . ROBOT ASSISTED LAPAROSCOPIC RADICAL PROSTATECTOMY N/A 09/12/2012   Procedure: ROBOTIC ASSISTED LAPAROSCOPIC RADICAL PROSTATECTOMY;  Surgeon: Alexis Frock, MD;  Location: [facility];  Service: Urology;  Laterality: N/A;    There were no vitals filed for this visit.   Subjective Assessment - 04/17/20 1016    Subjective Getting ready to travel.  Needs to cancel next appt.  Sometimes I'm sore the next day after PT.    Pertinent History haven't gone to the gym often b/c people not wearing mask at the gym: I do the treadmill 20-30 minutes pulley weight pull downs, stretches    Diagnostic tests Xray: scoliosis degenerative changes    Patient Stated Goals Try to strengthen those muscles the doctor talked about;  I think I pull to the right.  I would like to walk more and not stoop over (walk straight);  work on posture    Currently in Pain? Yes    Pain Score 5     Pain Location Back                             [facility]  Adult PT Treatment/Exercise - 04/17/20 0001      Lumbar Exercises: Stretches   Other Lumbar Stretch Exercise psoas doorway stretch with UEs      Lumbar Exercises: Aerobic   Nustep L2  x 10  min PT present to review updates      Lumbar Exercises: Standing   Row Strengthening;Both;20 reps;Theraband    Theraband Level (Row) Level 4 (Blue)    Row Limitations low and high    Shoulder Extension Strengthening;Right;Left;20 reps;Theraband    Theraband Level (Shoulder Extension) Level 4 (Blue)    Other Standing Lumbar Exercises against wall: bil UE elevation 10x    Other Standing Lumbar Exercises wall push ups 10x      Lumbar Exercises: Prone   Straight Leg Raise 5 reps    Straight Leg Raises Limitations over 2 pillows    Other Prone Lumbar Exercises over 2 pillows      Shoulder Exercises: Standing   Other Standing Exercises bil UE wall slides 10x                  PT Education - 04/17/20 1059    Education Details prone shoulder horizontal abduction; bil UE slides on wall    Person(s) Educated Patient  Methods Explanation;Demonstration;Handout    Comprehension Returned demonstration;Verbalized understanding            PT Short Term Goals - 04/09/20 1309      PT SHORT TERM GOAL #1   Title Pt will be independent and consistent with his initial HEP to improve posture awareness and decrease pain.    Status Achieved      PT SHORT TERM GOAL #2   Title --      PT SHORT TERM GOAL #3   Title --             PT Long Term Goals - 04/15/20 1448      PT LONG TERM GOAL #1   Title Pt will be independent with safe, self progressive HEP upright posture while standing/walking and improve/maintain spinal curvature    Time 8    Period Weeks    Status On-going      PT LONG TERM GOAL #3   Title The patient will report a 50% improvement in low back pain with ADLS including walking his dog    Baseline 40%    Time 8    Period Weeks    Status On-going      PT LONG TERM GOAL  #5   Baseline --                 Plan - 04/17/20 1052    Clinical Impression Statement The patient has difficulty lifting his head in the prone position despite 2 pillows to accomodate for curvature.  Improving overall muscle endurance with standing postural correction for short periods of time however with fatigue and when sitting to rest, his curvature increases significantly.  Verbal and tactile cues for musculature activation of extensor muscles.    Personal Factors and Comorbidities Age;Time since onset of injury/illness/exacerbation;Fitness    Examination-Activity Limitations Stand;Locomotion Level    Rehab Potential Good    PT Frequency 2x / week    PT Duration 8 weeks    PT Treatment/Interventions ADLs/Self Care Home Management;Aquatic Therapy;Cryotherapy;Electrical Stimulation;Moist Heat;Functional mobility training;Neuromuscular re-education;Balance training;Therapeutic exercise;Therapeutic activities;Patient/family education;Manual techniques;Dry needling;Taping    PT Next Visit Plan ERO when patient returns from [place];  continue with trunk extensor strengthening;  FOTO;  check progress toward goals    PT Home Exercise Plan [facility] from previous course of PT           Patient will benefit from skilled therapeutic intervention in order to improve the following deficits and impairments:  Improper body mechanics,Pain,Postural dysfunction,Increased muscle spasms,Decreased mobility,Decreased activity tolerance,Decreased range of motion,Decreased strength,Hypomobility,Impaired flexibility,Decreased balance  Visit Diagnosis: Abnormal posture  Chronic left-sided low back pain without sciatica  Muscle spasm of back     Problem List Patient Active Problem List   Diagnosis Date Noted  . Degenerative lumbar spinal stenosis 08/03/2019  . 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 Low back pain 10/30/2017  . Posterior tibial tendon dysfunction (PTTD) of right lower extremity 02/24/2017    [name], PT 04/17/20 12:08 PM Phone: [phone] Fax: [phone] [name] 04/17/2020, 12:08 PM  [facility] [address] [place], [place], [address] Phone: [phone]   Fax:  [phone]  Name: Melvin [name] MRN: [id number] Date of Birth: 06-Jul-1945

## 2020-04-17 NOTE — Patient Instructions (Signed)
Access Code: [facility] URL: [email or web address] Date: 04/17/2020 Prepared by: [name]  Exercises Seated Thoracic Extension Arms Overhead - 1 x daily - 7 x weekly - 10 reps Doorway Pec Stretch at 60 Elevation - 1 x daily - 7 x weekly - 3 reps - 30 seconds hold Hip Flexor Stretch with Chair - 1 x daily - 7 x weekly - 3 reps - 20 seconds hold Quadricep Stretch with Chair and Counter Support - 1 x daily - 7 x weekly - 3 reps - 20 seconds hold Standing Shoulder Extension with Resistance - 1 x daily - 3 x weekly - 2 sets - 10 reps Standing Bilateral Low Shoulder Row with Anchored Resistance - 1 x daily - 3 x weekly - 2 sets - 10 reps Wall Angels - 1 x daily - 7 x weekly - 1 sets - 10 reps Standing Shoulder Horizontal Abduction with Resistance - 1 x daily - 7 x weekly - 1 sets - 10 reps Prone Hip Extension - Two Pillows - 1 x daily - 7 x weekly - 1 sets - 10 reps Prone Shoulder Horizontal Abduction - 1 x daily - 7 x weekly - 1 sets - 10 reps Standing shoulder flexion wall slides - 1 x daily - 7 x weekly - 1 sets - 10 reps

## 2020-04-20 ENCOUNTER — Encounter: Payer: BC Managed Care – PPO | Admitting: Physical Therapy

## 2020-05-12 ENCOUNTER — Ambulatory Visit: Payer: BC Managed Care – PPO | Admitting: Physical Therapy

## 2020-05-20 ENCOUNTER — Ambulatory Visit: Payer: BC Managed Care – PPO | Attending: Family Medicine

## 2020-05-20 ENCOUNTER — Other Ambulatory Visit: Payer: Self-pay

## 2020-05-20 DIAGNOSIS — R293 Abnormal posture: Secondary | ICD-10-CM

## 2020-05-20 DIAGNOSIS — M545 Low back pain, unspecified: Secondary | ICD-10-CM | POA: Insufficient documentation

## 2020-05-20 DIAGNOSIS — G8929 Other chronic pain: Secondary | ICD-10-CM | POA: Diagnosis present

## 2020-05-20 DIAGNOSIS — M62830 Muscle spasm of back: Secondary | ICD-10-CM | POA: Diagnosis present

## 2020-05-20 NOTE — Therapy (Signed)
[facility] [address] [place], [place], [address] Phone: [phone]   Fax:  [phone]  Physical Therapy Treatment  Patient Details  Name: Melvin [name] MRN: [id number] Date of Birth: 05-10-1945 Referring Provider (PT): Hulan Saas, DO   Encounter Date: 05/20/2020   PT End of Session - 05/20/20 [id number]    Visit Number 7    Date for PT Re-Evaluation 07/01/20    Authorization Type BCBS-State health    PT Start Time 1401    PT Stop Time 1443    PT Time Calculation (min) 42 min    Activity Tolerance Patient tolerated treatment well    Behavior During Therapy [facility] for tasks assessed/performed           Past Medical History:  Diagnosis Date  . Cancer [facility])     Past Surgical History:  Procedure Laterality Date  . LYMPHADENECTOMY Bilateral 09/12/2012   Procedure: LYMPHADENECTOMY;  Surgeon: Alexis Frock, MD;  [facility]: [facility];  Service: Urology;  Laterality: Bilateral;  . ROBOT ASSISTED LAPAROSCOPIC RADICAL PROSTATECTOMY N/A 09/12/2012   Procedure: ROBOTIC ASSISTED LAPAROSCOPIC RADICAL PROSTATECTOMY;  Surgeon: Alexis Frock, MD;  [facility];  Service: Urology;  Laterality: N/A;    There were no vitals filed for this visit.   Subjective Assessment - 05/20/20 1403    Subjective Lapse in treatment since 04/17/20.  Pt reports that pain comes and goes.    Currently in Pain? No/denies    Pain Score --   up to 5/10 max   Aggravating Factors  lifting, activity, bending over    Pain Relieving Factors sitting down, stretching, reduced activity              [facility] PT Assessment - 05/20/20 0001      Assessment   Medical Diagnosis Degenerative changes lumbar spine    Referring Provider (PT) Hulan Saas, DO      Observation/Other Assessments   Observations pt standing with signficant  Rt lateral shift of the trunk    Focus on Therapeutic Outcomes (FOTO)  43% limitation      Posture/Postural Control    Posture Comments slouched sitting posture; excessive kyphosis in addition to right scoliosis       Palpation   Palpation comment palpable trigger point and tenderness Lt QL, lumbar paraspinals                          [facility] Adult PT Treatment/Exercise - 05/20/20 0001      Exercises   Exercises Knee/Hip      Lumbar Exercises: Aerobic   Nustep L2  x 10  min PT present to review updates      Lumbar Exercises: Standing   Row Strengthening;Both;20 reps;Theraband    Row Limitations 20#    Shoulder Extension Strengthening;Right;Left;20 reps;Theraband    Shoulder Extension Limitations 15#    Other Standing Lumbar Exercises against wall: bil UE elevation 10x    Other Standing Lumbar Exercises wall push ups 2x10      Lumbar Exercises: Seated   Other Seated Lumbar Exercises ball roll outs: to the Rt to stretch Lt lumbar      Knee/Hip Exercises: Standing   Hip Extension Stengthening;2 sets;10 reps    Extension Limitations tactile cues for alignment    Rebounder weight shifting 3 ways with emphasis on alignment and gluteal/core activation x 1 min each  PT Short Term Goals - 04/09/20 1309      PT SHORT TERM GOAL #1   Title Pt will be independent and consistent with his initial HEP to improve posture awareness and decrease pain.    Status Achieved      PT SHORT TERM GOAL #2   Title --      PT SHORT TERM GOAL #3   Title --             PT Long Term Goals - 05/20/20 1404      PT LONG TERM GOAL #1   Title Pt will be independent with safe, self progressive HEP upright posture while standing/walking and improve/maintain spinal curvature    Time 6    Period Weeks    Status On-going    Target Date 07/01/20      PT LONG TERM GOAL #2   Title The patient will demonstrate good body mechanics with bending/stooping to pick up sticks in the yard    Status Achieved      PT LONG TERM GOAL #3   Title The patient will report a 50-65% improvement  in low back pain with ADLs including walking his dog    Baseline 40-50%    Time 6    Period Weeks    Status Revised    Target Date 07/01/20      PT LONG TERM GOAL #4   Title Pt will have improved trunk strength to 4-/5 to 4/5 evident by his ability to maintain a more upright posture    Baseline no change    Time 6    Period Weeks    Status On-going    Target Date 07/01/20      PT LONG TERM GOAL #5   Title FOTO functional outcome improved from 39% limitation to 33%    Baseline 43% limitation    Time 6    Period Weeks    Status On-going    Target Date 07/01/20                 Plan - 05/20/20 1424    Clinical Impression Statement Pt with lapse in treatment since 04/17/20.  Pt reports 40-50% overall improvement ins symptoms since the start of care.  Pt with chronic pelvic shift alignment with flexed trunk and demonstrates improving overall muscle endurance with standing postural correction for short periods of time however with fatigue and when sitting to rest, his curvature increases significantly.   Verbal and tactile cues for musculature activation of extensor muscles. Pt with 5/10 Lt lumbar pain with lifting, bending and periods of activity.  Slow progress toward goals due to lapse in treatment x 1 month.  Pt will continue to benefit from skilled PT to address postural strength/endurance, flexibility and alignment.    PT Frequency 2x / week    PT Duration 8 weeks    PT Treatment/Interventions ADLs/Self Care Home Management;Aquatic Therapy;Cryotherapy;Electrical Stimulation;Moist Heat;Functional mobility training;Neuromuscular re-education;Balance training;Therapeutic exercise;Therapeutic activities;Patient/family education;Manual techniques;Dry needling;Taping    PT Next Visit Plan continue postural strength, endurance, flexibility    PT Home Exercise Plan [id number] from previous course of PT    Consulted and Agree with Plan of Care Patient           Patient will benefit  from skilled therapeutic intervention in order to improve the following deficits and impairments:  Improper body mechanics,Pain,Postural dysfunction,Increased muscle spasms,Decreased mobility,Decreased activity tolerance,Decreased range of motion,Decreased strength,Hypomobility,Impaired flexibility,Decreased balance  Visit Diagnosis: Abnormal posture - Plan: PT  plan of care cert/re-cert  Chronic left-sided low back pain without sciatica - Plan: PT plan of care cert/re-cert  Muscle spasm of back - Plan: PT plan of care cert/re-cert     Problem List Patient Active Problem List   Diagnosis Date Noted  . Degenerative lumbar spinal stenosis 08/03/2019  . 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 Low back pain 10/30/2017  . Posterior tibial tendon dysfunction (PTTD) of right lower extremity 02/24/2017    [name], PT 05/20/20 2:44 PM  [facility] [address] [place], [place], [address] Phone: [phone]   Fax:  [phone]  Name: Melvin [name] MRN: [id number] Date of Birth: Sep 24, 1945

## 2020-06-01 ENCOUNTER — Other Ambulatory Visit: Payer: Self-pay

## 2020-06-01 ENCOUNTER — Ambulatory Visit: Payer: BC Managed Care – PPO

## 2020-06-01 DIAGNOSIS — M62830 Muscle spasm of back: Secondary | ICD-10-CM

## 2020-06-01 DIAGNOSIS — M545 Low back pain, unspecified: Secondary | ICD-10-CM

## 2020-06-01 DIAGNOSIS — R293 Abnormal posture: Secondary | ICD-10-CM

## 2020-06-01 DIAGNOSIS — G8929 Other chronic pain: Secondary | ICD-10-CM

## 2020-06-01 NOTE — Therapy (Signed)
[facility] [address] [place], [place], [address] Phone: [phone]   Fax:  [phone]  Physical Therapy Treatment  Patient Details  Name: Melvin [name] MRN: [id number] Date of Birth: November 05, 1945 Referring Provider (PT): Hulan Saas, DO   Encounter Date: 06/01/2020   PT End of Session - 06/01/20 1053    Visit Number 8    Date for PT Re-Evaluation 07/01/20    Authorization Type BCBS-State health    PT Start Time 1017    PT Stop Time 1056    PT Time Calculation (min) 39 min    Activity Tolerance Patient tolerated treatment well    Behavior During Therapy [facility] for tasks assessed/performed           Past Medical History:  Diagnosis Date  . Cancer [facility])     Past Surgical History:  Procedure Laterality Date  . LYMPHADENECTOMY Bilateral 09/12/2012   Procedure: LYMPHADENECTOMY;  Surgeon: Alexis Frock, MD;  [facility];  Service: Urology;  Laterality: Bilateral;  . ROBOT ASSISTED LAPAROSCOPIC RADICAL PROSTATECTOMY N/A 09/12/2012   Procedure: ROBOTIC ASSISTED LAPAROSCOPIC RADICAL PROSTATECTOMY;  Surgeon: Alexis Frock, MD;  [facility];  Service: Urology;  Laterality: N/A;    There were no vitals filed for this visit.   Subjective Assessment - 06/01/20 1020    Subjective I had a case of shingles on the right side of my head.  I am sore today.    Pertinent History haven't gone to the gym often b/c people not wearing mask at the gym: I do the treadmill 20-30 minutes pulley weight pull downs, stretches    Currently in Pain? No/denies                             [facility] Adult PT Treatment/Exercise - 06/01/20 0001      Lumbar Exercises: Aerobic   UBE (Upper Arm Bike) Arm bike: level 1.5x 6 minutes (3/3) with review of status with pt      Lumbar Exercises: Standing   Row Strengthening;Both;20 reps;Theraband    Row Limitations 20#    Shoulder Extension Strengthening;Right;Left;20  reps;Theraband    Shoulder Extension Limitations 15#    Other Standing Lumbar Exercises wall push ups 2x10      Lumbar Exercises: Seated   Other Seated Lumbar Exercises ball roll outs: to the Rt to stretch Lt lumbar      Knee/Hip Exercises: Standing   Hip Abduction Stengthening;Both;2 sets;10 reps    Hip Extension Stengthening;2 sets;10 reps    Extension Limitations tactile cues for alignment      Knee/Hip Exercises: Seated   Sit to Sand without UE support;20 reps   holding 10# kettlebell                   PT Short Term Goals - 04/09/20 1309      PT SHORT TERM GOAL #1   Title Pt will be independent and consistent with his initial HEP to improve posture awareness and decrease pain.    Status Achieved      PT SHORT TERM GOAL #2   Title --      PT SHORT TERM GOAL #3   Title --             PT Long Term Goals - 05/20/20 1404      PT LONG TERM GOAL #1   Title Pt will be independent with safe, self progressive HEP  upright posture while standing/walking and improve/maintain spinal curvature    Time 6    Period Weeks    Status On-going    Target Date 07/01/20      PT LONG TERM GOAL #2   Title The patient will demonstrate good body mechanics with bending/stooping to pick up sticks in the yard    Status Achieved      PT LONG TERM GOAL #3   Title The patient will report a 50-65% improvement in low back pain with ADLs including walking his dog    Baseline 40-50%    Time 6    Period Weeks    Status Revised    Target Date 07/01/20      PT LONG TERM GOAL #4   Title Pt will have improved trunk strength to 4-/5 to 4/5 evident by his ability to maintain a more upright posture    Baseline no change    Time 6    Period Weeks    Status On-going    Target Date 07/01/20      PT LONG TERM GOAL #5   Title FOTO functional outcome improved from 39% limitation to 33%    Baseline 43% limitation    Time 6    Period Weeks    Status On-going    Target Date 07/01/20                  Plan - 06/01/20 1029    Clinical Impression Statement Pt with a lapse in treatment since 05/20/20 due to a bout of shingles last week.  Pt has not been able to do much exercise due to not feeling well with this.  Pt continues to report 40-50% overall improvement in symptoms since the start of care.  Pt with chronic pelvic shift alignment with flexed trunk and demonstrates improving overall muscle endurance with standing postural correction for short periods of time however with fatigue and when sitting to rest, his curvature increases significantly.   Verbal and tactile cues for musculature activation of extensor muscles. Pt with intermittent Lt lumbar pain with lifting, bending and periods of activity.  Slow progress toward goals due to lapse in treatment x 1 month followed by bout of shingles.  Pt will continue to benefit from skilled PT to address postural strength/endurance, flexibility and alignment.    PT Frequency 2x / week    PT Duration 8 weeks    PT Treatment/Interventions ADLs/Self Care Home Management;Aquatic Therapy;Cryotherapy;Electrical Stimulation;Moist Heat;Functional mobility training;Neuromuscular re-education;Balance training;Therapeutic exercise;Therapeutic activities;Patient/family education;Manual techniques;Dry needling;Taping    PT Next Visit Plan continue postural strength, endurance, flexibility    PT Home Exercise Plan [id number] from previous course of PT    Consulted and Agree with Plan of Care Patient           Patient will benefit from skilled therapeutic intervention in order to improve the following deficits and impairments:  Improper body mechanics,Pain,Postural dysfunction,Increased muscle spasms,Decreased mobility,Decreased activity tolerance,Decreased range of motion,Decreased strength,Hypomobility,Impaired flexibility,Decreased balance  Visit Diagnosis: Abnormal posture  Chronic left-sided low back pain without sciatica  Muscle spasm of  back     Problem List Patient Active Problem List   Diagnosis Date Noted  . Degenerative lumbar spinal stenosis 08/03/2019  . 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 Low back pain 10/30/2017  . Posterior tibial tendon dysfunction (PTTD) of right lower extremity 02/24/2017    [name], PT 06/01/20 10:58 AM  [facility] [address] [place],  [place], [address] Phone: [phone]   Fax:  [phone]  Name: Melvin [name] MRN: [id number] Date of Birth: 1946/01/26

## 2020-06-09 NOTE — Progress Notes (Unsigned)
[name] [place] Phone: [phone] Subjective:   Melvin [name], am serving as a scribe for Dr. Hulan Saas.  This visit occurred during the SARS-CoV-2 public health emergency.  Safety protocols were in place, including screening questions prior to the visit, additional usage of staff PPE, and extensive cleaning of exam room while observing appropriate contact time as indicated for disinfecting solutions.   I'm seeing this patient by the request  of:  Orpah Melter, MD  CC: Back pain follow-up  PYK:[id number]   03/11/2020 Patient does have significant arthritic changes with scoliosis.  Patient is continuing to stay fairly well active.  Doing well with the physical therapy and is going to work with 2 different ones at this time.  Patient seems to be doing relatively well with the duloxetine and we can always increase.  We discussed with patient though having a history of prostate cancer as well if he was somewhat concerned we can consider the possibility of MRI.  Patient would like to have this ordered because he continues to fatigue even after about 10 minutes of walking.  Depending on findings he could be a candidate for epidural injections.  Patient will increase activity slowly.  Follow-up with me again after imaging to discuss further treatment options or if he decides to decline imaging none to check in in 3 months total time with patient as well as his wife today 35 minutes  Update 06/10/2020 Melvin [name] is a 75 y.o. male coming in with complaint of lumbar spinal stenosis. Patient states that his back is so-so. Still finds himself favoring one side and is trying to keep aware of when he is favoring so he can correct. Patient is going to PT and doing the exercises, states he is not worse and feels that's a good sign.  Patient continues to stay fairly active.  Patient is doing 2 different types of physical therapy at this  time.     Patient has been going to physical therapy  Past Medical History:  Diagnosis Date  . Cancer [facility])    Past Surgical History:  Procedure Laterality Date  . LYMPHADENECTOMY Bilateral 09/12/2012   Procedure: LYMPHADENECTOMY;  Surgeon: Alexis Frock, MD;  Location: [facility];  Service: Urology;  Laterality: Bilateral;  . ROBOT ASSISTED LAPAROSCOPIC RADICAL PROSTATECTOMY N/A 09/12/2012   Procedure: ROBOTIC ASSISTED LAPAROSCOPIC RADICAL PROSTATECTOMY;  Surgeon: Alexis Frock, MD;  Location: [facility];  Service: Urology;  Laterality: N/A;   Social History   Socioeconomic History  . Marital status: Divorced    Spouse name: Not on file  . Number of children: Not on file  . Years of education: Not on file  . Highest education level: Not on file  Occupational History  . Not on file  Tobacco Use  . Smoking status: Former Smoker    Quit date: 09/12/1992    Years since quitting: 27.7  . Smokeless tobacco: Never Used  Substance and Sexual Activity  . Alcohol use: Not on file  . Drug use: Not on file  . Sexual activity: Not on file  Other Topics Concern  . Not on file  Social History Narrative  . Not on file   Social Determinants of Health   Financial Resource Strain: Not on file  Food Insecurity: Not on file  Transportation Needs: Not on file  Physical Activity: Not on file  Stress: Not on file  Social Connections: Not on file   Allergies  Allergen Reactions  . Sulfa Antibiotics Hives   History reviewed. No pertinent family history.  Current Outpatient Medications (Endocrine & Metabolic):  .  predniSONE (DELTASONE) 20 MG tablet, Take 1 tablet (20 mg total) by mouth daily with breakfast. .  predniSONE (STERAPRED UNI-PAK 21 TAB) 10 MG (21) TBPK tablet, Take as directed   Current Outpatient Medications (Respiratory):  .  mometasone (NASONEX) 50 MCG/ACT nasal spray, Place 2 sprays into the nose daily as needed (for allergies).  Current Outpatient Medications  (Analgesics):  .  diclofenac (VOLTAREN) 75 MG EC tablet, TAKE 1 TABLET BY MOUTH TWICE DAILY. Marland Kitchen  HYDROcodone-acetaminophen (NORCO) 5-325 MG per tablet, Take 1-2 tablets by mouth every 6 (six) hours as needed for pain.   Current Outpatient Medications (Other):  .  ciprofloxacin (CIPRO) 500 MG tablet, Take 1 tablet (500 mg total) by mouth 2 (two) times daily. Start day prior to office visit for foley removal .  diclofenac sodium (VOLTAREN) 1 % GEL, Apply 2 g topically 4 (four) times daily. .  DULoxetine (CYMBALTA) 20 MG capsule, Take 1 capsule (20 mg total) by mouth daily. .  fluocinonide cream (LIDEX) 2.94 %, Apply 1 application topically 2 (two) times daily as needed. For skin rash .  latanoprost (XALATAN) 0.005 % ophthalmic solution, Place 1 drop into both eyes at bedtime. Marland Kitchen  oxybutynin (DITROPAN) 5 MG tablet, Take 1 tablet (5 mg total) by mouth every 8 (eight) hours as needed. For bladder spasms while catheter in place .  ranitidine (ZANTAC) 150 MG tablet, Take 150 mg by mouth 2 (two) times daily. .  sennosides-docusate sodium (SENOKOT-S) 8.6-50 MG tablet, Take 1 tablet by mouth 2 (two) times daily. While taking pain meds to prevent constipation .  Vitamin D, Ergocalciferol, (DRISDOL) 1.25 MG (50000 UNIT) CAPS capsule, Take 1 capsule (50,000 Units total) by mouth every 7 (seven) days.   Reviewed prior external information including notes and imaging from  primary care provider As well as notes that were available from care everywhere and other healthcare systems.  Past medical history, social, surgical and family history all reviewed in electronic medical record.  No pertanent information unless stated regarding to the chief complaint.   Review of Systems:  No headache, visual changes, nausea, vomiting, diarrhea, constipation, dizziness, abdominal pain, skin rash, fevers, chills, night sweats, weight loss, swollen lymph nodes, body aches, joint swelling, chest pain, shortness of breath, mood  changes. POSITIVE muscle aches but mild  Objective  Blood pressure 112/62, pulse 75, height 6\' 2"  (1.88 m), weight 187 lb (84.8 kg), SpO2 99 %.   General: No apparent distress alert and oriented x3 mood and affect normal, dressed appropriately.  HEENT: Pupils equal, extraocular movements intact  Respiratory: Patient's speak in full sentences and does not appear short of breath  Cardiovascular: No lower extremity edema, non tender, no erythema  Gait mild antalgic MSK:   Patient has significant arthritic changes.  Patient has significant scoliosis of the back noted.  Patient does have mild tightness with Corky Sox.  Mild tenderness to palpation of the paraspinal musculature of the lumbar spine with worsening extension of the back.  Patient has some 4+ out of 5 strength of lower extremities but seems to be symmetric.  Significant pes planus of the feet still noted with overpronation of the hindfoot.    Impression and Recommendations:     The above documentation has been reviewed and is accurate and complete Lyndal Pulley, DO

## 2020-06-10 ENCOUNTER — Other Ambulatory Visit: Payer: Self-pay

## 2020-06-10 ENCOUNTER — Ambulatory Visit: Payer: BC Managed Care – PPO | Attending: Family Medicine

## 2020-06-10 ENCOUNTER — Encounter: Payer: Self-pay | Admitting: Family Medicine

## 2020-06-10 ENCOUNTER — Ambulatory Visit (INDEPENDENT_AMBULATORY_CARE_PROVIDER_SITE_OTHER): Payer: BC Managed Care – PPO | Admitting: Family Medicine

## 2020-06-10 DIAGNOSIS — M48061 Spinal stenosis, lumbar region without neurogenic claudication: Secondary | ICD-10-CM

## 2020-06-10 DIAGNOSIS — M62830 Muscle spasm of back: Secondary | ICD-10-CM

## 2020-06-10 DIAGNOSIS — M545 Low back pain, unspecified: Secondary | ICD-10-CM | POA: Insufficient documentation

## 2020-06-10 DIAGNOSIS — G8929 Other chronic pain: Secondary | ICD-10-CM | POA: Diagnosis present

## 2020-06-10 DIAGNOSIS — R293 Abnormal posture: Secondary | ICD-10-CM | POA: Diagnosis not present

## 2020-06-10 NOTE — Assessment & Plan Note (Signed)
Continues to do very well at this time.  We discussed with patient in great length.  Patient has been taking minimal medications.  Patient is seen other providers.  Does not have any history of the prostate and we discussed the potential for the MRI again.  Patient states that he is not having significant pain and would not likely want the epidurals.  He will continue with formal physical therapy.  I dressed all questions and he will send more my chart questions if necessary.  Patient is accompanied with wife who did ask good questions as well.  Follow-up with me again in 2 to 3 months.  Total time reviewing with patient 30 minutes

## 2020-06-10 NOTE — Patient Instructions (Addendum)
Good to see you  I am glad the back id getting no worse Still think the MRI can be of some benefit but talk to Dr. Tammi Klippel Continue to work with PT if you feel like getting benefit no change in medicine See me again in 2-3 months

## 2020-06-10 NOTE — Addendum Note (Signed)
Addended by: Pollyann Glen on: 06/10/2020 01:23 PM   Modules accepted: Orders

## 2020-06-10 NOTE — Therapy (Signed)
[facility] [address] [place], [place], [address] Phone: [phone]   Fax:  [phone]  Physical Therapy Treatment  Patient Details  Name: Melvin [name] MRN: [id number] Date of Birth: Nov 19, 1945 Referring Provider (PT): Hulan Saas, DO   Encounter Date: 06/10/2020   PT End of Session - 06/10/20 1133    Visit Number 9    Date for PT Re-Evaluation 07/01/20    Authorization Type BCBS-State health    PT Start Time 1103    PT Stop Time 1135   pt requested to leave early   PT Time Calculation (min) 32 min    Activity Tolerance Patient tolerated treatment well    Behavior During Therapy [facility] for tasks assessed/performed           Past Medical History:  Diagnosis Date  . Cancer [facility])     Past Surgical History:  Procedure Laterality Date  . LYMPHADENECTOMY Bilateral 09/12/2012   Procedure: LYMPHADENECTOMY;  Surgeon: Alexis Frock, MD;  Location: [facility];  Service: Urology;  Laterality: Bilateral;  . ROBOT ASSISTED LAPAROSCOPIC RADICAL PROSTATECTOMY N/A 09/12/2012   Procedure: ROBOTIC ASSISTED LAPAROSCOPIC RADICAL PROSTATECTOMY;  Surgeon: Alexis Frock, MD;  Location: [facility];  Service: Urology;  Laterality: N/A;    There were no vitals filed for this visit.   Subjective Assessment - 06/10/20 1104    Subjective My back is stiff.  It loosens up as I move.    Patient Stated Goals Try to strengthen those muscles the doctor talked about;  I think I pull to the right.  I would like to walk more and not stoop over (walk straight);  work on posture    Currently in Pain? No/denies    Pain Score --   Just stiffness today                            [facility] Adult PT Treatment/Exercise - 06/10/20 0001      Lumbar Exercises: Aerobic   Nustep L2  x 6  min PT present to review updates      Lumbar Exercises: Standing   Row Strengthening;Both;20 reps;Theraband    Row Limitations 20#    Shoulder Extension  Strengthening;Right;Left;20 reps;Theraband    Shoulder Extension Limitations 15#      Lumbar Exercises: Seated   Other Seated Lumbar Exercises ball roll outs: to the Rt to stretch Lt lumbar      Knee/Hip Exercises: Standing   Hip Abduction Stengthening;Both;2 sets;10 reps    Abduction Limitations standing on foam pad    Hip Extension Stengthening;2 sets;10 reps    Extension Limitations tactile cues for alignment, standing on foam pad                    PT Short Term Goals - 06/10/20 1106      PT SHORT TERM GOAL #1   Title Pt will be independent and consistent with his initial HEP to improve posture awareness and decrease pain.    Status Achieved             PT Long Term Goals - 06/10/20 1106      PT LONG TERM GOAL #1   Title Pt will be independent with safe, self progressive HEP upright posture while standing/walking and improve/maintain spinal curvature    Time 6    Period Weeks    Status On-going      PT LONG TERM GOAL #2  Title The patient will demonstrate good body mechanics with bending/stooping to pick up sticks in the yard    Baseline working on alignment and mechanics    Time 8    Period Weeks    Status On-going      PT LONG TERM GOAL #3   Title The patient will report a 50-65% improvement in low back pain with ADLs including walking his dog    Baseline 30-50%- depends    Time 6    Period Weeks    Status On-going      PT LONG TERM GOAL #4   Title Pt will have improved trunk strength to 4-/5 to 4/5 evident by his ability to maintain a more upright posture    Time 6    Period Weeks    Status On-going      PT LONG TERM GOAL #5   Title FOTO functional outcome improved from 39% limitation to 33%    Baseline 43% limitation    Status On-going                 Plan - 06/10/20 1112    Clinical Impression Statement Pt has returned to regular exercise routine after his bout of shingles.  Pt continues to report 30-50% overall improvement in  symptoms since the start of care.  Pt with chronic pelvic shift alignment with flexed trunk and demonstrates improving overall muscle endurance with standing postural correction for short periods of time however with fatigue and when sitting to rest, his curvature increases significantly.   Verbal and tactile cues for musculature activation of extensor muscles. Pt with intermittent Lt lumbar pain with lifting, bending and periods of activity.  Slow progress toward goals due to lapse in treatment x 1 month followed by bout of shingles.  Pt will continue to benefit from skilled PT to address postural strength/endurance, flexibility and alignment.    PT Frequency 2x / week    PT Duration 8 weeks    PT Treatment/Interventions ADLs/Self Care Home Management;Aquatic Therapy;Cryotherapy;Electrical Stimulation;Moist Heat;Functional mobility training;Neuromuscular re-education;Balance training;Therapeutic exercise;Therapeutic activities;Patient/family education;Manual techniques;Dry needling;Taping    PT Next Visit Plan continue postural strength, endurance, flexibility    PT Home Exercise Plan [id number] from previous course of PT    Consulted and Agree with Plan of Care Patient           Patient will benefit from skilled therapeutic intervention in order to improve the following deficits and impairments:  Improper body mechanics,Pain,Postural dysfunction,Increased muscle spasms,Decreased mobility,Decreased activity tolerance,Decreased range of motion,Decreased strength,Hypomobility,Impaired flexibility,Decreased balance  Visit Diagnosis: Abnormal posture  Chronic left-sided low back pain without sciatica  Muscle spasm of back     Problem List Patient Active Problem List   Diagnosis Date Noted  . Degenerative lumbar spinal stenosis 08/03/2019  . 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 Low back pain 10/30/2017  . Posterior tibial tendon dysfunction (PTTD) of right lower extremity 02/24/2017     [name], PT 06/10/20 11:34 AM  [facility] [address] [place], [place], [address] Phone: [phone]   Fax:  [phone]  Name: Melvin [name] MRN: [id number] Date of Birth: [birth year]

## 2020-06-17 ENCOUNTER — Ambulatory Visit: Payer: BC Managed Care – PPO

## 2020-06-17 ENCOUNTER — Other Ambulatory Visit: Payer: Self-pay

## 2020-06-17 DIAGNOSIS — R293 Abnormal posture: Secondary | ICD-10-CM

## 2020-06-17 DIAGNOSIS — M545 Low back pain, unspecified: Secondary | ICD-10-CM

## 2020-06-17 DIAGNOSIS — G8929 Other chronic pain: Secondary | ICD-10-CM

## 2020-06-17 DIAGNOSIS — M62830 Muscle spasm of back: Secondary | ICD-10-CM

## 2020-06-17 NOTE — Therapy (Signed)
[facility] [address], [place], [place], [address] Phone: [phone]   Fax:  [phone]  Physical Therapy Treatment  Patient Details  Name: Melvin [name] MRN: [id number] Date of Birth: 1945-05-24 Referring Provider (PT): Hulan Saas, DO   Encounter Date: 06/17/2020   PT End of Session - 06/17/20 1141    Visit Number 10    Date for PT Re-Evaluation 07/01/20    Authorization Type BCBS-State health    PT Start Time 1103    PT Stop Time 1142    PT Time Calculation (min) 39 min    Activity Tolerance Patient tolerated treatment well    Behavior During Therapy [facility] for tasks assessed/performed           Past Medical History:  Diagnosis Date  . Cancer [facility])     Past Surgical History:  Procedure Laterality Date  . LYMPHADENECTOMY Bilateral 09/12/2012   Procedure: LYMPHADENECTOMY;  Surgeon: Alexis Frock, MD;  Location: [facility];  Service: Urology;  Laterality: Bilateral;  . ROBOT ASSISTED LAPAROSCOPIC RADICAL PROSTATECTOMY N/A 09/12/2012   Procedure: ROBOTIC ASSISTED LAPAROSCOPIC RADICAL PROSTATECTOMY;  Surgeon: Alexis Frock, MD;  Location: [facility];  Service: Urology;  Laterality: N/A;    There were no vitals filed for this visit.   Subjective Assessment - 06/17/20 1142    Subjective I'm working on standing up straighter    Currently in Pain? No/denies                             [facility] Adult PT Treatment/Exercise - 06/17/20 0001      Lumbar Exercises: Aerobic   Nustep L2  x 10  min PT present to review updates      Lumbar Exercises: Standing   Row Strengthening;Both;20 reps;Theraband    Row Limitations 29#    Shoulder Extension Strengthening;Right;Left;20 reps;Theraband    Shoulder Extension Limitations 15#    Other Standing Lumbar Exercises wall push ups 2x10      Lumbar Exercises: Seated   Other Seated Lumbar Exercises ball roll outs: to the Rt to stretch Lt lumbar    Other Seated  Lumbar Exercises press into foam roll 5" hold 2x10      Knee/Hip Exercises: Standing   Hip Abduction Stengthening;Both;2 sets;10 reps    Abduction Limitations standing on foam pad    Hip Extension Stengthening;2 sets;10 reps    Extension Limitations tactile cues for alignment, standing on foam pad      Knee/Hip Exercises: Seated   Sit to Sand without UE support;20 reps   holding 10# kettlebell                   PT Short Term Goals - 06/10/20 1106      PT SHORT TERM GOAL #1   Title Pt will be independent and consistent with his initial HEP to improve posture awareness and decrease pain.    Status Achieved             PT Long Term Goals - 06/10/20 1106      PT LONG TERM GOAL #1   Title Pt will be independent with safe, self progressive HEP upright posture while standing/walking and improve/maintain spinal curvature    Time 6    Period Weeks    Status On-going      PT LONG TERM GOAL #2   Title The patient will demonstrate good body mechanics with bending/stooping to pick up  sticks in the yard    Baseline working on Theatre manager    Time 8    Period Weeks    Status On-going      PT LONG TERM GOAL #3   Title The patient will report a 50-65% improvement in low back pain with ADLs including walking his dog    Baseline 30-50%- depends    Time 6    Period Weeks    Status On-going      PT LONG TERM GOAL #4   Title Pt will have improved trunk strength to 4-/5 to 4/5 evident by his ability to maintain a more upright posture    Time 6    Period Weeks    Status On-going      PT LONG TERM GOAL #5   Title FOTO functional outcome improved from 39% limitation to 33%    Baseline 43% limitation    Status On-going                 Plan - 06/17/20 1112    Clinical Impression Statement Pt continues to report 30-50% overall improvement in symptoms since the start of care.  Pt with chronic pelvic shift alignment with flexed trunk and demonstrates improving  overall muscle endurance with standing postural correction for short periods of time with exercise.    Verbal and tactile cues throughout session for musculature activation of extensor muscles although this is not needed as much as previously needed. Pt with intermittent Lt lumbar pain with lifting, bending and periods of activity.  Slow progress toward goals due to lapse in treatment x 1 month followed by bout of shingles.  Pt will continue to benefit from skilled PT to address chronic postural imbalances, pain and endurance.    PT Frequency 2x / week    PT Duration 8 weeks    PT Treatment/Interventions ADLs/Self Care Home Management;Aquatic Therapy;Cryotherapy;Electrical Stimulation;Moist Heat;Functional mobility training;Neuromuscular re-education;Balance training;Therapeutic exercise;Therapeutic activities;Patient/family education;Manual techniques;Dry needling;Taping    PT Next Visit Plan continue postural strength, endurance, flexibility    PT Home Exercise Plan [id number] from previous course of PT    Consulted and Agree with Plan of Care Patient           Patient will benefit from skilled therapeutic intervention in order to improve the following deficits and impairments:  Improper body mechanics,Pain,Postural dysfunction,Increased muscle spasms,Decreased mobility,Decreased activity tolerance,Decreased range of motion,Decreased strength,Hypomobility,Impaired flexibility,Decreased balance  Visit Diagnosis: Chronic left-sided low back pain without sciatica  Abnormal posture  Muscle spasm of back     Problem List Patient Active Problem List   Diagnosis Date Noted  . Degenerative lumbar spinal stenosis 08/03/2019  . 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 Low back pain 10/30/2017  . Posterior tibial tendon dysfunction (PTTD) of right lower extremity 02/24/2017     [name], PT 06/17/20 11:43 AM  [facility] [address], [place], [place], [address] Phone: [phone]   Fax:  [phone]  Name: Melvin [name] MRN: [id number] Date of Birth: June 06, 1945

## 2020-06-24 ENCOUNTER — Other Ambulatory Visit: Payer: Self-pay

## 2020-06-24 ENCOUNTER — Ambulatory Visit: Payer: BC Managed Care – PPO

## 2020-06-24 DIAGNOSIS — R293 Abnormal posture: Secondary | ICD-10-CM

## 2020-06-24 DIAGNOSIS — G8929 Other chronic pain: Secondary | ICD-10-CM

## 2020-06-24 DIAGNOSIS — M62830 Muscle spasm of back: Secondary | ICD-10-CM

## 2020-06-24 DIAGNOSIS — M545 Low back pain, unspecified: Secondary | ICD-10-CM

## 2020-06-24 NOTE — Therapy (Signed)
[facility] [address] [place], [place], [address] Phone: [phone]   Fax:  [phone]  Physical Therapy Treatment  Patient Details  Name: Melvin [name] MRN: [id number] Date of Birth: 17-Apr-1946 Referring Provider (PT): Hulan Saas, DO   Encounter Date: 06/24/2020   PT End of Session - 06/24/20 1142    Visit Number 11    Date for PT Re-Evaluation 07/01/20    Authorization Type BCBS-State health    PT Start Time 1101    PT Stop Time 1141    PT Time Calculation (min) 40 min    Activity Tolerance Patient tolerated treatment well    Behavior During Therapy [facility] for tasks assessed/performed           Past Medical History:  Diagnosis Date  . Cancer [facility])     Past Surgical History:  Procedure Laterality Date  . LYMPHADENECTOMY Bilateral 09/12/2012   Procedure: LYMPHADENECTOMY;  Surgeon: Alexis Frock, MD;  Location: [facility];  Service: Urology;  Laterality: Bilateral;  . ROBOT ASSISTED LAPAROSCOPIC RADICAL PROSTATECTOMY N/A 09/12/2012   Procedure: ROBOTIC ASSISTED LAPAROSCOPIC RADICAL PROSTATECTOMY;  Surgeon: Alexis Frock, MD;  [facility]: [facility];  Service: Urology;  Laterality: N/A;    There were no vitals filed for this visit.   Subjective Assessment - 06/24/20 1102    Subjective I feeling pretty good.  I wake up stiff in the mornings and loosen as the day goes.    Currently in Pain? No/denies                             [facility] Adult PT Treatment/Exercise - 06/24/20 0001      Lumbar Exercises: Aerobic   Nustep L2  x 10  min PT present to review updates      Lumbar Exercises: Standing   Row Strengthening;Both;20 reps;Theraband    Theraband Level (Row) Level 4 (Blue)   standing on black pad   Shoulder Extension Strengthening;Right;Left;20 reps;Theraband    Theraband Level (Shoulder Extension) Level 4 (Blue)   standing on foam pad   Other Standing Lumbar Exercises wall push ups 2x10       Lumbar Exercises: Seated   Other Seated Lumbar Exercises ball roll outs: to the Rt to stretch Lt lumbar    Other Seated Lumbar Exercises press into foam roll 5" hold 2x10      Knee/Hip Exercises: Standing   Hip Abduction Stengthening;Both;2 sets;10 reps    Abduction Limitations standing on foam pad    Hip Extension Stengthening;2 sets;10 reps    Extension Limitations tactile cues for alignment, standing on foam pad      Knee/Hip Exercises: Seated   Sit to Sand without UE support;20 reps   holding 10# kettlebell                   PT Short Term Goals - 06/10/20 1106      PT SHORT TERM GOAL #1   Title Pt will be independent and consistent with his initial HEP to improve posture awareness and decrease pain.    Status Achieved             PT Long Term Goals - 06/10/20 1106      PT LONG TERM GOAL #1   Title Pt will be independent with safe, self progressive HEP upright posture while standing/walking and improve/maintain spinal curvature    Time 6    Period Weeks  Status On-going      PT LONG TERM GOAL #2   Title The patient will demonstrate good body mechanics with bending/stooping to pick up sticks in the yard    Baseline working on alignment and mechanics    Time 8    Period Weeks    Status On-going      PT LONG TERM GOAL #3   Title The patient will report a 50-65% improvement in low back pain with ADLs including walking his dog    Baseline 30-50%- depends    Time 6    Period Weeks    Status On-going      PT LONG TERM GOAL #4   Title Pt will have improved trunk strength to 4-/5 to 4/5 evident by his ability to maintain a more upright posture    Time 6    Period Weeks    Status On-going      PT LONG TERM GOAL #5   Title FOTO functional outcome improved from 39% limitation to 33%    Baseline 43% limitation    Status On-going                 Plan - 06/24/20 1118    Clinical Impression Statement Pt continues to report 50% overall improvement  in symptoms since the start of care.  Pt with chronic pelvic shift alignment with flexed trunk and demonstrates improving overall muscle endurance with standing postural correction for short periods of time with exercise.  Pt able to stand upright with increased challegne of standing on balance pad with resisted band exercise.  Pt is working on HEP and postural corrections at home and reports increased ease with upright posture. Pt will continue to benefit from skilled PT to address chronic postural imbalances, pain and endurance.    PT Frequency 2x / week    PT Duration 8 weeks    PT Treatment/Interventions ADLs/Self Care Home Management;Aquatic Therapy;Cryotherapy;Electrical Stimulation;Moist Heat;Functional mobility training;Neuromuscular re-education;Balance training;Therapeutic exercise;Therapeutic activities;Patient/family education;Manual techniques;Dry needling;Taping    PT Next Visit Plan continue postural strength, endurance, flexibility    PT Home Exercise Plan [id number] from previous course of PT    Consulted and Agree with Plan of Care Patient           Patient will benefit from skilled therapeutic intervention in order to improve the following deficits and impairments:  Improper body mechanics,Pain,Postural dysfunction,Increased muscle spasms,Decreased mobility,Decreased activity tolerance,Decreased range of motion,Decreased strength,Hypomobility,Impaired flexibility,Decreased balance  Visit Diagnosis: Chronic left-sided low back pain without sciatica  Abnormal posture  Muscle spasm of back     Problem List Patient Active Problem List   Diagnosis Date Noted  . Degenerative lumbar spinal stenosis 08/03/2019  . 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 Low back pain 10/30/2017  . Posterior tibial tendon dysfunction (PTTD) of right lower extremity 02/24/2017     Melvin [name], PT 06/24/20 11:44 AM  [facility] [address],  [place], [place], [address] Phone: [phone]   Fax:  [phone]  Name: Melvin [name] MRN: [id number] Date of Birth: [birth year]

## 2020-07-01 ENCOUNTER — Encounter: Payer: Self-pay | Admitting: Physical Therapy

## 2020-07-01 ENCOUNTER — Ambulatory Visit: Payer: BC Managed Care – PPO | Attending: Family Medicine | Admitting: Physical Therapy

## 2020-07-01 ENCOUNTER — Other Ambulatory Visit: Payer: Self-pay

## 2020-07-01 DIAGNOSIS — G8929 Other chronic pain: Secondary | ICD-10-CM | POA: Diagnosis present

## 2020-07-01 DIAGNOSIS — M62830 Muscle spasm of back: Secondary | ICD-10-CM

## 2020-07-01 DIAGNOSIS — R293 Abnormal posture: Secondary | ICD-10-CM | POA: Diagnosis present

## 2020-07-01 DIAGNOSIS — M545 Low back pain, unspecified: Secondary | ICD-10-CM | POA: Insufficient documentation

## 2020-07-01 NOTE — Therapy (Signed)
[facility] [address] [place], [place], [address] Phone: [phone]   Fax:  [phone]  Physical Therapy Treatment  Patient Details  Name: Melvin [name] MRN: [id number] Date of Birth: 1946-04-30 Referring Provider (PT): Hulan Saas, DO   Encounter Date: 07/01/2020   PT End of Session - 07/01/20 1105    Visit Number 12    Date for PT Re-Evaluation 07/29/20    Authorization Type BCBS-State health    PT Start Time 1100    PT Stop Time 1126   patinet had to leave early for work   PT Time Calculation (min) 26 min    Activity Tolerance Patient tolerated treatment well    Behavior During Therapy [facility] for tasks assessed/performed           Past Medical History:  Diagnosis Date  . Cancer [facility])     Past Surgical History:  Procedure Laterality Date  . LYMPHADENECTOMY Bilateral 09/12/2012   Procedure: LYMPHADENECTOMY;  Surgeon: Alexis Frock, MD;  [facility];  Service: Urology;  Laterality: Bilateral;  . ROBOT ASSISTED LAPAROSCOPIC RADICAL PROSTATECTOMY N/A 09/12/2012   Procedure: ROBOTIC ASSISTED LAPAROSCOPIC RADICAL PROSTATECTOMY;  Surgeon: Alexis Frock, MD;  [facility];  Service: Urology;  Laterality: N/A;    There were no vitals filed for this visit.   Subjective Assessment - 07/01/20 1107    Subjective Doing pretty good . My back still gets sore in the morning 3-4/10. Once I get moving it feels better.    Patient Stated Goals Try to strengthen those muscles the doctor talked about;  I think I pull to the right.  I would like to walk more and not stoop over (walk straight);  work on posture    Currently in Pain? No/denies              [facility] PT Assessment - 07/01/20 0001      Observation/Other Assessments   Focus on Therapeutic Outcomes (FOTO)  30% limitation                         [facility] Adult PT Treatment/Exercise - 07/01/20 0001      Lumbar Exercises: Aerobic   UBE  (Upper Arm Bike) L 1.5 x 4 min 2 fwd/bwd      Lumbar Exercises: Standing   Row Strengthening;Both;20 reps;Theraband    Theraband Level (Row) Level 4 (Blue)   standing on black pad   Shoulder Extension Strengthening;Right;Left;20 reps;Theraband    Theraband Level (Shoulder Extension) Level 4 (Blue)   standing on foam pad   Other Standing Lumbar Exercises OH shoulder flexion with eyes following hands to encourage spinal extension x 10    Other Standing Lumbar Exercises wall push ups 2x10      Knee/Hip Exercises: Standing   Hip Abduction Stengthening;Both;2 sets;10 reps    Abduction Limitations standing on foam pad    Hip Extension Stengthening;2 sets;10 reps      Knee/Hip Exercises: Seated   Sit to Sand without UE support;20 reps   holding 10# kettlebell                   PT Short Term Goals - 06/10/20 1106      PT SHORT TERM GOAL #1   Title Pt will be independent and consistent with his initial HEP to improve posture awareness and decrease pain.    Status Achieved  PT Long Term Goals - 07/01/20 1108      PT LONG TERM GOAL #1   Title Pt will be independent with safe, self progressive HEP upright posture while standing/walking and improve/maintain spinal curvature    Baseline Patient has been told by others his posture is better.    Status Partially Met      PT LONG TERM GOAL #2   Title The patient will demonstrate good body mechanics with bending/stooping to pick up sticks in the yard    Status Partially Met      PT LONG TERM GOAL #3   Title The patient will report a 50-65% improvement in low back pain with ADLs including walking his dog    Baseline 65% improvement    Status Achieved      PT LONG TERM GOAL #4   Title Pt will have improved trunk strength to 4-/5 to 4/5 evident by his ability to maintain a more upright posture    Status On-going      PT LONG TERM GOAL #5   Title FOTO functional outcome improved from 39% limitation to 33%     Baseline 30% limited    Status Achieved                 Plan - 07/01/20 1135    Clinical Impression Statement Patient is progressing well with long term goals. he reports 65% improvement overall and reports that people are telling him that is his posture is better and he is standing up straighter. His FOTO goal has been met. He reports he is very conscious of his posture at all times and says he is able to maintain his upright posture when he walks his dog x 20 min. He still demonstrates some postural weakness in the clinic with walking and therex and he will benefit from continued PT to meet remaining LTGS.    Personal Factors and Comorbidities Age;Time since onset of injury/illness/exacerbation;Fitness    Examination-Activity Limitations Stand;Locomotion Level    Examination-Participation Restrictions Yard Work;Other    PT Frequency 1x / week    PT Duration 4 weeks    PT Treatment/Interventions ADLs/Self Care Home Management;Aquatic Therapy;Cryotherapy;Electrical Stimulation;Moist Heat;Functional mobility training;Neuromuscular re-education;Balance training;Therapeutic exercise;Therapeutic activities;Patient/family education;Manual techniques;Dry needling;Taping    PT Next Visit Plan continue postural strength, endurance, flexibility    PT Home Exercise Plan [facility] from previous course of PT    Recommended Other Services recert sent 0/7/12    Consulted and Agree with Plan of Care Patient           Patient will benefit from skilled therapeutic intervention in order to improve the following deficits and impairments:  Improper body mechanics,Pain,Postural dysfunction,Increased muscle spasms,Decreased mobility,Decreased activity tolerance,Decreased range of motion,Decreased strength,Hypomobility,Impaired flexibility,Decreased balance  Visit Diagnosis: Chronic left-sided low back pain without sciatica  Abnormal posture  Muscle spasm of back     Problem List Patient Active  Problem List   Diagnosis Date Noted  . Degenerative lumbar spinal stenosis 08/03/2019  . 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 Low back pain 10/30/2017  . Posterior tibial tendon dysfunction (PTTD) of right lower extremity 02/24/2017    Madelyn Flavors PT 07/01/2020, 11:42 AM  [facility] [address], [place], [place], [address] Phone: [phone]   Fax:  [phone]  Name: Melvin [name] MRN: [id number] Date of Birth: 1945-06-11

## 2020-07-08 ENCOUNTER — Other Ambulatory Visit: Payer: Self-pay

## 2020-07-08 ENCOUNTER — Ambulatory Visit: Payer: BC Managed Care – PPO

## 2020-07-08 DIAGNOSIS — G8929 Other chronic pain: Secondary | ICD-10-CM

## 2020-07-08 DIAGNOSIS — M545 Low back pain, unspecified: Secondary | ICD-10-CM | POA: Diagnosis not present

## 2020-07-08 DIAGNOSIS — R293 Abnormal posture: Secondary | ICD-10-CM

## 2020-07-08 DIAGNOSIS — M62830 Muscle spasm of back: Secondary | ICD-10-CM

## 2020-07-08 NOTE — Patient Instructions (Signed)
pro

## 2020-07-08 NOTE — Therapy (Signed)
[facility] [address] [place], [place], [address] Phone: [phone]   Fax:  [phone]  Physical Therapy Treatment  Patient Details  Name: Melvin [name] MRN: [id number] Date of Birth: 10-17-1945 Referring Provider (PT): Hulan Saas, DO   Encounter Date: 07/08/2020   PT End of Session - 07/08/20 1610    Visit Number 13    Date for PT Re-Evaluation 07/29/20    Authorization Type BCBS-State health    PT Start Time 6606    PT Stop Time 1611    PT Time Calculation (min) 40 min    Activity Tolerance Patient tolerated treatment well    Behavior During Therapy [facility] for tasks assessed/performed           Past Medical History:  Diagnosis Date  . Cancer [facility])     Past Surgical History:  Procedure Laterality Date  . LYMPHADENECTOMY Bilateral 09/12/2012   Procedure: LYMPHADENECTOMY;  Surgeon: Alexis Frock, MD;  [facility];  Service: Urology;  Laterality: Bilateral;  . ROBOT ASSISTED LAPAROSCOPIC RADICAL PROSTATECTOMY N/A 09/12/2012   Procedure: ROBOTIC ASSISTED LAPAROSCOPIC RADICAL PROSTATECTOMY;  Surgeon: Alexis Frock, MD;  [facility];  Service: Urology;  Laterality: N/A;    There were no vitals filed for this visit.   Subjective Assessment - 07/08/20 1540    Subjective I am doing well.  I have been good this week.    Currently in Pain? No/denies   max LBP is 4/10                            [facility] Adult PT Treatment/Exercise - 07/08/20 0001      Lumbar Exercises: Aerobic   Nustep L2  x 10  min PT present to review updates      Lumbar Exercises: Standing   Row Strengthening;Both;20 reps;Theraband    Theraband Level (Row) Level 4 (Blue)   standing on black pad   Shoulder Extension Strengthening;Right;Left;20 reps;Theraband    Theraband Level (Shoulder Extension) Level 4 (Blue)   standing on foam pad   Other Standing Lumbar Exercises wall push ups 2x10      Lumbar Exercises:  Seated   Other Seated Lumbar Exercises shoulder flexion 2# 2x10      Knee/Hip Exercises: Standing   Hip Abduction Stengthening;Both;2 sets;10 reps    Abduction Limitations standing on foam pad    Hip Extension Stengthening;2 sets;10 reps      Knee/Hip Exercises: Seated   Sit to Sand without UE support;20 reps   holding 10# kettlebell                   PT Short Term Goals - 06/10/20 1106      PT SHORT TERM GOAL #1   Title Pt will be independent and consistent with his initial HEP to improve posture awareness and decrease pain.    Status Achieved             PT Long Term Goals - 07/01/20 1108      PT LONG TERM GOAL #1   Title Pt will be independent with safe, self progressive HEP upright posture while standing/walking and improve/maintain spinal curvature    Baseline Patient has been told by others his posture is better.    Status Partially Met      PT LONG TERM GOAL #2   Title The patient will demonstrate good body mechanics with bending/stooping to pick up sticks in  the yard    Status Partially Met      PT LONG TERM GOAL #3   Title The patient will report a 50-65% improvement in low back pain with ADLs including walking his dog    Baseline 65% improvement    Status Achieved      PT LONG TERM GOAL #4   Title Pt will have improved trunk strength to 4-/5 to 4/5 evident by his ability to maintain a more upright posture    Status On-going      PT LONG TERM GOAL #5   Title FOTO functional outcome improved from 39% limitation to 33%    Baseline 30% limited    Status Achieved                 Plan - 07/08/20 1555    Clinical Impression Statement Patient is progressing well with long term goals. He reports 65% improvement overall and reports that people are telling him that is his posture is better and he is standing up straighter. His FOTO goal has been met. Pt is making postural corrections at home and is able to maintain his upright posture when he walks  his dog x 20 min. He still demonstrates some postural weakness in the clinic although this endurance is improved.  Pt will continue to benefit from skilled PT to improve strength, postural endurance and flexibility.    PT Frequency 1x / week    PT Duration 4 weeks    PT Treatment/Interventions ADLs/Self Care Home Management;Aquatic Therapy;Cryotherapy;Electrical Stimulation;Moist Heat;Functional mobility training;Neuromuscular re-education;Balance training;Therapeutic exercise;Therapeutic activities;Patient/family education;Manual techniques;Dry needling;Taping    PT Next Visit Plan continue postural strength, endurance, flexibility    PT Home Exercise Plan [id number] from previous course of PT    Consulted and Agree with Plan of Care Patient           Patient will benefit from skilled therapeutic intervention in order to improve the following deficits and impairments:  Improper body mechanics,Pain,Postural dysfunction,Increased muscle spasms,Decreased mobility,Decreased activity tolerance,Decreased range of motion,Decreased strength,Hypomobility,Impaired flexibility,Decreased balance  Visit Diagnosis: Chronic left-sided low back pain without sciatica  Abnormal posture  Muscle spasm of back     Problem List Patient Active Problem List   Diagnosis Date Noted  . Degenerative lumbar spinal stenosis 08/03/2019  . 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 Low back pain 10/30/2017  . Posterior tibial tendon dysfunction (PTTD) of right lower extremity 02/24/2017   Melvin [name], PT 07/08/20 4:18 PM  [facility] [address], [place], [place], [address] Phone: [phone]   Fax:  [phone]  Name: Melvin [name] MRN: [id number] Date of Birth: 08/26/1945

## 2020-07-15 ENCOUNTER — Other Ambulatory Visit: Payer: Self-pay

## 2020-07-15 ENCOUNTER — Ambulatory Visit: Payer: BC Managed Care – PPO

## 2020-07-15 DIAGNOSIS — G8929 Other chronic pain: Secondary | ICD-10-CM

## 2020-07-15 DIAGNOSIS — M545 Low back pain, unspecified: Secondary | ICD-10-CM

## 2020-07-15 DIAGNOSIS — R293 Abnormal posture: Secondary | ICD-10-CM

## 2020-07-15 DIAGNOSIS — M62830 Muscle spasm of back: Secondary | ICD-10-CM

## 2020-07-15 NOTE — Therapy (Signed)
[facility] [address] [place], [place], [address] Phone: [phone]   Fax:  [phone]  Physical Therapy Treatment  Patient Details  Name: Melvin [name] MRN: [id number] Date of Birth: 02/02/1946 Referring Provider (PT): Hulan Saas, DO   Encounter Date: 07/15/2020   PT End of Session - 07/15/20 1146    Visit Number 14    Date for PT Re-Evaluation 07/29/20    Authorization Type BCBS-State health    PT Start Time 1100    PT Stop Time 1146    PT Time Calculation (min) 46 min    Activity Tolerance Patient tolerated treatment well    Behavior During Therapy [facility] for tasks assessed/performed           Past Medical History:  Diagnosis Date  . Cancer [facility])     Past Surgical History:  Procedure Laterality Date  . LYMPHADENECTOMY Bilateral 09/12/2012   Procedure: LYMPHADENECTOMY;  Surgeon: Alexis Frock, MD;  [facility];  Service: Urology;  Laterality: Bilateral;  . ROBOT ASSISTED LAPAROSCOPIC RADICAL PROSTATECTOMY N/A 09/12/2012   Procedure: ROBOTIC ASSISTED LAPAROSCOPIC RADICAL PROSTATECTOMY;  Surgeon: Alexis Frock, MD;  [facility];  Service: Urology;  Laterality: N/A;    There were no vitals filed for this visit.   Subjective Assessment - 07/15/20 1106    Subjective I'm achy but not doing too bad today.    Currently in Pain? Yes    Pain Score 2     Pain Location Back    Pain Orientation Left    Pain Descriptors / Indicators Aching;Tightness    Pain Type Chronic pain    Pain Onset More than a month ago    Pain Frequency Intermittent    Aggravating Factors  lifting, activity, bending over    Pain Relieving Factors sitting down, stretching, reduced activity                             [facility] Adult PT Treatment/Exercise - 07/15/20 0001      Lumbar Exercises: Aerobic   UBE (Upper Arm Bike) L 1.5 x 4 min 2 fwd/bwd    Nustep L2  x 10  min PT present to review updates       Lumbar Exercises: Standing   Row Strengthening;Both;20 reps    Row Limitations 20# standing on foam pad    Shoulder Extension Strengthening;Right;Left;20 reps;Theraband    Shoulder Extension Limitations 15# standing on foam pad    Other Standing Lumbar Exercises wall push ups 2x10      Lumbar Exercises: Seated   Other Seated Lumbar Exercises shoulder flexion 2# 2x10    Other Seated Lumbar Exercises press into foam roll: 5" hold  2x10      Knee/Hip Exercises: Standing   Hip Abduction Stengthening;Both;2 sets;10 reps    Abduction Limitations standing on foam pad    Walking with Sports Cord reverse walking 25# with abdominal bracing 2x10                    PT Short Term Goals - 06/10/20 1106      PT SHORT TERM GOAL #1   Title Pt will be independent and consistent with his initial HEP to improve posture awareness and decrease pain.    Status Achieved             PT Long Term Goals - 07/01/20 1108      PT LONG TERM  GOAL #1   Title Pt will be independent with safe, self progressive HEP upright posture while standing/walking and improve/maintain spinal curvature    Baseline Patient has been told by others his posture is better.    Status Partially Met      PT LONG TERM GOAL #2   Title The patient will demonstrate good body mechanics with bending/stooping to pick up sticks in the yard    Status Partially Met      PT LONG TERM GOAL #3   Title The patient will report a 50-65% improvement in low back pain with ADLs including walking his dog    Baseline 65% improvement    Status Achieved      PT LONG TERM GOAL #4   Title Pt will have improved trunk strength to 4-/5 to 4/5 evident by his ability to maintain a more upright posture    Status On-going      PT LONG TERM GOAL #5   Title FOTO functional outcome improved from 39% limitation to 33%    Baseline 30% limited    Status Achieved                 Plan - 07/15/20 1114    Clinical Impression Statement  Patient is progressing well with long term goals. He reports 70% improvement overall and reports that people are telling him that is his posture is better and he is standing up straighter.  Pt is making postural corrections at home and is able to maintain his upright posture when he walks his dog each day. He still demonstrates some postural weakness in the clinic although this endurance is improved as evidence by reduced need for tactile and verbal cues.  Pt will continue to benefit from skilled PT to improve strength, postural endurance and flexibility.    Rehab Potential Good    PT Frequency 1x / week    PT Duration 4 weeks    PT Treatment/Interventions ADLs/Self Care Home Management;Aquatic Therapy;Cryotherapy;Electrical Stimulation;Moist Heat;Functional mobility training;Neuromuscular re-education;Balance training;Therapeutic exercise;Therapeutic activities;Patient/family education;Manual techniques;Dry needling;Taping    PT Next Visit Plan continue postural strength, endurance, flexibility    PT Home Exercise Plan [id number] from previous course of PT    Consulted and Agree with Plan of Care Patient           Patient will benefit from skilled therapeutic intervention in order to improve the following deficits and impairments:  Improper body mechanics,Pain,Postural dysfunction,Increased muscle spasms,Decreased mobility,Decreased activity tolerance,Decreased range of motion,Decreased strength,Hypomobility,Impaired flexibility,Decreased balance  Visit Diagnosis: Chronic left-sided low back pain without sciatica  Abnormal posture  Muscle spasm of back     Problem List Patient Active Problem List   Diagnosis Date Noted  . Degenerative lumbar spinal stenosis 08/03/2019  . 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 Low back pain 10/30/2017  . Posterior tibial tendon dysfunction (PTTD) of right lower extremity 02/24/2017     [name], PT 07/15/20 11:49 AM  [facility] [address], [place], [place], [address] Phone: [phone]   Fax:  [phone]  Name: Melvin [name] MRN: [id number] Date of Birth: 14-Sep-1945

## 2020-07-22 ENCOUNTER — Other Ambulatory Visit: Payer: Self-pay

## 2020-07-22 ENCOUNTER — Ambulatory Visit: Payer: BC Managed Care – PPO

## 2020-07-22 DIAGNOSIS — G8929 Other chronic pain: Secondary | ICD-10-CM

## 2020-07-22 DIAGNOSIS — M545 Low back pain, unspecified: Secondary | ICD-10-CM | POA: Diagnosis not present

## 2020-07-22 DIAGNOSIS — M62830 Muscle spasm of back: Secondary | ICD-10-CM

## 2020-07-22 DIAGNOSIS — R293 Abnormal posture: Secondary | ICD-10-CM

## 2020-07-22 NOTE — Therapy (Signed)
[facility] [address] [place], [place], [address] Phone: [phone]   Fax:  [phone]  Physical Therapy Treatment  Patient Details  Name: Melvin [name] MRN: [id number] Date of Birth: [birth year] Referring Provider (PT): Hulan Saas, DO   Encounter Date: 07/22/2020   PT End of Session - 07/22/20 1137    Visit Number 15    Date for PT Re-Evaluation 07/29/20    Authorization Type [employer]State health    PT Start Time 1101    PT Stop Time 1144    PT Time Calculation (min) 43 min    Activity Tolerance Patient tolerated treatment well    Behavior During Therapy [facility] for tasks assessed/performed           Past Medical History:  Diagnosis Date  . Cancer [facility])     Past Surgical History:  Procedure Laterality Date  . LYMPHADENECTOMY Bilateral 09/12/2012   Procedure: LYMPHADENECTOMY;  Surgeon: Alexis Frock, MD;  Location: [facility];  Service: Urology;  Laterality: Bilateral;  . ROBOT ASSISTED LAPAROSCOPIC RADICAL PROSTATECTOMY N/A 09/12/2012   Procedure: ROBOTIC ASSISTED LAPAROSCOPIC RADICAL PROSTATECTOMY;  Surgeon: Alexis Frock, MD;  Location: [facility];  Service: Urology;  Laterality: N/A;    There were no vitals filed for this visit.   Subjective Assessment - 07/22/20 1104    Subjective I'm doing ok.  The past week has been good.    Currently in Pain? No/denies                             [facility] Adult PT Treatment/Exercise - 07/22/20 0001      Lumbar Exercises: Aerobic   UBE (Upper Arm Bike) L 1.5 x 6 min (3/3)    Nustep L2  x 10  min PT present to review updates      Lumbar Exercises: Standing   Row Strengthening;Both;20 reps    Row Limitations 20# standing foam pad    Shoulder Extension Strengthening;Right;Left;20 reps;Theraband    Shoulder Extension Limitations 15# standing on foam pad    Other Standing Lumbar Exercises wall push ups 2x10      Lumbar Exercises: Seated   Other Seated  Lumbar Exercises shoulder flexion 2# 2x10    Other Seated Lumbar Exercises press into foam roll: 5" hold  2x10      Knee/Hip Exercises: Standing   Walking with Sports Cord reverse walking 25# with abdominal bracing 2x10      Knee/Hip Exercises: Seated   Sit to Sand without UE support;20 reps   holding 10# kettlebell                   PT Short Term Goals - 06/10/20 1106      PT SHORT TERM GOAL #1   Title Pt will be independent and consistent with his initial HEP to improve posture awareness and decrease pain.    Status Achieved             PT Long Term Goals - 07/01/20 1108      PT LONG TERM GOAL #1   Title Pt will be independent with safe, self progressive HEP upright posture while standing/walking and improve/maintain spinal curvature    Baseline Patient has been told by others his posture is better.    Status Partially Met      PT LONG TERM GOAL #2   Title The patient will demonstrate good body mechanics with bending/stooping to pick up  sticks in the yard    Status Partially Met      PT LONG TERM GOAL #3   Title The patient will report a 50-65% improvement in low back pain with ADLs including walking his dog    Baseline 65% improvement    Status Achieved      PT LONG TERM GOAL #4   Title Pt will have improved trunk strength to 4-/5 to 4/5 evident by his ability to maintain a more upright posture    Status On-going      PT LONG TERM GOAL #5   Title FOTO functional outcome improved from 39% limitation to 33%    Baseline 30% limited    Status Achieved                 Plan - 07/22/20 1133    Clinical Impression Statement Patient is progressing well with long term goals. Pt arrived without pain and reports that pain has not been bad at all this week.  Pt still demonstrates some postural weakness in the clinic although this endurance is improved as evidence by reduced need for tactile and verbal cues.  Pt will continue to benefit from skilled PT to  improve strength, postural endurance and flexibility.    PT Frequency 1x / week    PT Duration 4 weeks    PT Treatment/Interventions ADLs/Self Care Home Management;Aquatic Therapy;Cryotherapy;Electrical Stimulation;Moist Heat;Functional mobility training;Neuromuscular re-education;Balance training;Therapeutic exercise;Therapeutic activities;Patient/family education;Manual techniques;Dry needling;Taping    PT Next Visit Plan 1 more session- FOTO, finalize HEP    PT Home Exercise Plan [facility] from previous course of PT    Recommended Other Services 07/01/20           Patient will benefit from skilled therapeutic intervention in order to improve the following deficits and impairments:  Improper body mechanics,Pain,Postural dysfunction,Increased muscle spasms,Decreased mobility,Decreased activity tolerance,Decreased range of motion,Decreased strength,Hypomobility,Impaired flexibility,Decreased balance  Visit Diagnosis: Abnormal posture  Chronic left-sided low back pain without sciatica  Muscle spasm of back     Problem List Patient Active Problem List   Diagnosis Date Noted  . Degenerative lumbar spinal stenosis 08/03/2019  . 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 Low back pain 10/30/2017  . Posterior tibial tendon dysfunction (PTTD) of right lower extremity 02/24/2017     [name], PT 07/22/20 11:39 AM  [facility] [address] [place], [place], [address] Phone: [phone]   Fax:  [phone]  Name: Melvin [name] MRN: [id number] Date of Birth: 1945-12-10

## 2020-07-29 ENCOUNTER — Ambulatory Visit: Payer: BC Managed Care – PPO

## 2020-07-29 ENCOUNTER — Other Ambulatory Visit: Payer: Self-pay

## 2020-07-29 DIAGNOSIS — M62830 Muscle spasm of back: Secondary | ICD-10-CM

## 2020-07-29 DIAGNOSIS — M545 Low back pain, unspecified: Secondary | ICD-10-CM | POA: Diagnosis not present

## 2020-07-29 DIAGNOSIS — R293 Abnormal posture: Secondary | ICD-10-CM

## 2020-07-29 DIAGNOSIS — G8929 Other chronic pain: Secondary | ICD-10-CM

## 2020-07-29 NOTE — Therapy (Signed)
[facility] [address], [place], [place], [address] Phone: [phone]   Fax:  [phone]  Physical Therapy Treatment  Patient Details  Name: Melvin [name] MRN: [id number] Date of Birth: 1946-02-13 Referring Provider (PT): Hulan Saas, DO   Encounter Date: 07/29/2020   PT End of Session - 07/29/20 1140    Visit Number 16    PT Start Time 1101    PT Stop Time 7782    PT Time Calculation (min) 47 min    Activity Tolerance Patient tolerated treatment well    Behavior During Therapy [facility] for tasks assessed/performed           Past Medical History:  Diagnosis Date  . Cancer [facility])     Past Surgical History:  Procedure Laterality Date  . LYMPHADENECTOMY Bilateral 09/12/2012   Procedure: LYMPHADENECTOMY;  Surgeon: Alexis Frock, MD;  [facility];  Service: Urology;  Laterality: Bilateral;  . ROBOT ASSISTED LAPAROSCOPIC RADICAL PROSTATECTOMY N/A 09/12/2012   Procedure: ROBOTIC ASSISTED LAPAROSCOPIC RADICAL PROSTATECTOMY;  Surgeon: Alexis Frock, MD;  Location: [facility];  Service: Urology;  Laterality: N/A;    There were no vitals filed for this visit.   Subjective Assessment - 07/29/20 1104    Subjective Ready to D/C to HEP.  I'm stiff and it takes me a little bit to loosen up in the morning.    Currently in Pain? No/denies              [facility] PT Assessment - 07/29/20 0001      Assessment   Medical Diagnosis Degenerative changes lumbar spine      Prior Function   Level of Independence Independent      Cognition   Overall Cognitive Status Within Functional Limits for tasks assessed      Observation/Other Assessments   Focus on Therapeutic Outcomes (FOTO)  28% limitation      Posture/Postural Control   Posture/Postural Control Postural limitations    Posture Comments slouched sitting posture; excessive kyphosis in addition to right scoliosis       Ambulation/Gait   Ambulation/Gait Yes                          [facility] Adult PT Treatment/Exercise - 07/29/20 0001      Lumbar Exercises: Aerobic   UBE (Upper Arm Bike) L 1.5 x 6 min (3/3)    Nustep L2  x 10  min PT present to review updates      Lumbar Exercises: Standing   Row Strengthening;Both;20 reps    Row Limitations 20# standing on foam pad    Shoulder Extension Strengthening;Right;Left;20 reps;Theraband    Shoulder Extension Limitations 15# standing on foam pad    Other Standing Lumbar Exercises wall push ups 2x10      Lumbar Exercises: Seated   Other Seated Lumbar Exercises shoulder flexion 2# 2x10    Other Seated Lumbar Exercises press into foam roll: 5" hold  2x10      Knee/Hip Exercises: Standing   Walking with Sports Cord reverse walking 25# with abdominal bracing 2x10      Knee/Hip Exercises: Seated   Sit to Sand without UE support;20 reps   holding 10# kettlebell                   PT Short Term Goals - 06/10/20 1106      PT SHORT TERM GOAL #1   Title Pt will be independent  and consistent with his initial HEP to improve posture awareness and decrease pain.    Status Achieved             PT Long Term Goals - 07/29/20 1111      PT LONG TERM GOAL #1   Title Pt will be independent with safe, self progressive HEP upright posture while standing/walking and improve/maintain spinal curvature    Baseline Patient has been told by others his posture is better.    Status Achieved      PT LONG TERM GOAL #2   Title The patient will demonstrate good body mechanics with bending/stooping to pick up sticks in the yard    Status Achieved      PT LONG TERM GOAL #3   Title The patient will report a 50-65% improvement in low back pain with ADLs including walking his dog    Baseline 75%    Status Achieved      PT LONG TERM GOAL #4   Title Pt will have improved trunk strength to 4-/5 to 4/5 evident by his ability to maintain a more upright posture    Status Partially Met      PT LONG TERM  GOAL #5   Title FOTO functional outcome improved from 39% limitation to 33%    Baseline 28% limitation    Status Achieved                 Plan - 07/29/20 1117    Clinical Impression Statement Pt is ready for D/C to HEP.  Pt is independent and compliant in HEP and reports 75% overall pain reduction since the start of care.  Pt has the most discomfort in the early morning hours and in the evening hours.  Pt rates pain as 3/10 max in the low back.   Pt still demonstrates some postural weakness in the clinic although this endurance is improved as evidence by reduced need for tactile and verbal cues.  Pt is utilizing body mechanics modifications with home tasks for lumbar protection.  Pt will D/C to HEP today.    PT Next Visit Plan D/C PT to HEP    PT Home Exercise Plan [facility] from previous course of PT    Recommended Other Services recert is signed    Consulted and Agree with Plan of Care Patient           Patient will benefit from skilled therapeutic intervention in order to improve the following deficits and impairments:     Visit Diagnosis: Abnormal posture  Chronic left-sided low back pain without sciatica  Muscle spasm of back     Problem List Patient Active Problem List   Diagnosis Date Noted  . Degenerative lumbar spinal stenosis 08/03/2019  . 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 Low back pain 10/30/2017  . Posterior tibial tendon dysfunction (PTTD) of right lower extremity 02/24/2017    PHYSICAL THERAPY DISCHARGE SUMMARY  Visits from Start of Care: 16  Current functional level related to goals / functional outcomes: See above for current status.     Remaining deficits: Functional postural deficits.  Pt has HEP in place to address strength and endurance.     Education / Equipment: HEP, posture/body mechanics  Plan: Patient agrees to discharge.  Patient goals were partially met. Patient is being discharged due to being pleased with the current functional level.   ?????         [name], PT 07/29/20 11:41 AM  [facility] [address], [place], [place], [address] Phone: [phone]   Fax:  [phone]  Name: Melvin [name] MRN: [id number] Date of Birth: Apr 29, 1946

## 2020-09-09 ENCOUNTER — Ambulatory Visit: Payer: BC Managed Care – PPO | Admitting: Family Medicine

## 2020-09-22 NOTE — Progress Notes (Signed)
[facility] [address] [place] Phone: [phone] Subjective:   I Melvin [name] am serving as a Education administrator for Dr. Hulan Saas.  This visit occurred during the SARS-CoV-2 public health emergency.  Safety protocols were in place, including screening questions prior to the visit, additional usage of staff PPE, and extensive cleaning of exam room while observing appropriate contact time as indicated for disinfecting solutions.   I'm seeing this patient by the request  of:  Orpah Melter, MD  CC: Low back pain follow-up  FMB:[id number]   06/10/2020 Continues to do very well at this time.  We discussed with patient in great length.  Patient has been taking minimal medications.  Patient is seen other providers.  Does not have any history of the prostate and we discussed the potential for the MRI again.  Patient states that he is not having significant pain and would not likely want the epidurals.  He will continue with formal physical therapy.  I dressed all questions and he will send more my chart questions if necessary.  Patient is accompanied with wife who did ask good questions as well.  Follow-up with me again in 2 to 3 months.  Total time reviewing with patient 30 minutes  Update 09/23/2020 Melvin [name] is a 75 y.o. male coming in with complaint of low back pain.  Known significant degenerative scoliosis noted.  Patient has been doing formal physical therapy.  Patient states the pain is still there and is sometimes worse on some days. States he has some improvement and is better than he was. Sometimes in the morning he is stiff.     Patient's x-rays in April 2021 showed the patient does have significant degenerative disc disease and a significant amount of lumbar scoliosis noted.  Seems to be most advanced at L2-L4  Past Medical History:  Diagnosis Date  . Cancer [facility])    Past Surgical History:  Procedure Laterality Date  . LYMPHADENECTOMY  Bilateral 09/12/2012   Procedure: LYMPHADENECTOMY;  Surgeon: Alexis Frock, MD;  [facility]: [facility];  Service: Urology;  Laterality: Bilateral;  . ROBOT ASSISTED LAPAROSCOPIC RADICAL PROSTATECTOMY N/A 09/12/2012   Procedure: ROBOTIC ASSISTED LAPAROSCOPIC RADICAL PROSTATECTOMY;  Surgeon: Alexis Frock, MD;  Location: [facility];  Service: Urology;  Laterality: N/A;   Social History   Socioeconomic History  . Marital status: Divorced    Spouse name: Not on file  . Number of children: Not on file  . Years of education: Not on file  . Highest education level: Not on file  Occupational History  . Not on file  Tobacco Use  . Smoking status: Former Smoker    Quit date: 09/12/1992    Years since quitting: 28.0  . Smokeless tobacco: Never Used  Substance and Sexual Activity  . Alcohol use: Not on file  . Drug use: Not on file  . Sexual activity: Not on file  Other Topics Concern  . Not on file  Social History Narrative  . Not on file   Social Determinants of Health   Financial Resource Strain: Not on file  Food Insecurity: Not on file  Transportation Needs: Not on file  Physical Activity: Not on file  Stress: Not on file  Social Connections: Not on file   Allergies  Allergen Reactions  . Sulfa Antibiotics Hives   No family history on file.  Current Outpatient Medications (Endocrine & Metabolic):  .  predniSONE (DELTASONE) 20 MG tablet, Take 1 tablet (20  mg total) by mouth daily with breakfast. .  predniSONE (STERAPRED UNI-PAK 21 TAB) 10 MG (21) TBPK tablet, Take as directed   Current Outpatient Medications (Respiratory):  .  mometasone (NASONEX) 50 MCG/ACT nasal spray, Place 2 sprays into the nose daily as needed (for allergies).  Current Outpatient Medications (Analgesics):  .  diclofenac (VOLTAREN) 75 MG EC tablet, TAKE 1 TABLET BY MOUTH TWICE DAILY. Marland Kitchen  HYDROcodone-acetaminophen (NORCO) 5-325 MG per tablet, Take 1-2 tablets by mouth every 6 (six) hours as needed for  pain.   Current Outpatient Medications (Other):  .  ciprofloxacin (CIPRO) 500 MG tablet, Take 1 tablet (500 mg total) by mouth 2 (two) times daily. Start day prior to office visit for foley removal .  diclofenac sodium (VOLTAREN) 1 % GEL, Apply 2 g topically 4 (four) times daily. .  DULoxetine (CYMBALTA) 20 MG capsule, Take 1 capsule (20 mg total) by mouth daily. .  fluocinonide cream (LIDEX) 8.67 %, Apply 1 application topically 2 (two) times daily as needed. For skin rash .  latanoprost (XALATAN) 0.005 % ophthalmic solution, Place 1 drop into both eyes at bedtime. Marland Kitchen  oxybutynin (DITROPAN) 5 MG tablet, Take 1 tablet (5 mg total) by mouth every 8 (eight) hours as needed. For bladder spasms while catheter in place .  ranitidine (ZANTAC) 150 MG tablet, Take 150 mg by mouth 2 (two) times daily. .  sennosides-docusate sodium (SENOKOT-S) 8.6-50 MG tablet, Take 1 tablet by mouth 2 (two) times daily. While taking pain meds to prevent constipation .  Vitamin D, Ergocalciferol, (DRISDOL) 1.25 MG (50000 UNIT) CAPS capsule, Take 1 capsule (50,000 Units total) by mouth every 7 (seven) days.   Reviewed prior external information including notes and imaging from  primary care provider As well as notes that were available from care everywhere and other healthcare systems.  Past medical history, social, surgical and family history all reviewed in electronic medical record.  No pertanent information unless stated regarding to the chief complaint.   Review of Systems:  No headache, visual changes, nausea, vomiting, diarrhea, constipation, dizziness, abdominal pain, skin rash, fevers, chills, night sweats, weight loss, swollen lymph nodes,, joint swelling, chest pain, shortness of breath, mood changes. POSITIVE muscle aches, body aches  Objective  Blood pressure (!) 130/92, pulse 62, height 6\' 2"  (1.88 m), weight 178 lb (80.7 kg), SpO2 99 %.   General: No apparent distress alert and oriented x3 mood and  affect normal, dressed appropriately.  HEENT: Pupils equal, extraocular movements intact  Respiratory: Patient's speak in full sentences and does not appear short of breath  Cardiovascular: No lower extremity edema, non tender, no erythema  Gait antalgic favoring the right leg but the left back Patient does have significant scoliosis of the lumbar and even the thoracic spine noted. Patient does have some tenderness to palpation on the left side of the paraspinal musculature of the lumbar spine. Foot exam shows the patient does have significant pes planus with overpronation of the hindfoot bilaterally.  Abdominal exam shows the patient does with Valsalva potentially have inguinal hernia noted.   Impression and Recommendations:     The above documentation has been reviewed and is accurate and complete Lyndal Pulley, DO

## 2020-09-23 ENCOUNTER — Other Ambulatory Visit: Payer: Self-pay

## 2020-09-23 ENCOUNTER — Ambulatory Visit: Payer: BC Managed Care – PPO | Admitting: Family Medicine

## 2020-09-23 ENCOUNTER — Encounter: Payer: Self-pay | Admitting: Family Medicine

## 2020-09-23 VITALS — BP 130/92 | HR 62 | Ht 74.0 in | Wt 178.0 lb

## 2020-09-23 DIAGNOSIS — K4090 Unilateral inguinal hernia, without obstruction or gangrene, not specified as recurrent: Secondary | ICD-10-CM | POA: Diagnosis not present

## 2020-09-23 DIAGNOSIS — M48061 Spinal stenosis, lumbar region without neurogenic claudication: Secondary | ICD-10-CM

## 2020-09-23 NOTE — Assessment & Plan Note (Addendum)
Patient has what appears to be a potential inguinal hernia noted.  Patient states that it is intermittent.  Every time he lays down that seems to go back in.  Will refer to general surgery to discuss further.  Patient knows of any type of worsening tenderness or pain, nausea or vomiting to seek medical attention immediately.

## 2020-09-23 NOTE — Patient Instructions (Addendum)
Good to see you MRI [phone] Referral to general surgery for hernia Handicap placard See me again in 2-3 months we will talk after MRI

## 2020-09-23 NOTE — Assessment & Plan Note (Signed)
Significant overall.  Discussed the potential for the MRI at this point which patient thinks he would like to do.  Depending on findings we can consider the possibility of different epidurals to help with pain.  The patient has done all other conservative therapy including multiple months of formal physical therapy and patient has made some improvement but continues to have pain on a daily basis.  Depending on findings we will discuss further management afterwards.  Patient of course would like to avoid surgical intervention if possible.

## 2020-11-12 ENCOUNTER — Ambulatory Visit: Payer: Self-pay | Admitting: General Surgery

## 2020-11-12 NOTE — H&P (Signed)
Chief Complaint: Hernia       History of Present Illness: Melvin [name] is a 75 y.o. male who is seen today as an office consultation at the request of Dr. Tamala Julian and Doyle Askew for evaluation of Hernia Patient is a 75 year old male, who comes in secondary to a left inguinal hernia. Patient states has been there for several months.  He states that he has no pain however notices a large bulge in the left inguinal area.  He states that he does do some stretching exercises to help with the scoliosis.  Patient's had no signs or symptoms of incarceration or strangulation.   Patient's had a previous robotic prostatectomy by Dr. Tresa Moore in May 2014.     Review of Systems: A complete review of systems was obtained from the patient.  I have reviewed this information and discussed as appropriate with the patient.  See HPI as well for other ROS.   Review of Systems  Constitutional: Negative for fever.  HENT: Negative for congestion.   Eyes: Negative for blurred vision.  Respiratory: Negative for cough, shortness of breath and wheezing.   Cardiovascular: Negative for chest pain and palpitations.  Gastrointestinal: Negative for heartburn.  Genitourinary: Negative for dysuria.  Musculoskeletal: Negative for myalgias.  Skin: Negative for rash.  Neurological: Negative for dizziness and headaches.  Psychiatric/Behavioral: Negative for depression and suicidal ideas.  All other systems reviewed and are negative.       Medical History: Past Medical History Past Medical History: Diagnosis Date  GERD (gastroesophageal reflux disease)    Glaucoma (increased eye pressure)    History of cancer        There is no problem list on file for this patient.     Past Surgical History Past Surgical History: Procedure Laterality Date  LENS EYE SURGERY      PROSTATE SURGERY          Allergies Allergies Allergen Reactions  Sulfa (Sulfonamide Antibiotics) Shortness Of Breath      Current Outpatient  Medications on File Prior to Visit Medication Sig Dispense Refill  diclofenac (VOLTAREN) 75 MG EC tablet TAKE 1 TABLET BY MOUTH TWICE DAILY.      dorzolamide-timolol (COSOPT PF) 2-0.5 % ophthalmic solution Place 1 drop into both eyes 2 (two) times daily 60 each 11  dorzolamide-timoloL (COSOPT) 22.3-6.8 mg/mL ophthalmic solution Place 1 drop into both eyes 2 (two) times daily (Patient not taking: No sig reported)      DULoxetine (CYMBALTA) 20 MG DR capsule Take 1 capsule by mouth once daily      ergocalciferol, vitamin D2, 1,250 mcg (50,000 unit) capsule Take 50,000 Units by mouth every 7 (seven) days      fluocinonide (LIDEX) 0.05 % cream Apply topically      latanoprost (XALATAN) 0.005 % ophthalmic solution Apply to eye (Patient not taking: No sig reported)      mometasone (NASONEX) 50 mcg/actuation nasal spray Place into one nostril      plasma rich growth factor ophthalmic solution Place 1 drop into both eyes 4 (four) times daily   6  predniSONE (DELTASONE) 20 MG tablet Take 20 mg by mouth daily with breakfast (Patient not taking: No sig reported)      ranitidine (ZANTAC) 150 MG tablet Take 150 mg by mouth 2 (two) times daily      tafluprost, PF, (ZIOPTAN PF) 0.0015 % opthalmic solution Place 1 drop into both eyes nightly 30 Blister Pack 12   No current facility-administered medications on file  prior to visit.     Family History Family History Problem Relation Age of Onset  Skin cancer Mother    Skin cancer Father    Glaucoma Father    Glaucoma Paternal Grandfather        Social History   Tobacco Use Smoking Status Former Smoker  Types: Cigarettes  Quit date: 05/02/1988  Years since quitting: 32.5 Smokeless Tobacco Never Used     Social History Social History    Socioeconomic History  Marital status: Married Tobacco Use  Smoking status: Former Smoker     Types: Cigarettes     Quit date: 05/02/1988     Years since quitting: 32.5  Smokeless tobacco: Never Used Substance  and Sexual Activity  Alcohol use: Not Currently  Drug use: Never  Sexual activity: Defer      Objective:     Vitals:   11/12/20 1401 Pulse: 79 Temp: 36.4 C (97.6 F) SpO2: 98% Weight: 81.6 kg (179 lb 12.8 oz)   There is no height or weight on file to calculate BMI.   Physical Exam Constitutional:      Appearance: Normal appearance.  HENT:     Head: Normocephalic and atraumatic.     Nose: Nose normal. No congestion.     Mouth/Throat:     Mouth: Mucous membranes are moist.     Pharynx: Oropharynx is clear.  Eyes:     Pupils: Pupils are equal, round, and reactive to light.  Cardiovascular:     Rate and Rhythm: Normal rate and regular rhythm.     Pulses: Normal pulses.     Heart sounds: Normal heart sounds. No murmur heard.   No friction rub. No gallop.  Pulmonary:     Effort: Pulmonary effort is normal. No respiratory distress.     Breath sounds: Normal breath sounds. No stridor. No wheezing, rhonchi or rales.  Abdominal:     General: Abdomen is flat.     Hernia: A hernia is present. Hernia is present in the left inguinal area. There is no hernia in the right inguinal area.  Musculoskeletal:        General: Normal range of motion.     Cervical back: Normal range of motion.  Skin:    General: Skin is warm and dry.  Neurological:     General: No focal deficit present.     Mental Status: He is alert and oriented to person, place, and time.  Psychiatric:        Mood and Affect: Mood normal.        Thought Content: Thought content normal.            Assessment and Plan: Diagnoses and all orders for this visit:   Non-recurrent unilateral inguinal hernia without obstruction or gangrene     Melvin [name] is a 75 y.o. male    1.  We will proceed to the OR for a laparoscopic left inguinal hernia repair with mesh. 2. All risks and benefits were discussed with the patient, to generally include infection, bleeding, damage to surrounding structures, acute and  chronic nerve pain, and recurrence. Alternatives were offered and described.  All questions were answered and the patient voiced understanding of the procedure and wishes to proceed at this point.

## 2020-11-23 DIAGNOSIS — M4150 Other secondary scoliosis, site unspecified: Secondary | ICD-10-CM | POA: Insufficient documentation

## 2020-12-02 ENCOUNTER — Ambulatory Visit: Payer: BC Managed Care – PPO | Admitting: Family Medicine

## 2021-01-06 ENCOUNTER — Ambulatory Visit: Payer: Self-pay | Admitting: General Surgery

## 2021-01-06 NOTE — H&P (Signed)
vChief Complaint: Hernia       History of Present Illness: Patient follows back up today secondary to a left-sided hernia that was found by Dr. Tresa Moore. Patient states he has no pain or discomfort to the area.   Patient was interested in having this both fixed concurrently.   ----------------- Melvin [name] is a 75 y.o. male who is seen today as an office consultation at the request of Dr. Tamala Julian and Doyle Askew for evaluation of Hernia Patient is a 75 year old male, who comes in secondary to a left inguinal hernia. Patient states has been there for several months.  He states that he has no pain however notices a large bulge in the left inguinal area.  He states that he does do some stretching exercises to help with the scoliosis.  Patient's had no signs or symptoms of incarceration or strangulation.   Patient's had a previous robotic prostatectomy by Dr. Tresa Moore in May 2014.     Review of Systems: A complete review of systems was obtained from the patient.  I have reviewed this information and discussed as appropriate with the patient.  See HPI as well for other ROS.   Review of Systems  Constitutional: Negative for fever.  HENT: Negative for congestion.   Eyes: Negative for blurred vision.  Respiratory: Negative for cough, shortness of breath and wheezing.   Cardiovascular: Negative for chest pain and palpitations.  Gastrointestinal: Negative for heartburn.  Genitourinary: Negative for dysuria.  Musculoskeletal: Negative for myalgias.  Skin: Negative for rash.  Neurological: Negative for dizziness and headaches.  Psychiatric/Behavioral: Negative for depression and suicidal ideas.  All other systems reviewed and are negative.       Medical History: Past Medical History       Past Medical History: Diagnosis Date  GERD (gastroesophageal reflux disease)    Glaucoma (increased eye pressure)    History of cancer        There is no problem list on file for this patient.      Past Surgical History     Past Surgical History: Procedure Laterality Date  LENS EYE SURGERY      PROSTATE SURGERY          Allergies       Allergies Allergen Reactions  Sulfa (Sulfonamide Antibiotics) Shortness Of Breath                Current Outpatient Medications on File Prior to Visit Medication Sig Dispense Refill  diclofenac (VOLTAREN) 75 MG EC tablet TAKE 1 TABLET BY MOUTH TWICE DAILY.      dorzolamide-timolol (COSOPT PF) 2-0.5 % ophthalmic solution Place 1 drop into both eyes 2 (two) times daily 60 each 11  dorzolamide-timoloL (COSOPT) 22.3-6.8 mg/mL ophthalmic solution Place 1 drop into both eyes 2 (two) times daily (Patient not taking: No sig reported)      DULoxetine (CYMBALTA) 20 MG DR capsule Take 1 capsule by mouth once daily      ergocalciferol, vitamin D2, 1,250 mcg (50,000 unit) capsule Take 50,000 Units by mouth every 7 (seven) days      fluocinonide (LIDEX) 0.05 % cream Apply topically      latanoprost (XALATAN) 0.005 % ophthalmic solution Apply to eye (Patient not taking: No sig reported)      mometasone (NASONEX) 50 mcg/actuation nasal spray Place into one nostril      plasma rich growth factor ophthalmic solution Place 1 drop into both eyes 4 (four) times daily   6  predniSONE (DELTASONE) 20  MG tablet Take 20 mg by mouth daily with breakfast (Patient not taking: No sig reported)      ranitidine (ZANTAC) 150 MG tablet Take 150 mg by mouth 2 (two) times daily      tafluprost, PF, (ZIOPTAN PF) 0.0015 % opthalmic solution Place 1 drop into both eyes nightly 30 Blister Pack 12   No current facility-administered medications on file prior to visit.     Family History         Family History Problem Relation Age of Onset  Skin cancer Mother    Skin cancer Father    Glaucoma Father    Glaucoma Paternal Grandfather        Social History         Tobacco Use Smoking Status Former Smoker  Types: Cigarettes  Quit date: 05/02/1988  Years since  quitting: 32.5 Smokeless Tobacco Never Used     Social History Social History            Socioeconomic History  Marital status: Married Tobacco Use  Smoking status: Former Smoker     Types: Cigarettes     Quit date: 05/02/1988     Years since quitting: 32.5  Smokeless tobacco: Never Used Substance and Sexual Activity  Alcohol use: Not Currently  Drug use: Never  Sexual activity: Defer      Objective:         Vitals:   11/12/20 1401 Pulse: 79 Temp: 36.4 C (97.6 F) SpO2: 98% Weight: 81.6 kg (179 lb 12.8 oz)   There is no height or weight on file to calculate BMI.   Physical Exam Constitutional:      Appearance: Normal appearance.  HENT:     Head: Normocephalic and atraumatic.     Nose: Nose normal. No congestion.     Mouth/Throat:     Mouth: Mucous membranes are moist.     Pharynx: Oropharynx is clear.  Eyes:     Pupils: Pupils are equal, round, and reactive to light.  Cardiovascular:     Rate and Rhythm: Normal rate and regular rhythm.     Pulses: Normal pulses.     Heart sounds: Normal heart sounds. No murmur heard.   No friction rub. No gallop.  Pulmonary:     Effort: Pulmonary effort is normal. No respiratory distress.     Breath sounds: Normal breath sounds. No stridor. No wheezing, rhonchi or rales.  Abdominal:     General: Abdomen is flat.     Hernia: A hernia is present. Hernia is present in the bilateral inguinal area.  Musculoskeletal:        General: Normal range of motion.     Cervical back: Normal range of motion.  Skin:    General: Skin is warm and dry.  Neurological:     General: No focal deficit present.     Mental Status: He is alert and oriented to person, place, and time.  Psychiatric:        Mood and Affect: Mood normal.        Thought Content: Thought content normal.            Assessment and Plan: Diagnoses and all orders for this visit:   Non-recurrent bilateral inguinal hernia without obstruction or gangrene      Melvin [name] is a 75 y.o. male    1.  We will proceed to the OR for a laparoscopic bilateral inguinal hernia repair with mesh. 2. All risks and benefits were discussed  with the patient, to generally include infection, bleeding, damage to surrounding structures, acute and chronic nerve pain, and recurrence. Alternatives were offered and described.  All questions were answered and the patient voiced understanding of the procedure and wishes to proceed at this point.             No follow-ups on file.   Ralene Ok, MD, [facility], [facility]

## 2021-01-13 ENCOUNTER — Ambulatory Visit: Payer: BC Managed Care – PPO | Admitting: Family Medicine

## 2021-01-19 DIAGNOSIS — M5136 Other intervertebral disc degeneration, lumbar region: Secondary | ICD-10-CM | POA: Insufficient documentation

## 2021-01-26 NOTE — Progress Notes (Signed)
[address] [place] Phone: [phone] Subjective:   Melvin [name], am serving as a scribe for Dr. Hulan Saas. This visit occurred during the SARS-CoV-2 public health emergency.  Safety protocols were in place, including screening questions prior to the visit, additional usage of staff PPE, and extensive cleaning of exam room while observing appropriate contact time as indicated for disinfecting solutions.   I'm seeing this patient by the request  of:  Orpah Melter, MD  CC: Low back pain follow-up  IRW:[id number]  09/23/2020 Patient has what appears to be a potential inguinal hernia noted.  Patient states that it is intermittent.  Every time he lays down that seems to go back in.  Will refer to general surgery to discuss further.  Patient knows of any type of worsening tenderness or pain, nausea or vomiting to seek medical attention immediately.  Update 01/27/2021 Melvin [name] is a 75 y.o. male coming in with complaint of back pan and inguinal hernia. Patient has seen general surgery. Will have repiar later this year. Patient states that he has stiffness in the morning that dissipates throughout the day with movement. Saw Dr. Nelva Bush for his back regarding pain management.  He also brought up the idea of a MRI which we have discussed previously as well.  Patient is wondering if that should be the next step.  Patient states that he does not know if he is worsening but has to continue to do significant amount of activity just to stay where he is.  Patient is to continue to do the exercises, will continue to do all the medications.  Patient has been prescribed over the course of time diclofenac, duloxetine, as well as Norco.  Patient has responded to prednisone burst when needed as well.  Patient has met with surgeons who do state that he is not a surgical candidate.  Wanting to know what else can be done otherwise.      Past Medical  History:  Diagnosis Date   Cancer [facility])    Past Surgical History:  Procedure Laterality Date   LYMPHADENECTOMY Bilateral 09/12/2012   Procedure: LYMPHADENECTOMY;  Surgeon: Alexis Frock, MD;  Location: [facility];  Service: Urology;  Laterality: Bilateral;   ROBOT ASSISTED LAPAROSCOPIC RADICAL PROSTATECTOMY N/A 09/12/2012   Procedure: ROBOTIC ASSISTED LAPAROSCOPIC RADICAL PROSTATECTOMY;  Surgeon: Alexis Frock, MD;  Location: [facility];  Service: Urology;  Laterality: N/A;   Social History   Socioeconomic History   Marital status: Divorced    Spouse name: Not on file   Number of children: Not on file   Years of education: Not on file   Highest education level: Not on file  Occupational History   Not on file  Tobacco Use   Smoking status: Former    Types: Cigarettes    Quit date: 09/12/1992    Years since quitting: 28.3   Smokeless tobacco: Never  Substance and Sexual Activity   Alcohol use: Not on file   Drug use: Not on file   Sexual activity: Not on file  Other Topics Concern   Not on file  Social History Narrative   Not on file   Social Determinants of Health   Financial Resource Strain: Not on file  Food Insecurity: Not on file  Transportation Needs: Not on file  Physical Activity: Not on file  Stress: Not on file  Social Connections: Not on file   Allergies  Allergen Reactions   Sulfa Antibiotics  Hives   [name] family history on file.  Current Outpatient Medications (Endocrine & Metabolic):    predniSONE (DELTASONE) 20 MG tablet, Take 1 tablet (20 mg total) by mouth daily with breakfast.   predniSONE (STERAPRED UNI-PAK 21 TAB) 10 MG (21) TBPK tablet, Take as directed   Current Outpatient Medications (Respiratory):    mometasone (NASONEX) 50 MCG/ACT nasal spray, Place 2 sprays into the nose daily as needed (for allergies).  Current Outpatient Medications (Analgesics):    diclofenac (VOLTAREN) 75 MG EC tablet, TAKE 1 TABLET BY MOUTH TWICE DAILY.    HYDROcodone-acetaminophen (NORCO) 5-325 MG per tablet, Take 1-2 tablets by mouth every 6 (six) hours as needed for pain.   Current Outpatient Medications (Other):    ciprofloxacin (CIPRO) 500 MG tablet, Take 1 tablet (500 mg total) by mouth 2 (two) times daily. Start day prior to office visit for foley removal   diclofenac sodium (VOLTAREN) 1 % GEL, Apply 2 g topically 4 (four) times daily.   DULoxetine (CYMBALTA) 20 MG capsule, Take 1 capsule (20 mg total) by mouth daily.   fluocinonide cream (LIDEX) 6.33 %, Apply 1 application topically 2 (two) times daily as needed. For skin rash   latanoprost (XALATAN) 0.005 % ophthalmic solution, Place 1 drop into both eyes at bedtime.   oxybutynin (DITROPAN) 5 MG tablet, Take 1 tablet (5 mg total) by mouth every 8 (eight) hours as needed. For bladder spasms while catheter in place   ranitidine (ZANTAC) 150 MG tablet, Take 150 mg by mouth 2 (two) times daily.   sennosides-docusate sodium (SENOKOT-S) 8.6-50 MG tablet, Take 1 tablet by mouth 2 (two) times daily. While taking pain meds to prevent constipation   Vitamin D, Ergocalciferol, (DRISDOL) 1.25 MG (50000 UNIT) CAPS capsule, Take 1 capsule (50,000 Units total) by mouth every 7 (seven) days.   Review of Systems:  [name] headache, visual changes, nausea, vomiting, diarrhea, constipation, dizziness, abdominal pain, skin rash, fevers, chills, night sweats, weight loss, swollen lymph nodes, joint swelling, chest pain, shortness of breath, mood changes. POSITIVE muscle aches, body aches  Objective  Blood pressure (!) 138/92, pulse 62, height '6\' 2"'  (1.88 m), weight 178 lb (80.7 kg), SpO2 99 %.   General: [name] apparent distress alert and oriented x3 mood and affect normal, dressed appropriately.  HEENT: Pupils equal, extraocular movements intact  Respiratory: Patient's speak in full sentences and does not appear short of breath  Cardiovascular: [name] lower extremity edema, non tender, [name] erythema  Gait  antalgic Increased kyphosis of the upper thoracic spine and does have significant scoliosis noted to the thoracolumbar area.  Significant tightness noted of the lumbar area.  Only has 5 degrees of extension of the back noted.  Tightness with straight leg test but [name] true radicular symptoms.  Negative FABER test as well.   Impression and Recommendations:    The above documentation has been reviewed and is accurate and complete Lyndal Pulley, DO

## 2021-01-27 ENCOUNTER — Other Ambulatory Visit: Payer: Self-pay

## 2021-01-27 ENCOUNTER — Ambulatory Visit (INDEPENDENT_AMBULATORY_CARE_PROVIDER_SITE_OTHER): Payer: BC Managed Care – PPO

## 2021-01-27 ENCOUNTER — Ambulatory Visit: Payer: BC Managed Care – PPO | Admitting: Family Medicine

## 2021-01-27 VITALS — BP 138/92 | HR 62 | Ht 74.0 in | Wt 178.0 lb

## 2021-01-27 DIAGNOSIS — M545 Low back pain, unspecified: Secondary | ICD-10-CM

## 2021-01-27 DIAGNOSIS — M48061 Spinal stenosis, lumbar region without neurogenic claudication: Secondary | ICD-10-CM

## 2021-01-27 NOTE — Assessment & Plan Note (Addendum)
Discussed with patient again at great length.  Patient has had significant arthritic changes and has been having signs and symptoms consistent with spinal stenosis.  Patient has seen other providers to agree with most of this but also states that most of his pain could be secondary to more of the arthritic changes noted.  I would like to repeat the x-rays and then I do think at this point patient has failed all other conservative therapy doing formal physical therapy multiple times and do feel advanced imaging with an MRI would be beneficial.  We have made changes to footwear and everything is well and he is continues to have the chronic back pain.  Does have a past medical history significant for prostate cancer but most recent PSA has been 0.9.  Continue the other medications.  Follow-up with me after imaging to discuss the possibility of such injections.  Patient has seen Dr. Nelva Bush and would be a good individual if any epidurals or nerve root or facet injections are necessary.  Total time discussed with patient as well as reviewing patient's previous notes as well as imaging greater than 32 minutes

## 2021-01-27 NOTE — Patient Instructions (Signed)
Xray today lumbar complete MRI lumbar [id number] Will write you with results Have appt in 2-3 months

## 2021-02-12 ENCOUNTER — Telehealth: Payer: Self-pay | Admitting: Family Medicine

## 2021-02-12 NOTE — Telephone Encounter (Signed)
I will accept him.

## 2021-02-12 NOTE — Telephone Encounter (Signed)
Pt. Has called and is requesting a PCP change to Dr. Quay Burow. States that his wife is a pt. And he wants to speak with provider about establishing care. Will schedule appointment when I get confirmation from Dr. Quay Burow.   Callback #- [phone]

## 2021-03-02 ENCOUNTER — Ambulatory Visit: Payer: BC Managed Care – PPO | Admitting: Internal Medicine

## 2021-03-21 ENCOUNTER — Encounter: Payer: Self-pay | Admitting: Internal Medicine

## 2021-03-21 DIAGNOSIS — J309 Allergic rhinitis, unspecified: Secondary | ICD-10-CM | POA: Insufficient documentation

## 2021-03-21 NOTE — Patient Instructions (Addendum)
    I will see you next September for your physical, earlier if needed.

## 2021-03-21 NOTE — Progress Notes (Signed)
Subjective:    Patient ID: Melvin [name], male    DOB: 08/18/1945, 75 y.o.   MRN: [id number]  This visit occurred during the SARS-CoV-2 public health emergency.  Safety protocols were in place, including screening questions prior to the visit, additional usage of staff PPE, and extensive cleaning of exam room while observing appropriate contact time as indicated for disinfecting solutions.     HPI He is here to establish with a new PCP. The patient is here for follow up of their chronic medical problems, including chronic back pain, constipation, chronic diet issues, allergic rhinitis.  S/p corneal surgery b/l.  He follows at the [facility].  He has bilateral inguinal hernias and he is due to have surgery next week.   He goes to the gym twice a week.    Medications and allergies reviewed with patient and updated if appropriate.  Patient Active Problem List   Diagnosis Date Noted   History of prostate cancer 03/22/2021   Glaucoma 03/22/2021   Constipation 03/22/2021   H/O adenomatous polyp of colon 03/22/2021   GERD (gastroesophageal reflux disease) 03/22/2021   History of SCC (squamous cell carcinoma) of skin 03/22/2021   Allergic rhinitis 03/21/2021   Degenerative scoliosis 11/23/2020   Inguinal hernia 09/23/2020   Degenerative lumbar spinal stenosis 08/03/2019   Ankle arthritis 05/31/2018   Low back pain 10/30/2017   Posterior tibial tendon dysfunction (PTTD) of right lower extremity 02/24/2017    Current Outpatient Medications on File Prior to Visit  Medication Sig Dispense Refill   b complex vitamins capsule Take 1 capsule by mouth daily.     cetirizine (ZYRTEC) 10 MG tablet Take 10 mg by mouth daily.     cimetidine (TAGAMET) 200 MG tablet Take 200 mg by mouth 2 (two) times daily.     dorzolamidel-timolol (COSOPT PF) 22.3-6.8 MG/ML SOLN ophthalmic solution Place 1 drop into both eyes 2 (two) times daily.     linaclotide (LINZESS) 290 MCG CAPS capsule Take 290  mcg by mouth daily as needed (constipation).     Multiple Vitamin (MULTIVITAMIN WITH MINERALS) TABS tablet Take 1 tablet by mouth daily.     prednisoLONE acetate (PRED FORTE) 1 % ophthalmic suspension SMARTSIG:In Eye(s)     Tafluprost, PF, (ZIOPTAN) 0.0015 % SOLN Place 1 drop into both eyes at bedtime.     No current facility-administered medications on file prior to visit.    Past Medical History:  Diagnosis Date   Cancer [facility])     Past Surgical History:  Procedure Laterality Date   LYMPHADENECTOMY Bilateral 09/12/2012   Procedure: LYMPHADENECTOMY;  Surgeon: Alexis Frock, MD;  Location: [facility];  Service: Urology;  Laterality: Bilateral;   ROBOT ASSISTED LAPAROSCOPIC RADICAL PROSTATECTOMY N/A 09/12/2012   Procedure: ROBOTIC ASSISTED LAPAROSCOPIC RADICAL PROSTATECTOMY;  Surgeon: Alexis Frock, MD;  Location: [facility];  Service: Urology;  Laterality: N/A;    Social History   Socioeconomic History   Marital status: Divorced    Spouse name: Not on file   Number of children: Not on file   Years of education: Not on file   Highest education level: Not on file  Occupational History   Not on file  Tobacco Use   Smoking status: Former    Types: Cigarettes    Quit date: 09/12/1992    Years since quitting: 28.5   Smokeless tobacco: Never  Substance and Sexual Activity   Alcohol use: Not on file   Drug use: Not on file  Sexual activity: Not on file  Other Topics Concern   Not on file  Social History Narrative   Not on file   Social Determinants of Health   Financial Resource Strain: Not on file  Food Insecurity: Not on file  Transportation Needs: Not on file  Physical Activity: Not on file  Stress: Not on file  Social Connections: Not on file    History reviewed. No pertinent family history.  Review of Systems  Constitutional:  Negative for fever.  Respiratory:  Negative for cough, shortness of breath and wheezing.   Cardiovascular:  Negative for chest pain,  palpitations and leg swelling.  Gastrointestinal:  Positive for constipation. Negative for abdominal pain.  Musculoskeletal:  Positive for back pain. Negative for arthralgias.  Skin:        Rough area on left ear  Neurological:  Negative for dizziness, light-headedness and headaches.      Objective:   Vitals:   03/22/21 1112  BP: 128/82  Pulse: 69  Temp: 98.6 F (37 C)  SpO2: 99%   BP Readings from Last 3 Encounters:  03/22/21 128/82  01/27/21 (!) 138/92  09/23/20 (!) 130/92   Wt Readings from Last 3 Encounters:  03/22/21 175 lb (79.4 kg)  01/27/21 178 lb (80.7 kg)  09/23/20 178 lb (80.7 kg)   Body mass index is 22.47 kg/m.   No flowsheet data found.  No flowsheet data found.     Physical Exam    Constitutional: Appears well-developed and well-nourished. No distress.  HENT:  Head: Normocephalic and atraumatic.  Neck: Neck supple. No tracheal deviation present. No thyromegaly present.  No cervical lymphadenopathy Cardiovascular: Normal rate, regular rhythm and normal heart sounds.  No murmur heard. No carotid bruit .  No edema Pulmonary/Chest: Effort normal and breath sounds normal. No respiratory distress. No has no wheezes. No rales. Musculoskeletal: Scoliosis and thoracic spine Skin: Skin is warm and dry. Not diaphoretic.  Mild fibroma with dryness on the end anterior to left ear-has appointment with dermatology Psychiatric: Normal mood and affect. Behavior is normal.      Assessment & Plan:   Screened for depression using the PHQ 9 scale.  No evidence of depression.   Screened for anxiety using GAD7 Scale.  No evidence of anxiety.   See Problem List for Assessment and Plan of chronic medical problems.    Follow-up next September for his physical exam-sooner if needed

## 2021-03-22 ENCOUNTER — Ambulatory Visit: Payer: BC Managed Care – PPO | Admitting: Internal Medicine

## 2021-03-22 ENCOUNTER — Other Ambulatory Visit: Payer: Self-pay

## 2021-03-22 VITALS — BP 128/82 | HR 69 | Temp 98.6°F | Ht 74.0 in | Wt 175.0 lb

## 2021-03-22 DIAGNOSIS — K219 Gastro-esophageal reflux disease without esophagitis: Secondary | ICD-10-CM

## 2021-03-22 DIAGNOSIS — Z85828 Personal history of other malignant neoplasm of skin: Secondary | ICD-10-CM | POA: Insufficient documentation

## 2021-03-22 DIAGNOSIS — Z8546 Personal history of malignant neoplasm of prostate: Secondary | ICD-10-CM | POA: Diagnosis not present

## 2021-03-22 DIAGNOSIS — H409 Unspecified glaucoma: Secondary | ICD-10-CM | POA: Diagnosis not present

## 2021-03-22 DIAGNOSIS — Z86010 Personal history of colonic polyps: Secondary | ICD-10-CM | POA: Insufficient documentation

## 2021-03-22 DIAGNOSIS — K5900 Constipation, unspecified: Secondary | ICD-10-CM | POA: Diagnosis not present

## 2021-03-22 DIAGNOSIS — Z1331 Encounter for screening for depression: Secondary | ICD-10-CM | POA: Diagnosis not present

## 2021-03-22 DIAGNOSIS — J309 Allergic rhinitis, unspecified: Secondary | ICD-10-CM

## 2021-03-22 NOTE — Assessment & Plan Note (Signed)
Chronic GERD controlled Continue otc tagament 200 mg bid

## 2021-03-22 NOTE — Assessment & Plan Note (Signed)
Chronic Controlled Continue Zyrtec over-the-counter daily

## 2021-03-22 NOTE — Progress Notes (Signed)
Surgical Instructions    Your procedure is scheduled on Thursday, December 1st.  Report to [facility] Main Entrance "A" at 10:45 A.M., then check in with the Admitting office.  Call this number if you have problems the morning of surgery:  [phone]   If you have any questions prior to your surgery date call [phone]: Open Monday-Friday 8am-4pm    Remember:  Do not eat after midnight the night before your surgery  You may drink clear liquids until 9:45 AM the morning of your surgery.   Clear liquids allowed are: Water, Non-Citrus Juices (without pulp), Carbonated Beverages, Clear Tea, Black Coffee ONLY (NO MILK, CREAM OR POWDERED CREAMER of any kind), and Gatorade  Please complete your PRE-SURGERY ENSURE that was provided to you by 9:45 AM the morning of surgery.  Please, if able, drink it in one setting. DO NOT SIP.     Take these medicines the morning of surgery with A SIP OF WATER  Tagamet  If needed: Zyrtec Eye drops Linzess   As of today, STOP taking any Aspirin (unless otherwise instructed by your surgeon) Aleve, Naproxen, Ibuprofen, Motrin, Advil, Goody's, BC's, all herbal medications, fish oil, and all vitamins.  DAY OF SURGERY         Do not wear jewelry  Do not wear lotions, powders, colognes, or deodorant. Men may shave face and neck. Do not bring valuables to the hospital.             [facility] is not responsible for any belongings or valuables.  Do NOT Smoke (Tobacco/Vaping)  24 hours prior to your procedure  If you use a CPAP at night, you may bring your mask for your overnight stay.   Contacts, glasses, hearing aids, dentures or partials may not be worn into surgery, please bring cases for these belongings   For patients admitted to the hospital, discharge time will be determined by your treatment team.   Patients discharged the day of surgery will not be allowed to drive home, and someone needs to stay with them for 24 hours.  NO VISITORS WILL  BE ALLOWED IN PRE-OP WHERE PATIENTS ARE PREPPED FOR SURGERY.  ONLY 1 SUPPORT PERSON MAY BE PRESENT IN THE WAITING ROOM WHILE YOU ARE IN SURGERY.  IF YOU ARE TO BE ADMITTED, ONCE YOU ARE IN YOUR ROOM YOU WILL BE ALLOWED TWO (2) VISITORS. 1 (ONE) VISITOR MAY STAY OVERNIGHT BUT MUST ARRIVE TO THE ROOM BY 8pm.  Minor children may have two parents present. Special consideration for safety and communication needs will be reviewed on a case by case basis.  Special instructions:    Oral Hygiene is also important to reduce your risk of infection.  Remember - BRUSH YOUR TEETH THE MORNING OF SURGERY WITH YOUR REGULAR TOOTHPASTE   [place]- Preparing For Surgery  Before surgery, you can play an important role. Because skin is not sterile, your skin needs to be as free of germs as possible. You can reduce the number of germs on your skin by washing with CHG (chlorahexidine gluconate) Soap before surgery.  CHG is an antiseptic cleaner which kills germs and bonds with the skin to continue killing germs even after washing.     Please do not use if you have an allergy to CHG or antibacterial soaps. If your skin becomes reddened/irritated stop using the CHG.  Do not shave (including legs and underarms) for at least 48 hours prior to first CHG shower. It is OK to shave  your face.  Please follow these instructions carefully.     Shower the NIGHT BEFORE SURGERY and the MORNING OF SURGERY with CHG Soap.   If you chose to wash your hair, wash your hair first as usual with your normal shampoo. After you shampoo, rinse your hair and body thoroughly to remove the shampoo.  Then [employer] and genitals (private parts) with your normal soap and rinse thoroughly to remove soap.  After that Use CHG Soap as you would any other liquid soap. You can apply CHG directly to the skin and wash gently with a scrungie or a clean washcloth.   Apply the CHG Soap to your body ONLY FROM THE NECK DOWN.  Do not use on open wounds or  open sores. Avoid contact with your eyes, ears, mouth and genitals (private parts). Wash Face and genitals (private parts)  with your normal soap.   Wash thoroughly, paying special attention to the area where your surgery will be performed.  Thoroughly rinse your body with warm water from the neck down.  DO NOT shower/wash with your normal soap after using and rinsing off the CHG Soap.  Pat yourself dry with a CLEAN TOWEL.  Wear CLEAN PAJAMAS to bed the night before surgery  Place CLEAN SHEETS on your bed the night before your surgery  DO NOT SLEEP WITH PETS.   Day of Surgery:  Take a shower with CHG soap. Wear Clean/Comfortable clothing the morning of surgery Do not apply any deodorants/lotions.   Remember to brush your teeth WITH YOUR REGULAR TOOTHPASTE.   Please read over the following fact sheets that you were given.

## 2021-03-22 NOTE — Assessment & Plan Note (Signed)
Chronic Intermittent Taking linzess prn

## 2021-03-22 NOTE — Assessment & Plan Note (Signed)
Chronic Following at the [facility]

## 2021-03-22 NOTE — Assessment & Plan Note (Signed)
History of squamous cell carcinoma Sees dermatology regularly

## 2021-03-23 ENCOUNTER — Encounter (HOSPITAL_COMMUNITY): Payer: Self-pay

## 2021-03-23 ENCOUNTER — Encounter (HOSPITAL_COMMUNITY)
Admission: RE | Admit: 2021-03-23 | Discharge: 2021-03-23 | Disposition: A | Discharge status: Home / Self Care | Payer: BC Managed Care – PPO | Source: Ambulatory Visit | Attending: General Surgery | Admitting: General Surgery

## 2021-03-23 VITALS — BP 157/88 | HR 73 | Temp 98.2°F | Resp 18 | Ht 74.0 in | Wt 175.2 lb

## 2021-03-23 DIAGNOSIS — Z01818 Encounter for other preprocedural examination: Secondary | ICD-10-CM

## 2021-03-23 DIAGNOSIS — Z01812 Encounter for preprocedural laboratory examination: Secondary | ICD-10-CM | POA: Insufficient documentation

## 2021-03-23 HISTORY — DX: Unspecified osteoarthritis, unspecified site: M19.90

## 2021-03-23 LAB — CBC
HCT: 44.8 % (ref 39.0–52.0)
Hemoglobin: 14.7 g/dL (ref 13.0–17.0)
MCH: 32.6 pg (ref 26.0–34.0)
MCHC: 32.8 g/dL (ref 30.0–36.0)
MCV: 99.3 fL (ref 80.0–100.0)
Platelets: 255 10*3/uL (ref 150–400)
RBC: 4.51 MIL/uL (ref 4.22–5.81)
RDW: 12.2 % (ref 11.5–15.5)
WBC: 7.2 10*3/uL (ref 4.0–10.5)
nRBC: 0 % (ref 0.0–0.2)

## 2021-03-23 NOTE — Progress Notes (Signed)
PCP - Billey Gosling Cardiologist - denies  Chest x-ray - n/a EKG - n/a  ERAS Protcol - yes, Ensure ordered and given   COVID TEST- n/a (ambulatory)   Anesthesia review: n/a  Patient denies shortness of breath, fever, cough and chest pain at PAT appointment   All instructions explained to the patient, with a verbal understanding of the material. Patient agrees to go over the instructions while at home for a better understanding. Patient also instructed to self quarantine after being tested for COVID-19. The opportunity to ask questions was provided.

## 2021-03-30 NOTE — Progress Notes (Deleted)
Melvin [name] Phone: [phone] Subjective:    I'm seeing this patient by the request  of:  [name], MD  CC: Low back pain  [id number]  01/27/2021 Discussed with patient again at great length.  Patient has had significant arthritic changes and has been having signs and symptoms consistent with spinal stenosis.  Patient has seen other providers to agree with most of this but also states that most of his pain could be secondary to more of the arthritic changes noted.  I would like to repeat the x-rays and then I do think at this point patient has failed all other conservative therapy doing formal physical therapy multiple times and do feel advanced imaging with an MRI would be beneficial.  We have made changes to footwear and everything is well and he is continues to have the chronic back pain.  Does have a past medical history significant for prostate cancer but most recent PSA has been 0.9.  Continue the other medications.  Follow-up with me after imaging to discuss the possibility of such injections.  Patient has seen Dr. Nelva Bush and would be a good individual if any epidurals or nerve root or facet injections are necessary.  Total time discussed with patient as well as reviewing patient's previous notes as well as imaging greater than 32 minutes  Update 03/31/2021 Melvin [name] is a 75 y.o. male coming in with complaint of lumbar spine pain.  Patient was seen 2 months ago and we discussed the possibility of an MRI which has been ordered.  Patient states patient is scheduled though for a inguinal hernia repair tomorrow.       Past Medical History:  Diagnosis Date   Arthritis    Cancer [facility])    Past Surgical History:  Procedure Laterality Date   LYMPHADENECTOMY Bilateral 09/12/2012   Procedure: LYMPHADENECTOMY;  Surgeon: Alexis Frock, MD;  Location: [facility];  Service: Urology;  Laterality: Bilateral;   ROBOT  ASSISTED LAPAROSCOPIC RADICAL PROSTATECTOMY N/A 09/12/2012   Procedure: ROBOTIC ASSISTED LAPAROSCOPIC RADICAL PROSTATECTOMY;  Surgeon: Alexis Frock, MD;  Location: [facility];  Service: Urology;  Laterality: N/A;   TONSILLECTOMY     Social History   Socioeconomic History   Marital status: Divorced    Spouse name: Not on file   Number of children: Not on file   Years of education: Not on file   Highest education level: Not on file  Occupational History   Not on file  Tobacco Use   Smoking status: Former    Types: Cigarettes    Quit date: 09/12/1992    Years since quitting: 28.5   Smokeless tobacco: Never  Vaping Use   Vaping Use: Never used  Substance and Sexual Activity   Alcohol use: Not Currently   Drug use: Never   Sexual activity: Not on file  Other Topics Concern   Not on file  Social History Narrative   Not on file   Social Determinants of Health   Financial Resource Strain: Not on file  Food Insecurity: Not on file  Transportation Needs: Not on file  Physical Activity: Not on file  Stress: Not on file  Social Connections: Not on file   Allergies  Allergen Reactions   Sulfa Antibiotics Hives   No family history on file.    Current Outpatient Medications (Respiratory):    cetirizine (ZYRTEC) 10 MG tablet, Take 10 mg by mouth daily.    Current  Outpatient Medications (Other):    b complex vitamins capsule, Take 1 capsule by mouth daily.   cimetidine (TAGAMET) 200 MG tablet, Take 200 mg by mouth 2 (two) times daily.   dorzolamidel-timolol (COSOPT PF) 22.3-6.8 MG/ML SOLN ophthalmic solution, Place 1 drop into both eyes 2 (two) times daily.   linaclotide (LINZESS) 290 MCG CAPS capsule, Take 290 mcg by mouth daily as needed (constipation).   Multiple Vitamin (MULTIVITAMIN WITH MINERALS) TABS tablet, Take 1 tablet by mouth daily.   prednisoLONE acetate (PRED FORTE) 1 % ophthalmic suspension, SMARTSIG:In Eye(s)   Tafluprost, PF, (ZIOPTAN) 0.0015 % SOLN, Place 1  drop into both eyes at bedtime.   Reviewed prior external information including notes and imaging from  primary care provider As well as notes that were available from care everywhere and other healthcare systems.  Past medical history, social, surgical and family history all reviewed in electronic medical record.  No pertanent information unless stated regarding to the chief complaint.   Review of Systems:  No headache, visual changes, nausea, vomiting, diarrhea, constipation, dizziness, abdominal pain, skin rash, fevers, chills, night sweats, weight loss, swollen lymph nodes, body aches, joint swelling, chest pain, shortness of breath, mood changes. POSITIVE muscle aches  Objective  There were no vitals taken for this visit.   General: No apparent distress alert and oriented x3 mood and affect normal, dressed appropriately.  HEENT: Pupils equal, extraocular movements intact  Respiratory: Patient's speak in full sentences and does not appear short of breath  Cardiovascular: No lower extremity edema, non tender, no erythema  Gait normal with good balance and coordination.  MSK:  Non tender with full range of motion and good stability and symmetric strength and tone of shoulders, elbows, wrist, hip, knee and ankles bilaterally.     Impression and Recommendations:     The above documentation has been reviewed and is accurate and complete Lyndal Pulley, DO

## 2021-03-31 ENCOUNTER — Ambulatory Visit: Payer: BC Managed Care – PPO | Admitting: Family Medicine

## 2021-03-31 NOTE — H&P (Signed)
vChief Complaint: Hernia       History of Present Illness: Patient follows back up today secondary to a left-sided hernia that was found by Dr. Tresa Moore. Patient states he has no pain or discomfort to the area.   Patient was interested in having this both fixed concurrently.   ----------------- Melvin [name] is a 75 y.o. male who is seen today as an office consultation at the request of Dr. Tamala Julian and Doyle Askew for evaluation of Hernia Patient is a 75 year old male, who comes in secondary to a left inguinal hernia. Patient states has been there for several months.  He states that he has no pain however notices a large bulge in the left inguinal area.  He states that he does do some stretching exercises to help with the scoliosis.  Patient's had no signs or symptoms of incarceration or strangulation.   Patient's had a previous robotic prostatectomy by Dr. Tresa Moore in May 2014.     Review of Systems: A complete review of systems was obtained from the patient.  I have reviewed this information and discussed as appropriate with the patient.  See HPI as well for other ROS.   Review of Systems  Constitutional: Negative for fever.  HENT: Negative for congestion.   Eyes: Negative for blurred vision.  Respiratory: Negative for cough, shortness of breath and wheezing.   Cardiovascular: Negative for chest pain and palpitations.  Gastrointestinal: Negative for heartburn.  Genitourinary: Negative for dysuria.  Musculoskeletal: Negative for myalgias.  Skin: Negative for rash.  Neurological: Negative for dizziness and headaches.  Psychiatric/Behavioral: Negative for depression and suicidal ideas.  All other systems reviewed and are negative.       Medical History: Past Medical History                             Past Medical History: Diagnosis        Date            GERD (gastroesophageal reflux disease)                   Glaucoma (increased eye pressure)               History of cancer                  There is no problem list on file for this patient.     Past Surgical History                                      Past Surgical History: Procedure       Laterality         Date            LENS EYE SURGERY                                     PROSTATE SURGERY                              Allergies                             Allergies Allergen  Reactions            Sulfa (Sulfonamide Antibiotics)          Shortness Of Breath                                                             Current Outpatient Medications on File Prior to Visit Medication       Sig       Dispense         Refill            diclofenac (VOLTAREN) 75 MG EC tablet     TAKE 1 TABLET BY MOUTH TWICE DAILY.                                     dorzolamide-timolol (COSOPT PF) 2-0.5 % ophthalmic solution      Place 1 drop into both eyes 2 (two) times daily      60 each           11            dorzolamide-timoloL (COSOPT) 22.3-6.8 mg/mL ophthalmic solution         Place 1 drop into both eyes 2 (two) times daily (Patient not taking: No sig reported)                                  DULoxetine (CYMBALTA) 20 MG DR capsule          Take 1 capsule by mouth once daily                                 ergocalciferol, vitamin D2, 1,250 mcg (50,000 unit) capsule Take 50,000 Units by mouth every 7 (seven) days                            fluocinonide (LIDEX) 0.05 % cream   Apply topically                           latanoprost (XALATAN) 0.005 % ophthalmic solution           Apply to eye (Patient not taking: No sig reported)                             mometasone (NASONEX) 50 mcg/actuation nasal spray     Place into one nostril                            plasma rich growth factor ophthalmic solution           Place 1 drop into both eyes 4 (four) times daily                     6            predniSONE (DELTASONE) 20 MG tablet    Take 20 mg by mouth daily with breakfast (Patient not  taking: No sig reported)                                    ranitidine (ZANTAC) 150 MG tablet   Take 150 mg by mouth 2 (two) times daily                              tafluprost, PF, (ZIOPTAN PF) 0.0015 % opthalmic solution  Place 1 drop into both eyes nightly    30 Blister Pack            12   No current facility-administered medications on file prior to visit.     Family History                                          Family History Problem           Relation           Age of Onset            Skin cancer     Mother              Skin cancer     Father               Glaucoma        Father               Glaucoma        Paternal Grandfather          Social History                               Tobacco Use Smoking Status           Former Smoker            Types: Cigarettes            Quit date:        05/02/1988            Years since quitting:   32.5 Smokeless Tobacco   Never Used     Social History Social History                                              Socioeconomic History            Marital status:  Married Tobacco Use            Smoking status:          Former Smoker                           Types: Cigarettes                           Quit date:        05/02/1988                           Years since quitting:   59.5  Smokeless tobacco:    Never Used Substance and Sexual Activity            Alcohol use:    Not Currently            Drug use:        Never            Sexual activity:            Defer       Objective:                    Vitals:              11/12/20 1401 Pulse:  79 Temp:  36.4 C (97.6 F) SpO2:  98% Weight:            81.6 kg (179 lb 12.8 oz)   There is no height or weight on file to calculate BMI.   Physical Exam Constitutional:      Appearance: Normal appearance.  HENT:     Head: Normocephalic and atraumatic.     Nose: Nose normal. No congestion.     Mouth/Throat:     Mouth: Mucous membranes are moist.     Pharynx: Oropharynx is clear.  Eyes:      Pupils: Pupils are equal, round, and reactive to light.  Cardiovascular:     Rate and Rhythm: Normal rate and regular rhythm.     Pulses: Normal pulses.     Heart sounds: Normal heart sounds. No murmur heard.   No friction rub. No gallop.  Pulmonary:     Effort: Pulmonary effort is normal. No respiratory distress.     Breath sounds: Normal breath sounds. No stridor. No wheezing, rhonchi or rales.  Abdominal:     General: Abdomen is flat.     Hernia: A hernia is present. Hernia is present in the bilateral inguinal area.  Musculoskeletal:        General: Normal range of motion.     Cervical back: Normal range of motion.  Skin:    General: Skin is warm and dry.  Neurological:     General: No focal deficit present.     Mental Status: He is alert and oriented to person, place, and time.  Psychiatric:        Mood and Affect: Mood normal.        Thought Content: Thought content normal.            Assessment and Plan: Diagnoses and all orders for this visit:   Non-recurrent bilateral inguinal hernia without obstruction or gangrene     Melvin [name] is a 76 y.o. male    1.          We will proceed to the OR for a laparoscopic bilateral inguinal hernia repair with mesh. 2.         All risks and benefits were discussed with the patient, to generally include infection, bleeding, damage to surrounding structures, acute and chronic nerve pain, and recurrence. Alternatives were offered and described.  All questions were answered and the patient voiced understanding of the procedure and wishes to proceed at this point.             No follow-ups on file.   Ralene Ok, MD, [facility], [facility]

## 2021-03-31 NOTE — Anesthesia Preprocedure Evaluation (Addendum)
Anesthesia Evaluation  Patient identified by MRN, date of birth, ID band Patient awake    Reviewed: Allergy & Precautions, NPO status , Patient's Chart, lab work & pertinent test results  Airway Mallampati: II  TM Distance: >3 FB Neck ROM: Full    Dental no notable dental hx. (+) Teeth Intact, Caps   Pulmonary neg pulmonary ROS, former smoker,    Pulmonary exam normal breath sounds clear to auscultation       Cardiovascular Exercise Tolerance: Good Normal cardiovascular exam Rhythm:Regular Rate:Normal     Neuro/Psych negative neurological ROS     GI/Hepatic Neg liver ROS, GERD  ,  Endo/Other  negative endocrine ROS  Renal/GU    Prostate CA    Musculoskeletal  (+) Arthritis , Osteoarthritis,    Abdominal   Peds negative pediatric ROS (+)  Hematology Lab Results      Component                Value               Date                      WBC                      7.2                 03/23/2021                HGB                      14.7                03/23/2021                HCT                      44.8                03/23/2021                MCV                      99.3                03/23/2021                PLT                      255                 03/23/2021              Anesthesia Other Findings All: Sulfa  Reproductive/Obstetrics                            Anesthesia Physical Anesthesia Plan  ASA: 2  Anesthesia Plan: General   Post-op Pain Management: Ofirmev IV (intra-op) and Minimal or no pain anticipated   Induction: Intravenous  PONV Risk Score and Plan: 3 and Treatment may vary due to age or medical condition and Ondansetron  Airway Management Planned: Oral ETT  Additional Equipment:   Intra-op Plan:   Post-operative Plan: Extubation in OR  Informed Consent:     Dental advisory given  Plan Discussed with:   Anesthesia Plan Comments:  Anesthesia Quick Evaluation  

## 2021-04-01 ENCOUNTER — Encounter (HOSPITAL_COMMUNITY): Payer: Self-pay | Admitting: General Surgery

## 2021-04-01 ENCOUNTER — Ambulatory Visit (HOSPITAL_COMMUNITY): Payer: BC Managed Care – PPO | Admitting: Anesthesiology

## 2021-04-01 ENCOUNTER — Other Ambulatory Visit: Payer: Self-pay

## 2021-04-01 ENCOUNTER — Ambulatory Visit (HOSPITAL_COMMUNITY): Payer: BC Managed Care – PPO | Admitting: Vascular Surgery

## 2021-04-01 ENCOUNTER — Encounter (HOSPITAL_COMMUNITY)
Admission: RE | Disposition: A | Discharge status: Home / Self Care | Payer: Self-pay | Source: Home / Self Care | Attending: General Surgery

## 2021-04-01 ENCOUNTER — Ambulatory Visit (HOSPITAL_COMMUNITY)
Admission: RE | Admit: 2021-04-01 | Discharge: 2021-04-01 | Disposition: A | Discharge status: Home / Self Care | Payer: BC Managed Care – PPO | Attending: General Surgery | Admitting: General Surgery

## 2021-04-01 DIAGNOSIS — Z87891 Personal history of nicotine dependence: Secondary | ICD-10-CM | POA: Insufficient documentation

## 2021-04-01 DIAGNOSIS — M1990 Unspecified osteoarthritis, unspecified site: Secondary | ICD-10-CM | POA: Diagnosis not present

## 2021-04-01 DIAGNOSIS — K4020 Bilateral inguinal hernia, without obstruction or gangrene, not specified as recurrent: Secondary | ICD-10-CM | POA: Diagnosis present

## 2021-04-01 DIAGNOSIS — K4090 Unilateral inguinal hernia, without obstruction or gangrene, not specified as recurrent: Secondary | ICD-10-CM | POA: Insufficient documentation

## 2021-04-01 HISTORY — PX: INSERTION OF MESH: SHX5868

## 2021-04-01 HISTORY — PX: INGUINAL HERNIA REPAIR: SHX194

## 2021-04-01 SURGERY — REPAIR, HERNIA, INGUINAL, LAPAROSCOPIC
Anesthesia: General | Site: Inguinal | Laterality: Right

## 2021-04-01 MED ORDER — FENTANYL CITRATE (PF) 100 MCG/2ML IJ SOLN
25.0000 ug | INTRAMUSCULAR | Status: DC | PRN
  Administered 2021-04-01 (×2): 25 ug via INTRAVENOUS

## 2021-04-01 MED ORDER — SUGAMMADEX SODIUM 200 MG/2ML IV SOLN
INTRAVENOUS | Status: DC | PRN
  Administered 2021-04-01: 200 mg via INTRAVENOUS

## 2021-04-01 MED ORDER — ONDANSETRON HCL 4 MG/2ML IJ SOLN
INTRAMUSCULAR | Status: DC | PRN
  Administered 2021-04-01: 4 mg via INTRAVENOUS

## 2021-04-01 MED ORDER — PHENYLEPHRINE 40 MCG/ML (10ML) SYRINGE FOR IV PUSH (FOR BLOOD PRESSURE SUPPORT)
PREFILLED_SYRINGE | INTRAVENOUS | Status: DC | PRN
  Administered 2021-04-01: 120 ug via INTRAVENOUS

## 2021-04-01 MED ORDER — FENTANYL CITRATE (PF) 100 MCG/2ML IJ SOLN
INTRAMUSCULAR | Status: AC
  Filled 2021-04-01: qty 2

## 2021-04-01 MED ORDER — FENTANYL CITRATE (PF) 250 MCG/5ML IJ SOLN
INTRAMUSCULAR | Status: AC
  Filled 2021-04-01: qty 5

## 2021-04-01 MED ORDER — CHLORHEXIDINE GLUCONATE 0.12 % MT SOLN
15.0000 mL | Freq: Once | OROMUCOSAL | Status: AC
  Administered 2021-04-01: 15 mL via OROMUCOSAL
  Filled 2021-04-01: qty 15

## 2021-04-01 MED ORDER — BUPIVACAINE HCL (PF) 0.25 % IJ SOLN
INTRAMUSCULAR | Status: DC | PRN
  Administered 2021-04-01: 5 mL

## 2021-04-01 MED ORDER — PROPOFOL 10 MG/ML IV BOLUS
INTRAVENOUS | Status: DC | PRN
  Administered 2021-04-01: 50 mg via INTRAVENOUS
  Administered 2021-04-01: 100 mg via INTRAVENOUS

## 2021-04-01 MED ORDER — PROPOFOL 10 MG/ML IV BOLUS
INTRAVENOUS | Status: AC
  Filled 2021-04-01: qty 20

## 2021-04-01 MED ORDER — LACTATED RINGERS IV SOLN: INTRAVENOUS | Status: DC

## 2021-04-01 MED ORDER — ACETAMINOPHEN 10 MG/ML IV SOLN: 1000.0000 mg | Freq: Once | INTRAVENOUS | Status: DC | PRN

## 2021-04-01 MED ORDER — ORAL CARE MOUTH RINSE: 15.0000 mL | Freq: Once | OROMUCOSAL | Status: AC

## 2021-04-01 MED ORDER — ONDANSETRON HCL 4 MG/2ML IJ SOLN
INTRAMUSCULAR | Status: AC
  Filled 2021-04-01: qty 2

## 2021-04-01 MED ORDER — PHENYLEPHRINE HCL-NACL 20-0.9 MG/250ML-% IV SOLN
INTRAVENOUS | Status: DC | PRN
  Administered 2021-04-01: 30 ug/min via INTRAVENOUS

## 2021-04-01 MED ORDER — ACETAMINOPHEN 500 MG PO TABS
1000.0000 mg | ORAL_TABLET | ORAL | Status: AC
  Administered 2021-04-01: 1000 mg via ORAL
  Filled 2021-04-01: qty 2

## 2021-04-01 MED ORDER — CHLORHEXIDINE GLUCONATE CLOTH 2 % EX PADS: 6.0000 | MEDICATED_PAD | Freq: Once | CUTANEOUS | Status: DC

## 2021-04-01 MED ORDER — ONDANSETRON HCL 4 MG/2ML IJ SOLN: 4.0000 mg | Freq: Once | INTRAMUSCULAR | Status: DC | PRN

## 2021-04-01 MED ORDER — DEXAMETHASONE SODIUM PHOSPHATE 10 MG/ML IJ SOLN
INTRAMUSCULAR | Status: DC | PRN
  Administered 2021-04-01: 8 mg via INTRAVENOUS

## 2021-04-01 MED ORDER — PHENYLEPHRINE 40 MCG/ML (10ML) SYRINGE FOR IV PUSH (FOR BLOOD PRESSURE SUPPORT)
PREFILLED_SYRINGE | INTRAVENOUS | Status: AC
  Filled 2021-04-01: qty 10

## 2021-04-01 MED ORDER — BUPIVACAINE HCL (PF) 0.25 % IJ SOLN
INTRAMUSCULAR | Status: AC
  Filled 2021-04-01: qty 10

## 2021-04-01 MED ORDER — ROCURONIUM BROMIDE 10 MG/ML (PF) SYRINGE
PREFILLED_SYRINGE | INTRAVENOUS | Status: DC | PRN
  Administered 2021-04-01: 50 mg via INTRAVENOUS
  Administered 2021-04-01: 15 mg via INTRAVENOUS

## 2021-04-01 MED ORDER — LIDOCAINE 2% (20 MG/ML) 5 ML SYRINGE
INTRAMUSCULAR | Status: DC | PRN
  Administered 2021-04-01: 100 mg via INTRAVENOUS

## 2021-04-01 MED ORDER — 0.9 % SODIUM CHLORIDE (POUR BTL) OPTIME
TOPICAL | Status: DC | PRN
  Administered 2021-04-01: 1000 mL

## 2021-04-01 MED ORDER — ENSURE PRE-SURGERY PO LIQD: 296.0000 mL | Freq: Once | ORAL | Status: DC

## 2021-04-01 MED ORDER — CEFAZOLIN SODIUM-DEXTROSE 2-4 GM/100ML-% IV SOLN
2.0000 g | INTRAVENOUS | Status: AC
  Administered 2021-04-01: 2 g via INTRAVENOUS
  Filled 2021-04-01: qty 100

## 2021-04-01 MED ORDER — OXYCODONE-ACETAMINOPHEN 5-325 MG PO TABS: 1.0000 | ORAL_TABLET | ORAL | 0 refills | Status: DC | PRN

## 2021-04-01 MED ORDER — FENTANYL CITRATE (PF) 250 MCG/5ML IJ SOLN
INTRAMUSCULAR | Status: DC | PRN
  Administered 2021-04-01 (×2): 100 ug via INTRAVENOUS
  Administered 2021-04-01: 50 ug via INTRAVENOUS

## 2021-04-01 SURGICAL SUPPLY — 48 items
APPLIER CLIP LOGIC TI 5 (MISCELLANEOUS) IMPLANT
BAG COUNTER SPONGE SURGICOUNT (BAG) ×3 IMPLANT
CANISTER SUCT 3000ML PPV (MISCELLANEOUS) IMPLANT
CHLORAPREP W/TINT 26 (MISCELLANEOUS) ×3 IMPLANT
COVER SURGICAL LIGHT HANDLE (MISCELLANEOUS) ×3 IMPLANT
DERMABOND ADVANCED (GAUZE/BANDAGES/DRESSINGS) ×1
DERMABOND ADVANCED .7 DNX12 (GAUZE/BANDAGES/DRESSINGS) ×2 IMPLANT
DISSECTOR BLUNT TIP ENDO 5MM (MISCELLANEOUS) IMPLANT
DRAPE LAPAROSCOPIC ABDOMINAL (DRAPES) ×2 IMPLANT
ELECT REM PT RETURN 9FT ADLT (ELECTROSURGICAL) ×3
ELECTRODE REM PT RTRN 9FT ADLT (ELECTROSURGICAL) ×2 IMPLANT
ENDOLOOP SUT PDS II  0 18 (SUTURE) ×1
ENDOLOOP SUT PDS II 0 18 (SUTURE) IMPLANT
GLOVE SURG ENC MOIS LTX SZ7.5 (GLOVE) ×3 IMPLANT
GOWN STRL REUS W/ TWL LRG LVL3 (GOWN DISPOSABLE) ×4 IMPLANT
GOWN STRL REUS W/ TWL XL LVL3 (GOWN DISPOSABLE) ×2 IMPLANT
GOWN STRL REUS W/TWL LRG LVL3 (GOWN DISPOSABLE) ×2
GOWN STRL REUS W/TWL XL LVL3 (GOWN DISPOSABLE) ×1
KIT BASIN OR (CUSTOM PROCEDURE TRAY) ×3 IMPLANT
KIT TURNOVER KIT B (KITS) ×3 IMPLANT
MESH 3DMAX 4X6 LT LRG (Mesh General) IMPLANT
MESH 3DMAX 4X6 RT LRG (Mesh General) ×1 IMPLANT
NDL INSUFFLATION 14GA 120MM (NEEDLE) IMPLANT
NEEDLE INSUFFLATION 14GA 120MM (NEEDLE) ×3 IMPLANT
NS IRRIG 1000ML POUR BTL (IV SOLUTION) ×3 IMPLANT
PAD ARMBOARD 7.5X6 YLW CONV (MISCELLANEOUS) ×6 IMPLANT
RELOAD STAPLE 4.0 BLU F/HERNIA (INSTRUMENTS) IMPLANT
RELOAD STAPLE 4.8 BLK F/HERNIA (STAPLE) IMPLANT
RELOAD STAPLE HERNIA 4.0 BLUE (INSTRUMENTS) ×3 IMPLANT
RELOAD STAPLE HERNIA 4.8 BLK (STAPLE) IMPLANT
SCISSORS LAP 5X35 DISP (ENDOMECHANICALS) ×3 IMPLANT
SET IRRIG TUBING LAPAROSCOPIC (IRRIGATION / IRRIGATOR) IMPLANT
SET TROCAR LAP APPLE-HUNT 5MM (ENDOMECHANICALS) ×1 IMPLANT
SET TUBE SMOKE EVAC HIGH FLOW (TUBING) ×3 IMPLANT
STAPLER HERNIA 12 8.5 360D (INSTRUMENTS) ×1 IMPLANT
SUT MNCRL AB 4-0 PS2 18 (SUTURE) ×3 IMPLANT
SUT VIC AB 1 CT1 27 (SUTURE)
SUT VIC AB 1 CT1 27XBRD ANBCTR (SUTURE) IMPLANT
TOWEL GREEN STERILE (TOWEL DISPOSABLE) ×3 IMPLANT
TOWEL GREEN STERILE FF (TOWEL DISPOSABLE) ×3 IMPLANT
TRAY FOLEY W/BAG SLVR 16FR (SET/KITS/TRAYS/PACK)
TRAY FOLEY W/BAG SLVR 16FR ST (SET/KITS/TRAYS/PACK) ×2 IMPLANT
TRAY LAPAROSCOPIC MC (CUSTOM PROCEDURE TRAY) ×3 IMPLANT
TROCAR OPTICAL SHORT 5MM (TROCAR) ×2 IMPLANT
TROCAR OPTICAL SLV SHORT 5MM (TROCAR) ×2 IMPLANT
TROCAR XCEL 12X100 BLDLESS (ENDOMECHANICALS) ×3 IMPLANT
WARMER LAPAROSCOPE (MISCELLANEOUS) ×3 IMPLANT
WATER STERILE IRR 1000ML POUR (IV SOLUTION) ×3 IMPLANT

## 2021-04-01 NOTE — Interval H&P Note (Signed)
History and Physical Interval Note:  04/01/2021 8:41 AM  Melvin [name]  has presented today for surgery, with the diagnosis of BILATERAL INGUINAL HERNIA.  The various methods of treatment have been discussed with the patient and family. After consideration of risks, benefits and other options for treatment, the patient has consented to  Procedure(s): [name] (Bilateral) as a surgical intervention.  The patient's history has been reviewed, patient examined, no change in status, stable for surgery.  I have reviewed the patient's chart and labs.  Questions were answered to the patient's satisfaction.     Ralene Ok

## 2021-04-01 NOTE — Discharge Instructions (Signed)
[facility] _______[facility] ? ?INGUINAL HERNIA REPAIR: POST OP INSTRUCTIONS ? ?Always review your discharge instruction sheet given to you by the facility where your surgery was performed. ?IF YOU HAVE DISABILITY OR FAMILY LEAVE FORMS, YOU MUST BRING THEM TO THE OFFICE FOR PROCESSING.   ?DO NOT GIVE THEM TO YOUR DOCTOR. ? ?1. A  prescription for pain medication may be given to you upon discharge.  Take your pain medication as prescribed, if needed.  If narcotic pain medicine is not needed, then you may take acetaminophen (Tylenol) or ibuprofen (Advil) as needed. ?2. Take your usually prescribed medications unless otherwise directed. ?If you need a refill on your pain medication, please contact your pharmacy.  They will contact our office to request authorization. Prescriptions will not be filled after 5 pm or on week-ends. ?3. You should follow a light diet the first 24 hours after arrival home, such as soup and crackers, etc.  Be sure to include lots of fluids daily.  Resume your normal diet the day after surgery. ?4.Most patients will experience some swelling and bruising around the umbilicus or in the groin and scrotum.  Ice packs and reclining will help.  Swelling and bruising can take several days to resolve.  ?6. It is common to experience some constipation if taking pain medication after surgery.  Increasing fluid intake and taking a stool softener (such as Colace) will usually help or prevent this problem from occurring.  A mild laxative (Milk of Magnesia or Miralax) should be taken according to package directions if there are no bowel movements after 48 hours. ?7. Unless discharge instructions indicate otherwise, you may remove your bandages 24-48 hours after surgery, and you may shower at that time.  You may have steri-strips (small skin tapes) in place directly over the incision.  These strips should be left on the skin for 7-10 days.  If your surgeon used skin glue on the incision, you may  shower in 24 hours.  The glue will flake off over the next 2-3 weeks.  Any sutures or staples will be removed at the office during your follow-up visit. ?8. ACTIVITIES:  You may resume regular (light) daily activities beginning the next day--such as daily self-care, walking, climbing stairs--gradually increasing activities as tolerated.  You may have sexual intercourse when it is comfortable.  Refrain from any heavy lifting or straining until approved by your doctor. ? ?a.You may drive when you are no longer taking prescription pain medication, you can comfortably wear a seatbelt, and you can safely maneuver your car and apply brakes. ?b.RETURN TO WORK:   ?_____________________________________________ ? ?9.You should see your doctor in the office for a follow-up appointment approximately 2-3 weeks after your surgery.  Make sure that you call for this appointment within a day or two after you arrive home to insure a convenient appointment time. ?10.OTHER INSTRUCTIONS: _________________________ ?   _____________________________________ ? ?WHEN TO CALL YOUR DOCTOR: ?Fever over 101.0 ?Inability to urinate ?Nausea and/or vomiting ?Extreme swelling or bruising ?Continued bleeding from incision. ?Increased pain, redness, or drainage from the incision ? ?The clinic staff is available to answer your questions during regular business hours.  Please don?t hesitate to call and ask to speak to one of the nurses for clinical concerns.  If you have a medical emergency, go to the nearest emergency room or call 911.  A surgeon from [facility] is always on call at the hospital ? ? ?[address], [address], [place], [place]    [address] ? ? [address] [address], [place],    [address] ?[phone] ? [phone] ? FAX [phone] ?Web site: [email or web address] ? ?

## 2021-04-01 NOTE — Op Note (Addendum)
04/01/2021  10:27 AM  PATIENT:  Melvin [name]  75 y.o. male  PRE-OPERATIVE DIAGNOSIS:  BILATERAL INGUINAL HERNIA  POST-OPERATIVE DIAGNOSIS:  UNILATERAL INGUINAL HERNIA  PROCEDURE:  Procedure(s): LAPAROSCOPIC INGUINAL HERNIA (Right) INSERTION OF MESH (Right)  SURGEON:  Surgeon(s) and Role:    Ralene Ok, MD - Primary   ASSISTANTS: Otho Bellows, MD PGY-6   ANESTHESIA:   local and general  EBL:  5 mL   BLOOD ADMINISTERED:none  DRAINS: none   LOCAL MEDICATIONS USED:  BUPIVICAINE   SPECIMEN:  No Specimen  DISPOSITION OF SPECIMEN:  N/A  COUNTS:  YES  TOURNIQUET:  * No tourniquets in log *  DICTATION: .Dragon Dictation   Counts: reported as correct x 2  Findings:  The patient had a Large right indirect hernia. He had a significant amount of scar tissue from his prostate surgery.  There was no hernia that could be seen on the left side.  Indications for procedure:  The patient is a 75 year-old male with a Right >Left inguinal hernia for several months. Patient complained of symptomatology to his right inguinal area he was found to have a small possible hernia on the left side.  The patient was taken back for elective inguinal hernia repair.  Details of the procedure: The patient was taken back to the operating room. The patient was placed in supine position with bilateral SCDs in place.  The patient underwent GETA.   The patient was prepped and draped in the usual sterile fashion.  After appropriate anitbiotics were confirmed, a time-out was confirmed and all facts were verified.  0.25% Marcaine was used to infiltrate the umbilical area. A 11-blade was used to cut down the skin and blunt dissection was used to get the anterior fashion.  The anterior fascia was incised approximately 1 cm and the muscles were retracted laterally. Blunt dissection was then used to create a space in the preperitoneal area. At this time a 10 mm camera was then introduced into the space  and advanced the pubic tubercle and a 12 mm trocar was placed over this and insufflation was started.  At this time it could be seen that there was a significant amount of scar tissue from his prostate surgery.  There was some fusion of the preperitoneal plane.  At this time and space was created from medial to laterally the preperitoneal space.  Cooper's ligament was not able to be seen as there was muscle adherhent to the peritoneum.  The spermatic cord was visualized and dissected.  The hernia sac was identified and dissected away from the cremesteric muscle fibers.  The hernia was seen in the inindirect space. Dissection of the hernia sac was undertaken the vas deferens was identified and protected in all parts of the case.   There was a small tear into the hernia sac. A Veress needle right upper quadrant to help evacuate the intraperitoneal air.    Once the hernia sac was taken down to approximately the umbilicus a Bard 3D Max mesh, size: Large, was  introduced into the preperitoneal space.  The mesh was brought over to cover the direct and indirect hernia spaces.  This was anchored to the anterior abdominal wall with 4.8 mm staples. The hernia sac was seen lying posterior to the mesh. There was no staples placed laterally.   The peritoneal holes were closed with staples.  I attempted to continue dissection on the left side of the preperitoneal space.  There is significant mount of adhesions  from the peritoneum of the posterior rectus fascia area.  I proceeded to create a space laterally and visualize the spermatic cord.  The spermatic cord cannot be visualized that this appeared to be densely adherent to the peritoneum.  There isit can be created.  There is no overt hernia that could be seen.  I decided to abandon the left-sided repair.  The insufflation was evacuated and the peritoneum was seen posterior to the mesh bilaterally. The trochars were removed. The anterior fascia was reapproximated using  #1 Vicryl on a UR- 6.  Intra-abdominal air was evacuated and the Veress needle removed. The skin was reapproximated using 4-0 Monocryl subcuticular fashion and was dressed with Dermabond. The  patient was awakened from general anesthesia and taken to recovery in stable condition.  I was personally present during the key and critical portions of this procedure and immediately available throughout the entire procedure, as documented in my operative note.   PLAN OF CARE: Discharge to home after PACU  PATIENT DISPOSITION:  PACU - hemodynamically stable.   Delay start of Pharmacological VTE agent (>24hrs) due to surgical blood loss or risk of bleeding: not applicable

## 2021-04-01 NOTE — Anesthesia Procedure Notes (Signed)
Procedure Name: Intubation Date/Time: 04/01/2021 9:22 AM Performed by: Inda Coke, CRNA Pre-anesthesia Checklist: Patient identified, Emergency Drugs available, Suction available and Patient being monitored Patient Re-evaluated:Patient Re-evaluated prior to induction Oxygen Delivery Method: Circle System Utilized Preoxygenation: Pre-oxygenation with 100% oxygen Induction Type: IV induction Ventilation: Mask ventilation without difficulty Laryngoscope Size: Mac and 4 Grade View: Grade II Tube type: Oral Tube size: 7.5 mm Number of attempts: 1 Airway Equipment and Method: Stylet and Oral airway Placement Confirmation: ETT inserted through vocal cords under direct vision, positive ETCO2 and breath sounds checked- equal and bilateral Secured at: 23 cm Tube secured with: Tape Dental Injury: Teeth and Oropharynx as per pre-operative assessment

## 2021-04-01 NOTE — Transfer of Care (Signed)
Immediate Anesthesia Transfer of Care Note  Patient: Melvin [name]  Procedure(s) Performed: LAPAROSCOPIC INGUINAL HERNIA (Right: Inguinal) INSERTION OF MESH (Right: Inguinal)  Patient Location: PACU  Anesthesia Type:General  Level of Consciousness: awake and drowsy  Airway & Oxygen Therapy: Patient Spontanous Breathing and Patient connected to face mask oxygen  Post-op Assessment: Report given to RN and Post -op Vital signs reviewed and stable  Post vital signs: Reviewed and stable  Last Vitals:  Vitals Value Taken Time  BP 127/55 04/01/21 1044  Temp    Pulse 60 04/01/21 1044  Resp 16 04/01/21 1044  SpO2 100 % 04/01/21 1044  Vitals shown include unvalidated device data.  Last Pain:  Vitals:   04/01/21 0745  TempSrc:   PainSc: 0-No pain         Complications: No notable events documented.

## 2021-04-01 NOTE — Anesthesia Postprocedure Evaluation (Signed)
Anesthesia Post Note  Patient: Melvin [name]  Procedure(s) Performed: LAPAROSCOPIC INGUINAL HERNIA (Right: Inguinal) INSERTION OF MESH (Right: Inguinal)     Patient location during evaluation: PACU Anesthesia Type: General Level of consciousness: awake and alert Pain management: pain level controlled Vital Signs Assessment: post-procedure vital signs reviewed and stable Respiratory status: spontaneous breathing, nonlabored ventilation, respiratory function stable and patient connected to nasal cannula oxygen Cardiovascular status: blood pressure returned to baseline and stable Postop Assessment: no apparent nausea or vomiting Anesthetic complications: no   No notable events documented.  Last Vitals:  Vitals:   04/01/21 1130 04/01/21 1144  BP: (!) 157/92 (!) 160/81  Pulse: (!) 59 (!) 55  Resp: 15 15  Temp:  36.9 C  SpO2: 99% 99%    Last Pain:  Vitals:   04/01/21 1144  TempSrc:   PainSc: 0-No pain                 Barnet Glasgow

## 2021-04-02 ENCOUNTER — Encounter (HOSPITAL_COMMUNITY): Payer: Self-pay | Admitting: General Surgery

## 2021-04-13 ENCOUNTER — Other Ambulatory Visit: Payer: BC Managed Care – PPO

## 2021-04-14 ENCOUNTER — Ambulatory Visit
Admission: RE | Admit: 2021-04-14 | Discharge: 2021-04-14 | Disposition: A | Discharge status: Home / Self Care | Payer: BC Managed Care – PPO | Source: Ambulatory Visit | Attending: Family Medicine | Admitting: Family Medicine

## 2021-04-14 ENCOUNTER — Other Ambulatory Visit: Payer: Self-pay

## 2021-04-14 DIAGNOSIS — M545 Low back pain, unspecified: Secondary | ICD-10-CM

## 2021-04-16 NOTE — Progress Notes (Signed)
[place] Phone: [phone] Subjective:   Melvin [name], am serving as a scribe for Dr. Hulan Saas. This visit occurred during the SARS-CoV-2 public health emergency.  Safety protocols were in place, including screening questions prior to the visit, additional usage of staff PPE, and extensive cleaning of exam room while observing appropriate contact time as indicated for disinfecting solutions.   I'm seeing this patient by the request  of:  [name], MD  CC: Low back pain follow-up  [id number]  01/27/2021 Discussed with patient again at great length.  Patient has had significant arthritic changes and has been having signs and symptoms consistent with spinal stenosis.  Patient has seen other providers to agree with most of this but also states that most of his pain could be secondary to more of the arthritic changes noted.  I would like to repeat the x-rays and then I do think at this point patient has failed all other conservative therapy doing formal physical therapy multiple times and do feel advanced imaging with an MRI would be beneficial.  We have made changes to footwear and everything is well and he is continues to have the chronic back pain.  Does have a past medical history significant for prostate cancer but most recent PSA has been 0.9.  Continue the other medications.  Follow-up with me after imaging to discuss the possibility of such injections.  Patient has seen Dr. Nelva Bush and would be a good individual if any epidurals or nerve root or facet injections are necessary.  Total time discussed with patient as well as reviewing patient's previous notes as well as imaging greater than 32 minutes  Updated 04/19/2021 Melvin [name] is a 75 y.o. male coming in with complaint of back pain. Here for MRI results.   MRI IMPRESSION: 1. 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  Patient has multiple areas of arthritic changes.  Patient does have what appears to be a spinal stenosis moderate to severe in the L4-L5 area.       Past Medical History:  Diagnosis Date   Arthritis    Cancer [facility])    Past Surgical History:  Procedure Laterality Date   INGUINAL HERNIA REPAIR Right 04/01/2021   Procedure: LAPAROSCOPIC INGUINAL HERNIA;  Surgeon: Ralene Ok, MD;  Location: [place];  Service: General;  Laterality: Right;   INSERTION OF MESH Right 04/01/2021   Procedure: INSERTION OF MESH;  Surgeon: Ralene Ok, MD;  Location: [place];  Service: General;  Laterality: Right;   LYMPHADENECTOMY Bilateral 09/12/2012   Procedure: LYMPHADENECTOMY;  Surgeon: Alexis Frock, MD;  Location: [facility];  Service: Urology;  Laterality: Bilateral;   ROBOT ASSISTED LAPAROSCOPIC RADICAL PROSTATECTOMY N/A 09/12/2012   Procedure: ROBOTIC ASSISTED LAPAROSCOPIC RADICAL PROSTATECTOMY;  Surgeon: Alexis Frock, MD;  Location: [facility];  Service: Urology;  Laterality: N/A;   TONSILLECTOMY     Social History   Socioeconomic History   Marital status: Divorced    Spouse name: Not on file   Number of children: Not on file   Years of education: Not on file   Highest education level: Not on file  Occupational History   Not on file  Tobacco Use   Smoking status: Former    Types: Cigarettes    Quit date: 09/12/1992    Years since quitting: 28.6   Smokeless tobacco: Never  Vaping Use   Vaping Use:  Never used  Substance and Sexual Activity   Alcohol use: Not Currently   Drug use: Never   Sexual activity: Not on file  Other Topics Concern   Not on file  Social History Narrative   Not on file   Social Determinants of Health   Financial Resource Strain: Not on file  Food Insecurity: Not on file  Transportation Needs: Not on file  Physical Activity: Not on file  Stress: Not on file  Social Connections: Not on file   Allergies  Allergen Reactions   Sulfa  Antibiotics Hives   [name] family history on file.    Current Outpatient Medications (Respiratory):    cetirizine (ZYRTEC) 10 MG tablet, Take 10 mg by mouth daily.  Current Outpatient Medications (Analgesics):    oxyCODONE-acetaminophen (PERCOCET) 5-325 MG tablet, Take 1 tablet by mouth every 4 (four) hours as needed for severe pain.   Current Outpatient Medications (Other):    b complex vitamins capsule, Take 1 capsule by mouth daily.   cimetidine (TAGAMET) 200 MG tablet, Take 200 mg by mouth 2 (two) times daily.   dorzolamidel-timolol (COSOPT PF) 22.3-6.8 MG/ML SOLN ophthalmic solution, Place 1 drop into both eyes 2 (two) times daily.   linaclotide (LINZESS) 290 MCG CAPS capsule, Take 290 mcg by mouth daily as needed (constipation).   Multiple Vitamin (MULTIVITAMIN WITH MINERALS) TABS tablet, Take 1 tablet by mouth daily.   prednisoLONE acetate (PRED FORTE) 1 % ophthalmic suspension, SMARTSIG:In Eye(s)   Tafluprost, PF, (ZIOPTAN) 0.0015 % SOLN, Place 1 drop into both eyes at bedtime.   Reviewed prior external information including notes and imaging from  primary care provider As well as notes that were available from care everywhere and other healthcare systems.  MRI as discussed above  Past medical history, social, surgical and family history all reviewed in electronic medical record.  [name] pertanent information unless stated regarding to the chief complaint.   Review of Systems:  [name] headache, visual changes, nausea, vomiting, diarrhea, constipation, dizziness, abdominal pain, skin rash, fevers, chills, night sweats, weight loss, swollen lymph nodes, body aches, joint swelling, chest pain, shortness of breath, mood changes. POSITIVE muscle aches  Objective  Blood pressure 128/70, pulse (!) 113, height 6\' 2"  (1.88 m), weight 174 lb (78.9 kg), SpO2 98 %.   General: [name] apparent distress alert and oriented x3 mood and affect normal, dressed appropriately.  HEENT: Pupils equal,  extraocular movements intact  Respiratory: Patient's speak in full sentences and does not appear short of breath  Cardiovascular: [name] lower extremity edema, non tender, [name] erythema  Back exam does have some loss of lordosis.  Some tenderness to palpation of the paraspinal musculature.  Patient does have tightness with FABER test bilaterally.  Significant scoliosis noted as well.  Sitting relatively comfortably with next to his wife today.  Antalgic gait noted    Impression and Recommendations:     The above documentation has been reviewed and is accurate and complete Lyndal Pulley, DO

## 2021-04-18 ENCOUNTER — Encounter: Payer: Self-pay | Admitting: Family Medicine

## 2021-04-19 ENCOUNTER — Other Ambulatory Visit: Payer: Self-pay

## 2021-04-19 ENCOUNTER — Encounter: Payer: Self-pay | Admitting: Family Medicine

## 2021-04-19 ENCOUNTER — Ambulatory Visit (INDEPENDENT_AMBULATORY_CARE_PROVIDER_SITE_OTHER): Payer: BC Managed Care – PPO | Admitting: Family Medicine

## 2021-04-19 DIAGNOSIS — M48061 Spinal stenosis, lumbar region without neurogenic claudication: Secondary | ICD-10-CM

## 2021-04-19 DIAGNOSIS — M5416 Radiculopathy, lumbar region: Secondary | ICD-10-CM

## 2021-04-19 NOTE — Assessment & Plan Note (Signed)
Patient does have a spinal stenosis noted.  Discussed with patient as well as his wife today.  We went over the MRI in great detail today.  Went over the MRI before he saw patient to be ready for the meeting as well and discussed different treatment options.  Total time with patient today greater than 31 minutes.  Patient will follow-up will go through with a epidural to see how patient responds.  Depending on how patient does we will then advance him accordingly with some of his activities.  If more difficulty would need to consider potential surgical intervention.

## 2021-04-19 NOTE — Patient Instructions (Signed)
Epidural next step: [phone] See me 6 weeks after

## 2021-05-04 ENCOUNTER — Ambulatory Visit
Admission: RE | Admit: 2021-05-04 | Discharge: 2021-05-04 | Disposition: A | Discharge status: Home / Self Care | Payer: BC Managed Care – PPO | Source: Ambulatory Visit | Attending: Family Medicine | Admitting: Family Medicine

## 2021-05-04 DIAGNOSIS — M5416 Radiculopathy, lumbar region: Secondary | ICD-10-CM

## 2021-05-04 MED ORDER — IOPAMIDOL (ISOVUE-M 200) INJECTION 41%
1.0000 mL | Freq: Once | INTRAMUSCULAR | Status: AC
  Administered 2021-05-04: 1 mL via EPIDURAL

## 2021-05-04 MED ORDER — METHYLPREDNISOLONE ACETATE 40 MG/ML INJ SUSP (RADIOLOG
80.0000 mg | Freq: Once | INTRAMUSCULAR | Status: AC
  Administered 2021-05-04: 80 mg via EPIDURAL

## 2021-05-04 NOTE — Discharge Instructions (Signed)

## 2021-05-26 NOTE — Progress Notes (Signed)
[place] Phone: [phone] Subjective:   [name], am serving as a scribe for Dr. Hulan Saas. This visit occurred during the SARS-CoV-2 public health emergency.  Safety protocols were in place, including screening questions prior to the visit, additional usage of staff PPE, and extensive cleaning of exam room while observing appropriate contact time as indicated for disinfecting solutions.  I'm seeing this patient by the request  of:  [name], MD  CC: Back pain f/u   [id number]  04/19/2021 Patient does have a spinal stenosis noted.  Discussed with patient as well as his wife today.  We went over the MRI in great detail today.  Went over the MRI before he saw patient to be ready for the meeting as well and discussed different treatment options.  Total time with patient today greater than 31 minutes.  Patient will follow-up will go through with a epidural to see how patient responds.  Depending on how patient does we will then advance him accordingly with some of his activities.  If more difficulty would need to consider potential surgical intervention.  Update 05/27/2021 Daryle Amis is a 76 y.o. male coming in with complaint of lumbar spine stenosis. Epidural 05/04/2021. Patient states that he had only 4 days of relief after epidural. Pain returned back to prior level.  Patient does have a MRI showing the patient does have significant spinal stenosis as well as degenerative disc disease and facet arthropathy.  Patient states that the pain seems to be more localized in the back now.  No significant radiation of the pain down the legs as much.  Patient still states that with certain activities the pain seems to be worse.  Still not affecting daily activities all the time for his sleep.      Past Medical History:  Diagnosis Date   Arthritis    Cancer [facility])    Past Surgical History:  Procedure Laterality  Date   INGUINAL HERNIA REPAIR Right 04/01/2021   Procedure: LAPAROSCOPIC INGUINAL HERNIA;  Surgeon: Ralene Ok, MD;  Location: [place];  Service: General;  Laterality: Right;   INSERTION OF MESH Right 04/01/2021   Procedure: INSERTION OF MESH;  Surgeon: Ralene Ok, MD;  Location: [place];  Service: General;  Laterality: Right;   LYMPHADENECTOMY Bilateral 09/12/2012   Procedure: LYMPHADENECTOMY;  Surgeon: Alexis Frock, MD;  Location: [facility];  Service: Urology;  Laterality: Bilateral;   ROBOT ASSISTED LAPAROSCOPIC RADICAL PROSTATECTOMY N/A 09/12/2012   Procedure: ROBOTIC ASSISTED LAPAROSCOPIC RADICAL PROSTATECTOMY;  Surgeon: Alexis Frock, MD;  Location: [facility];  Service: Urology;  Laterality: N/A;   TONSILLECTOMY     Social History   Socioeconomic History   Marital status: Married    Spouse name: Not on file   Number of children: Not on file   Years of education: Not on file   Highest education level: Not on file  Occupational History   Not on file  Tobacco Use   Smoking status: Former    Types: Cigarettes    Quit date: 09/12/1992    Years since quitting: 28.7   Smokeless tobacco: Never  Vaping Use   Vaping Use: Never used  Substance and Sexual Activity   Alcohol use: Not Currently   Drug use: Never   Sexual activity: Not on file  Other Topics Concern   Not on file  Social History Narrative   Not on file   Social Determinants of  Health   Financial Resource Strain: Not on file  Food Insecurity: Not on file  Transportation Needs: Not on file  Physical Activity: Not on file  Stress: Not on file  Social Connections: Not on file   Allergies  Allergen Reactions   Sulfa Antibiotics Hives   No family history on file.    Current Outpatient Medications (Respiratory):    cetirizine (ZYRTEC) 10 MG tablet, Take 10 mg by mouth daily.  Current Outpatient Medications (Analgesics):    oxyCODONE-acetaminophen (PERCOCET) 5-325 MG tablet, Take 1 tablet by mouth every 4  (four) hours as needed for severe pain.   Current Outpatient Medications (Other):    b complex vitamins capsule, Take 1 capsule by mouth daily.   cimetidine (TAGAMET) 200 MG tablet, Take 200 mg by mouth 2 (two) times daily.   dorzolamidel-timolol (COSOPT PF) 22.3-6.8 MG/ML SOLN ophthalmic solution, Place 1 drop into both eyes 2 (two) times daily.   linaclotide (LINZESS) 290 MCG CAPS capsule, Take 290 mcg by mouth daily as needed (constipation).   Multiple Vitamin (MULTIVITAMIN WITH MINERALS) TABS tablet, Take 1 tablet by mouth daily.   prednisoLONE acetate (PRED FORTE) 1 % ophthalmic suspension, SMARTSIG:In Eye(s)   Tafluprost, PF, (ZIOPTAN) 0.0015 % SOLN, Place 1 drop into both eyes at bedtime.    Review of Systems:  No headache, visual changes, nausea, vomiting, diarrhea, constipation, dizziness, abdominal pain, skin rash, fevers, chills, night sweats, weight loss, swollen lymph nodes, body aches, joint swelling, chest pain, shortness of breath, mood changes. POSITIVE muscle aches  Objective  Blood pressure 120/74, pulse 80, height 6\' 2"  (1.88 m), weight 171 lb (77.6 kg), SpO2 98 %.   General: No apparent distress alert and oriented x3 mood and affect normal, dressed appropriately.  HEENT: Pupils equal, extraocular movements intact  Respiratory: Patient's speak in full sentences and does not appear short of breath  Cardiovascular: No lower extremity edema, non tender, no erythema  Gait severely antalgic Back exam does have degenerative scoliosis noted.  Patient does have some mild atrophy of the lower extremities bilaterally.  Sitting comfortably in the chair though.    Impression and Recommendations:     The above documentation has been reviewed and is accurate and complete Lyndal Pulley, DO

## 2021-05-27 ENCOUNTER — Encounter: Payer: Self-pay | Admitting: Family Medicine

## 2021-05-27 ENCOUNTER — Ambulatory Visit: Payer: BC Managed Care – PPO | Admitting: Family Medicine

## 2021-05-27 ENCOUNTER — Other Ambulatory Visit: Payer: Self-pay

## 2021-05-27 VITALS — BP 120/74 | HR 80 | Ht 74.0 in | Wt 171.0 lb

## 2021-05-27 DIAGNOSIS — M48061 Spinal stenosis, lumbar region without neurogenic claudication: Secondary | ICD-10-CM | POA: Diagnosis not present

## 2021-05-27 DIAGNOSIS — M5442 Lumbago with sciatica, left side: Secondary | ICD-10-CM

## 2021-05-27 DIAGNOSIS — G8929 Other chronic pain: Secondary | ICD-10-CM | POA: Diagnosis not present

## 2021-05-27 NOTE — Assessment & Plan Note (Signed)
Patient does have the degenerative spinal stenosis with significant scoliosis noted as well.  Patient does have facet arthropathy as well.  Patient states now after the injection having more pain that seems to be more pain in the facet area.  We will try facet injections to see if this will be more beneficial.  Seems to be more at the L4-L5 area.  Depending on how patient responds we will continue to see how patient does and if worsening pain do still need to consider surgical intervention.  Total time reviewing patient's MRI, injections with fluoroscopy, medications and discussing with patient greater than 31 minutes

## 2021-05-27 NOTE — Patient Instructions (Addendum)
Do prescribed exercises at least 3x a week [place] [phone] Call Today  See you again 4 weeks after injections

## 2021-05-31 ENCOUNTER — Ambulatory Visit
Admission: RE | Admit: 2021-05-31 | Discharge: 2021-05-31 | Disposition: A | Discharge status: Home / Self Care | Payer: BC Managed Care – PPO | Source: Ambulatory Visit | Attending: Family Medicine | Admitting: Family Medicine

## 2021-05-31 ENCOUNTER — Other Ambulatory Visit: Payer: Self-pay

## 2021-05-31 DIAGNOSIS — G8929 Other chronic pain: Secondary | ICD-10-CM

## 2021-05-31 DIAGNOSIS — M5442 Lumbago with sciatica, left side: Secondary | ICD-10-CM

## 2021-05-31 MED ORDER — IOPAMIDOL (ISOVUE-M 200) INJECTION 41%
1.0000 mL | Freq: Once | INTRAMUSCULAR | Status: AC
  Administered 2021-05-31: 1 mL via INTRA_ARTICULAR

## 2021-05-31 MED ORDER — METHYLPREDNISOLONE ACETATE 40 MG/ML INJ SUSP (RADIOLOG
80.0000 mg | Freq: Once | INTRAMUSCULAR | Status: AC
  Administered 2021-05-31: 80 mg via INTRA_ARTICULAR

## 2021-05-31 NOTE — Discharge Instructions (Signed)

## 2021-06-02 ENCOUNTER — Ambulatory Visit: Payer: BC Managed Care – PPO | Admitting: Family Medicine

## 2021-06-28 ENCOUNTER — Ambulatory Visit: Payer: BC Managed Care – PPO | Admitting: Family Medicine

## 2021-06-28 NOTE — Progress Notes (Signed)
[place] Phone: [phone] Subjective:   Melvin [name], am serving as a scribe for Dr. Hulan Saas.  This visit occurred during the SARS-CoV-2 public health emergency.  Safety protocols were in place, including screening questions prior to the visit, additional usage of staff PPE, and extensive cleaning of exam room while observing appropriate contact time as indicated for disinfecting solutions.   I'm seeing this patient by the request  of:  [name], MD  CC: low back pain f/u   [id number]  05/27/2021 Patient does have the degenerative spinal stenosis with significant scoliosis noted as well.  Patient does have facet arthropathy as well.  Patient states now after the injection having more pain that seems to be more pain in the facet area.  We will try facet injections to see if this will be more beneficial.  Seems to be more at the L4-L5 area.  Depending on how patient responds we will continue to see how patient does and if worsening pain do still need to consider surgical intervention.  Total time reviewing patient's MRI, injections with fluoroscopy, medications and discussing with patient greater than 31 minutes  Update 06/29/2021 Melvin [name] is a 76 y.o. male coming in with complaint of LBP. Bilateral facet injections  on 05/31/2021. Patient states that he did not have any relief from the injections. Pain worse in mornings and when he goes for a walk. Pain is not constant.     Scheduled for hernia surgery 3/10    Past Medical History:  Diagnosis Date   Arthritis    Cancer [facility])    Past Surgical History:  Procedure Laterality Date   INGUINAL HERNIA REPAIR Right 04/01/2021   Procedure: LAPAROSCOPIC INGUINAL HERNIA;  Surgeon: Ralene Ok, MD;  Location: [place];  Service: General;  Laterality: Right;   INSERTION OF MESH Right 04/01/2021   Procedure: INSERTION OF MESH;  Surgeon: Ralene Ok,  MD;  Location: [place];  Service: General;  Laterality: Right;   LYMPHADENECTOMY Bilateral 09/12/2012   Procedure: LYMPHADENECTOMY;  Surgeon: Alexis Frock, MD;  [facility];  Service: Urology;  Laterality: Bilateral;   ROBOT ASSISTED LAPAROSCOPIC RADICAL PROSTATECTOMY N/A 09/12/2012   Procedure: ROBOTIC ASSISTED LAPAROSCOPIC RADICAL PROSTATECTOMY;  Surgeon: Alexis Frock, MD;  Location[facility];  Service: Urology;  Laterality: N/A;   TONSILLECTOMY     Social History   Socioeconomic History   Marital status: Married    Spouse name: Not on file   Number of children: Not on file   Years of education: Not on file   Highest education level: Not on file  Occupational History   Not on file  Tobacco Use   Smoking status: Former    Types: Cigarettes    Quit date: 09/12/1992    Years since quitting: 28.8   Smokeless tobacco: Never  Vaping Use   Vaping Use: Never used  Substance and Sexual Activity   Alcohol use: Not Currently   Drug use: Never   Sexual activity: Not on file  Other Topics Concern   Not on file  Social History Narrative   Not on file   Social Determinants of Health   Financial Resource Strain: Not on file  Food Insecurity: Not on file  Transportation Needs: Not on file  Physical Activity: Not on file  Stress: Not on file  Social Connections: Not on file   Allergies  Allergen Reactions   Sulfa Antibiotics Hives  [name] family history on file.    Current Outpatient Medications (Respiratory):    cetirizine (ZYRTEC) 10 MG tablet, Take 10 mg by mouth daily.  Current Outpatient Medications (Analgesics):    diclofenac (VOLTAREN) 75 MG EC tablet, Take 1 tablet (75 mg total) by mouth 2 (two) times daily.   oxyCODONE-acetaminophen (PERCOCET) 5-325 MG tablet, Take 1 tablet by mouth every 4 (four) hours as needed for severe pain.   Current Outpatient Medications (Other):    b complex vitamins capsule, Take 1 capsule by mouth daily.   cimetidine (TAGAMET) 200 MG  tablet, Take 200 mg by mouth 2 (two) times daily.   dorzolamidel-timolol (COSOPT PF) 22.3-6.8 MG/ML SOLN ophthalmic solution, Place 1 drop into both eyes 2 (two) times daily.   linaclotide (LINZESS) 290 MCG CAPS capsule, Take 290 mcg by mouth daily as needed (constipation).   Multiple Vitamin (MULTIVITAMIN WITH MINERALS) TABS tablet, Take 1 tablet by mouth daily.   prednisoLONE acetate (PRED FORTE) 1 % ophthalmic suspension, SMARTSIG:In Eye(s)   Tafluprost, PF, (ZIOPTAN) 0.0015 % SOLN, Place 1 drop into both eyes at bedtime.   Reviewed prior external information including notes and imaging from  primary care provider As well as notes that were available from care everywhere and other healthcare systems.  Past medical history, social, surgical and family history all reviewed in electronic medical record.  [name] pertanent information unless stated regarding to the chief complaint.   Review of Systems:  [name] headache, visual changes, nausea, vomiting, diarrhea, constipation, dizziness, abdominal pain, skin rash, fevers, chills, night sweats, weight loss, swollen lymph nodes, body aches, joint swelling, chest pain, shortness of breath, mood changes. POSITIVE muscle aches  Objective  Blood pressure 118/70, pulse (!) 53, height 6\' 2"  (1.88 m), weight 176 lb (79.8 kg), SpO2 99 %.   General: [name] apparent distress alert and oriented x3 mood and affect normal, dressed appropriately.  HEENT: Pupils equal, extraocular movements intact  Respiratory: Patient's speak in full sentences and does not appear short of breath  Cardiovascular: [name] lower extremity edema, non tender, [name] erythema  Gait antalgic with severe pes planus.   MSK:  severe spinal stenosis with significant scoliosis noted as well.  Patient does have some mild atrophy of the lower extremities.  Patient does have some mild tightness with FABER test as well. 4-5 strength noted of the lower extremities.  Significant pes planus as stated above.    Impression and Recommendations:     The above documentation has been reviewed and is accurate and complete Lyndal Pulley, DO

## 2021-06-29 ENCOUNTER — Other Ambulatory Visit: Payer: Self-pay

## 2021-06-29 ENCOUNTER — Ambulatory Visit: Payer: BC Managed Care – PPO | Admitting: Family Medicine

## 2021-06-29 ENCOUNTER — Encounter: Payer: Self-pay | Admitting: Family Medicine

## 2021-06-29 DIAGNOSIS — M48061 Spinal stenosis, lumbar region without neurogenic claudication: Secondary | ICD-10-CM | POA: Diagnosis not present

## 2021-06-29 MED ORDER — DICLOFENAC SODIUM 75 MG PO TBEC: 75.0000 mg | DELAYED_RELEASE_TABLET | Freq: Two times a day (BID) | ORAL | 0 refills | Status: DC

## 2021-06-29 NOTE — Patient Instructions (Signed)
Take 3-5 days in a row when in pain Stop before surgery Will get you into PT when you are released

## 2021-06-29 NOTE — Assessment & Plan Note (Signed)
Spent greater than 31 minutes talking to patient as well as reviewing his chart.  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  This is a chronic problem with exacerbation.  Given diclofenac and warned of potential side effects but using them in bursts 2 times a day for 5 days at a time could be beneficial.  Patient feels that certain medications such as gabapentin, Cymbalta were not helpful and he would like to avoid them.  Follow-up with me again in 6 to 8 weeks

## 2021-07-05 ENCOUNTER — Ambulatory Visit: Payer: Self-pay | Admitting: General Surgery

## 2021-07-06 ENCOUNTER — Other Ambulatory Visit (HOSPITAL_COMMUNITY): Payer: BC Managed Care – PPO

## 2021-07-06 NOTE — Progress Notes (Signed)
Surgical Instructions ? ? ? Your procedure is scheduled on 07/09/21. ? Report to [facility] Main Entrance "A" at 12:00 P.M., then check in with the Admitting office. ? Call this number if you have problems the morning of surgery: ? [phone] ? ? If you have any questions prior to your surgery date call [phone]: Open Monday-Friday 8am-4pm ? ? ? Remember: ? Do not eat after midnight the night before your surgery ? ?You may drink clear liquids until 11:00 the morning of your surgery.   ?Clear liquids allowed are: Water, Non-Citrus Juices (without pulp), Carbonated Beverages, Clear Tea, Black Coffee ONLY (NO MILK, CREAM OR POWDERED CREAMER of any kind), and Gatorade ? ?Please complete your PRE-SURGERY ENSURE that was provided to you by 11:00 the morning of surgery.  Please, if able, drink it in one setting. DO NOT SIP.  ?  ? Take these medicines the morning of surgery with A SIP OF WATER:  ?cimetidine (TAGAMET)  ?dorzolamidel-timolol (COSOPT PF)  ? ? ?As of today, STOP taking any Aspirin (unless otherwise instructed by your surgeon) Aleve, Naproxen, Ibuprofen, Motrin, Advil, Goody's, BC's, all herbal medications, fish oil, and all vitamins. ? ?         ?Do not wear jewelry  ?Do not wear lotions, powders, colognes, or deodorant. ?            Men may shave face and neck. ?Do not bring valuables to the hospital. ? ? ?[facility] is not responsible for any belongings or valuables. .  ? ?Do NOT Smoke (Tobacco/Vaping)  24 hours prior to your procedure ? ?If you use a CPAP at night, you may bring your mask for your overnight stay. ?  ?Contacts, glasses, hearing aids, dentures or partials may not be worn into surgery, please bring cases for these belongings ?  ?For patients admitted to the hospital, discharge time will be determined by your treatment team. ?  ?Patients discharged the day of surgery will not be allowed to drive home, and someone needs to stay with them for 24 hours. ? ?NO VISITORS WILL BE ALLOWED IN  PRE-OP WHERE PATIENTS ARE PREPPED FOR SURGERY.  ONLY 1 SUPPORT PERSON MAY BE PRESENT IN THE WAITING ROOM WHILE YOU ARE IN SURGERY.  IF YOU ARE TO BE ADMITTED, ONCE YOU ARE IN YOUR ROOM YOU WILL BE ALLOWED TWO (2) VISITORS. 1 (ONE) VISITOR MAY STAY OVERNIGHT BUT MUST ARRIVE TO THE ROOM BY 8pm.  Minor children may have two parents present. Special consideration for safety and communication needs will be reviewed on a case by case basis. ? ?Special instructions:   ? ?Oral Hygiene is also important to reduce your risk of infection.  Remember - BRUSH YOUR TEETH THE MORNING OF SURGERY WITH YOUR REGULAR TOOTHPASTE ? ? ?[facility]- Preparing For Surgery ? ?Before surgery, you can play an important role. Because skin is not sterile, your skin needs to be as free of germs as possible. You can reduce the number of germs on your skin by washing with CHG (chlorahexidine gluconate) Soap before surgery.  CHG is an antiseptic cleaner which kills germs and bonds with the skin to continue killing germs even after washing.   ? ? ?Please do not use if you have an allergy to CHG or antibacterial soaps. If your skin becomes reddened/irritated stop using the CHG.  ?Do not shave (including legs and underarms) for at least 48 hours prior to first CHG shower. It is OK to shave your face. ? ?  Please follow these instructions carefully. ?  ? ? Shower the NIGHT BEFORE SURGERY and the MORNING OF SURGERY with CHG Soap.  ? If you chose to wash your hair, wash your hair first as usual with your normal shampoo. After you shampoo, rinse your hair and body thoroughly to remove the shampoo.  Then [employer] and genitals (private parts) with your normal soap and rinse thoroughly to remove soap. ? ?After that Use CHG Soap as you would any other liquid soap. You can apply CHG directly to the skin and wash gently with a scrungie or a clean washcloth.  ? ?Apply the CHG Soap to your body ONLY FROM THE NECK DOWN.  Do not use on open wounds or open sores. Avoid  contact with your eyes, ears, mouth and genitals (private parts). Wash Face and genitals (private parts)  with your normal soap.  ? ?Wash thoroughly, paying special attention to the area where your surgery will be performed. ? ?Thoroughly rinse your body with warm water from the neck down. ? ?DO NOT shower/wash with your normal soap after using and rinsing off the CHG Soap. ? ?Pat yourself dry with a CLEAN TOWEL. ? ?Wear CLEAN PAJAMAS to bed the night before surgery ? ?Place CLEAN SHEETS on your bed the night before your surgery ? ?DO NOT SLEEP WITH PETS. ? ? ?Day of Surgery: ? ?Take a shower with CHG soap. ?Wear Clean/Comfortable clothing the morning of surgery ?Do not apply any deodorants/lotions.   ?Remember to brush your teeth WITH YOUR REGULAR TOOTHPASTE. ? ? ? ? ?  ?Please read over the following fact sheets that you were given.   ?

## 2021-07-07 ENCOUNTER — Other Ambulatory Visit: Payer: Self-pay

## 2021-07-07 ENCOUNTER — Encounter (HOSPITAL_COMMUNITY): Payer: Self-pay

## 2021-07-07 ENCOUNTER — Encounter (HOSPITAL_COMMUNITY)
Admission: RE | Admit: 2021-07-07 | Discharge: 2021-07-07 | Disposition: A | Discharge status: Home / Self Care | Payer: BC Managed Care – PPO | Source: Ambulatory Visit | Attending: General Surgery | Admitting: General Surgery

## 2021-07-07 VITALS — BP 158/86 | HR 69 | Temp 97.9°F | Resp 17 | Ht 73.0 in | Wt 173.0 lb

## 2021-07-07 DIAGNOSIS — Z01812 Encounter for preprocedural laboratory examination: Secondary | ICD-10-CM | POA: Insufficient documentation

## 2021-07-07 DIAGNOSIS — Z01818 Encounter for other preprocedural examination: Secondary | ICD-10-CM

## 2021-07-07 LAB — CBC
HCT: 43.5 % (ref 39.0–52.0)
Hemoglobin: 14 g/dL (ref 13.0–17.0)
MCH: 32.7 pg (ref 26.0–34.0)
MCHC: 32.2 g/dL (ref 30.0–36.0)
MCV: 101.6 fL — ABNORMAL HIGH (ref 80.0–100.0)
Platelets: 240 10*3/uL (ref 150–400)
RBC: 4.28 MIL/uL (ref 4.22–5.81)
RDW: 12.4 % (ref 11.5–15.5)
WBC: 7.5 10*3/uL (ref 4.0–10.5)
nRBC: 0 % (ref 0.0–0.2)

## 2021-07-07 NOTE — Progress Notes (Signed)
PCP: Billey Gosling, MD ?Cardiologist: denies ? ?EKG: Sela Hua, PA stated not needed. ?CXR: na ?ECHO: denies ?Stress Test: denies ?Cardiac Cath: denies ? ?Fasting Blood Sugar- na ?Checks Blood Sugar__na_ times a day ? ?OSA/CPAP: No ? ?ASA/Blood Thinner: No ? ?Covid test not needed - ambulatory ? ?Anesthesia Review: No ? ?Patient denies shortness of breath, fever, cough, and chest pain at PAT appointment. ? ?Patient verbalized understanding of instructions provided today at the PAT appointment.  Patient asked to review instructions at home and day of surgery.   ?

## 2021-07-09 ENCOUNTER — Other Ambulatory Visit: Payer: Self-pay

## 2021-07-09 ENCOUNTER — Ambulatory Visit (HOSPITAL_COMMUNITY): Payer: BC Managed Care – PPO | Admitting: Certified Registered Nurse Anesthetist

## 2021-07-09 ENCOUNTER — Ambulatory Visit (HOSPITAL_COMMUNITY)
Admission: RE | Admit: 2021-07-09 | Discharge: 2021-07-09 | Disposition: A | Discharge status: Home / Self Care | Payer: BC Managed Care – PPO | Attending: General Surgery | Admitting: General Surgery

## 2021-07-09 ENCOUNTER — Encounter (HOSPITAL_COMMUNITY): Payer: Self-pay | Admitting: General Surgery

## 2021-07-09 ENCOUNTER — Encounter (HOSPITAL_COMMUNITY)
Admission: RE | Disposition: A | Discharge status: Home / Self Care | Payer: Self-pay | Source: Home / Self Care | Attending: General Surgery

## 2021-07-09 DIAGNOSIS — K4090 Unilateral inguinal hernia, without obstruction or gangrene, not specified as recurrent: Secondary | ICD-10-CM | POA: Insufficient documentation

## 2021-07-09 DIAGNOSIS — Z8546 Personal history of malignant neoplasm of prostate: Secondary | ICD-10-CM | POA: Insufficient documentation

## 2021-07-09 DIAGNOSIS — Z87891 Personal history of nicotine dependence: Secondary | ICD-10-CM | POA: Insufficient documentation

## 2021-07-09 DIAGNOSIS — Z9079 Acquired absence of other genital organ(s): Secondary | ICD-10-CM | POA: Insufficient documentation

## 2021-07-09 HISTORY — PX: INGUINAL HERNIA REPAIR: SHX194

## 2021-07-09 HISTORY — PX: INSERTION OF MESH: SHX5868

## 2021-07-09 SURGERY — REPAIR, HERNIA, INGUINAL, ADULT
Anesthesia: General | Site: Inguinal | Laterality: Left

## 2021-07-09 MED ORDER — CHLORHEXIDINE GLUCONATE CLOTH 2 % EX PADS: 6.0000 | MEDICATED_PAD | Freq: Once | CUTANEOUS | Status: DC

## 2021-07-09 MED ORDER — ONDANSETRON HCL 4 MG/2ML IJ SOLN
INTRAMUSCULAR | Status: DC | PRN
Start: 2021-07-09 — End: 2021-07-09
  Administered 2021-07-09: 4 mg via INTRAVENOUS

## 2021-07-09 MED ORDER — BUPIVACAINE HCL (PF) 0.25 % IJ SOLN
INTRAMUSCULAR | Status: DC | PRN
  Administered 2021-07-09: 7 mL

## 2021-07-09 MED ORDER — LACTATED RINGERS IV SOLN: INTRAVENOUS | Status: DC

## 2021-07-09 MED ORDER — ONDANSETRON HCL 4 MG/2ML IJ SOLN
INTRAMUSCULAR | Status: AC
  Filled 2021-07-09: qty 2

## 2021-07-09 MED ORDER — ENSURE PRE-SURGERY PO LIQD: 296.0000 mL | Freq: Once | ORAL | Status: DC

## 2021-07-09 MED ORDER — PROPOFOL 10 MG/ML IV BOLUS
INTRAVENOUS | Status: DC | PRN
  Administered 2021-07-09: 100 mg via INTRAVENOUS

## 2021-07-09 MED ORDER — SUGAMMADEX SODIUM 200 MG/2ML IV SOLN
INTRAVENOUS | Status: DC | PRN
  Administered 2021-07-09: 200 mg via INTRAVENOUS

## 2021-07-09 MED ORDER — BUPIVACAINE HCL (PF) 0.25 % IJ SOLN
INTRAMUSCULAR | Status: AC
  Filled 2021-07-09: qty 30

## 2021-07-09 MED ORDER — FENTANYL CITRATE (PF) 100 MCG/2ML IJ SOLN: 25.0000 ug | INTRAMUSCULAR | Status: DC | PRN

## 2021-07-09 MED ORDER — FENTANYL CITRATE (PF) 250 MCG/5ML IJ SOLN
INTRAMUSCULAR | Status: DC | PRN
  Administered 2021-07-09: 50 ug via INTRAVENOUS
  Administered 2021-07-09: 100 ug via INTRAVENOUS

## 2021-07-09 MED ORDER — 0.9 % SODIUM CHLORIDE (POUR BTL) OPTIME
TOPICAL | Status: DC | PRN
  Administered 2021-07-09: 1000 mL

## 2021-07-09 MED ORDER — ACETAMINOPHEN 10 MG/ML IV SOLN: 1000.0000 mg | Freq: Once | INTRAVENOUS | Status: DC | PRN

## 2021-07-09 MED ORDER — ROCURONIUM BROMIDE 10 MG/ML (PF) SYRINGE
PREFILLED_SYRINGE | INTRAVENOUS | Status: AC
  Filled 2021-07-09: qty 10

## 2021-07-09 MED ORDER — LIDOCAINE 2% (20 MG/ML) 5 ML SYRINGE
INTRAMUSCULAR | Status: DC | PRN
  Administered 2021-07-09: 100 mg via INTRAVENOUS

## 2021-07-09 MED ORDER — ORAL CARE MOUTH RINSE: 15.0000 mL | Freq: Once | OROMUCOSAL | Status: AC

## 2021-07-09 MED ORDER — TRAMADOL HCL 50 MG PO TABS: 50.0000 mg | ORAL_TABLET | Freq: Four times a day (QID) | ORAL | 0 refills | Status: DC | PRN

## 2021-07-09 MED ORDER — ACETAMINOPHEN 500 MG PO TABS
1000.0000 mg | ORAL_TABLET | ORAL | Status: AC
  Administered 2021-07-09: 1000 mg via ORAL
  Filled 2021-07-09: qty 2

## 2021-07-09 MED ORDER — CHLORHEXIDINE GLUCONATE 0.12 % MT SOLN
15.0000 mL | Freq: Once | OROMUCOSAL | Status: AC
  Administered 2021-07-09: 15 mL via OROMUCOSAL
  Filled 2021-07-09: qty 15

## 2021-07-09 MED ORDER — ROCURONIUM BROMIDE 10 MG/ML (PF) SYRINGE
PREFILLED_SYRINGE | INTRAVENOUS | Status: DC | PRN
  Administered 2021-07-09: 10 mg via INTRAVENOUS
  Administered 2021-07-09: 20 mg via INTRAVENOUS
  Administered 2021-07-09: 50 mg via INTRAVENOUS

## 2021-07-09 MED ORDER — FENTANYL CITRATE (PF) 250 MCG/5ML IJ SOLN
INTRAMUSCULAR | Status: AC
  Filled 2021-07-09: qty 5

## 2021-07-09 MED ORDER — CEFAZOLIN SODIUM-DEXTROSE 2-4 GM/100ML-% IV SOLN
2.0000 g | INTRAVENOUS | Status: AC
  Administered 2021-07-09: 2 g via INTRAVENOUS
  Filled 2021-07-09: qty 100

## 2021-07-09 MED ORDER — ONDANSETRON HCL 4 MG/2ML IJ SOLN: 4.0000 mg | Freq: Once | INTRAMUSCULAR | Status: DC | PRN

## 2021-07-09 SURGICAL SUPPLY — 48 items
BAG COUNTER SPONGE SURGICOUNT (BAG) ×2 IMPLANT
BAG SURGICOUNT SPONGE COUNTING (BAG) ×1
BLADE CLIPPER SURG (BLADE) IMPLANT
CANISTER SUCT 3000ML PPV (MISCELLANEOUS) IMPLANT
CHLORAPREP W/TINT 26 (MISCELLANEOUS) ×3 IMPLANT
COVER SURGICAL LIGHT HANDLE (MISCELLANEOUS) ×3 IMPLANT
DERMABOND ADVANCED (GAUZE/BANDAGES/DRESSINGS) ×2
DERMABOND ADVANCED .7 DNX12 (GAUZE/BANDAGES/DRESSINGS) ×1 IMPLANT
DRAIN PENROSE 1/2X12 LTX STRL (WOUND CARE) ×2 IMPLANT
DRAPE LAPAROSCOPIC ABDOMINAL (DRAPES) ×3 IMPLANT
ELECT REM PT RETURN 9FT ADLT (ELECTROSURGICAL) ×3
ELECTRODE REM PT RTRN 9FT ADLT (ELECTROSURGICAL) ×1 IMPLANT
GAUZE 4X4 16PLY ~~LOC~~+RFID DBL (SPONGE) ×3 IMPLANT
GLOVE SRG 8 PF TXTR STRL LF DI (GLOVE) ×1 IMPLANT
GLOVE SURG SYN 7.5  E (GLOVE) ×2
GLOVE SURG SYN 7.5 E (GLOVE) ×1 IMPLANT
GLOVE SURG SYN 7.5 PF PI (GLOVE) ×1 IMPLANT
GLOVE SURG UNDER POLY LF SZ8 (GLOVE) ×2
GOWN STRL REUS W/ TWL LRG LVL3 (GOWN DISPOSABLE) ×1 IMPLANT
GOWN STRL REUS W/ TWL XL LVL3 (GOWN DISPOSABLE) ×1 IMPLANT
GOWN STRL REUS W/TWL LRG LVL3 (GOWN DISPOSABLE) ×2
GOWN STRL REUS W/TWL XL LVL3 (GOWN DISPOSABLE) ×2
KIT BASIN OR (CUSTOM PROCEDURE TRAY) ×3 IMPLANT
KIT TURNOVER KIT B (KITS) ×3 IMPLANT
MESH PARIETEX PROGRIP LEFT (Mesh General) ×2 IMPLANT
NDL HYPO 25GX1X1/2 BEV (NEEDLE) ×1 IMPLANT
NEEDLE HYPO 25GX1X1/2 BEV (NEEDLE) ×3 IMPLANT
NS IRRIG 1000ML POUR BTL (IV SOLUTION) ×3 IMPLANT
PACK GENERAL/GYN (CUSTOM PROCEDURE TRAY) ×3 IMPLANT
PAD ARMBOARD 7.5X6 YLW CONV (MISCELLANEOUS) ×6 IMPLANT
PENCIL SMOKE EVACUATOR (MISCELLANEOUS) ×3 IMPLANT
SPONGE INTESTINAL PEANUT (DISPOSABLE) IMPLANT
SUT MNCRL AB 4-0 PS2 18 (SUTURE) ×3 IMPLANT
SUT PROLENE 2 0 SH DA (SUTURE) ×3 IMPLANT
SUT SILK 0 TIES 10X30 (SUTURE) ×3 IMPLANT
SUT VIC AB 2-0 SH 27 (SUTURE) ×4
SUT VIC AB 2-0 SH 27X BRD (SUTURE) ×1 IMPLANT
SUT VIC AB 2-0 SH 27XBRD (SUTURE) IMPLANT
SUT VIC AB 3-0 SH 27 (SUTURE) ×2
SUT VIC AB 3-0 SH 27X BRD (SUTURE) IMPLANT
SUT VIC AB 3-0 SH 27XBRD (SUTURE) ×1 IMPLANT
SUT VICRYL AB 2 0 TIES (SUTURE) ×3 IMPLANT
SYR CONTROL 10ML LL (SYRINGE) ×3 IMPLANT
SYRINGE TOOMEY DISP (SYRINGE) ×3 IMPLANT
TOWEL GREEN STERILE (TOWEL DISPOSABLE) ×3 IMPLANT
TOWEL GREEN STERILE FF (TOWEL DISPOSABLE) ×3 IMPLANT
TRAY FOL W/BAG SLVR 16FR STRL (SET/KITS/TRAYS/PACK) ×1 IMPLANT
TRAY FOLEY W/BAG SLVR 16FR LF (SET/KITS/TRAYS/PACK) ×2

## 2021-07-09 NOTE — Anesthesia Postprocedure Evaluation (Signed)
Anesthesia Post Note ? ?Patient: Christian Borgerding ? ?Procedure(s) Performed: OPEN LEFT INGUINAL HERNIA REPAIR WITH MESH (Left: Inguinal) ?INSERTION OF MESH (Left: Inguinal) ? ?  ? ?Patient location during evaluation: PACU ?Anesthesia Type: General ?Level of consciousness: awake and alert ?Pain management: pain level controlled ?Vital Signs Assessment: post-procedure vital signs reviewed and stable ?Respiratory status: spontaneous breathing, nonlabored ventilation, respiratory function stable and patient connected to nasal cannula oxygen ?Cardiovascular status: blood pressure returned to baseline and stable ?Postop Assessment: no apparent nausea or vomiting ?Anesthetic complications: no ? ? ?No notable events documented. ? ?Last Vitals:  ?Vitals:  ? 07/09/21 1430 07/09/21 1445  ?BP: (!) 149/66 (!) 158/76  ?Pulse: 64 (!) 58  ?Resp: 18 (!) 24  ?Temp:  36.6 ?C  ?SpO2: 97% 98%  ?  ?Last Pain:  ?Vitals:  ? 07/09/21 1445  ?TempSrc:   ?PainSc: 0-No pain  ? ? ?  ?  ?  ?  ?  ?  ? ?[place]et [place] ? ? ? ? ?

## 2021-07-09 NOTE — Op Note (Signed)
07/09/2021 ? ?2:04 PM ? ?PATIENT:  Melvin [name]  76 y.o. male ? ?PRE-OPERATIVE DIAGNOSIS:  LEFT INGUINAL HERNIA ? ?POST-OPERATIVE DIAGNOSIS:  LEFT direct sliding INGUINAL HERNIA ? ?PROCEDURE:  Procedure(s): ?OPEN LEFT INGUINAL HERNIA REPAIR WITH MESH (Left) ?INSERTION OF MESH (Left) ? ?SURGEON:  Surgeon(s) and Role: ?   Ralene Ok, MD - Primary ? ?ASSISTANTS: Dr. Albin Felling, PGY-6  ? ?ANESTHESIA:   local and general ? ?EBL:  minimal  ? ?BLOOD ADMINISTERED:none ? ?DRAINS: none  ? ?LOCAL MEDICATIONS USED:  BUPIVICAINE  ? ?SPECIMEN:  No Specimen ? ?DISPOSITION OF SPECIMEN:  N/A ? ?COUNTS:  YES ? ?TOURNIQUET:  * No tourniquets in log * ? ?DICTATION: .Dragon Dictation ? ?Findings: Pt with a sliding indirect inguinal hernia ? ? ?Details of the procedure: The patient was taken back to the operating room. The patient was placed in supine position with bilateral SCDs in place. The patient was prepped and draped in the usual sterile fashion.  After appropriate anitbiotics were confirmed, a time-out was confirmed and all facts were verified.  Quarter percent Marcaine was used to infiltrate the area of the incision and an ilioinguinal nerve block was also placed.  ? ?A 5 cm incision was made just 1 cm superior to the inguinal ligament. Bovie cautery was used to maintain hemostasis dissection is carried down to the external oblique.  A standard incision was made laterally, and the external oblique was bluntly dissected away from the surrounding tissue with Metzenbaum scissors. The external oblique was elevated in the spermatic cord was bluntly dissected away from the surrounding tissue.  ? ?The spermatic cord and the hernia were then bluntly dissected away from the pubic tubercle and a Penrose was placed around the hernia sac in the spermatic cord. The vas deferens was identified and protected at all portions of the case. Dissection of the cremasterics took place with Bovie cautery. One the hernia sac was dissected  away from the surrrounding cremesteric tissue , the hernia sac was entered laterally. There was small bowel at the base, there were no sliding components and no femoral hernia palpated. The hernia sac was highly ligated using 0 Vicryl.  The specimen was sent to pathology. This retracted into the abdomen in the usual fashion.  At this time a left-sided Progrip mesh was then anchored to the pubic tubercle with a 2-0 Prolene.  It was anchored to the shelving edge of the external oblique x 1 and the conjoint tendon cephalad x 1.  The wrap around of the mesh was sutured to the conjoint tendon as well.  The new internal ring did not strangulate the spermatic cord.   The tail was then tucked under the external oblique. At this time the area was irrigated out with sterile saline.   ? ?The external oblique was reapproximated using a 2-0 Vicryl in a running fashion. Scarpa's fascia was then reapproximated using a 3-0 Vicryl running fashion. The skin was then reapproximated with 4 Monocryl in a subcuticular fashion. The skin was then dressed with Dermabond.  The patient was taken to the recovery room in stable condition. ? ? ? ? ?PLAN OF CARE: Discharge to home after PACU ? ?PATIENT DISPOSITION:  PACU - hemodynamically stable. ?  ?Delay start of Pharmacological VTE agent (>24hrs) due to surgical blood loss or risk of bleeding: not applicable ? ?

## 2021-07-09 NOTE — Discharge Instructions (Signed)
[facility] _______[facility] ? ?INGUINAL HERNIA REPAIR: POST OP INSTRUCTIONS ? ?Always review your discharge instruction sheet given to you by the facility where your surgery was performed. ?IF YOU HAVE DISABILITY OR FAMILY LEAVE FORMS, YOU MUST BRING THEM TO THE OFFICE FOR PROCESSING.   ?DO NOT GIVE THEM TO YOUR DOCTOR. ? ?1. A  prescription for pain medication may be given to you upon discharge.  Take your pain medication as prescribed, if needed.  If narcotic pain medicine is not needed, then you may take acetaminophen (Tylenol) or ibuprofen (Advil) as needed. ?2. Take your usually prescribed medications unless otherwise directed. ?If you need a refill on your pain medication, please contact your pharmacy.  They will contact our office to request authorization. Prescriptions will not be filled after 5 pm or on week-ends. ?3. You should follow a light diet the first 24 hours after arrival home, such as soup and crackers, etc.  Be sure to include lots of fluids daily.  Resume your normal diet the day after surgery. ?4.Most patients will experience some swelling and bruising around the umbilicus or in the groin and scrotum.  Ice packs and reclining will help.  Swelling and bruising can take several days to resolve.  ?6. It is common to experience some constipation if taking pain medication after surgery.  Increasing fluid intake and taking a stool softener (such as Colace) will usually help or prevent this problem from occurring.  A mild laxative (Milk of Magnesia or Miralax) should be taken according to package directions if there are no bowel movements after 48 hours. ?7. Unless discharge instructions indicate otherwise, you may remove your bandages 24-48 hours after surgery, and you may shower at that time.  You may have steri-strips (small skin tapes) in place directly over the incision.  These strips should be left on the skin for 7-10 days.  If your surgeon used skin glue on the incision, you may  shower in 24 hours.  The glue will flake off over the next 2-3 weeks.  Any sutures or staples will be removed at the office during your follow-up visit. ?8. ACTIVITIES:  You may resume regular (light) daily activities beginning the next day--such as daily self-care, walking, climbing stairs--gradually increasing activities as tolerated.  You may have sexual intercourse when it is comfortable.  Refrain from any heavy lifting or straining until approved by your doctor. ? ?a.You may drive when you are no longer taking prescription pain medication, you can comfortably wear a seatbelt, and you can safely maneuver your car and apply brakes. ?b.RETURN TO WORK:   ?_____________________________________________ ? ?9.You should see your doctor in the office for a follow-up appointment approximately 2-3 weeks after your surgery.  Make sure that you call for this appointment within a day or two after you arrive home to insure a convenient appointment time. ?10.OTHER INSTRUCTIONS: _________________________ ?   _____________________________________ ? ?WHEN TO CALL YOUR DOCTOR: ?Fever over 101.0 ?Inability to urinate ?Nausea and/or vomiting ?Extreme swelling or bruising ?Continued bleeding from incision. ?Increased pain, redness, or drainage from the incision ? ?The clinic staff is available to answer your questions during regular business hours.  Please don?t hesitate to call and ask to speak to one of the nurses for clinical concerns.  If you have a medical emergency, go to the nearest emergency room or call 911.  A surgeon from [facility] is always on call at the hospital ? ? ?[address], [address], [place], [place]    [address] ? ? [address] [address], [place], [place]   [address] ?[phone] ? [phone] ? FAX [phone] ?Web site: [email or web address] ? ?

## 2021-07-09 NOTE — Transfer of Care (Signed)
Immediate Anesthesia Transfer of Care Note ? ?Patient: Melvin [name] ? ?Procedure(s) Performed: OPEN LEFT INGUINAL HERNIA REPAIR WITH MESH (Left: Inguinal) ?INSERTION OF MESH (Left: Inguinal) ? ?Patient Location: PACU ? ?Anesthesia Type:General ? ?Level of Consciousness: drowsy, patient cooperative and responds to stimulation ? ?Airway & Oxygen Therapy: Patient Spontanous Breathing ? ?Post-op Assessment: Report given to RN and Post -op Vital signs reviewed and stable ? ?Post vital signs: Reviewed and stable ? ?Last Vitals:  ?Vitals Value Taken Time  ?BP    ?Temp    ?Pulse    ?Resp    ?SpO2    ? ? ?Last Pain:  ?Vitals:  ? 07/09/21 1209  ?TempSrc: Oral  ?   ? ?  ? ?Complications: No notable events documented. ?

## 2021-07-09 NOTE — Anesthesia Preprocedure Evaluation (Addendum)
Anesthesia Evaluation  ?Patient identified by MRN, date of birth, ID band ?Patient awake ? ? ? ?Reviewed: ?Allergy & Precautions, NPO status , Patient's Chart, lab work & pertinent test results ? ?Airway ?Mallampati: II ? ?TM Distance: >3 FB ?Neck ROM: Full ? ? ? Dental ?no notable dental hx. ?(+) Caps, Teeth Intact, Missing,  ?  ?Pulmonary ?former smoker,  ?  ?Pulmonary exam normal ?breath sounds clear to auscultation ? ? ? ? ? ? Cardiovascular ?Exercise Tolerance: Good ?Normal cardiovascular exam ?Rhythm:Regular Rate:Normal ? ? ?  ?Neuro/Psych ?  ? GI/Hepatic ?Neg liver ROS, GERD  ,  ?Endo/Other  ?negative endocrine ROS ? Renal/GU ?  ? ?  ?Musculoskeletal ? ?(+) Arthritis , Osteoarthritis,   ? Abdominal ?  ?Peds ? Hematology ?Lab Results ?     Component                Value               Date                 ?     HGB                      14.0                07/07/2021           ?     HCT                      43.5                07/07/2021                 ?     PLT                      240                 07/07/2021           ?   ?Anesthesia Other Findings ?All: Sulfa ? Reproductive/Obstetrics ? ?  ? ? ? ? ? ? ? ? ? ? ? ? ? ?  ?  ? ? ? ? ? ? ? ?Anesthesia Physical ?Anesthesia Plan ? ?ASA: 2 ? ?Anesthesia Plan: General  ? ?Post-op Pain Management:   ? ?Induction: Intravenous ? ?PONV Risk Score and Plan: 3 and Treatment may vary due to age or medical condition and Ondansetron ? ?Airway Management Planned: Oral ETT ? ?Additional Equipment: None ? ?Intra-op Plan:  ? ?Post-operative Plan: Extubation in OR ? ?Informed Consent: I have reviewed the patients History and Physical, chart, labs and discussed the procedure including the risks, benefits and alternatives for the proposed anesthesia with the patient or authorized representative who has indicated his/her understanding and acceptance.  ? ? ? ?Dental advisory given ? ?Plan Discussed with:  ? ?Anesthesia Plan Comments:    ? ? ? ? ? ? ?Anesthesia Quick Evaluation ? ?

## 2021-07-09 NOTE — H&P (Signed)
Melvin [name] is an 76 y.o. male.   ?Chief Complaint: left inguinal hernia ?HPI: Pt is a 46 M with a h/o of prostate ca, and prev had undergone a robotic attempt at a left inguinal hernia.  We were unable to fix this robotically due to scar tissue from his prostate surgery.  Pt was not consented for an approach at that time.  I counseled the patient post op and he decided to proceed with open LIHR with mesh. ? ?Past Medical History:  ?Diagnosis Date  ? Arthritis   ? Cancer [facility])   ? ? ?Past Surgical History:  ?Procedure Laterality Date  ? INGUINAL HERNIA REPAIR Right 04/01/2021  ? Procedure: LAPAROSCOPIC INGUINAL HERNIA;  Surgeon: Ralene Ok, MD;  Location: [facility];  Service: General;  Laterality: Right;  ? INSERTION OF MESH Right 04/01/2021  ? Procedure: INSERTION OF MESH;  Surgeon: Ralene Ok, MD;  Location: [facility];  Service: General;  Laterality: Right;  ? LYMPHADENECTOMY Bilateral 09/12/2012  ? Procedure: LYMPHADENECTOMY;  Surgeon: Alexis Frock, MD;  Location: [facility];  Service: Urology;  Laterality: Bilateral;  ? ROBOT ASSISTED LAPAROSCOPIC RADICAL PROSTATECTOMY N/A 09/12/2012  ? Procedure: ROBOTIC ASSISTED LAPAROSCOPIC RADICAL PROSTATECTOMY;  Surgeon: Alexis Frock, MD;  Location: [facility];  Service: Urology;  Laterality: N/A;  ? TONSILLECTOMY    ? ? ?History reviewed. No pertinent family history. ?Social History:  reports that he quit smoking about 28 years ago. His smoking use included cigarettes. He has never used smokeless tobacco. He reports that he does not currently use alcohol. He reports that he does not use drugs. ? ?Allergies:  ?Allergies  ?Allergen Reactions  ? Sulfa Antibiotics Hives  ? ? ?Medications Prior to Admission  ?Medication Sig Dispense Refill  ? b complex vitamins capsule Take 1 capsule by mouth 4 (four) times a week.    ? cimetidine (TAGAMET) 200 MG tablet Take 200 mg by mouth 2 (two) times daily.    ? dorzolamidel-timolol (COSOPT PF) 22.3-6.8 MG/ML SOLN ophthalmic solution Place 1  drop into both eyes 2 (two) times daily.    ? ibuprofen (ADVIL) 200 MG tablet Take 200 mg by mouth every 8 (eight) hours as needed (back pain.).    ? linaclotide (LINZESS) 290 MCG CAPS capsule Take 290 mcg by mouth daily as needed (constipation).    ? Multiple Vitamin (MULTIVITAMIN WITH MINERALS) TABS tablet Take 1 tablet by mouth daily with lunch.    ? Tafluprost, PF, (ZIOPTAN) 0.0015 % SOLN Place 1 drop into both eyes at bedtime.    ? diclofenac (VOLTAREN) 75 MG EC tablet Take 1 tablet (75 mg total) by mouth 2 (two) times daily. (Patient not taking: Reported on 07/01/2021) 60 tablet 0  ? oxyCODONE-acetaminophen (PERCOCET) 5-325 MG tablet Take 1 tablet by mouth every 4 (four) hours as needed for severe pain. (Patient not taking: Reported on 07/01/2021) 20 tablet 0  ? ? ?No results found for this or any previous visit (from the past 48 hour(s)). ?No results found. ? ?Review of Systems  ?Constitutional:  Negative for chills and fever.  ?HENT:  Negative for ear discharge, hearing loss and sore throat.   ?Eyes:  Negative for discharge.  ?Respiratory:  Negative for cough and shortness of breath.   ?Cardiovascular:  Negative for chest pain and leg swelling.  ?Gastrointestinal:  Negative for abdominal pain, constipation, diarrhea, nausea and vomiting.  ?Musculoskeletal:  Negative for myalgias and neck pain.  ?Skin:  Negative for rash.  ?Allergic/Immunologic: Negative for environmental allergies.  ?  Neurological:  Negative for dizziness and seizures.  ?Hematological:  Does not bruise/bleed easily.  ?Psychiatric/Behavioral:  Negative for suicidal ideas.   ?All other systems reviewed and are negative. ? ?Blood pressure (!) 177/89, pulse 81, temperature 98.6 ?F (37 ?C), temperature source Oral, resp. rate 18, height '6\' 1"'$  (1.854 m), weight 77.1 kg, SpO2 99 %. ?Physical Exam ?Constitutional:   ?   Appearance: He is well-developed.  ?   Comments: Conversant ?No acute distress  ?HENT:  ?   Head: Normocephalic and atraumatic.  ?Eyes:   ?   General: Lids are normal. No scleral icterus. ?   Pupils: Pupils are equal, round, and reactive to light.  ?   Comments: Pupils are equal round and reactive ?No lid lag ?Moist conjunctiva  ?Neck:  ?   Thyroid: No thyromegaly.  ?   Trachea: No tracheal tenderness.  ?   Comments: No cervical lymphadenopathy ?Cardiovascular:  ?   Rate and Rhythm: Normal rate and regular rhythm.  ?   Heart sounds: No murmur heard. ?Pulmonary:  ?   Effort: Pulmonary effort is normal.  ?   Breath sounds: Normal breath sounds. No wheezing or rales.  ?Abdominal:  ?   Tenderness: There is no abdominal tenderness.  ?   Hernia: A hernia is present. Hernia is present in the left inguinal area.  ?Musculoskeletal:  ?   Cervical back: Normal range of motion and neck supple.  ?Skin: ?   General: Skin is warm.  ?   Findings: No rash.  ?   Nails: There is no clubbing.  ?   Comments: Normal skin turgor  ?Neurological:  ?   Mental Status: He is alert and oriented to person, place, and time.  ?   Comments: Normal gait and station  ?Psychiatric:     ?   Mood and Affect: Mood normal.     ?   Thought Content: Thought content normal.     ?   Judgment: Judgment normal.  ?   Comments: Appropriate affect  ?  ? ?Assessment/Plan ?75M with LIH ?Will proceed with open LIHR with mesh ?All risks and benefits were discussed with the patient, to generally include infection, bleeding, damage to surrounding structures, acute and chronic nerve pain, and recurrence. Alternatives were offered and described.  All questions were answered and the patient voiced understanding of the procedure and wishes to proceed at this point. ? ? ?Ralene Ok, MD ?07/09/2021, 1:04 PM ? ? ? ?

## 2021-07-09 NOTE — Anesthesia Procedure Notes (Signed)
Procedure Name: Intubation ?Date/Time: 07/09/2021 1:25 PM ?Performed by: Leonor Liv, CRNA ?Pre-anesthesia Checklist: Patient identified, Emergency Drugs available, Suction available and Patient being monitored ?Patient Re-evaluated:Patient Re-evaluated prior to induction ?Oxygen Delivery Method: Circle System Utilized ?Preoxygenation: Pre-oxygenation with 100% oxygen ?Induction Type: IV induction ?Ventilation: Mask ventilation without difficulty ?Tube type: Oral ?Number of attempts: 1 ?Airway Equipment and Method: Stylet and Oral airway ?Placement Confirmation: ETT inserted through vocal cords under direct vision, positive ETCO2 and breath sounds checked- equal and bilateral ?Tube secured with: Tape ?Dental Injury: Teeth and Oropharynx as per pre-operative assessment  ?Comments: Boykin Peek [facility]  ? ? ? ? ?

## 2021-07-12 ENCOUNTER — Encounter (HOSPITAL_COMMUNITY): Payer: Self-pay | Admitting: General Surgery

## 2021-07-28 ENCOUNTER — Ambulatory Visit (INDEPENDENT_AMBULATORY_CARE_PROVIDER_SITE_OTHER): Payer: BC Managed Care – PPO

## 2021-07-28 ENCOUNTER — Other Ambulatory Visit: Payer: Self-pay | Admitting: Internal Medicine

## 2021-07-28 ENCOUNTER — Telehealth: Payer: Self-pay | Admitting: Internal Medicine

## 2021-07-28 DIAGNOSIS — T189XXA Foreign body of alimentary tract, part unspecified, initial encounter: Secondary | ICD-10-CM

## 2021-07-28 NOTE — Telephone Encounter (Signed)
Pt wife called in wanting to know the results of his xray. I advise the note from Dr. Quay Burow stated Foreign body is in the stomach-no evidence of anything concerning in the chest.  This confirms that he did swallow it.  Per Dr. Satira Sark this should pass and he should not have any complications.  Blood counts still pending, but I expect them to be normal. ? ?Pt then got a call from Dr. Satira Sark office and we disconnected the call. ? ?Pt wife understood and had no further questions ? ?FYI ?

## 2021-07-28 NOTE — Telephone Encounter (Signed)
Foreign body is in the stomach-no evidence of anything concerning in the chest.  This confirms that he did swallow it.  Per Dr. Satira Sark this should pass and he should not have any complications.  Blood counts still pending, but I expect them to be normal. ? ?Please call patient and Dr. Satira Sark to inform them of the above. ?

## 2021-07-28 NOTE — Addendum Note (Signed)
Addended by: [name] on: 07/28/2021 10:55 AM ? ? Modules accepted: Orders ? ?

## 2021-07-29 ENCOUNTER — Telehealth: Payer: Self-pay | Admitting: Internal Medicine

## 2021-07-29 ENCOUNTER — Ambulatory Visit (INDEPENDENT_AMBULATORY_CARE_PROVIDER_SITE_OTHER): Payer: BC Managed Care – PPO

## 2021-07-29 DIAGNOSIS — T189XXA Foreign body of alimentary tract, part unspecified, initial encounter: Secondary | ICD-10-CM

## 2021-07-29 NOTE — Telephone Encounter (Signed)
Spoke with patient and info given. ? ?He understands to come by office today and tomorrow. ? ?

## 2021-07-29 NOTE — Telephone Encounter (Signed)
Please call - Dr Satira Sark recommends daily xrays to follow progression of foreign body -- see if he can come in today and tomorrow - want to make sure it is passing ?

## 2021-07-30 ENCOUNTER — Ambulatory Visit (INDEPENDENT_AMBULATORY_CARE_PROVIDER_SITE_OTHER): Payer: BC Managed Care – PPO

## 2021-07-30 DIAGNOSIS — T189XXA Foreign body of alimentary tract, part unspecified, initial encounter: Secondary | ICD-10-CM | POA: Diagnosis not present

## 2021-07-30 NOTE — Telephone Encounter (Signed)
Spoke with patient today. 

## 2021-08-02 ENCOUNTER — Telehealth: Payer: Self-pay | Admitting: Internal Medicine

## 2021-08-02 ENCOUNTER — Ambulatory Visit (INDEPENDENT_AMBULATORY_CARE_PROVIDER_SITE_OTHER): Payer: BC Managed Care – PPO

## 2021-08-02 DIAGNOSIS — T189XXD Foreign body of alimentary tract, part unspecified, subsequent encounter: Secondary | ICD-10-CM | POA: Diagnosis not present

## 2021-08-02 NOTE — Telephone Encounter (Signed)
Xray shows two areas of possible foreign body which does not make a lot of sense.  Lets check another xray today  - ordered. Kub ? ?

## 2021-08-02 NOTE — Telephone Encounter (Signed)
Spoke with patient today. 

## 2021-08-02 NOTE — Telephone Encounter (Signed)
Call him -- looks like the piece has split into two - both pieces are located in the lower abdomen.  Lets check another xray tomorrow.  If he has any abdominal pain or other concerning symptoms he needs to call.   ? ?Xray ordered.   ? ? ?Please have him or someone update Dr  Satira Sark.  ?

## 2021-08-02 NOTE — Telephone Encounter (Signed)
Patient will need to get custom print out of expenses ?

## 2021-08-02 NOTE — Telephone Encounter (Signed)
Results given to patient today. Spoke with Danielle from Dr. Mellissa Kohut office . ? ?Fax # [phone]. ? ?Faxed copy of most recent xray today. ? ?They are covering all medical expenses related to patient's care and foreign object. ?

## 2021-08-03 ENCOUNTER — Encounter: Payer: Self-pay | Admitting: Internal Medicine

## 2021-08-03 ENCOUNTER — Ambulatory Visit (INDEPENDENT_AMBULATORY_CARE_PROVIDER_SITE_OTHER): Payer: BC Managed Care – PPO

## 2021-08-03 ENCOUNTER — Other Ambulatory Visit: Payer: Self-pay | Admitting: Internal Medicine

## 2021-08-03 DIAGNOSIS — T189XXA Foreign body of alimentary tract, part unspecified, initial encounter: Secondary | ICD-10-CM

## 2021-08-03 DIAGNOSIS — T189XXD Foreign body of alimentary tract, part unspecified, subsequent encounter: Secondary | ICD-10-CM

## 2021-08-04 ENCOUNTER — Encounter: Payer: Self-pay | Admitting: Internal Medicine

## 2021-08-04 DIAGNOSIS — T189XXA Foreign body of alimentary tract, part unspecified, initial encounter: Secondary | ICD-10-CM

## 2021-08-05 ENCOUNTER — Ambulatory Visit (INDEPENDENT_AMBULATORY_CARE_PROVIDER_SITE_OTHER): Payer: BC Managed Care – PPO

## 2021-08-05 DIAGNOSIS — T189XXD Foreign body of alimentary tract, part unspecified, subsequent encounter: Secondary | ICD-10-CM

## 2021-08-10 ENCOUNTER — Ambulatory Visit (INDEPENDENT_AMBULATORY_CARE_PROVIDER_SITE_OTHER): Payer: BC Managed Care – PPO

## 2021-08-10 DIAGNOSIS — T189XXD Foreign body of alimentary tract, part unspecified, subsequent encounter: Secondary | ICD-10-CM | POA: Diagnosis not present

## 2021-08-12 ENCOUNTER — Telehealth: Payer: Self-pay | Admitting: Internal Medicine

## 2021-08-12 NOTE — Telephone Encounter (Signed)
Angie with [facility] calls today in regards to a referral sent to them. The original referral was in regards to a foreign body in PT. Imagine received by [facility] states that the foreign body is no longer identifiable. [facility] was also not sure what the foreign body was as they were not notified. Ultimately they are wanting to know if they still need to make an appointment with the patient? ? ?CB: [phone]  ?

## 2021-08-13 NOTE — Telephone Encounter (Signed)
Left message for Angie that appointment was no longer needed since patient passed foreign body. ?

## 2021-09-10 ENCOUNTER — Other Ambulatory Visit: Payer: Self-pay | Admitting: Family Medicine

## 2021-09-10 NOTE — Telephone Encounter (Signed)
Sent patient message to see if he is still using medication. ?

## 2021-09-15 ENCOUNTER — Other Ambulatory Visit: Payer: Self-pay | Admitting: Internal Medicine

## 2021-09-15 DIAGNOSIS — L309 Dermatitis, unspecified: Secondary | ICD-10-CM

## 2021-10-04 DIAGNOSIS — H18523 Epithelial (juvenile) corneal dystrophy, bilateral: Secondary | ICD-10-CM | POA: Insufficient documentation

## 2021-10-04 DIAGNOSIS — H04129 Dry eye syndrome of unspecified lacrimal gland: Secondary | ICD-10-CM | POA: Insufficient documentation

## 2021-10-07 NOTE — Progress Notes (Unsigned)
Melvin [name] Melvin [name] Melvin [name] Melvin [name] Phone: [phone] Subjective:   [name], am serving as a scribe for Dr. Hulan Saas.   I'm seeing this patient by the request  of:  Melvin Rail, MD  CC: back pain follow up   BJS:[id number]  06/29/2021 Spent greater than 31 minutes talking to patient as well as reviewing his chart.  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  This is a chronic problem with exacerbation.  Given diclofenac and warned of potential side effects but using them in bursts 2 times a day for 5 days at a time could be beneficial.  Patient feels that certain medications such as gabapentin, Cymbalta were not helpful and he would like to avoid them.  Follow-up with me again in 6 to 8 weeks  Updated 10/12/2021 Melvin [name] is a 76 y.o. male coming in with complaint of back pain. Does feel like Diclofenac did help. Pain is constant but just trying to manage pain. Wants to know what is out there that is stronger, what he can do at the gym and if he can get a refill of the diclofenac.   Had hernia surgeries and wants to know what is contraindicated during his workouts.        Past Medical History:  Diagnosis Date   Arthritis    Cancer [facility])    Past Surgical History:  Procedure Laterality Date   INGUINAL HERNIA REPAIR Right 04/01/2021   Procedure: LAPAROSCOPIC INGUINAL HERNIA;  Surgeon: Melvin Ok, MD;  Location: [place];  Service: General;  Laterality: Right;   INGUINAL HERNIA REPAIR Left 07/09/2021   Procedure: OPEN LEFT INGUINAL HERNIA REPAIR WITH MESH;  Surgeon: Melvin Ok, MD;  Location: [place];  Service: General;  Laterality: Left;   INSERTION OF MESH Right 04/01/2021   Procedure: INSERTION OF MESH;  Surgeon: Melvin Ok, MD;  Location: [place];  Service: General;  Laterality:  Right;   INSERTION OF MESH Left 07/09/2021   Procedure: INSERTION OF MESH;  Surgeon: Melvin Ok, MD;  Location: [place];  Service: General;  Laterality: Left;   LYMPHADENECTOMY Bilateral 09/12/2012   Procedure: LYMPHADENECTOMY;  Surgeon: Alexis Frock, MD;  Location: [facility];  Service: Urology;  Laterality: Bilateral;   ROBOT ASSISTED LAPAROSCOPIC RADICAL PROSTATECTOMY N/A 09/12/2012   Procedure: ROBOTIC ASSISTED LAPAROSCOPIC RADICAL PROSTATECTOMY;  Surgeon: Alexis Frock, MD;  Location[facility];  Service: Urology;  Laterality: N/A;   TONSILLECTOMY     Social History   Socioeconomic History   Marital status: Married    Spouse name: Not on file   Number of children: Not on file   Years of education: Not on file   Highest education level: Not on file  Occupational History   Not on file  Tobacco Use   Smoking status: Former    Types: Cigarettes    Quit date: 09/12/1992    Years since quitting: 29.1   Smokeless tobacco: Never  Vaping Use   Vaping Use: Never used  Substance and Sexual Activity   Alcohol use: Not Currently   Drug use: Never   Sexual activity: Not on file  Other Topics Concern   Not on file  Social History Narrative   Not on file   Social Determinants of Health   Financial Resource Strain: Not on file  Food Insecurity: Not on file  Transportation Needs: Not  on file  Physical Activity: Not on file  Stress: Not on file  Social Connections: Not on file   Allergies  Allergen Reactions   Sulfa Antibiotics Hives   No family history on file.  Current Outpatient Medications (Endocrine & Metabolic):    predniSONE (DELTASONE) 20 MG tablet, Take 2 tablets (40 mg total) by mouth daily with breakfast.    Current Outpatient Medications (Analgesics):    diclofenac (VOLTAREN) 75 MG EC tablet, Take 1 tablet (75 mg total) by mouth 2 (two) times daily.   ibuprofen (ADVIL) 200 MG tablet, Take 200 mg by mouth every 8 (eight) hours as needed (back pain.).    oxyCODONE-acetaminophen (PERCOCET) 5-325 MG tablet, Take 1 tablet by mouth every 4 (four) hours as needed for severe pain.   traMADol (ULTRAM) 50 MG tablet, Take 1 tablet (50 mg total) by mouth every 6 (six) hours as needed.   Current Outpatient Medications (Other):    b complex vitamins capsule, Take 1 capsule by mouth 4 (four) times a week.   cimetidine (TAGAMET) 200 MG tablet, Take 200 mg by mouth 2 (two) times daily.   dorzolamidel-timolol (COSOPT PF) 22.3-6.8 MG/ML SOLN ophthalmic solution, Place 1 drop into both eyes 2 (two) times daily.   fluocinonide cream (LIDEX) 0.05 %, APPLY TO THE AFFECTED AREA ONCE A DAY.   linaclotide (LINZESS) 290 MCG CAPS capsule, Take 290 mcg by mouth daily as needed (constipation).   Multiple Vitamin (MULTIVITAMIN WITH MINERALS) TABS tablet, Take 1 tablet by mouth daily with lunch.   Tafluprost, PF, (ZIOPTAN) 0.0015 % SOLN, Place 1 drop into both eyes at bedtime.   Reviewed prior external information including notes and imaging from  primary care provider As well as notes that were available from care everywhere and other healthcare systems.  Past medical history, social, surgical and family history all reviewed in electronic medical record.  No pertanent information unless stated regarding to the chief complaint.   Review of Systems:  No headache, visual changes, nausea, vomiting, diarrhea, constipation, dizziness, abdominal pain, skin rash, fevers, chills, night sweats, weight loss, swollen lymph nodes,  joint swelling, chest pain, shortness of breath, mood changes. POSITIVE muscle aches, body aches  Objective  Blood pressure 138/84, pulse 72, height '6\' 1"'$  (1.854 m), weight 171 lb (77.6 kg), SpO2 97 %.   General: No apparent distress alert and oriented x3 mood and affect normal, dressed appropriately.  HEENT: Pupils equal, extraocular movements intact  Respiratory: Patient's speak in full sentences and does not appear short of breath  Cardiovascular:  No lower extremity edema, non tender, no erythema  Gait antalgic with severe overpronation of the hindfoot bilaterally.  The patient does have significant degenerative scoliosis of the lumbar spine noted.  Patient neurovascular is intact distally.  Patient does not have any hernias felt today on the anterior abdominal wall.   Impression and Recommendations:     The above documentation has been reviewed and is accurate and complete Lyndal Pulley, DO

## 2021-10-12 ENCOUNTER — Ambulatory Visit: Payer: BC Managed Care – PPO | Admitting: Family Medicine

## 2021-10-12 ENCOUNTER — Encounter: Payer: Self-pay | Admitting: Family Medicine

## 2021-10-12 DIAGNOSIS — M48061 Spinal stenosis, lumbar region without neurogenic claudication: Secondary | ICD-10-CM

## 2021-10-12 MED ORDER — PREDNISONE 20 MG PO TABS: 40.0000 mg | ORAL_TABLET | Freq: Every day | ORAL | 0 refills | Status: DC

## 2021-10-12 MED ORDER — DICLOFENAC SODIUM 75 MG PO TBEC
75.0000 mg | DELAYED_RELEASE_TABLET | Freq: Two times a day (BID) | ORAL | 0 refills | Status: DC
Start: 2021-10-12 — End: 2022-02-14

## 2021-10-12 NOTE — Assessment & Plan Note (Addendum)
Discussed with patient.  Patient given different activities that I think could be helpful and different ones to potentially avoid.  Discussed that we should continue to stay active but avoid anything that would make him to hold his breath or any heavy lifting.  Okay to maybe start some machine weights but 5 to 10 pounds total.  Continue with the walking on the treadmill and okay to do recumbent bike.  Total time with patient as well as reviewing patient's previous notes with primary care, laboratory work-up and surgical notes 36 minutes

## 2021-10-12 NOTE — Patient Instructions (Signed)
Ok to increase activity but only machine weights and breath through entire movement Arnica lotion for your wife Refill diclofenac Prednisone '40mg'$  for 5 days -only take if you need it

## 2021-10-14 NOTE — Progress Notes (Signed)
GU Location of Tumor / Histology: Prostate Ca  PSA is (1.76 as of 10/07/2021)  Biopsies of  (if applicable) revealed: NA  Past/Anticipated interventions by urology, if any: {:18581}  Past/Anticipated interventions by medical oncology, if any: NA  Weight changes, if any: {:18581}  IPSS: SHIM:  Bowel/Bladder complaints, if any: {:18581}   Nausea/Vomiting, if any: {:18581}  Pain issues, if any:  {:18581}  SAFETY ISSUES: Prior radiation? {:18581} Pacemaker/ICD? {:18581} Possible current pregnancy? Male Is the patient on methotrexate? No  Current Complaints / other details:   Robotic Assisted Laparoscopic Radical Prostatectomy and bilateral pelvic lymphadenectomy by Dr. Tresa Moore on 09/12/2012.

## 2021-10-18 ENCOUNTER — Ambulatory Visit
Admission: RE | Admit: 2021-10-18 | Discharge: 2021-10-18 | Disposition: A | Discharge status: Home / Self Care | Payer: BC Managed Care – PPO | Source: Ambulatory Visit | Attending: Radiation Oncology | Admitting: Radiation Oncology

## 2021-10-18 VITALS — BP 157/85 | HR 72 | Temp 97.5°F | Resp 20 | Ht 73.0 in | Wt 172.2 lb

## 2021-10-18 DIAGNOSIS — M129 Arthropathy, unspecified: Secondary | ICD-10-CM | POA: Diagnosis not present

## 2021-10-18 DIAGNOSIS — Z791 Long term (current) use of non-steroidal anti-inflammatories (NSAID): Secondary | ICD-10-CM | POA: Insufficient documentation

## 2021-10-18 DIAGNOSIS — C61 Malignant neoplasm of prostate: Secondary | ICD-10-CM | POA: Insufficient documentation

## 2021-10-18 DIAGNOSIS — Z7952 Long term (current) use of systemic steroids: Secondary | ICD-10-CM | POA: Insufficient documentation

## 2021-10-18 DIAGNOSIS — F0390 Unspecified dementia without behavioral disturbance: Secondary | ICD-10-CM | POA: Diagnosis not present

## 2021-10-18 DIAGNOSIS — Z87891 Personal history of nicotine dependence: Secondary | ICD-10-CM | POA: Insufficient documentation

## 2021-10-18 DIAGNOSIS — R9721 Rising PSA following treatment for malignant neoplasm of prostate: Secondary | ICD-10-CM | POA: Diagnosis not present

## 2021-10-18 NOTE — Progress Notes (Signed)
Radiation Oncology         ([phone] ________________________________  Initial Outpatient Consultation  Name: Melvin [name] MRN: [id number]  Date: 10/18/2021  DOB: Nov 05, 1945  [id number], Claudina Lick, MD  Myrlene Broker, MD   REFERRING PHYSICIAN: Myrlene Broker, MD  DIAGNOSIS: 76 y.o. 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    ICD-10-CM   1. Malignant neoplasm of prostate ([place])  C61 NM PET (PSMA) SKULL TO MID THIGH      HISTORY OF PRESENT ILLNESS: Melvin [name] is a 76 y.o. male with a diagnosis of prostate cancer. He underwent prostatectomy and pelvic lymph node dissection Sep 12, 2012.  At that time, he was found to have Gleason 4+3 with tertiary pattern 5 involving both lobes there was no extraprostatic extension, the margins were negative, the seminal vesicles were and the 10 sampled lymph nodes and zero positive.  Preoperatively, the patient's PSA was 10.0 staging with bone scan and CT pelvis revealed no evidence of metastatic disease.  Postoperatively, the patient's PSA was initially undetectable in July 2020 0.23.  0.59.  In August 2020.  Remained stable in February 2020 0.64 and rose again most recently, the patient's PSA was 1.71 his initial surgery and PSA follow-ups through February have been performed by Dr. Tresa Moore [facility].  According to those notes, the patient has been offered radiation treatment versus surveillance versus ADT.  He has declined adjuvant/salvage radiation treatment past due to his progressive dementia more recently, the patient pursued a second opinion with Dr. Lawerance Bach at [facility].  In reviewing available records, Dr. Rosana Hoes felt that the patient should undergo radiation oncology consultation  The patient has kindly been referred today for discussion of potential radiation treatment options.   PREVIOUS RADIATION THERAPY:  No  PAST MEDICAL HISTORY:  Past Medical History:  Diagnosis Date   Arthritis    Cancer [facility])       PAST SURGICAL HISTORY: Past Surgical History:  Procedure Laterality Date   INGUINAL HERNIA REPAIR Right 04/01/2021   Procedure: LAPAROSCOPIC INGUINAL HERNIA;  Surgeon: Ralene Ok, MD;  Location: [place];  Service: General;  Laterality: Right;   INGUINAL HERNIA REPAIR Left 07/09/2021   Procedure: OPEN LEFT INGUINAL HERNIA REPAIR WITH MESH;  Surgeon: Ralene Ok, MD;  Location: [place];  Service: General;  Laterality: Left;   INSERTION OF MESH Right 04/01/2021   Procedure: INSERTION OF MESH;  Surgeon: Ralene Ok, MD;  Location: [place];  Service: General;  Laterality: Right;   INSERTION OF MESH Left 07/09/2021   Procedure: INSERTION OF MESH;  Surgeon: Ralene Ok, MD;  Location: [place];  Service: General;  Laterality: Left;   LYMPHADENECTOMY Bilateral 09/12/2012   Procedure: LYMPHADENECTOMY;  Surgeon: Alexis Frock, MD;  Location[facility];  Service: Urology;  Laterality: Bilateral;   ROBOT ASSISTED LAPAROSCOPIC RADICAL PROSTATECTOMY N/A 09/12/2012   Procedure: ROBOTIC ASSISTED LAPAROSCOPIC RADICAL PROSTATECTOMY;  Surgeon: Alexis Frock, MD;  [facility];  Service: Urology;  Laterality: N/A;   TONSILLECTOMY      FAMILY HISTORY: No family history on file.  SOCIAL HISTORY:  Social History   Socioeconomic History   Marital status: Married    Spouse name: Not on file   Number of children: Not on file   Years of education: Not on file   Highest education level: Not on file  Occupational History  Not on file  Tobacco Use   Smoking status: Former    Types: Cigarettes    Quit date: 09/12/1992    Years since quitting: 29.1   Smokeless tobacco: Never  Vaping Use   Vaping Use: Never used  Substance and Sexual Activity   Alcohol use: Not Currently   Drug use: Never   Sexual activity: Not on file  Other Topics Concern   Not on file  Social History Narrative   Not  on file   Social Determinants of Health   Financial Resource Strain: Not on file  Food Insecurity: Not on file  Transportation Needs: Not on file  Physical Activity: Not on file  Stress: Not on file  Social Connections: Not on file  Intimate Partner Violence: Not on file    ALLERGIES: Sulfa antibiotics  MEDICATIONS:  Current Outpatient Medications  Medication Sig Dispense Refill   b complex vitamins capsule Take 1 capsule by mouth 4 (four) times a week.     cimetidine (TAGAMET) 200 MG tablet Take 200 mg by mouth 2 (two) times daily.     dorzolamidel-timolol (COSOPT PF) 22.3-6.8 MG/ML SOLN ophthalmic solution Place 1 drop into both eyes 2 (two) times daily.     fluocinonide cream (LIDEX) 0.05 % APPLY TO THE AFFECTED AREA ONCE A DAY. 30 g 2   ibuprofen (ADVIL) 200 MG tablet Take 200 mg by mouth every 8 (eight) hours as needed (back pain.).     linaclotide (LINZESS) 290 MCG CAPS capsule Take 290 mcg by mouth daily as needed (constipation).     Multiple Vitamin (MULTIVITAMIN WITH MINERALS) TABS tablet Take 1 tablet by mouth daily with lunch.     Tafluprost, PF, (ZIOPTAN) 0.0015 % SOLN Place 1 drop into both eyes at bedtime.     chlorhexidine (PERIDEX) 0.12 % solution SMARTSIG:By Mouth     diclofenac (VOLTAREN) 75 MG EC tablet Take 1 tablet (75 mg total) by mouth 2 (two) times daily. (Patient not taking: Reported on 10/18/2021) 180 tablet 0   EDEX 20 MCG injection USE ONCE A DAY AS NEEDED.     ketoconazole (NIZORAL) 2 % cream SMARTSIG:1 Topical Daily     oxyCODONE-acetaminophen (PERCOCET) 5-325 MG tablet Take 1 tablet by mouth every 4 (four) hours as needed for severe pain. (Patient not taking: Reported on 10/18/2021) 20 tablet 0   predniSONE (DELTASONE) 20 MG tablet Take 2 tablets (40 mg total) by mouth daily with breakfast. (Patient not taking: Reported on 10/18/2021) 10 tablet 0   tacrolimus (PROTOPIC) 0.1 % ointment Apply 1 Application topically 2 (two) times daily as needed.      traMADol (ULTRAM) 50 MG tablet Take 1 tablet (50 mg total) by mouth every 6 (six) hours as needed. (Patient not taking: Reported on 10/18/2021) 20 tablet 0   No current facility-administered medications for this encounter.    REVIEW OF SYSTEMS:  On review of systems, the patient reports that he is doing well overall. He denies any chest pain, shortness of breath, cough, fevers, chills, night sweats, unintended weight changes. He denies any bowel disturbances, and denies abdominal pain, nausea or vomiting. He denies any new musculoskeletal or joint aches or pains. His IPSS was Total Score: 10, indicating moderate urinary symptoms (Reference 0-7 mild, 8-19 moderate, 20-35 severe).  His SHIM was SHIM: 21, indicating he does have mild erectile dysfunction (Reference - 22-25 None, 17-21 Mild, 8-16 Moderate, 1-7 Severe). A complete review of systems is obtained and is otherwise negative.   PHYSICAL  EXAM:  Wt Readings from Last 3 Encounters:  10/18/21 172 lb 3.2 oz (78.1 kg)  10/12/21 171 lb (77.6 kg)  07/09/21 170 lb (77.1 kg)   Temp Readings from Last 3 Encounters:  10/18/21 (!) 97.5 F (36.4 C) (Temporal)  07/09/21 97.8 F (36.6 C)  07/07/21 97.9 F (36.6 C) (Oral)   BP Readings from Last 3 Encounters:  10/18/21 (!) 157/85  10/12/21 138/84  07/09/21 (!) 158/76   Pulse Readings from Last 3 Encounters:  10/18/21 72  10/12/21 72  07/09/21 (!) 58   Pain Assessment Pain Score: 4  Pain Loc: Back (Lower back scoliosis)/10  In general this is a well appearing gentleman in no acute distress. He's alert and oriented x4 and appropriate throughout the examination. Cardiopulmonary assessment is negative for acute distress, and he exhibits normal effort.    KPS = 90  100 - Normal; no complaints; no evidence of disease. 90   - Able to carry on normal activity; minor signs or symptoms of disease. 80   - Normal activity with effort; some signs or symptoms of disease. 77   - Cares for self; unable  to carry on normal activity or to do active work. 60   - Requires occasional assistance, but is able to care for most of his personal needs. 50   - Requires considerable assistance and frequent medical care. 76   - Disabled; requires special care and assistance. 61   - Severely disabled; hospital admission is indicated although death not imminent. 7   - Very sick; hospital admission necessary; active supportive treatment necessary. 10   - Moribund; fatal processes progressing rapidly. 0     - Dead  Karnofsky DA, Abelmann [place], Craver LS and Burchenal [facility] [phone]) The use of the nitrogen mustards in the palliative treatment of carcinoma: with particular reference to bronchogenic carcinoma Cancer 1 634-56  LABORATORY DATA:  Lab Results  Component Value Date   WBC 7.5 07/07/2021   HGB 14.0 07/07/2021   HCT 43.5 07/07/2021   MCV 101.6 (H) 07/07/2021   PLT 240 07/07/2021   Lab Results  Component Value Date   NA 137 09/05/2012   K 4.5 09/05/2012   CL 102 09/05/2012   CO2 27 09/05/2012   No results found for: "ALT", "AST", "GGT", "ALKPHOS", "BILITOT"   RADIOGRAPHY: No results found.    IMPRESSION/PLAN: 1. 76 y.o. 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  Today I reviewed the findings and workup thus far.  We discussed the patient's workup and outlined the nature of prostate cancer in this setting. The patient's rising detectable PSA represents biochemical failure.  We reviewed the the implications of negative margins, absence of extracapsular extension, and absence of seminal vesicle involvement on the risk of local versus distant prostate cancer recurrence. We reviewed some of the evidence suggesting an advantage for patients who undergo adjuvant radiotherapy in the setting in terms of disease control and overall survival. We also discussed some of the dilemmas related  to the available evidence.  We discussed radiation treatment directed to the prostatic fossa with regard to the logistics and delivery of external beam radiation treatment.   The patient would like to proceed with PSMA PET for re-staging and follow-up on those findings to tailor recommendations based on the findings.  I counseled them that the PET may not show any active disease,  in which case we would recommend consideration for salvage pelvic radiotherapy.  If PET shows prostate fossa activity, we would also recommend pelvic radiotherapy.  In the event that PET reveals oligometastatic disease, we will consider SBRT or Ultrahypofractionated metastasis directed therapy.  If it reveals widespread metastatic disease, we would likely recommend systemic ADT and likely therapy escalation.  I will share my evaluation with Dr. Rosana Hoes and will look forward to following up on the PET.     I personally spent 60 minutes in this encounter including chart review, reviewing radiological studies, meeting face-to-face with the patient, entering orders and completing documentation.    ------------------------------------------------  Sheral Apley. Tammi Klippel, M.D.

## 2021-10-18 NOTE — Progress Notes (Signed)
Introduced myself to the patient as the prostate nurse navigator.  No barriers to care identified at this time.  He is here to discuss his radiation treatment options.  I gave him my business card and asked him to call me with questions or concerns.  Verbalized understanding.  ?

## 2021-10-26 ENCOUNTER — Encounter (HOSPITAL_COMMUNITY)
Admission: RE | Admit: 2021-10-26 | Discharge: 2021-10-26 | Disposition: A | Discharge status: Home / Self Care | Payer: BC Managed Care – PPO | Source: Ambulatory Visit | Attending: Radiation Oncology | Admitting: Radiation Oncology

## 2021-10-26 DIAGNOSIS — C61 Malignant neoplasm of prostate: Secondary | ICD-10-CM | POA: Diagnosis present

## 2021-10-26 MED ORDER — PIFLIFOLASTAT F 18 (PYLARIFY) INJECTION
9.0000 | Freq: Once | INTRAVENOUS | Status: AC
  Administered 2021-10-26: 9.49 via INTRAVENOUS

## 2021-12-02 ENCOUNTER — Telehealth: Payer: Self-pay

## 2021-12-02 NOTE — Telephone Encounter (Signed)
Patient reminded of his 9:30am-12/03/21 in-person appointment w/ Ashlyn Bruning PA-C. I left my extension [phone] in case patient needs anything. Patient verbalized understanding.

## 2021-12-02 NOTE — Progress Notes (Signed)
Radiation Oncology         [phone] ________________________________  Outpatient Re-Consultation  Name: Melvin [name] MRN: [id number]  Date: 12/03/2021  DOB: [birth year]  WU:[id number], Claudina Lick, MD  Myrlene Broker, MD   REFERRING PHYSICIAN: Myrlene Broker, MD  DIAGNOSIS: 76 y.o. gentleman with detectable rising PSA s/p RALP with PLND adenocarcinoma of the prostate, pT2cN0, Gleason 4+3 with tertiary pattern 5 and PSA of 1.76.    ICD-10-CM   1. Malignant neoplasm of prostate ([place])  C61       HISTORY OF PRESENT ILLNESS: Melvin [name] is a 76 y.o. male with a diagnosis of prostate cancer. He underwent prostatectomy and pelvic lymph node dissection on Sep 12, 2012 under the care of Dr. Tresa Moore.  At that time, he was found to have Gleason 4+3 with tertiary pattern 5 involving both lobes. There was no extraprostatic extension, the margins were negative, the seminal vesicles were negative and there was no lymph node involvement in the 10 sampled nodes.  Preoperatively, the patient's PSA was 10.0. Staging with bone scan and CT pelvis revealed no evidence of metastatic disease.  Postoperatively, the patient's PSA was initially undetectable but became detectable in October 2019 at 0.13 and increased to 0.23 in July 2020 and 0.59 in August 2020.  The PSA remained stable in February 2022 at 0.64 but rose again, up to 1.71 in 06/2021. His initial surgery and PSA follow-ups through February 2023 have been performed by Dr. Tresa Moore at [facility].  According to those notes, the patient has been offered radiation treatment versus surveillance versus ADT.  He has declined adjuvant/salvage radiation treatment in the past due to his progressive dementia but more recently, the patient pursued a second opinion with Dr. Lawerance Bach at [facility].  In reviewing available records, a repeat PSA on 10/07/21 was stable elevated at 1.76 and Dr. Rosana Hoes felt that the patient should undergo radiation oncology  consultation.  We met with the patient on 10/18/21 for discussion of potential radiation treatment options and recommended PSMA PET scan to r/o oligometastatic disease prior to proceeding with salvage pelvic radiotherapy.  The PSMA PET was performed on 10/26/21 and was without radiographic evidence of recurrent or metastatic prostate carcinoma. He presents back to the clinic today to review the results of the PSMA PET scan and further discussion of the recommendation for pelvic radiation to the prostate fossa and pelvic lymph nodes.  PREVIOUS RADIATION THERAPY: No  PAST MEDICAL HISTORY:  Past Medical History:  Diagnosis Date   Arthritis    Cancer [facility])       PAST SURGICAL HISTORY: Past Surgical History:  Procedure Laterality Date   INGUINAL HERNIA REPAIR Right 04/01/2021   Procedure: LAPAROSCOPIC INGUINAL HERNIA;  Surgeon: Ralene Ok, MD;  Location: [place];  Service: General;  Laterality: Right;   INGUINAL HERNIA REPAIR Left 07/09/2021   Procedure: OPEN LEFT INGUINAL HERNIA REPAIR WITH MESH;  Surgeon: Ralene Ok, MD;  Location: [place];  Service: General;  Laterality: Left;   INSERTION OF MESH Right 04/01/2021   Procedure: INSERTION OF MESH;  Surgeon: Ralene Ok, MD;  Location: [place];  Service: General;  Laterality: Right;   INSERTION OF MESH Left 07/09/2021   Procedure: INSERTION OF MESH;  Surgeon: Ralene Ok, MD;  Location: [place];  Service: General;  Laterality: Left;   LYMPHADENECTOMY Bilateral 09/12/2012   Procedure: LYMPHADENECTOMY;  Surgeon: Alexis Frock, MD;  Location: [facility];  Service: Urology;  Laterality: Bilateral;   ROBOT  ASSISTED LAPAROSCOPIC RADICAL PROSTATECTOMY N/A 09/12/2012   Procedure: ROBOTIC ASSISTED LAPAROSCOPIC RADICAL PROSTATECTOMY;  Surgeon: Alexis Frock, MD;  Location: [facility];  Service: Urology;  Laterality: N/A;   TONSILLECTOMY      FAMILY HISTORY: No family history on file.  SOCIAL HISTORY:  Social History   Socioeconomic History    Marital status: Married    Spouse name: Not on file   Number of children: Not on file   Years of education: Not on file   Highest education level: Not on file  Occupational History   Not on file  Tobacco Use   Smoking status: Former    Types: Cigarettes    Quit date: 09/12/1992    Years since quitting: 29.2   Smokeless tobacco: Never  Vaping Use   Vaping Use: Never used  Substance and Sexual Activity   Alcohol use: Not Currently   Drug use: Never   Sexual activity: Not on file  Other Topics Concern   Not on file  Social History Narrative   Not on file   Social Determinants of Health   Financial Resource Strain: Not on file  Food Insecurity: Not on file  Transportation Needs: Not on file  Physical Activity: Not on file  Stress: Not on file  Social Connections: Not on file  Intimate Partner Violence: Not on file    ALLERGIES: Sulfa antibiotics  MEDICATIONS:  Current Outpatient Medications  Medication Sig Dispense Refill   b complex vitamins capsule Take 1 capsule by mouth 4 (four) times a week.     chlorhexidine (PERIDEX) 0.12 % solution SMARTSIG:By Mouth     cimetidine (TAGAMET) 200 MG tablet Take 200 mg by mouth 2 (two) times daily.     diclofenac (VOLTAREN) 75 MG EC tablet Take 1 tablet (75 mg total) by mouth 2 (two) times daily. (Patient not taking: Reported on 10/18/2021) 180 tablet 0   dorzolamidel-timolol (COSOPT PF) 22.3-6.8 MG/ML SOLN ophthalmic solution Place 1 drop into both eyes 2 (two) times daily.     EDEX 20 MCG injection USE ONCE A DAY AS NEEDED.     fluocinonide cream (LIDEX) 0.05 % APPLY TO THE AFFECTED AREA ONCE A DAY. 30 g 2   ibuprofen (ADVIL) 200 MG tablet Take 200 mg by mouth every 8 (eight) hours as needed (back pain.).     ketoconazole (NIZORAL) 2 % cream SMARTSIG:1 Topical Daily     linaclotide (LINZESS) 290 MCG CAPS capsule Take 290 mcg by mouth daily as needed (constipation).     Multiple Vitamin (MULTIVITAMIN WITH MINERALS) TABS tablet Take 1  tablet by mouth daily with lunch.     oxyCODONE-acetaminophen (PERCOCET) 5-325 MG tablet Take 1 tablet by mouth every 4 (four) hours as needed for severe pain. (Patient not taking: Reported on 10/18/2021) 20 tablet 0   predniSONE (DELTASONE) 20 MG tablet Take 2 tablets (40 mg total) by mouth daily with breakfast. (Patient not taking: Reported on 10/18/2021) 10 tablet 0   tacrolimus (PROTOPIC) 0.1 % ointment Apply 1 Application topically 2 (two) times daily as needed.     Tafluprost, PF, (ZIOPTAN) 0.0015 % SOLN Place 1 drop into both eyes at bedtime.     traMADol (ULTRAM) 50 MG tablet Take 1 tablet (50 mg total) by mouth every 6 (six) hours as needed. (Patient not taking: Reported on 10/18/2021) 20 tablet 0   No current facility-administered medications for this visit.    REVIEW OF SYSTEMS:  On review of systems, the patient reports that he  is doing well overall. He denies any chest pain, shortness of breath, cough, fevers, chills, night sweats, unintended weight changes. He denies any bowel disturbances, and denies abdominal pain, nausea or vomiting. He denies any new musculoskeletal or joint aches or pains. His IPSS was 10, indicating moderate urinary symptoms.  His SHIM was 21, indicating he does have mild erectile dysfunction. A complete review of systems is obtained and is otherwise negative.   PHYSICAL EXAM:  Wt Readings from Last 3 Encounters:  10/18/21 172 lb 3.2 oz (78.1 kg)  10/12/21 171 lb (77.6 kg)  07/09/21 170 lb (77.1 kg)   Temp Readings from Last 3 Encounters:  10/18/21 (!) 97.5 F (36.4 C) (Temporal)  07/09/21 97.8 F (36.6 C)  07/07/21 97.9 F (36.6 C) (Oral)   BP Readings from Last 3 Encounters:  10/18/21 (!) 157/85  10/12/21 138/84  07/09/21 (!) 158/76   Pulse Readings from Last 3 Encounters:  10/18/21 72  10/12/21 72  07/09/21 (!) 58    /10  In general this is a well appearing Caucasian male in no acute distress. He's alert and oriented x4 and appropriate  throughout the examination. Cardiopulmonary assessment is negative for acute distress, and he exhibits normal effort.    KPS = 90  100 - Normal; no complaints; no evidence of disease. 90   - Able to carry on normal activity; minor signs or symptoms of disease. 80   - Normal activity with effort; some signs or symptoms of disease. 10   - Cares for self; unable to carry on normal activity or to do active work. 60   - Requires occasional assistance, but is able to care for most of his personal needs. 50   - Requires considerable assistance and frequent medical care. 32   - Disabled; requires special care and assistance. 55   - Severely disabled; hospital admission is indicated although death not imminent. 21   - Very sick; hospital admission necessary; active supportive treatment necessary. 10   - Moribund; fatal processes progressing rapidly. 0     - Dead  Karnofsky DA, Abelmann Rio, Craver LS and [facility] [phone]) The use of the nitrogen mustards in the palliative treatment of carcinoma: with particular reference to bronchogenic carcinoma Cancer 1 634-56  LABORATORY DATA:  Lab Results  Component Value Date   WBC 7.5 07/07/2021   HGB 14.0 07/07/2021   HCT 43.5 07/07/2021   MCV 101.6 (H) 07/07/2021   PLT 240 07/07/2021   Lab Results  Component Value Date   NA 137 09/05/2012   K 4.5 09/05/2012   CL 102 09/05/2012   CO2 27 09/05/2012   No results found for: "ALT", "AST", "GGT", "ALKPHOS", "BILITOT"   RADIOGRAPHY: No results found.    IMPRESSION/PLAN: 1. 76 y.o. gentleman with detectable rising PSA s/p RALP with PLND adenocarcinoma of the prostate, pT2cN0, Gleason 4+3 with tertiary pattern 5 and PSA of 1.71.  Today, we reviewed the PSMA PET results which shows no measurable recurrent or metastatic disease.  We also reviewed that the patient's rising detectable PSA represents biochemical failure. We reviewed some of the evidence suggesting an advantage for patients who undergo  salvage radiotherapy in this setting in terms of disease control and overall survival. We also discussed some of the dilemmas related to the available evidence.  We discussed radiation treatment directed to the prostatic fossa with regard to the logistics and delivery of external beam radiation treatment. We discussed and outlined the risks, benefits, short and  long-term effects associated with radiotherapy. He appears to have a good understanding of his disease and our treatment recommendations which are of curative intent.  He and his wife were encouraged to ask questions that were answered to their stated satisfaction.  At the conclusion of our conversation, the patient is interested in moving forward with 7.5 weeks of salvage external beam therapy.  He has freely signed written consent to proceed today in the office and a copy of this document will be placed in his medical record.  He is tentatively scheduled for CT simulation/treatment planning on 12/14/2021 so we will share our discussion with Dr. Tresa Moore and Dr. Rosana Hoes and move forward with treatment planning accordingly, in anticipation of beginning IMRT in the near future. We enjoyed meeting with him and his wife again today and look forward to continuing to participate in his care.    We personally spent 70 minutes in this encounter including chart review, reviewing radiological studies, meeting face-to-face with the patient, entering orders and completing documentation.    Nicholos Johns, PA-C    Tyler Pita, MD  [facility] Direct Dial: [phone]  Fax: [phone] [email or web address]  Skype  LinkedIn

## 2021-12-03 ENCOUNTER — Encounter: Payer: Self-pay | Admitting: Radiation Oncology

## 2021-12-03 ENCOUNTER — Ambulatory Visit: Payer: BC Managed Care – PPO | Admitting: Urology

## 2021-12-03 ENCOUNTER — Ambulatory Visit
Admission: RE | Admit: 2021-12-03 | Discharge: 2021-12-03 | Disposition: A | Discharge status: Home / Self Care | Payer: BC Managed Care – PPO | Source: Ambulatory Visit | Attending: Radiation Oncology | Admitting: Radiation Oncology

## 2021-12-03 ENCOUNTER — Ambulatory Visit
Admission: RE | Admit: 2021-12-03 | Discharge: 2021-12-03 | Disposition: A | Discharge status: Home / Self Care | Payer: BC Managed Care – PPO | Source: Ambulatory Visit | Attending: Urology | Admitting: Urology

## 2021-12-03 ENCOUNTER — Other Ambulatory Visit: Payer: Self-pay

## 2021-12-03 VITALS — BP 139/75 | HR 72 | Temp 98.2°F | Resp 18 | Ht 73.0 in | Wt 168.2 lb

## 2021-12-03 DIAGNOSIS — C61 Malignant neoplasm of prostate: Secondary | ICD-10-CM | POA: Insufficient documentation

## 2021-12-03 DIAGNOSIS — Z87891 Personal history of nicotine dependence: Secondary | ICD-10-CM | POA: Insufficient documentation

## 2021-12-03 DIAGNOSIS — M129 Arthropathy, unspecified: Secondary | ICD-10-CM | POA: Diagnosis not present

## 2021-12-03 DIAGNOSIS — F0390 Unspecified dementia without behavioral disturbance: Secondary | ICD-10-CM | POA: Insufficient documentation

## 2021-12-03 NOTE — Progress Notes (Signed)
F.U.N appointment. I verified patient's identity and began nursing interview w/ spouse Mrs. Dibenedetto,Maria in attendance. Patient reports dorsalgia 4/10 on occasion. No prostate related issues reported at this time.  Meaningful use complete. No urinary management medications. Urology appt-Dec 21, 2021.  BP 139/75 (BP Location: Left Arm, Patient Position: Sitting, Cuff Size: Normal)   Pulse 72   Temp 98.2 F (36.8 C) (Oral)   Resp 18   Ht '6\' 1"'$  (1.854 m)   Wt 168 lb 4 oz (76.3 kg)   SpO2 100%   BMI 22.20 kg/m

## 2021-12-05 NOTE — Addendum Note (Signed)
Encounter addended by: Tyler Pita, MD on: 12/05/2021 3:49 PM  Actions taken: Problem List reviewed, Medication List reviewed, Allergies reviewed

## 2021-12-09 ENCOUNTER — Telehealth: Payer: Self-pay | Admitting: Internal Medicine

## 2021-12-09 NOTE — Telephone Encounter (Signed)
   Vallie Teters DOB: 10/09/1945 MRN: [id number]   RIDER WAIVER AND RELEASE OF LIABILITY  For purposes of improving physical access to our facilities, [facility] is pleased to partner with third parties to provide [place] patients or other authorized individuals the option of convenient, on-demand ground transportation services (the [place]") through use of the technology service that enables users to request on-demand ground transportation from independent third-party providers.  By opting to use and accept these [employer], I, the undersigned, hereby agree on behalf of myself, and on behalf of any minor child using the Government social research officer for whom I am the parent or legal guardian, as follows:  Government social research officer provided to me are provided by independent third-party transportation providers who are not [employer] or employees and who are unaffiliated with [employer]. [place] is neither a transportation carrier nor a common or public carrier. [facility] has no control over the quality or safety of the transportation that occurs as a result of the [employer]. [facility] cannot guarantee that any third-party transportation provider will complete any arranged transportation service. [facility] makes no representation, warranty, or guarantee regarding the reliability, timeliness, quality, safety, suitability, or availability of any of the Transport Services or that they will be error free. I fully understand that traveling by vehicle involves risks and dangers of serious bodily injury, including permanent disability, paralysis, and death. I agree, on behalf of myself and on behalf of any minor child using the Transport Services for whom I am the parent or legal guardian, that the entire risk arising out of my use of the [employer] remains solely with me, to the maximum extent permitted under applicable law. The [employer] are provided "as is"  and "as available." [place] disclaims all representations and warranties, express, implied or statutory, not expressly set out in these terms, including the implied warranties of merchantability and fitness for a particular purpose. I hereby waive and release [facility], its agents, employees, officers, directors, representatives, insurers, attorneys, assigns, successors, subsidiaries, and affiliates from any and all past, present, or future claims, demands, liabilities, actions, causes of action, or suits of any kind directly or indirectly arising from acceptance and use of the [employer]. I further waive and release [facility] and its affiliates from all present and future liability and responsibility for any injury or death to persons or damages to property caused by or related to the use of the [employer]. I have read this Waiver and Release of Liability, and I understand the terms used in it and their legal significance. This Waiver is freely and voluntarily given with the understanding that my right (as well as the right of any minor child for whom I am the parent or legal guardian using the [employer]) to legal recourse against  in connection with the [employer] is knowingly surrendered in return for use of these services.   I attest that I read the consent document to Judithann Graves, gave Mr. Corlew the opportunity to ask questions and answered the questions asked (if any). I affirm that Denman Pichardo then provided consent for he's participation in this program.     Darrick Meigs V[place]aint

## 2021-12-13 NOTE — Progress Notes (Signed)
  Radiation Oncology         [phone] ________________________________  Name: Melvin [name] MRN: [id number]  Date: 12/14/2021  DOB: 1946/04/03  SIMULATION AND TREATMENT PLANNING NOTE    ICD-10-CM   1. Biochemically recurrent malignant neoplasm of prostate [facility])  C61    R97.21       DIAGNOSIS:  76 y.o. gentleman with detectable rising PSA s/p RALP with PLND adenocarcinoma of the prostate, pT2cN0, Gleason 4+3 with tertiary pattern 5 and PSA of 1.76.  NARRATIVE:  The patient was brought to the [facility] [facility].  Identity was confirmed.  All relevant records and images related to the planned course of therapy were reviewed.  The patient freely provided informed written consent to proceed with treatment after reviewing the details related to the planned course of therapy. The consent form was witnessed and verified by the simulation staff.  Then, the patient was set-up in a stable reproducible supine position for radiation therapy.  A vacuum lock pillow device was custom fabricated to position his legs in a reproducible immobilized position.  Then, I performed a urethrogram under sterile conditions to identify the prostatic bed.  CT images were obtained.  Surface markings were placed.  The CT images were loaded into the planning software.  Then the prostate bed target, pelvic lymph node target and avoidance structures including the rectum, bladder, bowel and hips were contoured.  Treatment planning then occurred.  The radiation prescription was entered and confirmed.  A total of one complex treatment devices were fabricated. I have requested : Intensity Modulated Radiotherapy (IMRT) is medically necessary for this case for the following reason:  Rectal sparing.Marland Kitchen  PLAN:  The patient will receive 45 Gy in 25 fractions of 1.8 Gy, followed by a boost to the prostate bed to a total dose of 68.4 Gy with 13 additional fractions of 1.8 Gy.   ________________________________  Sheral Apley  Tammi Klippel, M.D.

## 2021-12-14 ENCOUNTER — Ambulatory Visit
Admission: RE | Admit: 2021-12-14 | Discharge: 2021-12-14 | Disposition: A | Discharge status: Home / Self Care | Payer: BC Managed Care – PPO | Source: Ambulatory Visit | Attending: Radiation Oncology | Admitting: Radiation Oncology

## 2021-12-14 ENCOUNTER — Other Ambulatory Visit: Payer: Self-pay

## 2021-12-14 DIAGNOSIS — C61 Malignant neoplasm of prostate: Secondary | ICD-10-CM | POA: Insufficient documentation

## 2021-12-14 DIAGNOSIS — R9721 Rising PSA following treatment for malignant neoplasm of prostate: Secondary | ICD-10-CM

## 2021-12-14 NOTE — Progress Notes (Signed)
RN spoke with patient's wife, they are interested in connecting with patient's that have been through surgery and have had to have additional radiation.   I provided patient's wife information about the Prostate Support Group, she was in agreement and thankful for information.

## 2021-12-16 ENCOUNTER — Ambulatory Visit: Payer: BC Managed Care – PPO | Admitting: Radiation Oncology

## 2021-12-23 DIAGNOSIS — C61 Malignant neoplasm of prostate: Secondary | ICD-10-CM | POA: Diagnosis not present

## 2021-12-23 NOTE — Progress Notes (Signed)
Pt was a follow up new on 8/4 for his rising PSA s/p RALP with PLND adenocarcinoma of the prostate, pT2cN0, Gleason 4+3 with tertiary pattern 5 and PSA of 1.71.   At that time he was agreeable to proceed with salvage radiation and underwent CT Simulation and was set up to start treatment on 8/29.     Pt recently saw Dr. Tresa Moore on 8/22 and had discussion regarding cancer control and quality of life.     Pt called today to let me know that he has decided to proceed with no treatment at this time and will continue his follow up's to monitor his PSA through [facility].     Per Dr. Zettie Pho note they will re check a PSA in 6 months (most recent 8/17 2.02) and will plan on restaging next at PSA of 10.   Dr. Tammi Klippel and RadOnc team notified.

## 2021-12-28 ENCOUNTER — Ambulatory Visit: Payer: BC Managed Care – PPO

## 2021-12-29 ENCOUNTER — Ambulatory Visit: Payer: BC Managed Care – PPO

## 2021-12-30 ENCOUNTER — Ambulatory Visit: Payer: BC Managed Care – PPO

## 2021-12-31 ENCOUNTER — Ambulatory Visit: Payer: BC Managed Care – PPO

## 2022-01-04 ENCOUNTER — Ambulatory Visit: Payer: BC Managed Care – PPO

## 2022-01-05 ENCOUNTER — Ambulatory Visit: Payer: BC Managed Care – PPO

## 2022-01-06 ENCOUNTER — Ambulatory Visit: Payer: BC Managed Care – PPO

## 2022-01-07 ENCOUNTER — Ambulatory Visit: Payer: BC Managed Care – PPO

## 2022-01-10 ENCOUNTER — Ambulatory Visit: Payer: BC Managed Care – PPO

## 2022-01-11 ENCOUNTER — Ambulatory Visit: Payer: BC Managed Care – PPO

## 2022-01-12 ENCOUNTER — Ambulatory Visit: Payer: BC Managed Care – PPO | Admitting: Family Medicine

## 2022-01-12 ENCOUNTER — Ambulatory Visit: Payer: BC Managed Care – PPO

## 2022-01-12 NOTE — Progress Notes (Signed)
[facility] [address] [place] Phone: [phone] Subjective:   IVilma Meckel, am serving as a scribe for Dr. Hulan Saas.  I'm seeing this patient by the request  of:  [name], MD  CC: Low back pain follow-up  [id number]  10/12/2021 Discussed with patient.  Patient given different activities that I think could be helpful and different ones to potentially avoid.  Discussed that we should continue to stay active but avoid anything that would make him to hold his breath or any heavy lifting.  Okay to maybe start some machine weights but 5 to 10 pounds total.  Continue with the walking on the treadmill and okay to do recumbent bike.  Total time with patient as well as reviewing patient's previous notes with primary care, laboratory work-up and surgical notes 36 minutes  Updated 01/13/2022 Melvin [name] is a 76 y.o. male coming in with complaint of back pain. Pain is same, no worse or better. Would like to talk to you about balance and exercises for those. No new issues. Patient do not feel back to the balance may be worsening somewhat.  Patient is being monitored for his prostate cancer but PSA has been stable over the last 6 months he states.  Last lab value was 2.02      Past Medical History:  Diagnosis Date   Arthritis    Cancer [facility])    Past Surgical History:  Procedure Laterality Date   INGUINAL HERNIA REPAIR Right 04/01/2021   Procedure: LAPAROSCOPIC INGUINAL HERNIA;  Surgeon: Ralene Ok, MD;  Location: [place];  Service: General;  Laterality: Right;   INGUINAL HERNIA REPAIR Left 07/09/2021   Procedure: OPEN LEFT INGUINAL HERNIA REPAIR WITH MESH;  Surgeon: Ralene Ok, MD;  Location: [place];  Service: General;  Laterality: Left;   INSERTION OF MESH Right 04/01/2021   Procedure: INSERTION OF MESH;  Surgeon: Ralene Ok, MD;  Location: [place];  Service: General;  Laterality: Right;   INSERTION OF MESH Left  07/09/2021   Procedure: INSERTION OF MESH;  Surgeon: Ralene Ok, MD;  Location: [place];  Service: General;  Laterality: Left;   LYMPHADENECTOMY Bilateral 09/12/2012   Procedure: LYMPHADENECTOMY;  Surgeon: Alexis Frock, MD;  Location: [facility];  Service: Urology;  Laterality: Bilateral;   ROBOT ASSISTED LAPAROSCOPIC RADICAL PROSTATECTOMY N/A 09/12/2012   Procedure: ROBOTIC ASSISTED LAPAROSCOPIC RADICAL PROSTATECTOMY;  Surgeon: Alexis Frock, MD;  Location: [facility];  Service: Urology;  Laterality: N/A;   TONSILLECTOMY     Social History   Socioeconomic History   Marital status: Married    Spouse name: Not on file   Number of children: Not on file   Years of education: Not on file   Highest education level: Not on file  Occupational History   Not on file  Tobacco Use   Smoking status: Former    Types: Cigarettes    Quit date: 09/12/1992    Years since quitting: 29.3   Smokeless tobacco: Never  Vaping Use   Vaping Use: Never used  Substance and Sexual Activity   Alcohol use: Not Currently   Drug use: Never   Sexual activity: Not on file  Other Topics Concern   Not on file  Social History Narrative   Not on file   Social Determinants of Health   Financial Resource Strain: Not on file  Food Insecurity: Not on file  Transportation Needs: Not on file  Physical Activity: Not on file  Stress: Not on file  Social Connections: Not on file   Allergies  Allergen Reactions   Sulfa Antibiotics Hives   No family history on file.  Current Outpatient Medications (Endocrine & Metabolic):    predniSONE (DELTASONE) 20 MG tablet, Take 2 tablets (40 mg total) by mouth daily with breakfast. (Patient not taking: Reported on 10/18/2021)  Current Outpatient Medications (Cardiovascular):    EDEX 20 MCG injection, USE ONCE A DAY AS NEEDED.   Current Outpatient Medications (Analgesics):    diclofenac (VOLTAREN) 75 MG EC tablet, Take 1 tablet (75 mg total) by mouth 2 (two) times daily.  (Patient not taking: Reported on 10/18/2021)   ibuprofen (ADVIL) 200 MG tablet, Take 200 mg by mouth every 8 (eight) hours as needed (back pain.).   oxyCODONE-acetaminophen (PERCOCET) 5-325 MG tablet, Take 1 tablet by mouth every 4 (four) hours as needed for severe pain. (Patient not taking: Reported on 10/18/2021)   traMADol (ULTRAM) 50 MG tablet, Take 1 tablet (50 mg total) by mouth every 6 (six) hours as needed. (Patient not taking: Reported on 10/18/2021)   Current Outpatient Medications (Other):    b complex vitamins capsule, Take 1 capsule by mouth 4 (four) times a week.   chlorhexidine (PERIDEX) 0.12 % solution, SMARTSIG:By Mouth   cimetidine (TAGAMET) 200 MG tablet, Take 200 mg by mouth 2 (two) times daily.   dorzolamidel-timolol (COSOPT PF) 22.3-6.8 MG/ML SOLN ophthalmic solution, Place 1 drop into both eyes 2 (two) times daily.   fluocinonide cream (LIDEX) 0.05 %, APPLY TO THE AFFECTED AREA ONCE A DAY.   ketoconazole (NIZORAL) 2 % cream, SMARTSIG:1 Topical Daily   linaclotide (LINZESS) 290 MCG CAPS capsule, Take 290 mcg by mouth daily as needed (constipation).   Multiple Vitamin (MULTIVITAMIN WITH MINERALS) TABS tablet, Take 1 tablet by mouth daily with lunch.   tacrolimus (PROTOPIC) 0.1 % ointment, Apply 1 Application topically 2 (two) times daily as needed.   Tafluprost, PF, (ZIOPTAN) 0.0015 % SOLN, Place 1 drop into both eyes at bedtime.    Review of Systems:  No headache, visual changes, nausea, vomiting, diarrhea, constipation, dizziness, abdominal pain, skin rash, fevers, chills, night sweats, weight loss, swollen lymph nodes,, joint swelling, chest pain, shortness of breath, mood changes. POSITIVE muscle aches, body aches, difficulty with balance  Objective  Blood pressure 132/78, pulse 76, height '6\' 1"'$  (1.854 m), weight 171 lb (77.6 kg), SpO2 97 %.   General: No apparent distress alert and oriented x3 mood and affect normal, dressed appropriately.  HEENT: Pupils equal,  extraocular movements intact  Respiratory: Patient's speak in full sentences and does not appear short of breath  Cardiovascular: No lower extremity edema, non tender, no erythema  Patient does have significant scoliosis noted.  Tender to palpation somewhat.  Patient is sitting relatively comfortably in his chair with his feet crossed.  Severe overpronation of the hindfoot bilaterally.    Impression and Recommendations:    The above documentation has been reviewed and is accurate and complete Lyndal Pulley, DO

## 2022-01-13 ENCOUNTER — Ambulatory Visit (INDEPENDENT_AMBULATORY_CARE_PROVIDER_SITE_OTHER): Payer: BC Managed Care – PPO | Admitting: Family Medicine

## 2022-01-13 ENCOUNTER — Ambulatory Visit: Payer: BC Managed Care – PPO

## 2022-01-13 VITALS — BP 132/78 | HR 76 | Ht 73.0 in | Wt 171.0 lb

## 2022-01-13 DIAGNOSIS — R2689 Other abnormalities of gait and mobility: Secondary | ICD-10-CM

## 2022-01-13 DIAGNOSIS — R279 Unspecified lack of coordination: Secondary | ICD-10-CM | POA: Diagnosis not present

## 2022-01-13 DIAGNOSIS — M48061 Spinal stenosis, lumbar region without neurogenic claudication: Secondary | ICD-10-CM | POA: Diagnosis not present

## 2022-01-13 NOTE — Patient Instructions (Addendum)
Overall not doing bad PT referral New shoes Work on standing on 1 foot

## 2022-01-13 NOTE — Assessment & Plan Note (Addendum)
Significant degenerative spinal stenosis noted again.  Significant scoliosis noted.  Patient is having worsening balance and coordination that is likely secondary to this and the neurologic complications.  Patient wants to continue to try a note to continue conservative therapy.  Willing to try aquatic therapy as well as formal physical therapy.  Discussed with patient about seeing PMNR again as well.  He can forward note if he decides to do this.  Discussed proper shoe choices to help with alignment.  Follow-up again in 2 to 3 months.  Total time reviewing patient's chart and discussing with patient 36 minutes

## 2022-01-14 ENCOUNTER — Ambulatory Visit: Payer: BC Managed Care – PPO

## 2022-01-17 ENCOUNTER — Ambulatory Visit: Payer: BC Managed Care – PPO

## 2022-01-18 ENCOUNTER — Ambulatory Visit: Payer: BC Managed Care – PPO

## 2022-01-19 ENCOUNTER — Ambulatory Visit: Payer: BC Managed Care – PPO

## 2022-01-20 ENCOUNTER — Ambulatory Visit: Payer: BC Managed Care – PPO

## 2022-01-21 ENCOUNTER — Ambulatory Visit: Payer: BC Managed Care – PPO

## 2022-01-24 ENCOUNTER — Ambulatory Visit: Payer: BC Managed Care – PPO

## 2022-01-25 ENCOUNTER — Ambulatory Visit: Payer: BC Managed Care – PPO

## 2022-01-26 ENCOUNTER — Ambulatory Visit: Payer: BC Managed Care – PPO

## 2022-01-27 ENCOUNTER — Ambulatory Visit: Payer: BC Managed Care – PPO

## 2022-01-28 ENCOUNTER — Ambulatory Visit: Payer: BC Managed Care – PPO

## 2022-01-31 ENCOUNTER — Ambulatory Visit: Payer: BC Managed Care – PPO

## 2022-02-01 ENCOUNTER — Ambulatory Visit: Payer: BC Managed Care – PPO

## 2022-02-02 ENCOUNTER — Ambulatory Visit: Payer: BC Managed Care – PPO

## 2022-02-03 ENCOUNTER — Ambulatory Visit: Payer: BC Managed Care – PPO

## 2022-02-04 ENCOUNTER — Ambulatory Visit: Payer: BC Managed Care – PPO

## 2022-02-07 ENCOUNTER — Ambulatory Visit: Payer: BC Managed Care – PPO

## 2022-02-08 ENCOUNTER — Ambulatory Visit: Payer: BC Managed Care – PPO

## 2022-02-08 ENCOUNTER — Ambulatory Visit (HOSPITAL_BASED_OUTPATIENT_CLINIC_OR_DEPARTMENT_OTHER): Payer: BC Managed Care – PPO | Admitting: Physical Therapy

## 2022-02-09 ENCOUNTER — Ambulatory Visit: Payer: BC Managed Care – PPO

## 2022-02-10 ENCOUNTER — Ambulatory Visit: Payer: BC Managed Care – PPO

## 2022-02-11 ENCOUNTER — Ambulatory Visit: Payer: BC Managed Care – PPO

## 2022-02-14 ENCOUNTER — Encounter: Payer: Self-pay | Admitting: Internal Medicine

## 2022-02-14 ENCOUNTER — Ambulatory Visit: Payer: BC Managed Care – PPO

## 2022-02-14 NOTE — Patient Instructions (Addendum)
Flu immunization administered today.     Your ears were cleaned out today.   Blood work was ordered.   The lab in on the first floor.    Medications changes include :   none     Return in about 1 year (around 02/16/2023) for Physical Exam.   Health Maintenance, Male Adopting a healthy lifestyle and getting preventive care are important in promoting health and wellness. Ask your health care provider about: The right schedule for you to have regular tests and exams. Things you can do on your own to prevent diseases and keep yourself healthy. What should I know about diet, weight, and exercise? Eat a healthy diet  Eat a diet that includes plenty of vegetables, fruits, low-fat dairy products, and lean protein. Do not eat a lot of foods that are high in solid fats, added sugars, or sodium. Maintain a healthy weight Body mass index (BMI) is a measurement that can be used to identify possible weight problems. It estimates body fat based on height and weight. Your health care provider can help determine your BMI and help you achieve or maintain a healthy weight. Get regular exercise Get regular exercise. This is one of the most important things you can do for your health. Most adults should: Exercise for at least 150 minutes each week. The exercise should increase your heart rate and make you sweat (moderate-intensity exercise). Do strengthening exercises at least twice a week. This is in addition to the moderate-intensity exercise. Spend less time sitting. Even light physical activity can be beneficial. Watch cholesterol and blood lipids Have your blood tested for lipids and cholesterol at 77 years of age, then have this test every 5 years. You may need to have your cholesterol levels checked more often if: Your lipid or cholesterol levels are high. You are older than 77 years of age. You are at high risk for heart disease. What should I know about cancer screening? Many types  of cancers can be detected early and may often be prevented. Depending on your health history and family history, you may need to have cancer screening at various ages. This may include screening for: Colorectal cancer. Prostate cancer. Skin cancer. Lung cancer. What should I know about heart disease, diabetes, and high blood pressure? Blood pressure and heart disease High blood pressure causes heart disease and increases the risk of stroke. This is more likely to develop in people who have high blood pressure readings or are overweight. Talk with your health care provider about your target blood pressure readings. Have your blood pressure checked: Every 3-5 years if you are 29-50 years of age. Every year if you are 32 years old or older. If you are between the ages of 31 and 55 and are a current or former smoker, ask your health care provider if you should have a one-time screening for abdominal aortic aneurysm (AAA). Diabetes Have regular diabetes screenings. This checks your fasting blood sugar level. Have the screening done: Once every three years after age 71 if you are at a normal weight and have a low risk for diabetes. More often and at a younger age if you are overweight or have a high risk for diabetes. What should I know about preventing infection? Hepatitis B If you have a higher risk for hepatitis B, you should be screened for this virus. Talk with your health care provider to find out if you are at risk for hepatitis B infection. Hepatitis C Blood testing  is recommended for: Everyone born from [birth year] through 1966. Anyone with known risk factors for hepatitis C. Sexually transmitted infections (STIs) You should be screened each year for STIs, including gonorrhea and chlamydia, if: You are sexually active and are younger than 77 years of age. You are older than 77 years of age and your health care provider tells you that you are at risk for this type of infection. Your sexual  activity has changed since you were last screened, and you are at increased risk for chlamydia or gonorrhea. Ask your health care provider if you are at risk. Ask your health care provider about whether you are at high risk for HIV. Your health care provider may recommend a prescription medicine to help prevent HIV infection. If you choose to take medicine to prevent HIV, you should first get tested for HIV. You should then be tested every 3 months for as long as you are taking the medicine. Follow these instructions at home: Alcohol use Do not drink alcohol if your health care provider tells you not to drink. If you drink alcohol: Limit how much you have to 0-2 drinks a day. Know how much alcohol is in your drink. In the [place] one drink equals one 12 oz bottle of beer (355 mL), one 5 oz glass of wine (148 mL), or one 1 oz glass of hard liquor (44 mL). Lifestyle Do not use any products that contain nicotine or tobacco. These products include cigarettes, chewing tobacco, and vaping devices, such as e-cigarettes. If you need help quitting, ask your health care provider. Do not use street drugs. Do not share needles. Ask your health care provider for help if you need support or information about quitting drugs. General instructions Schedule regular health, dental, and eye exams. Stay current with your vaccines. Tell your health care provider if: You often feel depressed. You have ever been abused or do not feel safe at home. Summary Adopting a healthy lifestyle and getting preventive care are important in promoting health and wellness. Follow your health care provider's instructions about healthy diet, exercising, and getting tested or screened for diseases. Follow your health care provider's instructions on monitoring your cholesterol and blood pressure. This information is not intended to replace advice given to you by your health care provider. Make sure you discuss any questions you have  with your health care provider. Document Revised: 09/07/2020 Document Reviewed: 09/07/2020 Elsevier Patient Education  [place].

## 2022-02-14 NOTE — Progress Notes (Unsigned)
Subjective:    Patient ID: Melvin [name], male    DOB: August 03, 1945, 76 y.o.   MRN: [id number]     HPI Clide is here for a physical exam.   Right ear feels plugged.  No pain.    Psa rising slowly - 2.02 in august. PET scan neg. following with urology.   Medications and allergies reviewed with patient and updated if appropriate.  Current Outpatient Medications on File Prior to Visit  Medication Sig Dispense Refill   b complex vitamins capsule Take 1 capsule by mouth 4 (four) times a week.     chlorhexidine (PERIDEX) 0.12 % solution SMARTSIG:By Mouth     cimetidine (TAGAMET) 200 MG tablet Take 200 mg by mouth 2 (two) times daily.     dorzolamidel-timolol (COSOPT PF) 22.3-6.8 MG/ML SOLN ophthalmic solution Place 1 drop into both eyes 2 (two) times daily.     EDEX 20 MCG injection USE ONCE A DAY AS NEEDED.     ibuprofen (ADVIL) 200 MG tablet Take 200 mg by mouth every 8 (eight) hours as needed (back pain.).     ketoconazole (NIZORAL) 2 % cream SMARTSIG:1 Topical Daily     Multiple Vitamin (MULTIVITAMIN WITH MINERALS) TABS tablet Take 1 tablet by mouth daily with lunch.     tacrolimus (PROTOPIC) 0.1 % ointment Apply 1 Application topically 2 (two) times daily as needed.     Tafluprost, PF, (ZIOPTAN) 0.0015 % SOLN Place 1 drop into both eyes at bedtime.     ofloxacin (OCUFLOX) 0.3 % ophthalmic solution Place 1 drop into the left eye 4 (four) times daily Please begin medication after scheduled procedure.     prednisoLONE acetate (PRED FORTE) 1 % ophthalmic suspension Place 1 drop into the left eye 4 (four) times daily Please begin medication after scheduled procedure.     No current facility-administered medications on file prior to visit.    Review of Systems  Constitutional:  Negative for chills and fever.  Eyes:  Negative for visual disturbance.  Respiratory:  Negative for cough, shortness of breath and wheezing.   Cardiovascular:  Negative for chest pain, palpitations and leg  swelling.  Gastrointestinal:  Positive for constipation (controlled). Negative for abdominal pain, blood in stool, diarrhea and nausea.       Gerd controlled  Genitourinary:  Negative for dysuria and hematuria.  Musculoskeletal:  Positive for back pain. Negative for arthralgias.  Skin:  Negative for rash.  Neurological:  Positive for tremors (occ left hand). Negative for light-headedness and headaches.  Psychiatric/Behavioral:  Negative for dysphoric mood. The patient is not nervous/anxious.        Objective:   Vitals:   02/15/22 1335  BP: 134/80  Pulse: 72  Temp: 98.6 F (37 C)  SpO2: 98%   Filed Weights   02/15/22 1335  Weight: 168 lb 9.6 oz (76.5 kg)   Body mass index is 22.24 kg/m.  BP Readings from Last 3 Encounters:  02/15/22 134/80  01/13/22 132/78  12/03/21 139/75    Wt Readings from Last 3 Encounters:  02/15/22 168 lb 9.6 oz (76.5 kg)  01/13/22 171 lb (77.6 kg)  12/03/21 168 lb 4 oz (76.3 kg)      Physical Exam Constitutional: He appears well-developed and well-nourished. No distress.  HENT:  Head: Normocephalic and atraumatic.  Right Ear: External ear normal.  Left Ear: External ear normal.  Mouth/Throat: Oropharynx is clear and moist.  Normal ear canals and TM b/l  Eyes: Conjunctivae and EOM are  normal.  Neck: Neck supple. No tracheal deviation present. No thyromegaly present.  No carotid bruit  Cardiovascular: Normal rate, regular rhythm, normal heart sounds and intact distal pulses.   No murmur heard. Pulmonary/Chest: Effort normal and breath sounds normal. No respiratory distress. He has no wheezes. He has no rales.  Abdominal: Soft. He exhibits no distension. There is no tenderness.  Genitourinary: deferred  Musculoskeletal: He exhibits no edema.  Lymphadenopathy:   He has no cervical adenopathy.  Skin: Skin is warm and dry. He is not diaphoretic.  Psychiatric: He has a normal mood and affect. His behavior is normal.         Assessment  & Plan:   Physical exam: Screening blood work  ordered Exercise   not regular-need to start physical therapy soon Weight normal Substance abuse   none   Reviewed recommended immunizations.   Health Maintenance  Topic Date Due   Hepatitis C Screening  Never done   INFLUENZA VACCINE  11/30/2021   COVID-19 Vaccine (3 - Pfizer risk series) 03/03/2022 (Originally 07/11/2019)   Zoster Vaccines- Shingrix (1 of 2) 05/18/2022 (Originally 12/14/1964)   TETANUS/TDAP  01/06/2027   Pneumonia Vaccine 78+ Years old  Completed   HPV VACCINES  Aged Out     See Problem List for Assessment and Plan of chronic medical problems.

## 2022-02-15 ENCOUNTER — Ambulatory Visit (INDEPENDENT_AMBULATORY_CARE_PROVIDER_SITE_OTHER): Payer: BC Managed Care – PPO | Admitting: Internal Medicine

## 2022-02-15 ENCOUNTER — Ambulatory Visit: Payer: BC Managed Care – PPO

## 2022-02-15 VITALS — BP 134/80 | HR 72 | Temp 98.6°F | Ht 73.0 in | Wt 168.6 lb

## 2022-02-15 DIAGNOSIS — Z23 Encounter for immunization: Secondary | ICD-10-CM

## 2022-02-15 DIAGNOSIS — L309 Dermatitis, unspecified: Secondary | ICD-10-CM | POA: Diagnosis not present

## 2022-02-15 DIAGNOSIS — Z1159 Encounter for screening for other viral diseases: Secondary | ICD-10-CM

## 2022-02-15 DIAGNOSIS — Z13220 Encounter for screening for lipoid disorders: Secondary | ICD-10-CM

## 2022-02-15 DIAGNOSIS — Z0000 Encounter for general adult medical examination without abnormal findings: Secondary | ICD-10-CM

## 2022-02-15 DIAGNOSIS — H6123 Impacted cerumen, bilateral: Secondary | ICD-10-CM | POA: Diagnosis not present

## 2022-02-15 DIAGNOSIS — R739 Hyperglycemia, unspecified: Secondary | ICD-10-CM | POA: Diagnosis not present

## 2022-02-15 DIAGNOSIS — R7303 Prediabetes: Secondary | ICD-10-CM | POA: Insufficient documentation

## 2022-02-15 DIAGNOSIS — K219 Gastro-esophageal reflux disease without esophagitis: Secondary | ICD-10-CM

## 2022-02-15 LAB — COMPREHENSIVE METABOLIC PANEL
ALT: 24 U/L (ref 0–53)
AST: 19 U/L (ref 0–37)
Albumin: 4.3 g/dL (ref 3.5–5.2)
Alkaline Phosphatase: 71 U/L (ref 39–117)
BUN: 19 mg/dL (ref 6–23)
CO2: 27 mEq/L (ref 19–32)
Calcium: 9.2 mg/dL (ref 8.4–10.5)
Chloride: 103 mEq/L (ref 96–112)
Creatinine, Ser: 0.97 mg/dL (ref 0.40–1.50)
GFR: 75.48 mL/min (ref >60.00)
Glucose, Bld: 89 mg/dL (ref 70–99)
Potassium: 4.2 mEq/L (ref 3.5–5.1)
Sodium: 138 mEq/L (ref 135–145)
Total Bilirubin: 0.8 mg/dL (ref 0.2–1.2)
Total Protein: 6.6 g/dL (ref 6.0–8.3)

## 2022-02-15 LAB — CBC WITH DIFFERENTIAL/PLATELET
Basophils Absolute: 0.1 10*3/uL (ref 0.0–0.1)
Basophils Relative: 0.8 % (ref 0.0–3.0)
Eosinophils Absolute: 0.6 10*3/uL (ref 0.0–0.7)
Eosinophils Relative: 9.7 % — ABNORMAL HIGH (ref 0.0–5.0)
HCT: 40.3 % (ref 39.0–52.0)
Hemoglobin: 13.7 g/dL (ref 13.0–17.0)
Lymphocytes Relative: 29.4 % (ref 12.0–46.0)
Lymphs Abs: 2 10*3/uL (ref 0.7–4.0)
MCHC: 34 g/dL (ref 30.0–36.0)
MCV: 97.2 fl (ref 78.0–100.0)
Monocytes Absolute: 0.6 10*3/uL (ref 0.1–1.0)
Monocytes Relative: 8.5 % (ref 3.0–12.0)
Neutro Abs: 3.4 10*3/uL (ref 1.4–7.7)
Neutrophils Relative %: 51.6 % (ref 43.0–77.0)
Platelets: 223 10*3/uL (ref 150.0–400.0)
RBC: 4.15 Mil/uL — ABNORMAL LOW (ref 4.22–5.81)
RDW: 13.2 % (ref 11.5–15.5)
WBC: 6.7 10*3/uL (ref 4.0–10.5)

## 2022-02-15 LAB — HEMOGLOBIN A1C: Hgb A1c MFr Bld: 5.6 % (ref 4.6–6.5)

## 2022-02-15 LAB — LIPID PANEL
Cholesterol: 163 mg/dL (ref 0–200)
HDL: 67.9 mg/dL (ref >39.00)
LDL Cholesterol: 85 mg/dL (ref 0–99)
NonHDL: 95.33
Total CHOL/HDL Ratio: 2
Triglycerides: 52 mg/dL (ref 0.0–149.0)
VLDL: 10.4 mg/dL (ref 0.0–40.0)

## 2022-02-15 LAB — TSH: TSH: 1.51 u[IU]/mL (ref 0.35–5.50)

## 2022-02-15 MED ORDER — LINACLOTIDE 290 MCG PO CAPS: 290.0000 ug | ORAL_CAPSULE | Freq: Every day | ORAL | 4 refills | Status: DC | PRN

## 2022-02-15 MED ORDER — FLUOCINONIDE 0.05 % EX CREA: TOPICAL_CREAM | CUTANEOUS | 2 refills | Status: DC

## 2022-02-15 NOTE — Progress Notes (Unsigned)
PRE-PROCEDURE EXAM: Left TM cannot be visualized due to total occlusion/impaction of the ear canal.  PROCEDURE INDICATION: remove wax to visualize ear drum & relieve discomfort  CONSENT:  Verbal     PROCEDURE NOTE:     bilateral EARS:  I used warm water irrigation under direct visualization with the otoscope to free the wax bolus from the ear canal.    POST- PROCEDURE EXAM: TMs successfully visualized and found to have no erythema     The patient tolerated the procedure well.

## 2022-02-15 NOTE — Assessment & Plan Note (Signed)
Acute Experiencing b/l hearing loss and ears feels clogged B/l impaction CMA successfully lavaged ears and symptoms improved

## 2022-02-15 NOTE — Assessment & Plan Note (Signed)
Chronic Continue flucinonide cream prn

## 2022-02-15 NOTE — Assessment & Plan Note (Signed)
Chronic Check a1c Low sugar / carb diet Stressed regular exercise  

## 2022-02-15 NOTE — Assessment & Plan Note (Signed)
Chronic GERD controlled Continue tagamet

## 2022-02-16 ENCOUNTER — Ambulatory Visit: Payer: BC Managed Care – PPO

## 2022-02-16 DIAGNOSIS — Z23 Encounter for immunization: Secondary | ICD-10-CM

## 2022-02-16 LAB — HEPATITIS C ANTIBODY: Hepatitis C Ab: NONREACTIVE

## 2022-02-17 ENCOUNTER — Ambulatory Visit: Payer: BC Managed Care – PPO

## 2022-02-17 ENCOUNTER — Ambulatory Visit (HOSPITAL_BASED_OUTPATIENT_CLINIC_OR_DEPARTMENT_OTHER): Payer: BC Managed Care – PPO | Attending: Family Medicine | Admitting: Physical Therapy

## 2022-02-17 ENCOUNTER — Encounter (HOSPITAL_BASED_OUTPATIENT_CLINIC_OR_DEPARTMENT_OTHER): Payer: Self-pay | Admitting: Physical Therapy

## 2022-02-17 DIAGNOSIS — R2689 Other abnormalities of gait and mobility: Secondary | ICD-10-CM | POA: Diagnosis not present

## 2022-02-17 DIAGNOSIS — M48061 Spinal stenosis, lumbar region without neurogenic claudication: Secondary | ICD-10-CM | POA: Insufficient documentation

## 2022-02-17 DIAGNOSIS — R262 Difficulty in walking, not elsewhere classified: Secondary | ICD-10-CM | POA: Diagnosis not present

## 2022-02-17 DIAGNOSIS — M5136 Other intervertebral disc degeneration, lumbar region: Secondary | ICD-10-CM | POA: Insufficient documentation

## 2022-02-17 DIAGNOSIS — M419 Scoliosis, unspecified: Secondary | ICD-10-CM | POA: Insufficient documentation

## 2022-02-17 DIAGNOSIS — R293 Abnormal posture: Secondary | ICD-10-CM | POA: Insufficient documentation

## 2022-02-17 DIAGNOSIS — R279 Unspecified lack of coordination: Secondary | ICD-10-CM | POA: Insufficient documentation

## 2022-02-17 DIAGNOSIS — R269 Unspecified abnormalities of gait and mobility: Secondary | ICD-10-CM

## 2022-02-17 DIAGNOSIS — M549 Dorsalgia, unspecified: Secondary | ICD-10-CM | POA: Diagnosis not present

## 2022-02-17 NOTE — Therapy (Addendum)
OUTPATIENT PHYSICAL THERAPY LOWER EXTREMITY EVALUATION  PHYSICAL THERAPY DISCHARGE SUMMARY  Visits from Start of Care: 1  Current functional level related to goals / functional outcomes: unk   Remaining deficits: all   Education / Equipment: Discussed eval findings, rehab rationale, aquatic program progression/POC and pools in area. Patient is in agreement     Patient agrees to discharge. Patient goals were not met. Patient is being discharged due to not returning since the last visit.  Patient Name: Melvin [name] MRN: [id number] DOB:August 03, 1945, 76 y.o., male Today's Date: 02/17/2022   PT End of Session - 02/17/22 1753     Visit Number 1    Number of Visits 20    Date for PT Re-Evaluation 05/12/22    Authorization Type [employer]    Progress Note Due on Visit 10    PT Start Time 1205    PT Stop Time 1245    PT Time Calculation (min) 40 min    Activity Tolerance Patient tolerated treatment well;Other (comment)   had flu shot 2 days ago and not feeling well   Behavior During Therapy WFL for tasks assessed/performed             Past Medical History:  Diagnosis Date   Arthritis    Cancer [facility])    Past Surgical History:  Procedure Laterality Date   INGUINAL HERNIA REPAIR Right 04/01/2021   Procedure: LAPAROSCOPIC INGUINAL HERNIA;  Surgeon: Axel Filler, MD;  Location: [facility] OR;  Service: General;  Laterality: Right;   INGUINAL HERNIA REPAIR Left 07/09/2021   Procedure: OPEN LEFT INGUINAL HERNIA REPAIR WITH MESH;  Surgeon: Axel Filler, MD;  Location: [facility] OR;  Service: General;  Laterality: Left;   INSERTION OF MESH Right 04/01/2021   Procedure: INSERTION OF MESH;  Surgeon: Axel Filler, MD;  Location: [facility] OR;  Service: General;  Laterality: Right;   INSERTION OF MESH Left 07/09/2021   Procedure: INSERTION OF MESH;  Surgeon: Axel Filler, MD;  Location: [facility] OR;  Service: General;  Laterality: Left;   LYMPHADENECTOMY Bilateral 09/12/2012   Procedure:  LYMPHADENECTOMY;  Surgeon: Sebastian Ache, MD;  Location: [facility];  Service: Urology;  Laterality: Bilateral;   ROBOT ASSISTED LAPAROSCOPIC RADICAL PROSTATECTOMY N/A 09/12/2012   Procedure: ROBOTIC ASSISTED LAPAROSCOPIC RADICAL PROSTATECTOMY;  Surgeon: Sebastian Ache, MD;  Location: [facility];  Service: Urology;  Laterality: N/A;   TONSILLECTOMY     Patient Active Problem List   Diagnosis Date Noted   Hyperglycemia 02/15/2022   Dermatitis, unspecified 02/15/2022   Bilateral hearing loss due to cerumen impaction 02/15/2022   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) 10/18/2021   History of prostate cancer 03/22/2021   Glaucoma - [facility] 03/22/2021   Constipation 03/22/2021   H/O adenomatous polyp of colon 03/22/2021   GERD (gastroesophageal reflux disease) 03/22/2021   History of SCC (squamous cell carcinoma) of skin 03/22/2021   Allergic rhinitis 03/21/2021   Degenerative scoliosis 11/23/2020   Inguinal hernia 09/23/2020   Degenerative lumbar spinal stenosis 08/03/2019   Ankle arthritis 05/31/2018   Low back pain 10/30/2017   Posterior tibial tendon dysfunction (PTTD) of right lower extremity 02/24/2017    PCP: Pincus Sanes, MD  REFERRING PROVIDER: Judi Saa, DO  REFERRING DIAG:  R26.89 (ICD-10-CM) - Balance problem  R27.9 (ICD-10-CM) - Coordination problem    THERAPY DIAG:  Balance disorder  Abnormality of gait and mobility  Abnormal posture  Rationale for Evaluation and Treatment Rehabilitation  ONSET DATE: >5 yrs  SUBJECTIVE:   SUBJECTIVE STATEMENT: I had the flu shot and have not been feeling well the last 2 days. (Pt used wc to and from pool today) I usually walk without any problem and without a cane.  Missed the step walking down of the deck yesterday to walk the dog and scraped my leg.  My back has gotten progressively worse over the last few years and I have lost a lot of weight.  Pain only when I stand  PERTINENT  HISTORY: Lumbar stenosis; inguinal herniation occurring  (with repair) at the time of last land based physical therapy 2022.  PAIN:  Are you having pain? Yes: NPRS scale: current 0/10; worst 12/09/08 Pain location: Left LB Pain description: ache Aggravating factors: standing and walking Relieving factors: sitting, lying down  PRECAUTIONS: Back and Fall  WEIGHT BEARING RESTRICTIONS No  FALLS:  Has patient fallen in last 6 months? Yes. Number of falls 1  LIVING ENVIRONMENT: Lives with: lives with their family Lives in: House/apartment Stairs: Yes: External: 4 steps; can reach both Has following equipment at home: Single point cane  OCCUPATION: college professor  teaches on line  PLOF: indep  PATIENT GOALS improve balance, improve strength, improve stair climbing, standing   OBJECTIVE:   DIAGNOSTIC FINDINGS: MRI lumbar spine 04/14/21: Advanced multilevel degenerative changes of the lumbar spine as described above, worst at L4-L5. Unchanged levoscoliosis. Mild retrolisthesis at L1-L2,(progressed) L2-L3, and L3-L4.  PATIENT SURVEYS:  FOTO 42 with goal of 50  COGNITION:  Overall cognitive status: Within functional limits for tasks assessed     SENSATION: WFL   POSTURE:  levoscoliosis   PALPATION: No TTP    UPPER EXTREMITY ROM:   WFL but with pain at end range  LOWER EXTREMITY ROM:  Active ROM Right eval Left eval  Hip flexion 100 100  Hip extension    Hip abduction    Hip adduction    Hip internal rotation    Hip external rotation    Knee flexion wfl wfl  Knee extension    Ankle dorsiflexion    Ankle plantarflexion    Ankle inversion    Ankle eversion     (Blank rows = not tested)  LOWER EXTREMITY MMT:  MMT Right eval Left eval  Hip flexion 3- P! 3- P!  Hip extension    Hip abduction 4 4  Hip adduction 4 4  Hip internal rotation    Hip external rotation    Knee flexion 4 4  Knee extension 4 4  Ankle dorsiflexion    Ankle plantarflexion     Ankle inversion    Ankle eversion     (Blank rows = not tested)    FUNCTIONAL TESTS:  30 seconds chair stand test/5x STS test: unable to tolerate Timed up and go (TUG): unable to tolerate Standing balance: x20s Berg: unable to tolerate  GAIT: Distance walked: 20 ft Assistive device utilized:  none needed CGA for safety Level of assistance: CGA Comments: shortened step length, dragging feet, decreased cadence, unsteady. Posture: forward flexion with levoscoliotic curve    TODAY'S TREATMENT: Eval Objective testing as able   PATIENT EDUCATION:  Education details: Discussed eval findings, rehab rationale and POC and patient is in agreement Propeties of water and benefit of aquatic therapy. Person educated: Patient and Spouse Education method: Explanation Education comprehension: verbalized understanding   HOME EXERCISE PROGRAM: TBA  ASSESSMENT:  CLINICAL IMPRESSION: Patient is a 76 y.o. m who was seen today for physical therapy evaluation and treatment  for balance and coordination dysfunction. Pt with Significant degenerative spinal stenosis and significant scoliosis (levoscoliosis) .  Patient is having worsening balance and coordination that is likely secondary to this and the neurologic complications.  He presents with wife in wc today with complaints of feeling weak and sick from flu shot taken 2 days ago.  He and wife report he is usually an indep amb without AD and will be able to use stairs to enter pool.Marland Kitchen He is somewhat apprehensive and will need physical assistance in pool by therapist. With testing pt has decreased strength in hips and core. Today his balance is poor and he is unable to tolerate other functional testing.  He is a good candidate for aquatic therapy and will benefit from the properties of water to strength and improve balance and coordination in a decreased fall risk setting.   OBJECTIVE IMPAIRMENTS Abnormal gait, decreased activity tolerance,  decreased balance, decreased coordination, decreased endurance, decreased mobility, difficulty walking, decreased ROM, decreased strength, decreased safety awareness, impaired flexibility, impaired vision/preception, postural dysfunction, and pain.   ACTIVITY LIMITATIONS carrying, lifting, bending, standing, squatting, sleeping, stairs, transfers, and reach over head  PARTICIPATION LIMITATIONS: meal prep, cleaning, laundry, driving, shopping, community activity, and occupation  PERSONAL FACTORS Age, Fitness, Past/current experiences, Time since onset of injury/illness/exacerbation, and 1-2 comorbidities: spinal stenosis, scoliosis  are also affecting patient's functional outcome.   REHAB POTENTIAL: Fair    CLINICAL DECISION MAKING: Unstable/unpredictable  EVALUATION COMPLEXITY: Moderate   GOALS: Goals reviewed with patient? Yes  SHORT TERM GOALS: Target date: 03/18/22  Pt will tolerate initial aquatic therapy sessions (2-3) consistently without fatigue or increased pain Baseline:NA Goal status: INITIAL  2.  Pt to tolerate objective testing with goals being set Baseline: unable to tolerate Goal status: INITIAL  3.  Pt will report being able to climb stairs off back deck to walk leashed dog without fall and improved confidence. Baseline: fell last attempt Goal status: INITIAL   LONG TERM GOALS: Target date: 05/12/2022   Pt to reach Foto goal of 50% Baseline: 42% Goal status: INITIAL  2.  Pt will increase LE hip strength by >or=1 full grade to improve functional mobility balance Baseline: 3- Goal status: INITIAL  3.  Pt to improve on Berg balance scale to at least 40/56 to demonstrate decreased fall risk Baseline: To be tested Goal status: INITIAL  4.  Pt will improve on TUG test to <or= 15s to demonstrate improved balance and strength Baseline: TBA Goal status: INITIAL  5.  Pt to improve on 5x STS test to demonstrate improved strength for improved function and decreased  fall risk Baseline: to be assessed Goal status: INITIAL  6.  Pt will report decreased "worst" pain by 50% Baseline: 8/10 Goal status: INITIAL   PLAN: PT FREQUENCY: 1-2x/week  PT DURATION: other: 12 week. . consideration taken with entering holiday season and pt requiring 1-2 week delay beginning services due to recent fall with abrasions on LLE   PLANNED INTERVENTIONS: Therapeutic exercises, Therapeutic activity, Neuromuscular re-education, Balance training, Gait training, Patient/Family education, Self Care, Joint mobilization, Joint manipulation, Stair training, DME instructions, Aquatic Therapy, Dry Needling, Cryotherapy, Moist heat, Taping, Ultrasound, Manual therapy, and Re-evaluation  PLAN FOR NEXT SESSION: intro to setting; work on Scientific laboratory technician; balance retraining   Geni Bers, PT MPT 02/17/2022, 6:13 PM   Addend Corrie Dandy Tomma Lightning) Shakai Dolley MPT 10/05/22 136p

## 2022-02-18 ENCOUNTER — Ambulatory Visit: Payer: BC Managed Care – PPO

## 2022-02-22 ENCOUNTER — Telehealth: Payer: Self-pay | Admitting: Internal Medicine

## 2022-02-22 NOTE — Telephone Encounter (Signed)
Patient got the flu shot last Tuesday - since then he has been pale, lethargic, running a slight fever and feeling shaky.  Could this be coming from the shot and what should patient do about this.

## 2022-02-22 NOTE — Telephone Encounter (Signed)
Spoke with patient today.  He will keep eye out on his symptoms and follow up if not better.  Patient states fever has gone and he is starting to feel better.

## 2022-02-23 ENCOUNTER — Ambulatory Visit (INDEPENDENT_AMBULATORY_CARE_PROVIDER_SITE_OTHER): Payer: BC Managed Care – PPO | Admitting: Family Medicine

## 2022-02-23 ENCOUNTER — Ambulatory Visit: Payer: BC Managed Care – PPO

## 2022-02-23 ENCOUNTER — Encounter: Payer: Self-pay | Admitting: Family Medicine

## 2022-02-23 ENCOUNTER — Ambulatory Visit: Payer: Self-pay

## 2022-02-23 ENCOUNTER — Ambulatory Visit (INDEPENDENT_AMBULATORY_CARE_PROVIDER_SITE_OTHER): Payer: BC Managed Care – PPO

## 2022-02-23 VITALS — BP 138/84 | HR 76 | Ht 73.0 in | Wt 171.0 lb

## 2022-02-23 DIAGNOSIS — S91332A Puncture wound without foreign body, left foot, initial encounter: Secondary | ICD-10-CM | POA: Diagnosis not present

## 2022-02-23 DIAGNOSIS — M79672 Pain in left foot: Secondary | ICD-10-CM

## 2022-02-23 DIAGNOSIS — W450XXA Nail entering through skin, initial encounter: Secondary | ICD-10-CM

## 2022-02-23 DIAGNOSIS — Z23 Encounter for immunization: Secondary | ICD-10-CM

## 2022-02-23 DIAGNOSIS — L03119 Cellulitis of unspecified part of limb: Secondary | ICD-10-CM | POA: Diagnosis present

## 2022-02-23 DIAGNOSIS — L0390 Cellulitis, unspecified: Secondary | ICD-10-CM | POA: Insufficient documentation

## 2022-02-23 DIAGNOSIS — L03116 Cellulitis of left lower limb: Secondary | ICD-10-CM | POA: Diagnosis present

## 2022-02-23 MED ORDER — DOXYCYCLINE HYCLATE 100 MG PO TABS: 100.0000 mg | ORAL_TABLET | Freq: Two times a day (BID) | ORAL | 0 refills | Status: DC

## 2022-02-23 NOTE — Patient Instructions (Addendum)
Doxcycline '100mg'$  2x a day for 10 days prescribed TDAP recommended with PCP If worsening pain go to emergency room See you again in 2-3 weeks

## 2022-02-23 NOTE — Progress Notes (Signed)
After obtaining consent, and per orders of Dr. Quay Burow, injection of Tdap given in the left  by Marrian Salvage. Patient tolerated well and instructed to report any adverse reaction to me immediately.

## 2022-02-23 NOTE — Assessment & Plan Note (Signed)
>>  ASSESSMENT AND PLAN FOR CELLULITIS WRITTEN ON 02/23/2022  4:39 PM BY Hulan Saas M, DO  Cellulitis of the lower extremity noted today.  Patient has had a tetanus shot a significant long time ago in 2008.  We will get this updated as well.  Started on doxycycline.  Continue with the daily changes with the bandaid and follow-up with me again in 3 weeks.  We did discuss elevation.  If worsening pain, darkening of the skin that is significant, or coldness to the toes patient should seek medical attention immediately.  Discussed this with him and his significant other.

## 2022-02-23 NOTE — Progress Notes (Signed)
[facility] [place] [address] [place] Phone: [phone] Subjective:   IVilma Meckel, am serving as a scribe for Dr. Hulan Saas.  I'm seeing this patient by the request  of:  [name], MD  CC: Leg pain  [id number]  Melvin [name] is a 76 y.o. male coming in with complaint of L foot injury. Patient states slipped on steps on deck. No pain, but brusing on left lower leg, top of foot, and in toes. There is some swelling in the foot.       Past Medical History:  Diagnosis Date   Arthritis    Cancer [facility])    Past Surgical History:  Procedure Laterality Date   INGUINAL HERNIA REPAIR Right 04/01/2021   Procedure: LAPAROSCOPIC INGUINAL HERNIA;  Surgeon: Ralene Ok, MD;  Location: [place];  Service: General;  Laterality: Right;   INGUINAL HERNIA REPAIR Left 07/09/2021   Procedure: OPEN LEFT INGUINAL HERNIA REPAIR WITH MESH;  Surgeon: Ralene Ok, MD;  Location: [place];  Service: General;  Laterality: Left;   INSERTION OF MESH Right 04/01/2021   Procedure: INSERTION OF MESH;  Surgeon: Ralene Ok, MD;  Location: [place];  Service: General;  Laterality: Right;   INSERTION OF MESH Left 07/09/2021   Procedure: INSERTION OF MESH;  Surgeon: Ralene Ok, MD;  Location: [place];  Service: General;  Laterality: Left;   LYMPHADENECTOMY Bilateral 09/12/2012   Procedure: LYMPHADENECTOMY;  Surgeon: Alexis Frock, MD;  Location: [facility];  Service: Urology;  Laterality: Bilateral;   ROBOT ASSISTED LAPAROSCOPIC RADICAL PROSTATECTOMY N/A 09/12/2012   Procedure: ROBOTIC ASSISTED LAPAROSCOPIC RADICAL PROSTATECTOMY;  Surgeon: Alexis Frock, MD;  Location: [facility];  Service: Urology;  Laterality: N/A;   TONSILLECTOMY     Social History   Socioeconomic History   Marital status: Married    Spouse name: Not on file   Number of children: Not on file   Years of education: Not on file   Highest education level: Not on file  Occupational  History   Not on file  Tobacco Use   Smoking status: Former    Types: Cigarettes    Quit date: 09/12/1992    Years since quitting: 29.4   Smokeless tobacco: Never  Vaping Use   Vaping Use: Never used  Substance and Sexual Activity   Alcohol use: Not Currently   Drug use: Never   Sexual activity: Not on file  Other Topics Concern   Not on file  Social History Narrative   Not on file   Social Determinants of Health   Financial Resource Strain: Not on file  Food Insecurity: Not on file  Transportation Needs: Not on file  Physical Activity: Not on file  Stress: Not on file  Social Connections: Not on file   Allergies  Allergen Reactions   Sulfa Antibiotics Hives   No family history on file.   Current Outpatient Medications (Cardiovascular):    EDEX 20 MCG injection, USE ONCE A DAY AS NEEDED.   Current Outpatient Medications (Analgesics):    ibuprofen (ADVIL) 200 MG tablet, Take 200 mg by mouth every 8 (eight) hours as needed (back pain.).   Current Outpatient Medications (Other):    doxycycline (VIBRA-TABS) 100 MG tablet, Take 1 tablet (100 mg total) by mouth 2 (two) times daily.   b complex vitamins capsule, Take 1 capsule by mouth 4 (four) times a week.   chlorhexidine (PERIDEX) 0.12 % solution, SMARTSIG:By Mouth   cimetidine (TAGAMET) 200  MG tablet, Take 200 mg by mouth 2 (two) times daily.   dorzolamidel-timolol (COSOPT PF) 22.3-6.8 MG/ML SOLN ophthalmic solution, Place 1 drop into both eyes 2 (two) times daily.   fluocinonide cream (LIDEX) 0.05 %, APPLY TO THE AFFECTED AREA ONCE A DAY.   ketoconazole (NIZORAL) 2 % cream, SMARTSIG:1 Topical Daily   linaclotide (LINZESS) 290 MCG CAPS capsule, Take 1 capsule (290 mcg total) by mouth daily as needed (constipation).   Multiple Vitamin (MULTIVITAMIN WITH MINERALS) TABS tablet, Take 1 tablet by mouth daily with lunch.   ofloxacin (OCUFLOX) 0.3 % ophthalmic solution, Place 1 drop into the left eye 4 (four) times daily  Please begin medication after scheduled procedure.   prednisoLONE acetate (PRED FORTE) 1 % ophthalmic suspension, Place 1 drop into the left eye 4 (four) times daily Please begin medication after scheduled procedure.   tacrolimus (PROTOPIC) 0.1 % ointment, Apply 1 Application topically 2 (two) times daily as needed.   Tafluprost, PF, (ZIOPTAN) 0.0015 % SOLN, Place 1 drop into both eyes at bedtime.   Reviewed prior external information including notes and imaging from  primary care provider As well as notes that were available from care everywhere and other healthcare systems.  Past medical history, social, surgical and family history all reviewed in electronic medical record.  No pertanent information unless stated regarding to the chief complaint.   Review of Systems:  No headache, visual changes, nausea, vomiting, diarrhea, constipation, dizziness, abdominal pain, skin rash, fevers, chills, night sweats, weight loss, swollen lymph nodes, body aches, joint swelling, chest pain, shortness of breath, mood changes. POSITIVE muscle aches  Objective  Blood pressure 138/84, pulse 76, height '6\' 1"'$  (1.854 m), weight 171 lb (77.6 kg), SpO2 98 %.   General: No apparent distress alert and oriented x3 mood and affect normal, dressed appropriately.  HEENT: Pupils equal, extraocular movements intact  No significant antalgic gait noted.  Patient does have some swelling and bruising of the lower extremity on the left side.  Severe overpronation of the ankle noted.  When removing the Band-Aids patient did have in place does appear that patient does have redness around the anterior tibia area.  Warm to touch.  No pus though formation noted.  Patient does have some bruising noted as well that seems to be going down the leg in varying degrees of healing process.  Seems to be mostly over the medial aspect.  Limited muscular skeletal ultrasound was performed and interpreted by Hulan Saas, M  Limited ultrasound  does have hypoechoic changes just above the bone.  Does have some increasing in Doppler flow in swelling that is consistent with potential cellulitis. Impression: Cellulitis of the lower extremity.    Impression and Recommendations:    The above documentation has been reviewed and is accurate and complete Lyndal Pulley, DO

## 2022-02-23 NOTE — Assessment & Plan Note (Addendum)
Cellulitis of the lower extremity noted today.  Patient has had a tetanus shot a significant long time ago in 2008.  We will get this updated as well.  Started on doxycycline.  Continue with the daily changes with the bandaid and follow-up with me again in 3 weeks.  We did discuss elevation.  If worsening pain, darkening of the skin that is significant, or coldness to the toes patient should seek medical attention immediately.  Discussed this with him and his significant other.

## 2022-02-28 NOTE — Progress Notes (Unsigned)
    Subjective:    Patient ID: Melvin [name], male    DOB: 1945-05-13, 76 y.o.   MRN: [id number]      HPI Melvin [name] is here for No chief complaint on file.  Saw Dr Tamala Julian 10/25 for left leg cellulitis - prescribed doxycycline x 10 days, tdap given  Leg wound still draining, feels weak -     Medications and allergies reviewed with patient and updated if appropriate.  Current Outpatient Medications on File Prior to Visit  Medication Sig Dispense Refill   b complex vitamins capsule Take 1 capsule by mouth 4 (four) times a week.     chlorhexidine (PERIDEX) 0.12 % solution SMARTSIG:By Mouth     cimetidine (TAGAMET) 200 MG tablet Take 200 mg by mouth 2 (two) times daily.     dorzolamidel-timolol (COSOPT PF) 22.3-6.8 MG/ML SOLN ophthalmic solution Place 1 drop into both eyes 2 (two) times daily.     doxycycline (VIBRA-TABS) 100 MG tablet Take 1 tablet (100 mg total) by mouth 2 (two) times daily. 20 tablet 0   EDEX 20 MCG injection USE ONCE A DAY AS NEEDED.     fluocinonide cream (LIDEX) 0.05 % APPLY TO THE AFFECTED AREA ONCE A DAY. 30 g 2   ibuprofen (ADVIL) 200 MG tablet Take 200 mg by mouth every 8 (eight) hours as needed (back pain.).     ketoconazole (NIZORAL) 2 % cream SMARTSIG:1 Topical Daily     linaclotide (LINZESS) 290 MCG CAPS capsule Take 1 capsule (290 mcg total) by mouth daily as needed (constipation). 30 capsule 4   Multiple Vitamin (MULTIVITAMIN WITH MINERALS) TABS tablet Take 1 tablet by mouth daily with lunch.     ofloxacin (OCUFLOX) 0.3 % ophthalmic solution Place 1 drop into the left eye 4 (four) times daily Please begin medication after scheduled procedure.     prednisoLONE acetate (PRED FORTE) 1 % ophthalmic suspension Place 1 drop into the left eye 4 (four) times daily Please begin medication after scheduled procedure.     tacrolimus (PROTOPIC) 0.1 % ointment Apply 1 Application topically 2 (two) times daily as needed.     Tafluprost, PF, (ZIOPTAN) 0.0015 % SOLN Place 1 drop  into both eyes at bedtime.     No current facility-administered medications on file prior to visit.    Review of Systems     Objective:  There were no vitals filed for this visit. BP Readings from Last 3 Encounters:  02/23/22 138/84  02/15/22 134/80  01/13/22 132/78   Wt Readings from Last 3 Encounters:  02/23/22 171 lb (77.6 kg)  02/15/22 168 lb 9.6 oz (76.5 kg)  01/13/22 171 lb (77.6 kg)   There is no height or weight on file to calculate BMI.    Physical Exam         Assessment & Plan:    See Problem List for Assessment and Plan of chronic medical problems.

## 2022-03-01 ENCOUNTER — Encounter: Payer: Self-pay | Admitting: Internal Medicine

## 2022-03-01 ENCOUNTER — Ambulatory Visit: Payer: BC Managed Care – PPO | Admitting: Emergency Medicine

## 2022-03-01 ENCOUNTER — Ambulatory Visit (INDEPENDENT_AMBULATORY_CARE_PROVIDER_SITE_OTHER): Payer: BC Managed Care – PPO | Admitting: Internal Medicine

## 2022-03-01 VITALS — BP 126/78 | HR 84 | Temp 98.9°F | Ht 73.0 in | Wt 176.0 lb

## 2022-03-01 DIAGNOSIS — R2689 Other abnormalities of gait and mobility: Secondary | ICD-10-CM | POA: Diagnosis not present

## 2022-03-01 DIAGNOSIS — R2681 Unsteadiness on feet: Secondary | ICD-10-CM

## 2022-03-01 DIAGNOSIS — L03116 Cellulitis of left lower limb: Secondary | ICD-10-CM | POA: Diagnosis not present

## 2022-03-01 DIAGNOSIS — R5381 Other malaise: Secondary | ICD-10-CM

## 2022-03-01 MED ORDER — DOXYCYCLINE HYCLATE 100 MG PO TABS: 100.0000 mg | ORAL_TABLET | Freq: Two times a day (BID) | ORAL | 0 refills | Status: AC

## 2022-03-01 NOTE — Assessment & Plan Note (Signed)
Healing well - still some erythema, warmth, tenderness around middle leg laceration concerning for active infection - has three more days of doxycycline left - will extend for a few more days to make sure infection has resolved Continue to apply antibacterial ointment and keep covered

## 2022-03-01 NOTE — Assessment & Plan Note (Signed)
As above.

## 2022-03-01 NOTE — Assessment & Plan Note (Signed)
Poor physical conditioning with poor balance, unsteady gait He is anxious about leaving the house, getting in/out of shower and about falling - Recent injury has made this worse Would benefit from PT - afraid to go out of house for PT Will see if we can get home PT High fall risk due to poor balance -- poor vision, chronic back pain, physical deconditioning all likely contributing -- will refer to neuro to make sure this is not anything else neurologically contributing

## 2022-03-01 NOTE — Patient Instructions (Addendum)
     A referral was ordered for home PT.    Medications changes include :   doxycycline for 5 additional days    A referral was ordered for [facility] neurology.       Someone will call you to schedule an appointment.

## 2022-03-01 NOTE — Assessment & Plan Note (Signed)
>>  ASSESSMENT AND PLAN FOR CELLULITIS WRITTEN ON 03/01/2022  6:34 PM BY BURNS, Claudina Lick, MD  Healing well - still some erythema, warmth, tenderness around middle leg laceration concerning for active infection - has three more days of doxycycline left - will extend for a few more days to make sure infection has resolved Continue to apply antibacterial ointment and keep covered

## 2022-03-02 ENCOUNTER — Encounter: Payer: Self-pay | Admitting: Internal Medicine

## 2022-03-08 ENCOUNTER — Ambulatory Visit (HOSPITAL_BASED_OUTPATIENT_CLINIC_OR_DEPARTMENT_OTHER): Payer: BC Managed Care – PPO | Admitting: Physical Therapy

## 2022-03-08 NOTE — Progress Notes (Signed)
[place] Phone: [phone] Subjective:   Melvin [name], am serving as a scribe for Dr. Hulan Saas.  I'm seeing this patient by the request  of:  [name], MD  CC: Leg pain follow-up  [id number]  02/23/2022 Cellulitis of the lower extremity noted today.  Patient has had a tetanus shot a significant long time ago in 2008.  We will get this updated as well.  Started on doxycycline.  Continue with the daily changes with the bandaid and follow-up with me again in 3 weeks.  We did discuss elevation.  If worsening pain, darkening of the skin that is significant, or coldness to the toes patient should seek medical attention immediately.  Discussed this with him and his significant other.   Update 03/10/2022 Melvin [name] is a 76 y.o. male coming in with complaint of L lower leg pain. Patient states that he is doing better. Took antibiotic and used 5 more days after an extension from his PCP.       Past Medical History:  Diagnosis Date   Arthritis    Cancer [facility])    Past Surgical History:  Procedure Laterality Date   INGUINAL HERNIA REPAIR Right 04/01/2021   Procedure: LAPAROSCOPIC INGUINAL HERNIA;  Surgeon: Ralene Ok, MD;  Location: [place];  Service: General;  Laterality: Right;   INGUINAL HERNIA REPAIR Left 07/09/2021   Procedure: OPEN LEFT INGUINAL HERNIA REPAIR WITH MESH;  Surgeon: Ralene Ok, MD;  Location: [place];  Service: General;  Laterality: Left;   INSERTION OF MESH Right 04/01/2021   Procedure: INSERTION OF MESH;  Surgeon: Ralene Ok, MD;  Location: [place];  Service: General;  Laterality: Right;   INSERTION OF MESH Left 07/09/2021   Procedure: INSERTION OF MESH;  Surgeon: Ralene Ok, MD;  Location: [place];  Service: General;  Laterality: Left;   LYMPHADENECTOMY Bilateral 09/12/2012   Procedure: LYMPHADENECTOMY;  Surgeon: Alexis Frock, MD;  Location: [facility];  Service:  Urology;  Laterality: Bilateral;   ROBOT ASSISTED LAPAROSCOPIC RADICAL PROSTATECTOMY N/A 09/12/2012   Procedure: ROBOTIC ASSISTED LAPAROSCOPIC RADICAL PROSTATECTOMY;  Surgeon: Alexis Frock, MD;  Location: [facility];  Service: Urology;  Laterality: N/A;   TONSILLECTOMY     Social History   Socioeconomic History   Marital status: Married    Spouse name: Not on file   Number of children: Not on file   Years of education: Not on file   Highest education level: Not on file  Occupational History   Not on file  Tobacco Use   Smoking status: Former    Types: Cigarettes    Quit date: 09/12/1992    Years since quitting: 29.5   Smokeless tobacco: Never  Vaping Use   Vaping Use: Never used  Substance and Sexual Activity   Alcohol use: Not Currently   Drug use: Never   Sexual activity: Not on file  Other Topics Concern   Not on file  Social History Narrative   Not on file   Social Determinants of Health   Financial Resource Strain: Not on file  Food Insecurity: Not on file  Transportation Needs: Not on file  Physical Activity: Not on file  Stress: Not on file  Social Connections: Not on file   Allergies  Allergen Reactions   Sulfa Antibiotics Hives   [name] family history on file.   Current Outpatient Medications (Cardiovascular):    EDEX 20 MCG injection, USE ONCE A DAY  AS NEEDED.   Current Outpatient Medications (Analgesics):    ibuprofen (ADVIL) 200 MG tablet, Take 200 mg by mouth every 8 (eight) hours as needed (back pain.).   Current Outpatient Medications (Other):    b complex vitamins capsule, Take 1 capsule by mouth 4 (four) times a week.   chlorhexidine (PERIDEX) 0.12 % solution, SMARTSIG:By Mouth   cimetidine (TAGAMET) 200 MG tablet, Take 200 mg by mouth 2 (two) times daily.   dorzolamidel-timolol (COSOPT PF) 22.3-6.8 MG/ML SOLN ophthalmic solution, Place 1 drop into both eyes 2 (two) times daily.   fluocinonide cream (LIDEX) 0.05 %, APPLY TO THE AFFECTED AREA ONCE  A DAY.   ketoconazole (NIZORAL) 2 % cream, SMARTSIG:1 Topical Daily   linaclotide (LINZESS) 290 MCG CAPS capsule, Take 1 capsule (290 mcg total) by mouth daily as needed (constipation).   Multiple Vitamin (MULTIVITAMIN WITH MINERALS) TABS tablet, Take 1 tablet by mouth daily with lunch.   ofloxacin (OCUFLOX) 0.3 % ophthalmic solution, Place 1 drop into the left eye 4 (four) times daily Please begin medication after scheduled procedure.   prednisoLONE acetate (PRED FORTE) 1 % ophthalmic suspension, Place 1 drop into the left eye 4 (four) times daily Please begin medication after scheduled procedure.   tacrolimus (PROTOPIC) 0.1 % ointment, Apply 1 Application topically 2 (two) times daily as needed.   Tafluprost, PF, (ZIOPTAN) 0.0015 % SOLN, Place 1 drop into both eyes at bedtime.   Reviewed prior external information including notes and imaging from  primary care provider As well as notes that were available from care everywhere and other healthcare systems.  Past medical history, social, surgical and family history all reviewed in electronic medical record.  [name] pertanent information unless stated regarding to the chief complaint.   Review of Systems:  [name] headache, visual changes, nausea, vomiting, diarrhea, constipation, dizziness, abdominal pain, skin rash, fevers, chills, night sweats, weight loss, swollen lymph nodes, joint swelling, chest pain, shortness of breath, mood changes. POSITIVE muscle aches, body aches  Objective  Blood pressure 130/84, pulse 69, height '6\' 1"'$  (1.854 m), weight 175 lb (79.4 kg), SpO2 97 %.   General: [name] apparent distress alert and oriented x3 mood and affect normal, dressed appropriately.  HEENT: Pupils equal, extraocular movements intact  Significant antalgic gait noted.  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  Patient also has severe scoliosis of the back. Anterior tibia appears to have good granulation tissue.  Patient  does have significant decrease in the cellulitic changes that was noted previously.  Still is tender to palpation of the one that is in the midshaft.  Nearly completely healed granulation tissue over the patella itself.    Impression and Recommendations:     The above documentation has been reviewed and is accurate and complete Lyndal Pulley, DO

## 2022-03-10 ENCOUNTER — Ambulatory Visit (INDEPENDENT_AMBULATORY_CARE_PROVIDER_SITE_OTHER): Payer: BC Managed Care – PPO | Admitting: Family Medicine

## 2022-03-10 VITALS — BP 130/84 | HR 69 | Ht 73.0 in | Wt 175.0 lb

## 2022-03-10 DIAGNOSIS — L03116 Cellulitis of left lower limb: Secondary | ICD-10-CM | POA: Diagnosis not present

## 2022-03-10 NOTE — Patient Instructions (Addendum)
Good to see you Use bandaid for 2-3 weeks over middle cut Use bandaid for 1 week on bottom cut See me in 4 weeks

## 2022-03-11 NOTE — Assessment & Plan Note (Signed)
>>  ASSESSMENT AND PLAN FOR CELLULITIS WRITTEN ON 03/11/2022  9:58 AM BY Hulan Saas M, DO  Patient cellulitis seems to be completely resolved at this time.  And even on ultrasound seems to be good but there is a small hematoma still noted.  Discussed with patient that we need to monitor.  Keep bandage over the area of the anterior tibia for the next 2 weeks still.  Should at this point to be completely closed.  Seek medical attention if any more redness occurs or any fevers or chills follow-up with me again in 4 weeks.  Discussed this treatment with patient in great detail total time 33 minutes

## 2022-03-11 NOTE — Assessment & Plan Note (Addendum)
Patient cellulitis seems to be completely resolved at this time.  And even on ultrasound seems to be good but there is a small hematoma still noted.  Discussed with patient that we need to monitor.  Keep bandage over the area of the anterior tibia for the next 2 weeks still.  Should at this point to be completely closed.  Seek medical attention if any more redness occurs or any fevers or chills follow-up with me again in 4 weeks.  Discussed this treatment with patient in great detail total time 33 minutes

## 2022-03-15 ENCOUNTER — Encounter: Payer: Self-pay | Admitting: Neurology

## 2022-03-16 ENCOUNTER — Ambulatory Visit (HOSPITAL_BASED_OUTPATIENT_CLINIC_OR_DEPARTMENT_OTHER): Payer: Self-pay | Admitting: Physical Therapy

## 2022-03-18 ENCOUNTER — Ambulatory Visit (HOSPITAL_BASED_OUTPATIENT_CLINIC_OR_DEPARTMENT_OTHER): Payer: Self-pay | Admitting: Physical Therapy

## 2022-03-21 ENCOUNTER — Ambulatory Visit (HOSPITAL_BASED_OUTPATIENT_CLINIC_OR_DEPARTMENT_OTHER): Payer: Self-pay | Admitting: Physical Therapy

## 2022-03-23 ENCOUNTER — Ambulatory Visit (HOSPITAL_BASED_OUTPATIENT_CLINIC_OR_DEPARTMENT_OTHER): Payer: Self-pay | Admitting: Physical Therapy

## 2022-03-29 ENCOUNTER — Ambulatory Visit (HOSPITAL_BASED_OUTPATIENT_CLINIC_OR_DEPARTMENT_OTHER): Payer: Self-pay | Admitting: Physical Therapy

## 2022-03-29 NOTE — Progress Notes (Unsigned)
Melvin [name] [place] Phone: [phone] Subjective:    I'm seeing this patient by the request  of:  [name], MD  CC: Leg infection follow-up as well as difficulty with balance  [id number]  03/10/2022 Patient cellulitis seems to be completely resolved at this time.  And even on ultrasound seems to be good but there is a small hematoma still noted.  Discussed with patient that we need to monitor.  Keep bandage over the area of the anterior tibia for the next 2 weeks still.  Should at this point to be completely closed.  Seek medical attention if any more redness occurs or any fevers or chills follow-up with me again in 4 weeks.  Discussed this treatment with patient in great detail total time 33 minutes      Update 03/31/2022 Melvin [name] is a 76 y.o. male coming in with complaint of L foot pain. Patient states his balance is not doing well, has an appointment with neurology next week. The left leg is doing better but not heeled so he would like the leg looked at.        Past Medical History:  Diagnosis Date   Arthritis    Cancer [facility])    Past Surgical History:  Procedure Laterality Date   INGUINAL HERNIA REPAIR Right 04/01/2021   Procedure: LAPAROSCOPIC INGUINAL HERNIA;  Surgeon: Ralene Ok, MD;  Location: [place];  Service: General;  Laterality: Right;   INGUINAL HERNIA REPAIR Left 07/09/2021   Procedure: OPEN LEFT INGUINAL HERNIA REPAIR WITH MESH;  Surgeon: Ralene Ok, MD;  Location: [place];  Service: General;  Laterality: Left;   INSERTION OF MESH Right 04/01/2021   Procedure: INSERTION OF MESH;  Surgeon: Ralene Ok, MD;  Location: [place];  Service: General;  Laterality: Right;   INSERTION OF MESH Left 07/09/2021   Procedure: INSERTION OF MESH;  Surgeon: Ralene Ok, MD;  Location: [place];  Service: General;  Laterality: Left;   LYMPHADENECTOMY Bilateral 09/12/2012   Procedure:  LYMPHADENECTOMY;  Surgeon: Alexis Frock, MD;  Location[facility];  Service: Urology;  Laterality: Bilateral;   ROBOT ASSISTED LAPAROSCOPIC RADICAL PROSTATECTOMY N/A 09/12/2012   Procedure: ROBOTIC ASSISTED LAPAROSCOPIC RADICAL PROSTATECTOMY;  Surgeon: Alexis Frock, MD;  Location[facility];  Service: Urology;  Laterality: N/A;   TONSILLECTOMY     Social History   Socioeconomic History   Marital status: Married    Spouse name: Not on file   Number of children: Not on file   Years of education: Not on file   Highest education level: Not on file  Occupational History   Not on file  Tobacco Use   Smoking status: Former    Types: Cigarettes    Quit date: 09/12/1992    Years since quitting: 29.5   Smokeless tobacco: Never  Vaping Use   Vaping Use: Never used  Substance and Sexual Activity   Alcohol use: Not Currently   Drug use: Never   Sexual activity: Not on file  Other Topics Concern   Not on file  Social History Narrative   Not on file   Social Determinants of Health   Financial Resource Strain: Not on file  Food Insecurity: Not on file  Transportation Needs: Not on file  Physical Activity: Not on file  Stress: Not on file  Social Connections: Not on file   Allergies  Allergen Reactions   Sulfa Antibiotics Hives   No family history on  file.   Current Outpatient Medications (Cardiovascular):    EDEX 20 MCG injection, USE ONCE A DAY AS NEEDED.   Current Outpatient Medications (Analgesics):    ibuprofen (ADVIL) 200 MG tablet, Take 200 mg by mouth every 8 (eight) hours as needed (back pain.).   Current Outpatient Medications (Other):    b complex vitamins capsule, Take 1 capsule by mouth 4 (four) times a week.   chlorhexidine (PERIDEX) 0.12 % solution, SMARTSIG:By Mouth   cimetidine (TAGAMET) 200 MG tablet, Take 200 mg by mouth 2 (two) times daily.   dorzolamidel-timolol (COSOPT PF) 22.3-6.8 MG/ML SOLN ophthalmic solution, Place 1 drop into both eyes 2 (two) times  daily.   doxycycline (VIBRA-TABS) 100 MG tablet, Take 1 tablet (100 mg total) by mouth daily.   fluocinonide cream (LIDEX) 0.05 %, APPLY TO THE AFFECTED AREA ONCE A DAY.   ketoconazole (NIZORAL) 2 % cream, SMARTSIG:1 Topical Daily   linaclotide (LINZESS) 290 MCG CAPS capsule, Take 1 capsule (290 mcg total) by mouth daily as needed (constipation).   Multiple Vitamin (MULTIVITAMIN WITH MINERALS) TABS tablet, Take 1 tablet by mouth daily with lunch.   ofloxacin (OCUFLOX) 0.3 % ophthalmic solution, Place 1 drop into the left eye 4 (four) times daily Please begin medication after scheduled procedure.   prednisoLONE acetate (PRED FORTE) 1 % ophthalmic suspension, Place 1 drop into the left eye 4 (four) times daily Please begin medication after scheduled procedure.   tacrolimus (PROTOPIC) 0.1 % ointment, Apply 1 Application topically 2 (two) times daily as needed.   Tafluprost, PF, (ZIOPTAN) 0.0015 % SOLN, Place 1 drop into both eyes at bedtime.   Reviewed prior external information including notes and imaging from  primary care provider As well as notes that were available from care everywhere and other healthcare systems.  Past medical history, social, surgical and family history all reviewed in electronic medical record.  No pertanent information unless stated regarding to the chief complaint.   Review of Systems:  No headache, visual changes, nausea, vomiting, diarrhea, constipation, dizziness, abdominal pain, skin rash, fevers, chills, night sweats, weight loss, swollen lymph nodes, body aches, joint swelling, chest pain, shortness of breath, mood changes. POSITIVE muscle aches  Objective  Blood pressure (!) 130/94, pulse 68, height '6\' 1"'$  (1.854 m), weight 175 lb (79.4 kg), SpO2 98 %.   General: No apparent distress alert and oriented x3 mood and affect normal, dressed appropriately.  HEENT: Pupils equal, extraocular movements intact  Respiratory: Patient's speak in full sentences and does  not appear short of breath  Patient's gait seems to be worse.  Patient does have a mild shuffling gait noted.  Severe overpronation of the feet bilaterally.  On exam today to patient does have a resting tremor of the left upper extremity noted.  Significant arthritic changes of the back noted with increasing kyphosis as well as scoliosis of the lumbar spine.  Only 5 degrees of extension.   Patient's left leg does have what appears to be a mild cellulitis starting again at this time on the anterior tibial area.  Mild warmness to touch.  Does have some mild discharge coming from the area.   Impression and Recommendations:     The above documentation has been reviewed and is accurate and complete Lyndal Pulley, DO

## 2022-03-31 ENCOUNTER — Ambulatory Visit (INDEPENDENT_AMBULATORY_CARE_PROVIDER_SITE_OTHER): Payer: BC Managed Care – PPO | Admitting: Family Medicine

## 2022-03-31 VITALS — BP 130/94 | HR 68 | Ht 73.0 in | Wt 175.0 lb

## 2022-03-31 DIAGNOSIS — R2681 Unsteadiness on feet: Secondary | ICD-10-CM

## 2022-03-31 DIAGNOSIS — L03116 Cellulitis of left lower limb: Secondary | ICD-10-CM | POA: Diagnosis not present

## 2022-03-31 DIAGNOSIS — R2689 Other abnormalities of gait and mobility: Secondary | ICD-10-CM | POA: Diagnosis not present

## 2022-03-31 MED ORDER — DOXYCYCLINE HYCLATE 100 MG PO TABS: 100.0000 mg | ORAL_TABLET | Freq: Every day | ORAL | 0 refills | Status: DC

## 2022-03-31 NOTE — Progress Notes (Signed)
Initial neurology clinic note  SERVICE DATE: 04/05/22  Reason for Evaluation: Consultation requested by [name], MD for an opinion regarding imbalance, unsteady gait. My final recommendations will be communicated back to the requesting physician by way of shared medical record or letter to requesting physician via [place] mail.  HPI: This is Mr. Melvin [name], a 76 y.o. male with a medical history of left leg cellulitis (01/2022), OA, low back pain, prostate cancer, scoliosis who presents to neurology clinic with the chief complaint of imbalance and unsteady gait. The patient is accompanied by wife.  Patient has had difficulty with balance and stability. Per wife, she first noticed he was slow and difficulty walking in spring. He was walking without a cane at that time though. 02/15/22 patient was outside with dog in back deck. He slipped off the steps and into the leg and scraped the left leg in 3 places. He developed cellulitis in left leg. He has been on antibiotics (he continues to be on doxycycline). This has been patient's only fall. He walking seems worse in the morning and improves as the day goes on per wife. He freezes while walking occasionally.  He denies weakness in arms. He has had tremor that also started in the spring (left hand). Wife notices it more at rest.  He has not noticed any twitching or cramps.  He endorses low back pain.  Wife has also noticed some confusion. She mentions being on vacation in [place]. He came out of the bathroom and didn't know where he was. This lasted about 5 minutes. Wife feels like it occurs more at rest.  He endorses poor vision with multiple surgeries. He denies diplopia or ptosis. Per wife, the eyelids have always been asymmetric. He denies difficulties swallowing or chewing.  Wife has noticed a slight change in voice lately but more since his leg infection and being tired.  Patient sleeps well at night. He does not snore per wife. He  takes one nap/or just laying down for about 20 minutes per day.   Patient endorses some poor mood due to poor health over the last year. He feels down at times.  Patient is currently getting home therapy. He also does the home exercises. He has been doing therapy since just before Thanksgiving and feels like it may be helping.  Patient denies significant weight changes.  Of note, patient takes a B complex and multivitamin most days.  EtOH use: None  Restrictive diet? No Family history of neuropathy/myopathy/NM disease? No  Patient has never had an EMG.   MEDICATIONS:  Outpatient Encounter Medications as of 04/05/2022  Medication Sig   b complex vitamins capsule Take 1 capsule by mouth 4 (four) times a week.   dorzolamidel-timolol (COSOPT PF) 22.3-6.8 MG/ML SOLN ophthalmic solution Place 1 drop into both eyes 2 (two) times daily.   Tafluprost, PF, (ZIOPTAN) 0.0015 % SOLN Place 1 drop into both eyes at bedtime.   chlorhexidine (PERIDEX) 0.12 % solution SMARTSIG:By Mouth (Patient not taking: Reported on 04/05/2022)   cimetidine (TAGAMET) 200 MG tablet Take 200 mg by mouth 2 (two) times daily.   doxycycline (VIBRA-TABS) 100 MG tablet Take 1 tablet (100 mg total) by mouth daily.   EDEX 20 MCG injection USE ONCE A DAY AS NEEDED.   fluocinonide cream (LIDEX) 0.05 % APPLY TO THE AFFECTED AREA ONCE A DAY.   ibuprofen (ADVIL) 200 MG tablet Take 200 mg by mouth every 8 (eight) hours as needed (back pain.).  ketoconazole (NIZORAL) 2 % cream SMARTSIG:1 Topical Daily   linaclotide (LINZESS) 290 MCG CAPS capsule Take 1 capsule (290 mcg total) by mouth daily as needed (constipation).   Multiple Vitamin (MULTIVITAMIN WITH MINERALS) TABS tablet Take 1 tablet by mouth daily with lunch.   ofloxacin (OCUFLOX) 0.3 % ophthalmic solution Place 1 drop into the left eye 4 (four) times daily Please begin medication after scheduled procedure.   prednisoLONE acetate (PRED FORTE) 1 % ophthalmic suspension Place 1  drop into the left eye 4 (four) times daily Please begin medication after scheduled procedure.   tacrolimus (PROTOPIC) 0.1 % ointment Apply 1 Application topically 2 (two) times daily as needed.   No facility-administered encounter medications on file as of 04/05/2022.    PAST MEDICAL HISTORY: Past Medical History:  Diagnosis Date   Arthritis    Cancer [facility])     PAST SURGICAL HISTORY: Past Surgical History:  Procedure Laterality Date   INGUINAL HERNIA REPAIR Right 04/01/2021   Procedure: LAPAROSCOPIC INGUINAL HERNIA;  Surgeon: Ralene Ok, MD;  Location: [place];  Service: General;  Laterality: Right;   INGUINAL HERNIA REPAIR Left 07/09/2021   Procedure: OPEN LEFT INGUINAL HERNIA REPAIR WITH MESH;  Surgeon: Ralene Ok, MD;  Location: [place];  Service: General;  Laterality: Left;   INSERTION OF MESH Right 04/01/2021   Procedure: INSERTION OF MESH;  Surgeon: Ralene Ok, MD;  Location: [place];  Service: General;  Laterality: Right;   INSERTION OF MESH Left 07/09/2021   Procedure: INSERTION OF MESH;  Surgeon: Ralene Ok, MD;  Location: [place];  Service: General;  Laterality: Left;   LYMPHADENECTOMY Bilateral 09/12/2012   Procedure: LYMPHADENECTOMY;  Surgeon: Alexis Frock, MD;  Location: [facility];  Service: Urology;  Laterality: Bilateral;   ROBOT ASSISTED LAPAROSCOPIC RADICAL PROSTATECTOMY N/A 09/12/2012   Procedure: ROBOTIC ASSISTED LAPAROSCOPIC RADICAL PROSTATECTOMY;  Surgeon: Alexis Frock, MD;  Location: [facility];  Service: Urology;  Laterality: N/A;   TONSILLECTOMY      ALLERGIES: Allergies  Allergen Reactions   Sulfa Antibiotics Hives    FAMILY HISTORY: No family history on file.  SOCIAL HISTORY: Social History   Tobacco Use   Smoking status: Former    Types: Cigarettes    Quit date: 09/12/1992    Years since quitting: 29.5   Smokeless tobacco: Never  Vaping Use   Vaping Use: Never used  Substance Use Topics   Alcohol use: Not Currently   Drug use:  Never   Social History   Social History Narrative   Are you right handed or left handed? Right   Are you currently employed ? [employer]    What is your current occupation?   Do you live at home alone? wife   Who lives with you?    What type of home do you live in: 1 story or 2 story? Two but don't go up         OBJECTIVE: PHYSICAL EXAM: Pulse 86   Ht '6\' 1"'$  (1.854 m)   Wt 173 lb 12.8 oz (78.8 kg)   SpO2 98%   BMI 22.93 kg/m   General: General appearance: Awake and alert. No distress. Cooperative with exam.  Skin: No obvious rash or jaundice. HEENT: Atraumatic. Anicteric. Lungs: Non-labored breathing on room air  Extremities: Bilateral peripheral edema in legs.  Psych: Flat affect  Neurological: Mental Status: Alert. Speech fluent. No pseudobulbar affect Cranial Nerves: CNII: No RAPD. Visual fields grossly intact. CNIII, IV, VI: PERRL. No nystagmus. Choppy pursuit. Reduced  up gaze. CN V: Facial sensation intact bilaterally to fine touch. [place] clench strong. Jaw jerk negative. CN VII: Facial muscles symmetric and strong. No ptosis at rest. CN VIII: Hearing grossly intact bilaterally. CN IX: No hypophonia. CN X: Palate elevates symmetrically. CN XI: Full strength shoulder shrug bilaterally. CN XII: Tongue protrusion full and midline. No atrophy or fasciculations. No significant dysarthria Motor: Tone is increased in all extremities with cogwheeling. Rest tremor in left hand. No fasciculations in extremities. No atrophy.  Individual muscle group testing (MRC grade out of 5):  Movement     Neck flexion 5    Neck extension 5     Right Left   Shoulder abduction 5 5   Shoulder adduction 5 5   Shoulder ext rotation 5 5   Shoulder int rotation 5 5   Elbow flexion 5 5   Elbow extension 5 5   Wrist extension 5 5   Wrist flexion 5 5   Finger abduction - FDI 5 5   Finger abduction - ADM 5 5   Finger extension 5 5   Finger distal flexion - 2/'3 5 5    '$ Finger distal flexion - 4/'5 5 5   '$ Thumb flexion - FPL 5 5   Thumb abduction - APB 5 5    Hip flexion 5 5   Hip extension 5 5   Hip adduction 5 5   Hip abduction 5 5   Knee extension 5 5   Knee flexion 5 5   Dorsiflexion 5 5   Plantarflexion 5 5     Reflexes:  Right Left   Bicep 2+ 2+   Tricep 2+ 2+   BrRad 2+ 2+   Knee 2+ 2+   Ankle 0 0    Pathological Reflexes: Babinski: flexor response bilaterally Hoffman: absent bilaterally Troemner: absent bilaterally Palmomental: absent bilaterally Facial: absent bilaterally Midline tap: absent Sensation: Pinprick: Intact in all extremities Coordination: Intact finger-to- nose-finger bilaterally. Romberg negative. Finger tapping, toe tapping and RAM with pronation abnormal, left side worse than right. Gait: Able to rise from chair with arms crossed unassisted. Stooped posture. Walks with cane. Shuffling gait, cautious steps. En [place]oc turning.  Lab and Test Review: Internal labs: Normal or unremarkable (01/2022): CBC, CMP, TSH HbA1c (02/15/22): 5.7  MRI lumbar spine wo contrast (04/14/21): FINDINGS: Segmentation:  Standard.   Alignment: Unchanged levoscoliosis. Mild retrolisthesis at L1-L2, L2-L3, and L3-L4.   Vertebrae: No fracture, evidence of discitis, or bone lesion. Asymmetric degenerative endplate marrow edema at L1-L2 and L3-L4.   Conus medullaris and cauda equina: Conus extends to the L1 level. Conus and cauda equina appear normal.   Paraspinal and other soft tissues: Negative.   Disc levels:   T12-L1: Minimal disc bulging. Mild bilateral facet arthropathy. No stenosis.   L1-L2: Circumferential disc osteophyte complex asymmetric to the left. Mild bilateral facet arthropathy. Mild left lateral recess and bilateral neuroforaminal stenosis. No spinal canal stenosis.   L2-L3: Bulky right far lateral disc osteophyte complex. Right-greater-than-left facet arthropathy. Mild right lateral recess stenosis. Moderate  right neuroforaminal stenosis. No spinal canal or left neuroforaminal stenosis.   L3-L4: Right far lateral disc osteophyte complex. Mild bilateral facet arthropathy. Mild right lateral recess and bilateral neuroforaminal stenosis. No spinal canal stenosis.   L4-L5: Right-sided disc uncovering and disc bulging extending into the right neural foramen. Severe bilateral facet arthropathy. Moderate spinal canal and severe bilateral lateral recess stenosis. Moderate to severe right and moderate left neuroforaminal stenosis.   L5-S1:  Small posterior and left far lateral disc osteophyte complexes. Severe right and mild left facet arthropathy. Moderate left and mild right lateral recess stenosis. Moderate to severe left neuroforaminal stenosis. No spinal canal or right neuroforaminal stenosis.   IMPRESSION: 1. Advanced multilevel degenerative changes of the lumbar spine as described above, worst at L4-L5.  ASSESSMENT: Melvin [name] is a 76 y.o. male who presents for evaluation of gait imbalance. He has a relevant medical history of left leg cellulitis (01/2022), OA, low back pain, prostate cancer, scoliosis. His neurological examination is pertinent for bradykinesia, rest tremor, rigidity of all extremities, gait imbalance, en-bloc turns. Available diagnostic data is significant for MRI lumbar spine showing significant lumbosacral stenosis and neural foraminal stenosis. Patient's history and examination are most consistent with parkinsonism, perhaps idiopathic Parkinson's disease. I discussed the concern and natural history with patient today. I will evaluate for potential mimics as below. Patient elected to trial Sinemet for response as discussion.  PLAN: -Blood work: B12, B1, copper, vit E -MRI brain and cervical spine wo contrast -Sinemet trial: 1/2 tablet TID -Continue home PT  -Return to clinic in 1 month  The impression above as well as the plan as outlined below were extensively  discussed with the patient (in the company of wife) who voiced understanding. All questions were answered to their satisfaction.  The patient was counseled on pertinent fall precautions per the printed material provided today, and as noted under the "Patient Instructions" section below.  When available, results of the above investigations and possible further recommendations will be communicated to the patient via telephone/MyChart. Patient to call office if not contacted after expected testing turnaround time.   Total time spent reviewing records, interview, history/exam, documentation, and coordination of care on day of encounter:  75 min   Thank you for allowing me to participate in patient's care.  If I can answer any additional questions, I would be pleased to do so.  Kai Levins, MD   CC: [name], MD [place] [address]  CC: Referring provider: [name], MD [address] [place],  [place] [address]

## 2022-03-31 NOTE — Assessment & Plan Note (Signed)
>>  ASSESSMENT AND PLAN FOR CELLULITIS WRITTEN ON 03/31/2022 12:17 PM BY Hulan Saas M, DO  Worsening cellulitis again.  Will put patient back on doxycycline.  Warned of potential side effects.  Will do it another 2 weeks to see if this will improve.  Follow-up again in 2 weeks for further evaluation.

## 2022-03-31 NOTE — Patient Instructions (Signed)
Good to see you Doxy '100mg'$  BID 14 days I will contact Dr Berdine Addison regarding next steps Will need to look at it again in 2 weeks  Okay to double book.

## 2022-03-31 NOTE — Assessment & Plan Note (Signed)
Patient does have the unsteady gait as well as poor balance at the moment.  Patient has noticed this since his fall but I do think it was likely contributed to the fall.  Patient does have a worsening resting tremor of the left upper extremity.  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  We discussed with patient that there are different treatment options but he is seeing neurology in the next week.  We discussed the potential for different test such as an MRI of the brain to make sure there is nothing there or an EMG.  We did discuss even the possibility of a motor dysfunction such as Parkinson's being a potential cause.  Patient is with his wife and all questions were answered and we discussed and patient will follow-up with neurology to discuss further.

## 2022-03-31 NOTE — Assessment & Plan Note (Signed)
Worsening cellulitis again.  Will put patient back on doxycycline.  Warned of potential side effects.  Will do it another 2 weeks to see if this will improve.  Follow-up again in 2 weeks for further evaluation.

## 2022-04-04 ENCOUNTER — Ambulatory Visit (HOSPITAL_BASED_OUTPATIENT_CLINIC_OR_DEPARTMENT_OTHER): Payer: Self-pay | Admitting: Physical Therapy

## 2022-04-05 ENCOUNTER — Other Ambulatory Visit: Payer: Self-pay

## 2022-04-05 ENCOUNTER — Encounter: Payer: Self-pay | Admitting: Neurology

## 2022-04-05 ENCOUNTER — Other Ambulatory Visit (INDEPENDENT_AMBULATORY_CARE_PROVIDER_SITE_OTHER): Payer: BC Managed Care – PPO

## 2022-04-05 ENCOUNTER — Emergency Department (HOSPITAL_COMMUNITY): Payer: BC Managed Care – PPO

## 2022-04-05 ENCOUNTER — Ambulatory Visit (INDEPENDENT_AMBULATORY_CARE_PROVIDER_SITE_OTHER): Payer: BC Managed Care – PPO | Admitting: Neurology

## 2022-04-05 ENCOUNTER — Inpatient Hospital Stay (HOSPITAL_COMMUNITY)
Admission: EM | Admit: 2022-04-05 | Discharge: 2022-04-14 | DRG: 872 | Disposition: A | Discharge status: Rehabilitation Facility | Payer: BC Managed Care – PPO | Attending: Family Medicine | Admitting: Family Medicine

## 2022-04-05 VITALS — BP 106/78 | HR 86 | Ht 73.0 in | Wt 173.8 lb

## 2022-04-05 DIAGNOSIS — Z882 Allergy status to sulfonamides status: Secondary | ICD-10-CM | POA: Diagnosis not present

## 2022-04-05 DIAGNOSIS — W19XXXA Unspecified fall, initial encounter: Secondary | ICD-10-CM | POA: Diagnosis present

## 2022-04-05 DIAGNOSIS — G8929 Other chronic pain: Secondary | ICD-10-CM | POA: Diagnosis present

## 2022-04-05 DIAGNOSIS — M48061 Spinal stenosis, lumbar region without neurogenic claudication: Secondary | ICD-10-CM | POA: Diagnosis present

## 2022-04-05 DIAGNOSIS — A409 Streptococcal sepsis, unspecified: Secondary | ICD-10-CM | POA: Diagnosis not present

## 2022-04-05 DIAGNOSIS — S80812A Abrasion, left lower leg, initial encounter: Secondary | ICD-10-CM | POA: Diagnosis present

## 2022-04-05 DIAGNOSIS — R7881 Bacteremia: Secondary | ICD-10-CM | POA: Diagnosis not present

## 2022-04-05 DIAGNOSIS — R2681 Unsteadiness on feet: Secondary | ICD-10-CM | POA: Diagnosis present

## 2022-04-05 DIAGNOSIS — E871 Hypo-osmolality and hyponatremia: Secondary | ICD-10-CM | POA: Diagnosis not present

## 2022-04-05 DIAGNOSIS — R54 Age-related physical debility: Secondary | ICD-10-CM | POA: Diagnosis present

## 2022-04-05 DIAGNOSIS — R258 Other abnormal involuntary movements: Secondary | ICD-10-CM

## 2022-04-05 DIAGNOSIS — A401 Sepsis due to streptococcus, group B: Secondary | ICD-10-CM | POA: Diagnosis present

## 2022-04-05 DIAGNOSIS — R269 Unspecified abnormalities of gait and mobility: Secondary | ICD-10-CM | POA: Diagnosis present

## 2022-04-05 DIAGNOSIS — F05 Delirium due to known physiological condition: Secondary | ICD-10-CM | POA: Diagnosis not present

## 2022-04-05 DIAGNOSIS — R29898 Other symptoms and signs involving the musculoskeletal system: Secondary | ICD-10-CM

## 2022-04-05 DIAGNOSIS — Z1152 Encounter for screening for COVID-19: Secondary | ICD-10-CM

## 2022-04-05 DIAGNOSIS — M1990 Unspecified osteoarthritis, unspecified site: Secondary | ICD-10-CM | POA: Diagnosis present

## 2022-04-05 DIAGNOSIS — Y92239 Unspecified place in hospital as the place of occurrence of the external cause: Secondary | ICD-10-CM | POA: Diagnosis not present

## 2022-04-05 DIAGNOSIS — Z79899 Other long term (current) drug therapy: Secondary | ICD-10-CM

## 2022-04-05 DIAGNOSIS — I739 Peripheral vascular disease, unspecified: Secondary | ICD-10-CM | POA: Diagnosis present

## 2022-04-05 DIAGNOSIS — M419 Scoliosis, unspecified: Secondary | ICD-10-CM | POA: Diagnosis present

## 2022-04-05 DIAGNOSIS — R2689 Other abnormalities of gait and mobility: Secondary | ICD-10-CM

## 2022-04-05 DIAGNOSIS — L03116 Cellulitis of left lower limb: Secondary | ICD-10-CM | POA: Diagnosis not present

## 2022-04-05 DIAGNOSIS — M47816 Spondylosis without myelopathy or radiculopathy, lumbar region: Secondary | ICD-10-CM | POA: Diagnosis present

## 2022-04-05 DIAGNOSIS — G20A1 Parkinson's disease without dyskinesia, without mention of fluctuations: Secondary | ICD-10-CM | POA: Diagnosis present

## 2022-04-05 DIAGNOSIS — Z792 Long term (current) use of antibiotics: Secondary | ICD-10-CM | POA: Diagnosis not present

## 2022-04-05 DIAGNOSIS — T428X5A Adverse effect of antiparkinsonism drugs and other central muscle-tone depressants, initial encounter: Secondary | ICD-10-CM | POA: Diagnosis not present

## 2022-04-05 DIAGNOSIS — H409 Unspecified glaucoma: Secondary | ICD-10-CM | POA: Diagnosis present

## 2022-04-05 DIAGNOSIS — L03119 Cellulitis of unspecified part of limb: Secondary | ICD-10-CM | POA: Diagnosis present

## 2022-04-05 DIAGNOSIS — R443 Hallucinations, unspecified: Secondary | ICD-10-CM | POA: Insufficient documentation

## 2022-04-05 DIAGNOSIS — Z8719 Personal history of other diseases of the digestive system: Secondary | ICD-10-CM | POA: Diagnosis not present

## 2022-04-05 DIAGNOSIS — Z87891 Personal history of nicotine dependence: Secondary | ICD-10-CM | POA: Diagnosis not present

## 2022-04-05 DIAGNOSIS — K219 Gastro-esophageal reflux disease without esophagitis: Secondary | ICD-10-CM | POA: Diagnosis present

## 2022-04-05 DIAGNOSIS — A419 Sepsis, unspecified organism: Secondary | ICD-10-CM

## 2022-04-05 DIAGNOSIS — Z9079 Acquired absence of other genital organ(s): Secondary | ICD-10-CM

## 2022-04-05 DIAGNOSIS — G252 Other specified forms of tremor: Secondary | ICD-10-CM

## 2022-04-05 DIAGNOSIS — Z8546 Personal history of malignant neoplasm of prostate: Secondary | ICD-10-CM

## 2022-04-05 LAB — COMPREHENSIVE METABOLIC PANEL
ALT: 21 U/L (ref 0–44)
AST: 33 U/L (ref 15–41)
Albumin: 2.9 g/dL — ABNORMAL LOW (ref 3.5–5.0)
Alkaline Phosphatase: 45 U/L (ref 38–126)
Anion gap: 8 (ref 5–15)
BUN: 23 mg/dL (ref 8–23)
CO2: 22 mmol/L (ref 22–32)
Calcium: 8.4 mg/dL — ABNORMAL LOW (ref 8.9–10.3)
Chloride: 107 mmol/L (ref 98–111)
Creatinine, Ser: 1.12 mg/dL (ref 0.61–1.24)
GFR, Estimated: >60 mL/min (ref >60)
Glucose, Bld: 158 mg/dL — ABNORMAL HIGH (ref 70–99)
Potassium: 3.9 mmol/L (ref 3.5–5.1)
Sodium: 137 mmol/L (ref 135–145)
Total Bilirubin: 1.2 mg/dL (ref 0.3–1.2)
Total Protein: 5 g/dL — ABNORMAL LOW (ref 6.5–8.1)

## 2022-04-05 LAB — CBC WITH DIFFERENTIAL/PLATELET
Abs Immature Granulocytes: 0.11 10*3/uL — ABNORMAL HIGH (ref 0.00–0.07)
Basophils Absolute: 0 10*3/uL (ref 0.0–0.1)
Basophils Relative: 0 %
Eosinophils Absolute: 0 10*3/uL (ref 0.0–0.5)
Eosinophils Relative: 0 %
HCT: 37.6 % — ABNORMAL LOW (ref 39.0–52.0)
Hemoglobin: 13.3 g/dL (ref 13.0–17.0)
Immature Granulocytes: 1 %
Lymphocytes Relative: 3 %
Lymphs Abs: 0.3 10*3/uL — ABNORMAL LOW (ref 0.7–4.0)
MCH: 34 pg (ref 26.0–34.0)
MCHC: 35.4 g/dL (ref 30.0–36.0)
MCV: 96.2 fL (ref 80.0–100.0)
Monocytes Absolute: 0.4 10*3/uL (ref 0.1–1.0)
Monocytes Relative: 3 %
Neutro Abs: 10.9 10*3/uL — ABNORMAL HIGH (ref 1.7–7.7)
Neutrophils Relative %: 93 %
Platelets: 164 10*3/uL (ref 150–400)
RBC: 3.91 MIL/uL — ABNORMAL LOW (ref 4.22–5.81)
RDW: 12.6 % (ref 11.5–15.5)
WBC: 11.7 10*3/uL — ABNORMAL HIGH (ref 4.0–10.5)
nRBC: 0 % (ref 0.0–0.2)

## 2022-04-05 LAB — LACTIC ACID, PLASMA
Lactic Acid, Venous: 1.4 mmol/L (ref 0.5–1.9)
Lactic Acid, Venous: 1 mmol/L (ref 0.5–1.9)

## 2022-04-05 LAB — URINALYSIS, ROUTINE W REFLEX MICROSCOPIC
Bilirubin Urine: NEGATIVE
Glucose, UA: NEGATIVE mg/dL
Hgb urine dipstick: NEGATIVE
Ketones, ur: NEGATIVE mg/dL
Leukocytes,Ua: NEGATIVE
Nitrite: NEGATIVE
Protein, ur: NEGATIVE mg/dL
Specific Gravity, Urine: 1.019 (ref 1.005–1.030)
pH: 6 (ref 5.0–8.0)

## 2022-04-05 LAB — RESP PANEL BY RT-PCR (FLU A&B, COVID) ARPGX2
Influenza A by PCR: NEGATIVE
Influenza B by PCR: NEGATIVE
SARS Coronavirus 2 by RT PCR: NEGATIVE

## 2022-04-05 LAB — VITAMIN B12: Vitamin B-12: 485 pg/mL (ref 211–911)

## 2022-04-05 MED ORDER — IBUPROFEN 200 MG PO TABS: 200.0000 mg | ORAL_TABLET | Freq: Three times a day (TID) | ORAL | Status: DC | PRN

## 2022-04-05 MED ORDER — VANCOMYCIN HCL IN DEXTROSE 1-5 GM/200ML-% IV SOLN
1000.0000 mg | Freq: Once | INTRAVENOUS | Status: DC
  Filled 2022-04-05: qty 200

## 2022-04-05 MED ORDER — CARBIDOPA-LEVODOPA 25-100 MG PO TABS: 0.5000 | ORAL_TABLET | Freq: Three times a day (TID) | ORAL | 2 refills | Status: DC

## 2022-04-05 MED ORDER — LACTATED RINGERS IV BOLUS
1000.0000 mL | Freq: Once | INTRAVENOUS | Status: AC
  Administered 2022-04-05: 1000 mL via INTRAVENOUS

## 2022-04-05 MED ORDER — ACETAMINOPHEN 325 MG PO TABS
650.0000 mg | ORAL_TABLET | Freq: Four times a day (QID) | ORAL | Status: DC | PRN
  Administered 2022-04-05 – 2022-04-14 (×5): 650 mg via ORAL
  Filled 2022-04-05 (×4): qty 2

## 2022-04-05 MED ORDER — ENOXAPARIN SODIUM 40 MG/0.4ML IJ SOSY
40.0000 mg | PREFILLED_SYRINGE | INTRAMUSCULAR | Status: DC
  Administered 2022-04-06 – 2022-04-14 (×9): 40 mg via SUBCUTANEOUS
  Filled 2022-04-05 (×9): qty 0.4

## 2022-04-05 MED ORDER — KETOROLAC TROMETHAMINE 15 MG/ML IJ SOLN
15.0000 mg | Freq: Four times a day (QID) | INTRAMUSCULAR | Status: AC | PRN
  Administered 2022-04-05 – 2022-04-10 (×7): 15 mg via INTRAVENOUS
  Filled 2022-04-05 (×7): qty 1

## 2022-04-05 MED ORDER — VANCOMYCIN HCL 1250 MG/250ML IV SOLN: 1250.0000 mg | INTRAVENOUS | Status: DC

## 2022-04-05 MED ORDER — VANCOMYCIN HCL IN DEXTROSE 1-5 GM/200ML-% IV SOLN: 1000.0000 mg | INTRAVENOUS | Status: DC

## 2022-04-05 MED ORDER — ACETAMINOPHEN 500 MG PO TABS
1000.0000 mg | ORAL_TABLET | ORAL | Status: AC
  Administered 2022-04-05: 1000 mg via ORAL
  Filled 2022-04-05: qty 2

## 2022-04-05 MED ORDER — TRAZODONE HCL 50 MG PO TABS
25.0000 mg | ORAL_TABLET | Freq: Every evening | ORAL | Status: DC | PRN
  Administered 2022-04-05 – 2022-04-13 (×4): 25 mg via ORAL
  Filled 2022-04-05 (×4): qty 1

## 2022-04-05 MED ORDER — CARBIDOPA-LEVODOPA 25-100 MG PO TABS
0.5000 | ORAL_TABLET | Freq: Three times a day (TID) | ORAL | Status: DC
  Administered 2022-04-05 – 2022-04-07 (×3): 0.5 via ORAL
  Filled 2022-04-05: qty 1
  Filled 2022-04-05 (×2): qty 0.5
  Filled 2022-04-05: qty 1
  Filled 2022-04-05 (×4): qty 0.5

## 2022-04-05 MED ORDER — VANCOMYCIN HCL 1750 MG/350ML IV SOLN
1750.0000 mg | Freq: Once | INTRAVENOUS | Status: AC
  Administered 2022-04-05: 1750 mg via INTRAVENOUS
  Filled 2022-04-05: qty 350

## 2022-04-05 MED ORDER — ACETAMINOPHEN 650 MG RE SUPP
650.0000 mg | Freq: Four times a day (QID) | RECTAL | Status: DC | PRN
  Administered 2022-04-06: 650 mg via RECTAL
  Filled 2022-04-05: qty 1

## 2022-04-05 MED ORDER — SODIUM CHLORIDE 0.9 % IV SOLN: 1.0000 g | INTRAVENOUS | Status: DC

## 2022-04-05 MED ORDER — SODIUM CHLORIDE 0.9 % IV SOLN: 2.0000 g | INTRAVENOUS | Status: DC

## 2022-04-05 MED ORDER — FAMOTIDINE 20 MG PO TABS
20.0000 mg | ORAL_TABLET | Freq: Two times a day (BID) | ORAL | Status: DC
  Administered 2022-04-05 – 2022-04-14 (×18): 20 mg via ORAL
  Filled 2022-04-05 (×18): qty 1

## 2022-04-05 MED ORDER — SODIUM CHLORIDE 0.45 % IV SOLN: INTRAVENOUS | Status: DC

## 2022-04-05 MED ORDER — SODIUM CHLORIDE 0.9 % IV SOLN: 1.0000 g | Freq: Once | INTRAVENOUS | Status: DC

## 2022-04-05 MED ORDER — LINACLOTIDE 145 MCG PO CAPS
290.0000 ug | ORAL_CAPSULE | Freq: Every day | ORAL | Status: DC | PRN
  Administered 2022-04-08: 290 ug via ORAL
  Filled 2022-04-05 (×2): qty 2

## 2022-04-05 NOTE — Subjective & Objective (Signed)
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 He fell 3-4 weeks ago sustaining deep abrasion to his left distal LE. He has had a full course of doxycycline and then a second course when the infection recurred. Today, after a neurologic consultation he got home and experienced rigors, profound weakness, developed a significant fever. Due to his inability to ambulate EMS was activated and he was brought to [facility] for evaluation.

## 2022-04-05 NOTE — ED Provider Notes (Signed)
[facility] EMERGENCY DEPARTMENT Provider Note   CSN: [id number] Arrival date & time: 04/05/22  1446     History  Chief Complaint  Patient presents with   Weakness    Melvin [name] is a 76 y.o. male.  76 year old male with a history of unsteady gait, osteoarthritis, scoliosis, and suspected Parkinson's who presents to the emergency department with generalized weakness.  Per patient and his wife he had a left lower extremity leg wound after scraping it on October 17.  Has intermittently had infections of his leg that have required 2 rounds of antibiotics.  Believe he is currently on doxycycline.  Was seen in neurology clinic today for difficulty with ambulation and when returning home his wife noticed that he was having rigors and felt very weak.  She helped him up onto bed but given his weakness and the fact that he appeared very fatigued called 911.  Patient denies any chest pain or shortness of breath.  No cough recently.  No runny nose or sore throat.  No dysuria or frequency.  They state that his left lower extremity is slightly more red than usual but he denies any pain.  His wife states he has had increased drainage from the wound recently.  They deny any focal weakness.       Home Medications Prior to Admission medications   Medication Sig Start Date End Date Taking? Authorizing Provider  b complex vitamins capsule Take 1 capsule by mouth daily.   Yes [provider]  cimetidine (TAGAMET) 200 MG tablet Take 200 mg by mouth 2 (two) times daily.   Yes [provider]  dorzolamidel-timolol (COSOPT PF) 22.3-6.8 MG/ML SOLN ophthalmic solution Place 1 drop into both eyes 2 (two) times daily.   Yes [provider]  doxycycline (VIBRA-TABS) 100 MG tablet Take 1 tablet (100 mg total) by mouth daily. 03/31/22  Yes Hulan Saas M, DO  EDEX 20 MCG injection 20 mcg by Intracavitary route as needed for erectile dysfunction. 09/16/21  Yes [provider]  fluocinonide cream (LIDEX) 0.05 % APPLY TO THE AFFECTED AREA ONCE A DAY. Patient taking differently: Apply 1 Application topically daily as needed (skin infection). 02/15/22  Yes Burns, Claudina Lick, MD  ibuprofen (ADVIL) 200 MG tablet Take 200 mg by mouth every 8 (eight) hours as needed (back pain.).   Yes [provider]  linaclotide (LINZESS) 290 MCG CAPS capsule Take 1 capsule (290 mcg total) by mouth daily as needed (constipation). 02/15/22  Yes Burns, Claudina Lick, MD  Multiple Vitamin (MULTIVITAMIN WITH MINERALS) TABS tablet Take 1 tablet by mouth daily with lunch.   Yes [provider]  Tafluprost, PF, (ZIOPTAN) 0.0015 % SOLN Place 1 drop into both eyes at bedtime.   Yes [provider]  carbidopa-levodopa (SINEMET IR) 25-100 MG tablet Take 0.5 tablets by mouth 3 (three) times daily. Patient not taking: Reported on 04/05/2022 04/05/22   Shellia Carwin, MD  chlorhexidine (PERIDEX) 0.12 % solution SMARTSIG:By Mouth Patient not taking: Reported on 04/05/2022 09/03/21   [provider]      Allergies    Sulfa antibiotics    Review of Systems   Review of Systems  Physical Exam Updated Vital Signs BP 97/70 (BP Location: Left Arm)   Pulse (!) 59   Temp 98.7 F (37.1 C) (Oral)   Resp 17   Ht '6\' 1"'$  (1.854 m)   Wt 78.8 kg   SpO2 98%   BMI 22.93 kg/m  Physical Exam Vitals and nursing note reviewed.  Constitutional:      General: He is not in acute distress.    Appearance: He is well-developed.  HENT:     Head: Normocephalic and atraumatic.     Right Ear: External ear normal.     Left Ear: External ear normal.     Nose: Nose normal.  Eyes:     Extraocular Movements: Extraocular movements intact.     Conjunctiva/sclera: Conjunctivae normal.     Pupils: Pupils are equal, round, and reactive to light.  Cardiovascular:     Rate and Rhythm: Normal rate and regular rhythm.     Heart sounds: Normal heart sounds.  Pulmonary:     Effort:  Pulmonary effort is normal. No respiratory distress.     Breath sounds: Normal breath sounds.  Abdominal:     General: There is no distension.     Palpations: Abdomen is soft. There is no mass.     Tenderness: There is no abdominal tenderness. There is no guarding.  Musculoskeletal:     Cervical back: Normal range of motion and neck supple.     Right lower leg: No edema.     Left lower leg: Edema present.     Comments: To left lower extremity wounds with surrounding erythema and warmth.  See images below.  No crepitance noted no pain out of proportion to exam.  No tenderness past the edges of the erythema.  Skin:    General: Skin is warm and dry.  Neurological:     General: No focal deficit present.     Mental Status: He is alert. Mental status is at baseline.     Cranial Nerves: No cranial nerve deficit.     Sensory: No sensory deficit.     Motor: No weakness.  Psychiatric:        Mood and Affect: Mood normal.        Behavior: Behavior normal.     ED Results / Procedures / Treatments   Labs (all labs ordered are listed, but only abnormal results are displayed) Labs Reviewed  CBC WITH DIFFERENTIAL/PLATELET - Abnormal; Notable for the following components:      Result Value   WBC 11.7 (*)    RBC 3.91 (*)    HCT 37.6 (*)    Neutro Abs 10.9 (*)    Lymphs Abs 0.3 (*)    Abs Immature Granulocytes 0.11 (*)    All other components within normal limits  COMPREHENSIVE METABOLIC PANEL - Abnormal; Notable for the following components:   Glucose, Bld 158 (*)    Calcium 8.4 (*)    Total Protein 5.0 (*)    Albumin 2.9 (*)    All other components within normal limits  RESP PANEL BY RT-PCR (FLU A&B, COVID) ARPGX2  CULTURE, BLOOD (SINGLE)  LACTIC ACID, PLASMA  LACTIC ACID, PLASMA  URINALYSIS, ROUTINE W REFLEX MICROSCOPIC    EKG EKG Interpretation  Date/Time:  Tuesday April 05 2022 15:09:04 EST Ventricular Rate:  96 PR Interval:  157 QRS Duration: 103 QT Interval:  338 QTC  Calculation: 428 R Axis:   -46 Text Interpretation: Sinus rhythm LAD, consider left anterior fascicular block Probable anteroseptal infarct, old Nonspecific T abnormalities, lateral leads Confirmed by Margaretmary Eddy [phone]) on 04/05/2022 5:00:31 PM  Radiology DG Chest 2 View  Result Date: 04/05/2022 CLINICAL DATA:  Possible infiltrate. EXAM: CHEST - 2 VIEW COMPARISON:  April 05, 2022. FINDINGS: The heart size and mediastinal contours are within  normal limits. Both lungs are clear. The visualized skeletal structures are unremarkable. IMPRESSION: No active cardiopulmonary disease. Electronically Signed   By: Marijo Conception M.D.   On: 04/05/2022 17:54   DG Chest Port 1 View  Result Date: 04/05/2022 CLINICAL DATA:  Questionable sepsis - evaluate for abnormality EXAM: PORTABLE CHEST 1 VIEW COMPARISON:  Radiographs 07/28/2021, PET CT 10/26/2021 FINDINGS: Slight volume loss at the left lung base with elevation of hemidiaphragm and ill-defined basilar opacity. The right lung is clear. The heart is stable in size with unchanged mediastinal contours. No pulmonary edema, pleural effusion or pneumothorax. Stable osseous structures. IMPRESSION: Slight volume loss at the left lung base with ill-defined basilar opacity, atelectasis versus pneumonia. Electronically Signed   By: Keith Rake M.D.   On: 04/05/2022 16:06    Procedures Procedures   Medications Ordered in ED Medications  famotidine (PEPCID) tablet 20 mg (20 mg Oral Given 04/05/22 2106)  linaclotide (LINZESS) capsule 290 mcg (has no administration in time range)  carbidopa-levodopa (SINEMET IR) 25-100 MG per tablet immediate release 0.5 tablet (has no administration in time range)  enoxaparin (LOVENOX) injection 40 mg (has no administration in time range)  0.45 % sodium chloride infusion (has no administration in time range)  acetaminophen (TYLENOL) tablet 650 mg (has no administration in time range)    Or  acetaminophen (TYLENOL)  suppository 650 mg (has no administration in time range)  ketorolac (TORADOL) 15 MG/ML injection 15 mg (has no administration in time range)  traZODone (DESYREL) tablet 25 mg (has no administration in time range)  cefTRIAXone (ROCEPHIN) 2 g in sodium chloride 0.9 % 100 mL IVPB (has no administration in time range)  vancomycin (VANCOREADY) IVPB 1750 mg/350 mL (has no administration in time range)  acetaminophen (TYLENOL) tablet 1,000 mg (1,000 mg Oral Given 04/05/22 1542)  lactated ringers bolus 1,000 mL (1,000 mLs Intravenous New Bag/Given 04/05/22 1756)    ED Course/ Medical Decision Making/ A&P Clinical Course as of 04/05/22 2118  Tue Apr 05, 2022  1958 Dr Linda Hedges hospitalist [RP]    Clinical Course User Index [RP] Fransico Meadow, MD                           Medical Decision Making Amount and/or Complexity of Data Reviewed Labs: ordered. Radiology: ordered. ECG/medicine tests: ordered.  Risk OTC drugs. Decision regarding hospitalization.   Ahmod Gillespie is a 76 y.o. male with comorbidities that complicate the patient evaluation including unsteady gait, osteoarthritis, scoliosis, and suspected Parkinson's who presents to the emergency department with generalized weakness and left lower extremity cellulitis and fever  Initial Ddx:  Cellulitis, neck fasc, viral illness, pneumonia, UTI  MDM:  Feel the patient is likely having generalized weakness due to infection from his left lower extremity.  Could also be due to developing virus, pneumonia, or UTI so will obtain imaging and lab work to assess for these.  If negative will treat with IV antibiotics for cellulitis.  Plan:  Labs Blood cultures COVID/flu Chest x-ray Urinalysis  ED Summary/Re-evaluation:  Patient underwent the above work-up which showed possible pneumonia on portable chest x-ray.  Asked patient again about respiratory symptoms which he denied to obtain 2 view chest x-ray which was negative for pneumonia.   Given his extensive cellulitis as well as the fact that he was unable to ambulate without assistance in the emergency department and feel that he would benefit from admission for IV antibiotics.  He was  given fluids as well and discussed with hospitalist for admission.  This patient presents to the ED for concern of complaints listed in HPI, this involves an extensive number of treatment options, and is a complaint that carries with it a high risk of complications and morbidity. Disposition including potential need for admission considered.   Dispo: DC Home. Return precautions discussed including, but not limited to, those listed in the AVS. Allowed pt time to ask questions which were answered fully prior to dc.  Additional history obtained from spouse Records reviewed Outpatient Clinic Notes The following labs were independently interpreted: Chemistry and show no acute abnormality I independently reviewed the following imaging with scope of interpretation limited to determining acute life threatening conditions related to emergency care: Chest x-ray, which revealed no acute abnormality  I personally reviewed and interpreted cardiac monitoring: normal sinus rhythm  I personally reviewed and interpreted the pt's EKG: see above for interpretation  I have reviewed the patients home medications and made adjustments as needed Consults: Hospitalist  Final Clinical Impression(s) / ED Diagnoses Final diagnoses:  Sepsis, due to unspecified organism, unspecified whether acute organ dysfunction present [facility])  Cellulitis of left lower extremity    Rx / DC Orders ED Discharge Orders     None         Fransico Meadow, MD 04/05/22 2118

## 2022-04-05 NOTE — Assessment & Plan Note (Signed)
On exam trace DP pulse left foot, slow capillary refill mid foot and toes. Concern that slow healing in part due to PAD.  Plan ABI exam

## 2022-04-05 NOTE — Assessment & Plan Note (Signed)
Patient with traumatic abrasion that became infected. He has failed outpatient oral abx with doxycycline. -Management as above

## 2022-04-05 NOTE — Patient Instructions (Signed)
I saw you today for imbalance and difficulty walking.  Based on your story and examination, I am most concerned about a disorder such as Parkinson's disease.  I would like to investigate for potential mimics: -Blood work today -MRI of your brain and cervical spine (you will get a call from the schedulers, if you do not hear from someone in 1-2 weeks, let us know)  I will be in touch when I have your results. Please let me know if you have any questions or concerns in the meantime.   We will start a trial of Sinemet for parkinson's disease. You will take 1/2 tablet 3 times per day (morning, midday, and evening). I sent the prescription to your pharmacy. Let me know if you have problems with the medication.  I would like to see you back in clinic in 1 month.  The physicians and staff at [facility] are committed to providing excellent care. You may receive a survey requesting feedback about your experience at our office. We strive to receive "very good" responses to the survey questions. If you feel that your experience would prevent you from giving the office a "very good " response, please contact our office to try to remedy the situation. We may be reached at [phone]. Thank you for taking the time out of your busy day to complete the survey.  Kai Levins, MD [facility]  Preventing Falls at [facility] are common, often dreaded events in the lives of older people. Aside from the obvious injuries and even death that may result, fall can cause wide-ranging consequences including loss of independence, mental decline, decreased activity and mobility. Younger people are also at risk of falling, especially those with chronic illnesses and fatigue.  Ways to reduce risk for falling Examine diet and medications. Warm foods and alcohol dilate blood vessels, which can lead to dizziness when standing. Sleep aids, antidepressants and pain medications can also increase the likelihood of a  fall.  Get a vision exam. Poor vision, cataracts and glaucoma increase the chances of falling.  Check foot gear. Shoes should fit snugly and have a sturdy, nonskid sole and a broad, low heel  Participate in a physician-approved exercise program to build and maintain muscle strength and improve balance and coordination. Programs that use ankle weights or stretch bands are excellent for muscle-strengthening. Water aerobics programs and low-impact Tai Chi programs have also been shown to improve balance and coordination.  Increase vitamin D intake. Vitamin D improves muscle strength and increases the amount of calcium the body is able to absorb and deposit in bones.  How to prevent falls from common hazards Floors - Remove all loose wires, cords, and throw rugs. Minimize clutter. Make sure rugs are anchored and smooth. Keep furniture in its usual place.  Chairs -- Use chairs with straight backs, armrests and firm seats. Add firm cushions to existing pieces to add height.  Bathroom - Install grab bars and non-skid tape in the tub or shower. Use a bathtub transfer bench or a shower chair with a back support Use an elevated toilet seat and/or safety rails to assist standing from a low surface. Do not use towel racks or bathroom tissue holders to help you stand.  Lighting - Make sure halls, stairways, and entrances are well-lit. Install a night light in your bathroom or hallway. Make sure there is a light switch at the top and bottom of the staircase. Turn lights on if you get up in the middle  of the night. Make sure lamps or light switches are within reach of the bed if you have to get up during the night.  Kitchen - Install non-skid rubber mats near the sink and stove. Clean spills immediately. Store frequently used utensils, pots, pans between waist and eye level. This helps prevent reaching and bending. Sit when getting things out of lower cupboards.  Living room/ Bedrooms - Place furniture with  wide spaces in between, giving enough room to move around. Establish a route through the living room that gives you something to hold onto as you walk.  Stairs - Make sure treads, rails, and rugs are secure. Install a rail on both sides of the stairs. If stairs are a threat, it might be helpful to arrange most of your activities on the lower level to reduce the number of times you must climb the stairs.  Entrances and doorways - Install metal handles on the walls adjacent to the doorknobs of all doors to make it more secure as you travel through the doorway.  Tips for maintaining balance Keep at least one hand free at all times. Try using a backpack or fanny pack to hold things rather than carrying them in your hands. Never carry objects in both hands when walking as this interferes with keeping your balance.  Attempt to swing both arms from front to back while walking. This might require a conscious effort if Parkinson's disease has diminished your movement. It will, however, help you to maintain balance and posture, and reduce fatigue.  Consciously lift your feet off of the ground when walking. Shuffling and dragging of the feet is a common culprit in losing your balance.  When trying to navigate turns, use a "U" technique of facing forward and making a wide turn, rather than pivoting sharply.  Try to stand with your feet shoulder-length apart. When your feet are close together for any length of time, you increase your risk of losing your balance and falling.  Do one thing at a time. Don't try to walk and accomplish another task, such as reading or looking around. The decrease in your automatic reflexes complicates motor function, so the less distraction, the better.  Do not wear rubber or gripping soled shoes, they might "catch" on the floor and cause tripping.  Move slowly when changing positions. Use deliberate, concentrated movements and, if needed, use a grab bar or walking aid. Count 15  seconds between each movement. For example, when rising from a seated position, wait 15 seconds after standing to begin walking.  If balance is a continuous problem, you might want to consider a walking aid such as a cane, walking stick, or walker. Once you've mastered walking with help, you might be ready to try it on your own again.

## 2022-04-05 NOTE — Progress Notes (Signed)
Pharmacy Antibiotic Note  Melvin [name] is a 76 y.o. male admitted on 04/05/2022 with Cellulitis.  Pharmacy has been consulted for vancomycin dosing.  CrCl 60-70 mL/min which appears close to baseline.  Plan: Vancomycin '1750mg'$  IV once then '1250mg'$  Q24H (eAUC 416, Scr 1.2, Vd 0.72) Ceftriaxone 2g Q24H  F/u cultures for de-escalation Monitor renal function for dose adjustment  Height: '6\' 1"'$  (185.4 cm) Weight: 78.8 kg (173 lb 12.6 oz) IBW/kg (Calculated) : 79.9  Temp (24hrs), Avg:101 F (38.3 C), Min:100.2 F (37.9 C), Max:101.8 F (38.8 C)  Recent Labs  Lab 04/05/22 1545 04/05/22 1718  WBC 11.7*  --   LATICACIDVEN 1.4 1.0    CrCl cannot be calculated (Patient's most recent lab result is older than the maximum 21 days allowed.).    Allergies  Allergen Reactions   Sulfa Antibiotics Hives    Antimicrobials this admission: Vancomycin 12/5 >> Ceftriaxone 12/5  Microbiology results: 12/5 Bcx: sent  Thank you for allowing pharmacy to be a part of this patient's care.  Merrilee Jansky, PharmD Clinical Pharmacist 04/05/2022 8:13 PM

## 2022-04-05 NOTE — Assessment & Plan Note (Signed)
Patient with very recent neuro eval and presumptive diagnosis of Parkinson's disease. MRI and labs ordered by neuro are pending.   Plan Sinemet as ordered by neuro.

## 2022-04-05 NOTE — ED Triage Notes (Signed)
Pt BIB [facility] for c/o weakness sudden onset following neurology appointment today.

## 2022-04-05 NOTE — ED Notes (Signed)
CMP recollected and sent.

## 2022-04-05 NOTE — H&P (Signed)
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 He fell 3-4 weeks ago sustaining deep abrasion to his left distal LE. He has had a full course of doxycycline and then a second course when the infection recurred. Today, after a neurologic consultation he got home and experienced rigors, profound weakness, developed a significant fever. Due to his inability to ambulate EMS was activated and he was brought to [facility] for evaluation.    ED Course: Tmax 100.2  110/53  HR 88 RR 17. Per EDP left LE with wound mid-shin with calore, dolore and rubor. Lab reveals leukocytosis with left shift, Lactic acid level 1.0. Patient treated with 1L IVF. Pharmacy consult for Vanc, rocephin ordered. TRH called to admit to continue evaluation and treatment with IV abx.   Review of Systems:  Review of Systems  Constitutional:  Positive for chills, fever and malaise/fatigue. Negative for weight loss.  HENT:  Positive for hearing loss.   Eyes: Negative.   Respiratory: Negative.    Cardiovascular: Negative.   Gastrointestinal:  Positive for heartburn. Negative for diarrhea, nausea and vomiting.  Genitourinary: Negative.   Musculoskeletal:  Positive for back pain and neck pain.  Skin:        Dry skin lateral left foot. Non-healing wound mid-shin left.  Neurological:  Positive for weakness.  Endo/Heme/Allergies: Negative.   Psychiatric/Behavioral: Negative.      Past Medical History:  Diagnosis Date   Arthritis    Cancer [facility])     Past Surgical History:  Procedure Laterality Date   INGUINAL HERNIA REPAIR Right 04/01/2021    Procedure: LAPAROSCOPIC INGUINAL HERNIA;  Surgeon: Ralene Ok, MD;  Location: [place];  Service: General;  Laterality: Right;   INGUINAL HERNIA REPAIR Left 07/09/2021   Procedure: OPEN LEFT INGUINAL HERNIA REPAIR WITH MESH;  Surgeon: Ralene Ok, MD;  Location: [place];  Service: General;  Laterality: Left;   INSERTION OF MESH Right 04/01/2021   Procedure: INSERTION OF MESH;  Surgeon: Ralene Ok, MD;  Location: [place];  Service: General;  Laterality: Right;   INSERTION OF MESH Left 07/09/2021   Procedure: INSERTION OF MESH;  Surgeon: Ralene Ok, MD;  Location: [place];  Service: General;  Laterality: Left;   LYMPHADENECTOMY Bilateral 09/12/2012   Procedure: LYMPHADENECTOMY;  Surgeon: Alexis Frock, MD;  Location: [facility];  Service: Urology;  Laterality: Bilateral;   ROBOT ASSISTED LAPAROSCOPIC RADICAL PROSTATECTOMY N/A 09/12/2012   Procedure: ROBOTIC ASSISTED LAPAROSCOPIC RADICAL PROSTATECTOMY;  Surgeon: Alexis Frock, MD;  Location: [facility];  Service: Urology;  Laterality: N/A;   TONSILLECTOMY      Soc Hx- married twice - one daughter from 20 st marriage; 2nd marriage 8 years. 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 Lives with spouse.    reports that he quit smoking about 29 years ago. His smoking use included cigarettes. He has never used smokeless tobacco. He reports that he does not currently use alcohol. He reports that he does not use drugs.  Allergies  Allergen Reactions   Sulfa Antibiotics Hives    No family history on file.  Prior to Admission medications   Medication Sig Start Date End Date Taking? Authorizing Provider  b complex vitamins capsule Take 1 capsule by mouth daily.   Yes [provider]  cimetidine (TAGAMET) 200 MG tablet Take 200 mg by mouth 2 (two) times daily.   Yes [provider]  dorzolamidel-timolol (COSOPT PF) 22.3-6.8 MG/ML SOLN ophthalmic solution Place 1 drop into both  eyes 2 (two) times daily.   Yes [provider]  doxycycline (VIBRA-TABS) 100 MG tablet Take 1 tablet (100 mg total) by mouth daily. 03/31/22  Yes Hulan Saas M, DO  EDEX 20 MCG injection 20 mcg by Intracavitary route as needed for erectile dysfunction. 09/16/21  Yes [provider]  fluocinonide cream (LIDEX) 0.05 % APPLY TO THE AFFECTED AREA ONCE A DAY. Patient taking differently: Apply 1 Application topically daily as needed (skin infection). 02/15/22  Yes Burns, Claudina Lick, MD  ibuprofen (ADVIL) 200 MG tablet Take 200 mg by mouth every 8 (eight) hours as needed (back pain.).   Yes [provider]  linaclotide (LINZESS) 290 MCG CAPS capsule Take 1 capsule (290 mcg total) by mouth daily as needed (constipation). 02/15/22  Yes Burns, Claudina Lick, MD  Multiple Vitamin (MULTIVITAMIN WITH MINERALS) TABS tablet Take 1 tablet by mouth daily with lunch.   Yes [provider]  Tafluprost, PF, (ZIOPTAN) 0.0015 % SOLN Place 1 drop into both eyes at bedtime.   Yes [provider]  carbidopa-levodopa (SINEMET IR) 25-100 MG tablet Take 0.5 tablets by mouth 3 (three) times daily. Patient not taking: Reported on 04/05/2022 04/05/22   Shellia Carwin, MD  chlorhexidine (PERIDEX) 0.12 % solution SMARTSIG:By Mouth Patient not taking: Reported on 04/05/2022 09/03/21   [provider]    Physical Exam: Vitals:   04/05/22 1747 04/05/22 1800 04/05/22 1815 04/05/22 2105  BP: (!) 113/59 113/63 (!) 110/53 97/70  Pulse: 88   (!) 59  Resp:  18 17   Temp:    98.7 F (37.1 C)  TempSrc:    Oral  SpO2: 96%   98%  Weight:      Height:        Physical Exam Constitutional:      General: He is not in acute distress.    Appearance: Normal appearance. He is normal weight.  HENT:     Head: Normocephalic and atraumatic.     Mouth/Throat:     Mouth: Mucous membranes are moist.  Eyes:     Extraocular Movements: Extraocular movements intact.     Conjunctiva/sclera:  Conjunctivae normal.     Pupils: Pupils are equal, round, and reactive to light.     Comments: Mild swelling eyelids  Neck:     Comments: Chronic neck pain and stiffness.  Cardiovascular:     Rate and Rhythm: Normal rate and regular rhythm.     Heart sounds: Normal heart sounds. No murmur heard.    No friction rub.     Comments: 2+ radial pulses, 1+ DP right, trace DP left, slow capillary refill left distal foot and toes.  Pulmonary:     Effort: Pulmonary effort is normal.     Breath sounds: Normal breath sounds.  Abdominal:     General: Bowel sounds are normal.     Palpations: Abdomen is soft.  Musculoskeletal:        General: Deformity present. No swelling.     Cervical back: Rigidity and tenderness present.     Right lower leg: Edema present.  Left lower leg: Edema present.     Comments: Hammer toe deformities all toes.  1+ pitting pedal edema bilaterally, 1+ pitting edema distal left LE  Skin:    General: Skin is warm.     Capillary Refill: Capillary refill takes more than 3 seconds.     Findings: Erythema and lesion present.     Comments: Left knee with erythematous old abrasion; Distal mid-shin left - warm to touch with central wound over shin 2 x cm with pale eschar over center of wound. Mild swelling around the wound At the ankle left small mostly healed abrasion.   Neurological:     Mental Status: He is alert and oriented to person, place, and time. Mental status is at baseline.     Cranial Nerves: No cranial nerve deficit.     Motor: Weakness present.  Psychiatric:        Mood and Affect: Mood normal.      Labs on Admission: I have personally reviewed following labs and imaging studies  CBC: Recent Labs  Lab 04/05/22 1545  WBC 11.7*  NEUTROABS 10.9*  HGB 13.3  HCT 37.6*  MCV 96.2  PLT 185   Basic Metabolic Panel: Recent Labs  Lab 04/05/22 2012  NA 137  K 3.9  CL 107  CO2 22  GLUCOSE 158*  BUN 23  CREATININE 1.12  CALCIUM 8.4*    GFR: Estimated Creatinine Clearance: 61.6 mL/min (by C-G formula based on SCr of 1.12 mg/dL). Liver Function Tests: Recent Labs  Lab 04/05/22 2012  AST 33  ALT 21  ALKPHOS 45  BILITOT 1.2  PROT 5.0*  ALBUMIN 2.9*   No results for input(s): "LIPASE", "AMYLASE" in the last 168 hours. No results for input(s): "AMMONIA" in the last 168 hours. Coagulation Profile: No results for input(s): "INR", "PROTIME" in the last 168 hours. Cardiac Enzymes: No results for input(s): "CKTOTAL", "CKMB", "CKMBINDEX", "TROPONINI" in the last 168 hours. BNP (last 3 results) No results for input(s): "PROBNP" in the last 8760 hours. HbA1C: No results for input(s): "HGBA1C" in the last 72 hours. CBG: No results for input(s): "GLUCAP" in the last 168 hours. Lipid Profile: No results for input(s): "CHOL", "HDL", "LDLCALC", "TRIG", "CHOLHDL", "LDLDIRECT" in the last 72 hours. Thyroid Function Tests: No results for input(s): "TSH", "T4TOTAL", "FREET4", "T3FREE", "THYROIDAB" in the last 72 hours. Anemia Panel: No results for input(s): "VITAMINB12", "FOLATE", "FERRITIN", "TIBC", "IRON", "RETICCTPCT" in the last 72 hours. Urine analysis:    Component Value Date/Time   COLORURINE YELLOW 04/05/2022 1522   APPEARANCEUR CLEAR 04/05/2022 1522   LABSPEC 1.019 04/05/2022 1522   PHURINE 6.0 04/05/2022 1522   GLUCOSEU NEGATIVE 04/05/2022 1522   HGBUR NEGATIVE 04/05/2022 1522   BILIRUBINUR NEGATIVE 04/05/2022 1522   KETONESUR NEGATIVE 04/05/2022 1522   PROTEINUR NEGATIVE 04/05/2022 1522   NITRITE NEGATIVE 04/05/2022 1522   LEUKOCYTESUR NEGATIVE 04/05/2022 1522    Radiological Exams on Admission: I have personally reviewed images DG Chest 2 View  Result Date: 04/05/2022 CLINICAL DATA:  Possible infiltrate. EXAM: CHEST - 2 VIEW COMPARISON:  April 05, 2022. FINDINGS: The heart size and mediastinal contours are within normal limits. Both lungs are clear. The visualized skeletal structures are unremarkable.  IMPRESSION: No active cardiopulmonary disease. Electronically Signed   By: Marijo Conception M.D.   On: 04/05/2022 17:54   DG Chest Port 1 View  Result Date: 04/05/2022 CLINICAL DATA:  Questionable sepsis - evaluate for abnormality EXAM: PORTABLE CHEST 1 VIEW COMPARISON:  Radiographs 07/28/2021,  PET CT 10/26/2021 FINDINGS: Slight volume loss at the left lung base with elevation of hemidiaphragm and ill-defined basilar opacity. The right lung is clear. The heart is stable in size with unchanged mediastinal contours. No pulmonary edema, pleural effusion or pneumothorax. Stable osseous structures. IMPRESSION: Slight volume loss at the left lung base with ill-defined basilar opacity, atelectasis versus pneumonia. Electronically Signed   By: Keith Rake M.D.   On: 04/05/2022 16:06    EKG: I have personally reviewed EKG: sinus rhythm, LAFB, ?old anteroseptal injury  Assessment/Plan Active Problems:   Cellulitis of lower leg   PAD (peripheral artery disease) (HCC)   GERD (gastroesophageal reflux disease)   Unsteady gait    Assessment and Plan: Cellulitis of lower leg Patient with traumatic abrasion that became infected. He has failed outpatient oral abx with doxycycline. He now presents with pre-sepsis: fever, rigors, weakness, confusion. Lactic acid levels were normal. He was given 1 L IVF. He had a leukocytosis with left shift. Vanc and Rocephin ordered.   Plan Continue IV abx: VAnc until MRSA ruled out, and Rocephin.  Wound culture   F/u CBC 04/07/22 - if responding well covert to oral abx.   Wet to dry dressing to wound BID  PAD (peripheral artery disease) ([place]) On exam trace DP pulse left foot, slow capillary refill mid foot and toes. Concern that slow healing in part due to PAD.  Plan ABI exam  GERD (gastroesophageal reflux disease) Will continue H2 blocker therapy  Unsteady gait Patient with very recent neuro eval and presumptive diagnosis of Parkinson's disease. MRI and labs  ordered by neuro are pending.   Plan Sinemet as ordered by neuro.        DVT prophylaxis: Lovenox Code Status: Full Code Family Communication: spouse present during interview and exam. Answered all questions  Disposition Plan: home when stable  Consults called: none  Admission status: Inpatient, Med-Surg   Adella Hare, MD [facility] 04/05/2022, 9:38 PM

## 2022-04-05 NOTE — Assessment & Plan Note (Signed)
Will continue H2 blocker therapy

## 2022-04-05 NOTE — ED Notes (Signed)
Pt repositioned in bed by 2 Rns.

## 2022-04-06 ENCOUNTER — Encounter (HOSPITAL_COMMUNITY): Payer: Self-pay | Admitting: Internal Medicine

## 2022-04-06 ENCOUNTER — Inpatient Hospital Stay (HOSPITAL_COMMUNITY): Payer: BC Managed Care – PPO

## 2022-04-06 DIAGNOSIS — R7881 Bacteremia: Secondary | ICD-10-CM | POA: Diagnosis not present

## 2022-04-06 DIAGNOSIS — A409 Streptococcal sepsis, unspecified: Secondary | ICD-10-CM | POA: Diagnosis not present

## 2022-04-06 LAB — BLOOD CULTURE ID PANEL (REFLEXED) - BCID2

## 2022-04-06 LAB — ECHOCARDIOGRAM COMPLETE
Area-P 1/2: 3.17 cm2
Calc EF: 59.4 %
Height: 73 in
S' Lateral: 3.1 cm
Single Plane A2C EF: 58.4 %
Single Plane A4C EF: 60.9 %
Weight: 2780.62 oz

## 2022-04-06 LAB — GLUCOSE, CAPILLARY: Glucose-Capillary: 121 mg/dL — ABNORMAL HIGH (ref 70–99)

## 2022-04-06 MED ORDER — PENICILLIN G POTASSIUM 20000000 UNITS IJ SOLR
12.0000 10*6.[IU] | Freq: Two times a day (BID) | INTRAVENOUS | Status: DC
  Administered 2022-04-06 – 2022-04-14 (×16): 12 10*6.[IU] via INTRAVENOUS
  Filled 2022-04-06 (×18): qty 12
  Filled 2022-04-06: qty 5

## 2022-04-06 MED ORDER — PENICILLIN G POTASSIUM 20000000 UNITS IJ SOLR
4.0000 10*6.[IU] | INTRAVENOUS | Status: DC
  Administered 2022-04-06: 4 10*6.[IU] via INTRAVENOUS
  Filled 2022-04-06 (×10): qty 4

## 2022-04-06 NOTE — ED Notes (Signed)
Secretary placed order for hospital bed

## 2022-04-06 NOTE — Progress Notes (Signed)
OT Cancellation Note  Patient Details Name: Melvin [name] MRN: [id number] DOB: [birth year]   Cancelled Treatment:    Reason Eval/Treat Not Completed: Other (comment).  Patient with RN, will continue efforts as appropriate.    Jadie Allington D Hadiya Spoerl 04/06/2022, 4:54 PM 04/06/2022  RP, OTR/L  Acute Rehabilitation Services  Office:  [phone]

## 2022-04-06 NOTE — Progress Notes (Signed)
Echocardiogram 2D Echocardiogram has been performed.  Melvin [name] 04/06/2022, 3:43 PM

## 2022-04-06 NOTE — Hospital Course (Addendum)
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        12/5: Admitted on antibiotics   12/6: Blood cultures growing GBS; ID consulted; TTE negative

## 2022-04-06 NOTE — Progress Notes (Signed)
Progress Note   Patient: Melvin [name] MRN:[id number] DOB: 09/21/1945 DOA: 04/05/2022     1 DOS: the patient was seen and examined on 04/06/2022   Brief hospital course: Taken from H&P.  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 He fell 3-4 weeks ago sustaining deep abrasion to his left distal LE. He has had a full course of doxycycline and then a second course when the infection recurred. Today, after a neurologic consultation he got home and experienced rigors, profound weakness, developed a significant fever. Due to his inability to ambulate EMS was activated and he was brought to [facility] for evaluation.     ED course.  He was febrile at 100.2, rest of the vitals stable, left lower extremity wound looks infected.  Labs pertinent for leukocytosis with left shift. Chest x-ray with no acute abnormality.  UA negative.  COVID-19 and flu PCR negative. Preliminary blood cultures with Streptococcus agalactiae-pending final results. Antibiotics switched to penicillin G..  ID was consulted Echocardiogram ordered. Wound cultures are pending.    Assessment and Plan: * Streptococcal sepsis, unspecified (HCC) Patient met sepsis criteria with fever, leukocytosis and tachypnea.  Initial blood pressure was soft which responded well to IV fluid. Blood cultures with strep agalactiae-pending susceptibility and final results. Wound cultures are pending. Initially received broad-spectrum antibiotics. -Antibiotics switched to penicillin G -ID consult -Echocardiogram  Cellulitis of left lower leg Patient with traumatic abrasion that became infected. He has failed outpatient oral abx with doxycycline. -Management as above  PAD (peripheral artery disease) (HCC) On exam trace DP pulse left foot, slow capillary refill mid foot and toes. Concern that slow healing in part due to PAD.  Plan ABI  exam  GERD (gastroesophageal reflux disease) Will continue H2 blocker therapy  Unsteady gait Patient with very recent neuro eval and presumptive diagnosis of Parkinson's disease. MRI and labs ordered by neuro are pending.   Plan Sinemet as ordered by neuro.    Subjective: Patient was seen and examined today.  Complaining of left leg pain.  Physical Exam: Vitals:   04/06/22 0930 04/06/22 0947 04/06/22 1045 04/06/22 1222  BP: (!) 115/57   130/61  Pulse: 60 61  64  Resp: 16 16  18  Temp:  98.4 F (36.9 C) 99.8 F (37.7 C) 98 F (36.7 C)  TempSrc:  Oral Oral Oral  SpO2: 95% 95%  98%  Weight:      Height:       General.  Frail elderly man, in no acute distress. Pulmonary.  Lungs clear bilaterally, normal respiratory effort. CV.  Regular rate and rhythm, no JVD, rub or murmur. Abdomen.  Soft, nontender, nondistended, BS positive. CNS.  Alert and oriented .  No focal neurologic deficit. Extremities.  Left lower leg edema with surrounding erythema and a bandage placed on mid shin. Psychiatry.  Judgment and insight appears normal.   Data Reviewed: Prior data reviewed  Family Communication: Discussed with wife on phone.  Disposition: Status is: Inpatient Remains inpatient appropriate because: Severity of illness  Planned Discharge Destination:  To be determined  DVT prophylaxis.  Lovenox Time spent: 50 minutes  This record has been created using Dragon voice recognition software. Errors have been sought and corrected,but may not always be located. Such creation errors do not reflect on the standard of care.   Author:  , MD 04/06/2022 2:03 PM  For on call review   [email or web address].  

## 2022-04-06 NOTE — Progress Notes (Incomplete)
Pt refused but was confused, will attempt later. 1:32

## 2022-04-06 NOTE — Consult Note (Signed)
[facility] for Infectious Disease    Date of Admission:  04/05/2022     Total days of antibiotics 1   Ceftriaxone                 Reason for Consult: group b strep bacteremia     Referring Provider: Reesa Chew  Primary Care Provider: [name], MD   Assessment: Melvin [name] is a 76 y.o. 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 Has had several episodes of doxyycline rx'd outpatient for this wound (10/25, 11/02, 11/30). Initially had improvement but most recently no improvement and acutely worsened after re-prescribing doxy. On exam has non purulent open area on the left anterior shin with surrounding erythema extending down to ankle with mild swelling noted.   Agree to narrow to penicillin for treatment. Follow up echo. Still with high fever today - follow curve. May repeat blood cultures tomorrow if fevers don't resolve. Back pain with h/o scoliosis. No worse acutely. Follow CRP / ESR for consideration of possible MRI to rule out discitis.   Confused - could be d/t bacteremia though recently diagnosed with parkinson's and started on TID cinemat - concerned that this could be contributing to confusion. ?if we can delay continuing this for a period of time so not to cloud progress of infection treatment.    Plan: Continue IV penicillin - will ask about continuous PCN to limit infusion interruptions and help with sleep/wake a bit May consider to repeat blood cultures on 12/07 if still febrile. Hold off for now.  Watch back pain & Check ESR/CRP  Follow up echo  Consider holding parkinsons meds for now to not cloud encephalopathy picture    Principal Problem:   Streptococcal sepsis, unspecified ([place]) Active Problems:   GERD (gastroesophageal reflux disease)   Unsteady gait   Cellulitis of left lower leg   PAD (peripheral artery disease) (HCC)    carbidopa-levodopa   0.5 tablet Oral TID   enoxaparin (LOVENOX) injection  40 mg Subcutaneous Q24H   famotidine  20 mg Oral BID    HPI: Melvin [name] is a 76 y.o. male admitted from home for evaluation of fever/weakness.   PMHx low back pain/scoliosis, DJD with lumbar spine stenosis, h/o prostate cancer, glaucoma and poor balance. He fell 3-4 weeks ago with an abrasion to his left distal LE for which he was treated with doxycycline but had a recurrence of infection per chart review.   Developed rigors and weakness abruptly with fever and presented to ER via EMS. Started on IV vancomycin and ceftriaxone. Blood cultures growing out group b streptococcus.  Cellulitis of the left lower leg in setting of infected traumatic wound failing outpatient tx with doxycycline (several courses since October 2023). Recently diagnosed with parkinson's at neurologist office day of hospital admission. His wife provides much of the history as Melvin [name] is currently getting an echo and remains a bit confused.   Antibiotics have been narrowed to IV penicillin    Review of Systems: ROS - patient unable to meaningfully contribute much. Wife denies headaches or acute worsening of chronic back pain.    Past Medical History:  Diagnosis Date   Arthritis    Cancer [facility])     Social History   Tobacco Use   Smoking status: Former    Types: Cigarettes    Quit date: 09/12/1992  Years since quitting: 29.5   Smokeless tobacco: Never  Vaping Use   Vaping Use: Never used  Substance Use Topics   Alcohol use: Not Currently   Drug use: Never    No family history on file. Allergies  Allergen Reactions   Sulfa Antibiotics Hives    OBJECTIVE: Blood pressure 130/61, pulse 64, temperature 98 F (36.7 C), temperature source Oral, resp. rate 18, height _0  (1.854 m), weight 78.8 kg, SpO2 98 %.  Physical Exam Vitals reviewed.  Constitutional:      Appearance: He is ill-appearing. He is not toxic-appearing.  HENT:     Mouth/Throat:      Mouth: Mucous membranes are dry.  Eyes:     Conjunctiva/sclera: Conjunctivae normal.     Pupils: Pupils are equal, round, and reactive to light.  Cardiovascular:     Rate and Rhythm: Normal rate and regular rhythm.     Pulses: Normal pulses.  Pulmonary:     Effort: Pulmonary effort is normal. No respiratory distress.     Breath sounds: Normal breath sounds.  Abdominal:     General: Bowel sounds are normal. There is no distension.     Palpations: Abdomen is soft.  Musculoskeletal:        General: Swelling (LLE with redness and swelling. open wound with serous drainage to anterior tib.) present. Normal range of motion.     Cervical back: Normal range of motion. No rigidity.  Skin:    Findings: Erythema present.  Neurological:     Mental Status: He is disoriented.     Lab Results Lab Results  Component Value Date   WBC 11.7 (H) 04/05/2022   HGB 13.3 04/05/2022   HCT 37.6 (L) 04/05/2022   MCV 96.2 04/05/2022   PLT 164 04/05/2022    Lab Results  Component Value Date   CREATININE 1.12 04/05/2022   BUN 23 04/05/2022   NA 137 04/05/2022   K 3.9 04/05/2022   CL 107 04/05/2022   CO2 22 04/05/2022    Lab Results  Component Value Date   ALT 21 04/05/2022   AST 33 04/05/2022   ALKPHOS 45 04/05/2022   BILITOT 1.2 04/05/2022     Microbiology: Recent Results (from the past 240 hour(s))  Resp Panel by RT-PCR (Flu A&B, Covid)     Status: None   Collection Time: 04/05/22  3:23 PM   Specimen: Nasal Swab  Result Value Ref Range Status   SARS Coronavirus 2 by RT PCR NEGATIVE NEGATIVE Final    Comment: (NOTE) SARS-CoV-2 target nucleic acids are NOT DETECTED.  The SARS-CoV-2 RNA is generally detectable in upper respiratory specimens during the acute phase of infection. The lowest concentration of SARS-CoV-2 viral copies this assay can detect is 138 copies/mL. A negative result does not preclude SARS-Cov-2 infection and should not be used as the sole basis for treatment  or other patient management decisions. A negative result may occur with  improper specimen collection/handling, submission of specimen other than nasopharyngeal swab, presence of viral mutation(s) within the areas targeted by this assay, and inadequate number of viral copies(<138 copies/mL). A negative result must be combined with clinical observations, patient history, and epidemiological information. The expected result is Negative.  Fact Sheet for Patients:  EntrepreneurPulse.com.au  Fact Sheet for Healthcare Providers:  IncredibleEmployment.be  This test is no t yet approved or cleared by the [place] FDA and  has been authorized for detection and/or diagnosis of SARS-CoV-2 by FDA under an Emergency Use Authorization (  EUA). This EUA will remain  in effect (meaning this test can be used) for the duration of the COVID-19 declaration under Section 564(b)(1) of the Act, 21 U.S.C.section 360bbb-3(b)(1), unless the authorization is terminated  or revoked sooner.       Influenza A by PCR NEGATIVE NEGATIVE Final   Influenza B by PCR NEGATIVE NEGATIVE Final    Comment: (NOTE) The Xpert Xpress SARS-CoV-2/FLU/RSV plus assay is intended as an aid in the diagnosis of influenza from Nasopharyngeal swab specimens and should not be used as a sole basis for treatment. Nasal washings and aspirates are unacceptable for Xpert Xpress SARS-CoV-2/FLU/RSV testing.  Fact Sheet for Patients: EntrepreneurPulse.com.au  Fact Sheet for Healthcare Providers: IncredibleEmployment.be  This test is not yet approved or cleared by the [place] FDA and has been authorized for detection and/or diagnosis of SARS-CoV-2 by FDA under an Emergency Use Authorization (EUA). This EUA will remain in effect (meaning this test can be used) for the duration of the COVID-19 declaration under Section 564(b)(1) of the Act, 21 U.S.C. section  360bbb-3(b)(1), unless the authorization is terminated or revoked.  Performed at [facility], [place] [address], [place], [place] [address]   Blood Culture (routine single)     Status: None (Preliminary result)   Collection Time: 04/05/22  3:45 PM   Specimen: BLOOD  Result Value Ref Range Status   Specimen Description BLOOD SITE NOT SPECIFIED  Final   Special Requests   Final    BOTTLES DRAWN AEROBIC AND ANAEROBIC Blood Culture adequate volume   Culture  Setup Time   Final    GRAM POSITIVE COCCI IN PAIRS IN CHAINS IN BOTH AEROBIC AND ANAEROBIC BOTTLES CRITICAL RESULT CALLED TO, READ BACK BY AND VERIFIED WITH: Barbaraann Boys RN 04/06/22 @ 0643 BY AB Performed at [facility], [place] [address], [place], [place] [address]    Culture GRAM POSITIVE COCCI  Final   Report Status PENDING  Incomplete  Blood Culture ID Panel (Reflexed)     Status: Abnormal   Collection Time: 04/05/22  3:45 PM  Result Value Ref Range Status   Enterococcus faecalis NOT DETECTED NOT DETECTED Final   Enterococcus Faecium NOT DETECTED NOT DETECTED Final   Listeria monocytogenes NOT DETECTED NOT DETECTED Final   Staphylococcus species NOT DETECTED NOT DETECTED Final   Staphylococcus aureus (BCID) NOT DETECTED NOT DETECTED Final   Staphylococcus epidermidis NOT DETECTED NOT DETECTED Final   Staphylococcus lugdunensis NOT DETECTED NOT DETECTED Final   Streptococcus species DETECTED (A) NOT DETECTED Final    Comment: CRITICAL RESULT CALLED TO, READ BACK BY AND VERIFIED WITH: Barbaraann Boys RN 04/06/22 @ [phone] BY AB    Streptococcus agalactiae DETECTED (A) NOT DETECTED Final    Comment: CRITICAL RESULT CALLED TO, READ BACK BY AND VERIFIED WITH: Barbaraann Boys RN 04/06/22 @ 0643 BY AB    Streptococcus pneumoniae NOT DETECTED NOT DETECTED Final   Streptococcus pyogenes NOT DETECTED NOT DETECTED Final   A.calcoaceticus-baumannii NOT DETECTED NOT DETECTED Final   Bacteroides fragilis NOT DETECTED NOT DETECTED  Final   Enterobacterales NOT DETECTED NOT DETECTED Final   Enterobacter cloacae complex NOT DETECTED NOT DETECTED Final   Escherichia coli NOT DETECTED NOT DETECTED Final   Klebsiella aerogenes NOT DETECTED NOT DETECTED Final   Klebsiella oxytoca NOT DETECTED NOT DETECTED Final   Klebsiella pneumoniae NOT DETECTED NOT DETECTED Final   Proteus species NOT DETECTED NOT DETECTED Final   Salmonella species NOT DETECTED NOT DETECTED Final  Serratia marcescens NOT DETECTED NOT DETECTED Final   Haemophilus influenzae NOT DETECTED NOT DETECTED Final   Neisseria meningitidis NOT DETECTED NOT DETECTED Final   Pseudomonas aeruginosa NOT DETECTED NOT DETECTED Final   Stenotrophomonas maltophilia NOT DETECTED NOT DETECTED Final   Candida albicans NOT DETECTED NOT DETECTED Final   Candida auris NOT DETECTED NOT DETECTED Final   Candida glabrata NOT DETECTED NOT DETECTED Final   Candida krusei NOT DETECTED NOT DETECTED Final   Candida parapsilosis NOT DETECTED NOT DETECTED Final   Candida tropicalis NOT DETECTED NOT DETECTED Final   Cryptococcus neoformans/gattii NOT DETECTED NOT DETECTED Final    Comment: Performed at [facility], [place] [address], [place], [place] [address]     Janene Madeira, MSN, NP-C [facility] for Infectious Disease [email or web address] Pager: [phone] Office: [phone] [facility] Main Line: [place] Communication Welcome

## 2022-04-06 NOTE — ED Notes (Signed)
Wound on left lower leg swabbed and sent to lab.  No new orders at this time.

## 2022-04-06 NOTE — Assessment & Plan Note (Signed)
Patient met sepsis criteria with fever, leukocytosis and tachypnea.  Initial blood pressure was soft which responded well to IV fluid. Blood cultures with strep agalactiae-pending susceptibility and final results. Wound cultures are pending. Initially received broad-spectrum antibiotics. -Antibiotics switched to penicillin G -ID consult -Echocardiogram

## 2022-04-06 NOTE — Progress Notes (Signed)
Pt was initially assessed while febrile and confused. He failed his swallow screen due to lethargy and inability to remain alert. Pt was made NPO. Pt was given antibiotics, Tylenol, and fluids. He became afebrile, more directable, and was given a second swallow screen after he asked for water.  Pt was able to follow directions and swallow safely. His order was changed back to normal diet.

## 2022-04-07 ENCOUNTER — Ambulatory Visit (HOSPITAL_BASED_OUTPATIENT_CLINIC_OR_DEPARTMENT_OTHER): Payer: Self-pay | Admitting: Physical Therapy

## 2022-04-07 ENCOUNTER — Inpatient Hospital Stay (HOSPITAL_COMMUNITY): Payer: BC Managed Care – PPO

## 2022-04-07 DIAGNOSIS — A409 Streptococcal sepsis, unspecified: Secondary | ICD-10-CM | POA: Diagnosis not present

## 2022-04-07 LAB — CBC
HCT: 39.8 % (ref 39.0–52.0)
HCT: 37.3 % — ABNORMAL LOW (ref 39.0–52.0)
Hemoglobin: 13.9 g/dL (ref 13.0–17.0)
Hemoglobin: 12.8 g/dL — ABNORMAL LOW (ref 13.0–17.0)
MCH: 33.7 pg (ref 26.0–34.0)
MCH: 32.7 pg (ref 26.0–34.0)
MCHC: 34.9 g/dL (ref 30.0–36.0)
MCHC: 34.3 g/dL (ref 30.0–36.0)
MCV: 96.4 fL (ref 80.0–100.0)
MCV: 95.2 fL (ref 80.0–100.0)
Platelets: 169 10*3/uL (ref 150–400)
Platelets: 153 10*3/uL (ref 150–400)
RBC: 4.13 MIL/uL — ABNORMAL LOW (ref 4.22–5.81)
RBC: 3.92 MIL/uL — ABNORMAL LOW (ref 4.22–5.81)
RDW: 12.6 % (ref 11.5–15.5)
RDW: 12.6 % (ref 11.5–15.5)
WBC: 11.9 10*3/uL — ABNORMAL HIGH (ref 4.0–10.5)
WBC: 11.9 10*3/uL — ABNORMAL HIGH (ref 4.0–10.5)
nRBC: 0 % (ref 0.0–0.2)
nRBC: 0 % (ref 0.0–0.2)

## 2022-04-07 LAB — BASIC METABOLIC PANEL
Anion gap: 9 (ref 5–15)
BUN: 15 mg/dL (ref 8–23)
CO2: 23 mmol/L (ref 22–32)
Calcium: 8.1 mg/dL — ABNORMAL LOW (ref 8.9–10.3)
Chloride: 101 mmol/L (ref 98–111)
Creatinine, Ser: 0.92 mg/dL (ref 0.61–1.24)
GFR, Estimated: >60 mL/min (ref >60)
Glucose, Bld: 106 mg/dL — ABNORMAL HIGH (ref 70–99)
Potassium: 3.7 mmol/L (ref 3.5–5.1)
Sodium: 133 mmol/L — ABNORMAL LOW (ref 135–145)

## 2022-04-07 LAB — AMMONIA: Ammonia: 47 umol/L — ABNORMAL HIGH (ref 9–35)

## 2022-04-07 LAB — C-REACTIVE PROTEIN
CRP: 23.1 mg/dL — ABNORMAL HIGH (ref <1.0)
CRP: 21.9 mg/dL — ABNORMAL HIGH (ref <1.0)

## 2022-04-07 LAB — SEDIMENTATION RATE: Sed Rate: 34 mm/hr — ABNORMAL HIGH (ref 0–16)

## 2022-04-07 NOTE — Evaluation (Signed)
Physical Therapy Evaluation Patient Details Name: Melvin [name] MRN: [id number] DOB: 1945/11/06 Today's Date: 04/07/2022  History of Present Illness  Pt is 76 year old presented to [facility] on  04/05/22 with fever, weakness, and inability to ambulate. Pt with sepsis due to cellulitis of lt lower leg after traumatic abrasion and failed outpt antibiotic treatment. Pt found to have Group B strep. Pt developed visual hallucinations like due to new meds. Pt with unsteady gait and with very recent neuro eval and presumptive diagnosis of  Parkinson's PMH - scoliosis, lumbar stenosis, DJD lumbar spine, prostate CA. PAD  Clinical Impression  Pt admitted with above diagnosis. Pt was able to transfer to chair with mod assist of 2. Pt with significant decline per wife over last month with new diagnosis of Parkinsons.  Feel that pt would benefit from AIR as he was working PTA and wife works as well. Will follow acutely.  Pt currently with functional limitations due to the deficits listed below (see PT Problem List). Pt will benefit from skilled PT to increase their independence and safety with mobility to allow discharge to the venue listed below.          Recommendations for follow up therapy are one component of a multi-disciplinary discharge planning process, led by the attending physician.  Recommendations may be updated based on patient status, additional functional criteria and insurance authorization.  Follow Up Recommendations Acute inpatient rehab (3hours/day)      Assistance Recommended at Discharge Frequent or constant Supervision/Assistance  Patient can return home with the following  Two people to help with walking and/or transfers;A lot of help with bathing/dressing/bathroom;Assistance with cooking/housework;Assist for transportation;Help with stairs or ramp for entrance    Equipment Recommendations BSC/3in1  Recommendations for Other Services  Rehab consult    Functional Status Assessment Patient  has had a recent decline in their functional status and demonstrates the ability to make significant improvements in function in a reasonable and predictable amount of time.     Precautions / Restrictions Precautions Precautions: Fall Restrictions Weight Bearing Restrictions: No      Mobility  Bed Mobility Overal bed mobility: Needs Assistance Bed Mobility: Rolling, Sidelying to Sit Rolling: Mod assist Sidelying to sit: Mod assist       General bed mobility comments: Pt needed multiple cues and assist for safety to come to EOB as well as to sequence movement. Pt very internally distracted and not following commands without repetition.  Needed assist for LE movement as well as for trunk elevation.    Transfers Overall transfer level: Needs assistance Equipment used: Rolling walker (2 wheels) Transfers: Sit to/from Stand, Bed to chair/wheelchair/BSC Sit to Stand: Mod assist, +2 physical assistance, From elevated surface   Step pivot transfers: Mod assist, +2 physical assistance, From elevated surface       General transfer comment: Pt needed repetitive cues for hand placement as pt kept trying to hold onto RW toward bottom of RW with right hand instead of pushing up from bed.  Finally got pt to push up and he stood with mod assist for power up and once mvoement initiated pt assisted. Pt with posterior lean and kyphotic posture. took multiple small steps to chair with wide BOS and shuffling feet with incr time to step around to chair needing mod guidance as well.  Needed assist to move RW as well.    Ambulation/Gait                  Stairs  Wheelchair Mobility    Modified Rankin (Stroke Patients Only)       Balance Overall balance assessment: Needs assistance Sitting-balance support: Bilateral upper extremity supported, Feet supported Sitting balance-Leahy Scale: Poor Sitting balance - Comments: Pt needed UE support and min guard assist to sit EOB  with posterior and lateral lean to left Postural control: Posterior lean, Left lateral lean Standing balance support: Bilateral upper extremity supported, During functional activity Standing balance-Leahy Scale: Poor Standing balance comment: relies on external and UE support                             Pertinent Vitals/Pain Pain Assessment Pain Assessment: Faces Faces Pain Scale: Hurts even more Pain Location: back in low back with movement Pain Descriptors / Indicators: Aching, Discomfort, Grimacing, Guarding Pain Intervention(s): Limited activity within patient's tolerance, Monitored during session, Repositioned    Home Living Family/patient expects to be discharged to:: Private residence Living Arrangements: Spouse/significant other Available Help at Discharge: Family;Available PRN/intermittently (wife works) Type of Home: Other(Comment) (town house) Home Access: Stairs to enter Entrance Stairs-Rails: Right (Hill in front of house; back of house 4 steps and has rail but pt fell there) Entrance Stairs-Number of Steps: [place]: Two level;Able to live on main level with bedroom/bathroom Home Equipment: Kasandra Knudsen - single point;Rollator (4 wheels) 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    Prior Function               Mobility Comments: used cane and rollator for a few weeks ago ADLs Comments: B/D self, wife only helped with dressings     Hand Dominance   Dominant Hand: Right    Extremity/Trunk Assessment   Upper Extremity Assessment Upper Extremity Assessment: Defer to OT evaluation    Lower Extremity Assessment Lower Extremity Assessment: RLE deficits/detail;LLE deficits/detail RLE Deficits / Details: grossly 3/5 RLE Coordination: decreased gross motor LLE Deficits / Details: grossly 3/5 LLE Coordination: decreased gross motor    Cervical / Trunk Assessment Cervical / Trunk Assessment: Kyphotic  Communication   Communication:  No difficulties  Cognition Arousal/Alertness: Awake/alert Behavior During Therapy: WFL for tasks assessed/performed Overall Cognitive Status: Within Functional Limits for tasks assessed                                          General Comments General comments (skin integrity, edema, etc.): VSS    Exercises General Exercises - Lower Extremity Ankle Circles/Pumps: AROM, Both, 5 reps, Supine Long Arc Quad: AROM, Both, 5 reps, Supine   Assessment/Plan    PT Assessment Patient needs continued PT services  PT Problem List Decreased activity tolerance;Decreased balance;Decreased mobility;Decreased knowledge of use of DME;Decreased safety awareness;Decreased knowledge of precautions;Pain;Decreased coordination;Decreased cognition;Decreased range of motion;Decreased strength       PT Treatment Interventions DME instruction;Gait training;Functional mobility training;Therapeutic activities;Therapeutic exercise;Balance training;Patient/family education    PT Goals (Current goals can be found in the Care Plan section)  Acute Rehab PT Goals Patient Stated Goal: to go home PT Goal Formulation: With patient/family Time For Goal Achievement: 04/21/22 Potential to Achieve Goals: Fair    Frequency Min 3X/week     Co-evaluation               AM-PAC PT "6 Clicks" Mobility  Outcome Measure Help needed turning from your back to your side while  in a flat bed without using bedrails?: A Lot Help needed moving from lying on your back to sitting on the side of a flat bed without using bedrails?: A Lot Help needed moving to and from a bed to a chair (including a wheelchair)?: Total Help needed standing up from a chair using your arms (e.g., wheelchair or bedside chair)?: Total Help needed to walk in hospital room?: Total Help needed climbing 3-5 steps with a railing? : Total 6 Click Score: 8    End of Session Equipment Utilized During Treatment: Gait belt Activity  Tolerance: Patient limited by fatigue Patient left: in chair;with call bell/phone within reach;with chair alarm set;with nursing/sitter in room Nurse Communication: Mobility status PT Visit Diagnosis: Unsteadiness on feet (R26.81);Muscle weakness (generalized) (M62.81);Pain Pain - part of body:  (back)    Time: 1219-7588 PT Time Calculation (min) (ACUTE ONLY): 30 min   Charges:   PT Evaluation $PT Eval Moderate Complexity: 1 Mod PT Treatments $Gait Training: 8-22 mins        [facility] M,PT Acute Rehab Services [place] 04/07/2022, 11:53 AM

## 2022-04-07 NOTE — Progress Notes (Addendum)
1353: Pt has become less oriented. Only oriented to self. Able to differentiate between staff and Wife.  Pt was moved to chair by physical therapy. 2p FWW gaitbelt with heavy queuing. Pt required 3 nurses pivot transfer with heavy assist to get back to bed.  Pt talking more nonsensical.  MD was alerted and this RN requested a neuro consult. No new orders.   1750: Pt is now completely disoriented. Pt thinks all male staff members are his wife. More lethargic and unable to tolerate turns at this point. Pt has increased work of breathing at rest. Spo2 94% on RA. Pt face is flushed, temp 99.41F oral. Pt is now speaking unintelligently, nonsensical.  MD made aware and head CT and labs added. MD was asked by this RN to assess pt and stated, "I already saw him this morning." Rapid nurse called to the bedside to assist with assessment and plan.   New left groin erythema and warmth noted on exam. Marked circumferentially.

## 2022-04-07 NOTE — Progress Notes (Signed)
? ?  Inpatient Rehab Admissions Coordinator : ? ?Per therapy recommendations, patient was screened for CIR candidacy by Masayuki Sakai RN MSN.  At this time patient appears to be a potential candidate for CIR. I will place a rehab consult per protocol for full assessment. Please call me with any questions. ? ?Remie Mathison RN MSN ?Admissions Coordinator ?[phone] ?  ?

## 2022-04-07 NOTE — Progress Notes (Signed)
OT Cancellation Note  Patient Details Name: Melvin [name] MRN: [id number] DOB: Jul 31, 1945   Cancelled Treatment:    Reason Eval/Treat Not Completed: Other (comment) Attempted OT eval though wife present and did not want OT to wake pt though also reports she knows he needs to move around some. Will follow up as schedule permits.   [name] 04/07/2022, 8:27 AM

## 2022-04-07 NOTE — Evaluation (Signed)
Clinical/Bedside Swallow Evaluation Patient Details  Name: Melvin [name] MRN: [id number] Date of Birth: Apr 23, 1946  Today's Date: 04/07/2022 Time: SLP Start Time (ACUTE ONLY): 1155 SLP Stop Time (ACUTE ONLY): 1220 SLP Time Calculation (min) (ACUTE ONLY): 25 min  Past Medical History:  Past Medical History:  Diagnosis Date   Arthritis    Cancer [facility])    Past Surgical History:  Past Surgical History:  Procedure Laterality Date   INGUINAL HERNIA REPAIR Right 04/01/2021   Procedure: LAPAROSCOPIC INGUINAL HERNIA;  Surgeon: Ralene Ok, MD;  Location: [place];  Service: General;  Laterality: Right;   INGUINAL HERNIA REPAIR Left 07/09/2021   Procedure: OPEN LEFT INGUINAL HERNIA REPAIR WITH MESH;  Surgeon: Ralene Ok, MD;  Location: [place];  Service: General;  Laterality: Left;   INSERTION OF MESH Right 04/01/2021   Procedure: INSERTION OF MESH;  Surgeon: Ralene Ok, MD;  Location: [place];  Service: General;  Laterality: Right;   INSERTION OF MESH Left 07/09/2021   Procedure: INSERTION OF MESH;  Surgeon: Ralene Ok, MD;  Location: [place];  Service: General;  Laterality: Left;   LYMPHADENECTOMY Bilateral 09/12/2012   Procedure: LYMPHADENECTOMY;  Surgeon: Alexis Frock, MD;  Location: [facility];  Service: Urology;  Laterality: Bilateral;   ROBOT ASSISTED LAPAROSCOPIC RADICAL PROSTATECTOMY N/A 09/12/2012   Procedure: ROBOTIC ASSISTED LAPAROSCOPIC RADICAL PROSTATECTOMY;  Surgeon: Alexis Frock, MD;  Location: [facility];  Service: Urology;  Laterality: N/A;   TONSILLECTOMY     HPI:  Patient is a 76 y.o. male with PMH: low back pain secondary to scoliosis, GERD, DJD lumbar spine with stenosis, h/o prostate cancer, glaucoma and poor balance. He fell 3-4 weeks ago, sustaining a deep abrasion to left distal LE. This resulted in leg requiring two courses of Doxycycline. He was admitted on 04/05/22 after attending a neurological consultation, experiencing rigors, profound weakness and developed  significant fever upon returning home. In ED, he was febrile at 100.2 degrees F but the rest of his vitals were stable. CXR did not show any acute abnormality, UA negative, Preliminary blood cultures with Streptococcus agalactiae. He met criteria for sepsis, PAD. On 12/7, patient failed first swallow screen with nursing as he was lethargic, however he became more alert and then was able to pass Yale swallow. RN contacted SLP to report that she was concerned of his swallow safety as he is holding food in mouth.    Assessment / Plan / Recommendation  Clinical Impression  Patient presenting with what appears to be a cognitive-based dysphagia as per this bedside swallow evaluation. He required frequent cues to redirect secondary to poor attention, awareness and confusion. He was able to feed himself when food items, drinks were handed to him but he was not able to manage on his own. When eating piece of toast, he shoved entire piece in his mouth before proceeding to chew. SLP did not observe any overt s/s of aspiration or penetration, but he did exhibit delayed mastication and oral residuals post swallows without patient awareness. SLP cued him to complete oral care when he was finished eating. SLP recommending downgrade solids slightly to Dys 3 (mechanical soft), have full supervision and assistance with meals and will follow briefly to ensure toleration and determine readiness to advance to regular textures. SLP Visit Diagnosis: Dysphagia, unspecified (R13.10)    Aspiration Risk  Mild aspiration risk    Diet Recommendation Dysphagia 3 (Mech soft);Thin liquid   Liquid Administration via: Cup;Straw Medication Administration: Whole meds with liquid Supervision: Patient able to  self feed;Full supervision/cueing for compensatory strategies;Staff to assist with self feeding Compensations: Slow rate;Small sips/bites;Minimize environmental distractions Postural Changes: Seated upright at 90 degrees;Remain  upright for at least 30 minutes after po intake    Other  Recommendations Oral Care Recommendations: Oral care BID    Recommendations for follow up therapy are one component of a multi-disciplinary discharge planning process, led by the attending physician.  Recommendations may be updated based on patient status, additional functional criteria and insurance authorization.  Follow up Recommendations Follow physician's recommendations for discharge plan and follow up therapies      Assistance Recommended at Discharge    Functional Status Assessment Patient has had a recent decline in their functional status and demonstrates the ability to make significant improvements in function in a reasonable and predictable amount of time.  Frequency and Duration min 1 x/week  1 week       Prognosis Prognosis for Safe Diet Advancement: Good      Swallow Study   General Date of Onset: 04/07/22 HPI: Patient is a 76 y.o. male with PMH: low back pain secondary to scoliosis, GERD, DJD lumbar spine with stenosis, h/o prostate cancer, glaucoma and poor balance. He fell 3-4 weeks ago, sustaining a deep abrasion to left distal LE. This resulted in leg requiring two courses of Doxycycline. He was admitted on 04/05/22 after attending a neurological consultation, experiencing rigors, profound weakness and developed significant fever upon returning home. In ED, he was febrile at 100.2 degrees F but the rest of his vitals were stable. CXR did not show any acute abnormality, UA negative, Preliminary blood cultures with Streptococcus agalactiae. He met criteria for sepsis, PAD. On 12/7, patient failed first swallow screen with nursing as he was lethargic, however he became more alert and then was able to pass Yale swallow. RN contacted SLP to report that she was concerned of his swallow safety as he is holding food in mouth. Type of Study: Bedside Swallow Evaluation Previous Swallow Assessment: none found Diet Prior to  this Study: Regular;Thin liquids Temperature Spikes Noted: No Respiratory Status: Room air History of Recent Intubation: No Behavior/Cognition: Alert;Cooperative;Pleasant mood;Confused;Distractible;Requires cueing Oral Cavity Assessment: Within Functional Limits Oral Care Completed by SLP: Yes Oral Cavity - Dentition: Adequate natural dentition Vision: Impaired for self-feeding Self-Feeding Abilities: Needs set up;Needs assist Patient Positioning: Upright in bed Baseline Vocal Quality: Normal Volitional Cough: Cognitively unable to elicit Volitional Swallow: Able to elicit    Oral/Motor/Sensory Function Overall Oral Motor/Sensory Function: Within functional limits   Ice Chips     Thin Liquid Thin Liquid: Within functional limits Presentation: Straw    Nectar Thick     Honey Thick     Puree Puree: Not tested   Solid     Solid: Impaired Oral Phase Impairments: Impaired mastication;Poor awareness of bolus Oral Phase Functional Implications: Oral residue;Prolonged oral transit;Impaired mastication     Sonia Baller, MA, CCC-SLP Speech Therapy

## 2022-04-07 NOTE — Progress Notes (Addendum)
[facility] for Infectious Disease   Reason for visit: Follow up on bacteremia  Interval History: no acute events.  WBC 11.9, fever curve trending down.  Wife at bedside.     Physical Exam: Constitutional:  Vitals:   04/07/22 0043 04/07/22 0604  BP: 128/82 (!) 142/73  Pulse: 67 83  Resp:  18  Temp:  (!) 97.3 F (36.3 C)  SpO2:  95%   patient appears in NAD Respiratory: Normal respiratory effort Neuro: confused, redirectable  Review of Systems: Unable to assess due to patient factors  Lab Results  Component Value Date   WBC 11.9 (H) 04/07/2022   HGB 13.9 04/07/2022   HCT 39.8 04/07/2022   MCV 96.4 04/07/2022   PLT 169 04/07/2022    Lab Results  Component Value Date   CREATININE 1.12 04/05/2022   BUN 23 04/05/2022   NA 137 04/05/2022   K 3.9 04/05/2022   CL 107 04/05/2022   CO2 22 04/05/2022    Lab Results  Component Value Date   ALT 21 04/05/2022   AST 33 04/05/2022   ALKPHOS 45 04/05/2022     Microbiology: Recent Results (from the past 240 hour(s))  Resp Panel by RT-PCR (Flu A&B, Covid)     Status: None   Collection Time: 04/05/22  3:23 PM   Specimen: Nasal Swab  Result Value Ref Range Status   SARS Coronavirus 2 by RT PCR NEGATIVE NEGATIVE Final    Comment: (NOTE) SARS-CoV-2 target nucleic acids are NOT DETECTED.  The SARS-CoV-2 RNA is generally detectable in upper respiratory specimens during the acute phase of infection. The lowest concentration of SARS-CoV-2 viral copies this assay can detect is 138 copies/mL. A negative result does not preclude SARS-Cov-2 infection and should not be used as the sole basis for treatment or other patient management decisions. A negative result may occur with  improper specimen collection/handling, submission of specimen other than nasopharyngeal swab, presence of viral mutation(s) within the areas targeted by this assay, and inadequate number of viral copies(<138 copies/mL). A negative result must be  combined with clinical observations, patient history, and epidemiological information. The expected result is Negative.  Fact Sheet for Patients:  EntrepreneurPulse.com.au  Fact Sheet for Healthcare Providers:  IncredibleEmployment.be  This test is no t yet approved or cleared by the [place] FDA and  has been authorized for detection and/or diagnosis of SARS-CoV-2 by FDA under an Emergency Use Authorization (EUA). This EUA will remain  in effect (meaning this test can be used) for the duration of the COVID-19 declaration under Section 564(b)(1) of the Act, 21 U.S.C.section 360bbb-3(b)(1), unless the authorization is terminated  or revoked sooner.       Influenza A by PCR NEGATIVE NEGATIVE Final   Influenza B by PCR NEGATIVE NEGATIVE Final    Comment: (NOTE) The Xpert Xpress SARS-CoV-2/FLU/RSV plus assay is intended as an aid in the diagnosis of influenza from Nasopharyngeal swab specimens and should not be used as a sole basis for treatment. Nasal washings and aspirates are unacceptable for Xpert Xpress SARS-CoV-2/FLU/RSV testing.  Fact Sheet for Patients: EntrepreneurPulse.com.au  Fact Sheet for Healthcare Providers: IncredibleEmployment.be  This test is not yet approved or cleared by the [place] FDA and has been authorized for detection and/or diagnosis of SARS-CoV-2 by FDA under an Emergency Use Authorization (EUA). This EUA will remain in effect (meaning this test can be used) for the duration of the COVID-19 declaration under Section 564(b)(1) of the Act, 21 U.S.C. section  360bbb-3(b)(1), unless the authorization is terminated or revoked.  Performed at [facility], [place] [address], [place], [place] [address]   Blood Culture (routine single)     Status: Abnormal (Preliminary result)   Collection Time: 04/05/22  3:45 PM   Specimen: BLOOD  Result Value Ref Range Status    Specimen Description BLOOD SITE NOT SPECIFIED  Final   Special Requests   Final    BOTTLES DRAWN AEROBIC AND ANAEROBIC Blood Culture adequate volume   Culture  Setup Time   Final    GRAM POSITIVE COCCI IN PAIRS IN CHAINS IN BOTH AEROBIC AND ANAEROBIC BOTTLES CRITICAL RESULT CALLED TO, READ BACK BY AND VERIFIED WITH: Barbaraann Boys RN 04/06/22 @ [phone] BY AB    Culture (A)  Final    GROUP B STREP(S.AGALACTIAE)ISOLATED SUSCEPTIBILITIES TO FOLLOW Performed at [facility], [place] [address], [place],  [address]    Report Status PENDING  Incomplete  Blood Culture ID Panel (Reflexed)     Status: Abnormal   Collection Time: 04/05/22  3:45 PM  Result Value Ref Range Status   Enterococcus faecalis NOT DETECTED NOT DETECTED Final   Enterococcus Faecium NOT DETECTED NOT DETECTED Final   Listeria monocytogenes NOT DETECTED NOT DETECTED Final   Staphylococcus species NOT DETECTED NOT DETECTED Final   Staphylococcus aureus (BCID) NOT DETECTED NOT DETECTED Final   Staphylococcus epidermidis NOT DETECTED NOT DETECTED Final   Staphylococcus lugdunensis NOT DETECTED NOT DETECTED Final   Streptococcus species DETECTED (A) NOT DETECTED Final    Comment: CRITICAL RESULT CALLED TO, READ BACK BY AND VERIFIED WITH: Barbaraann Boys RN 04/06/22 @ [phone] BY AB    Streptococcus agalactiae DETECTED (A) NOT DETECTED Final    Comment: CRITICAL RESULT CALLED TO, READ BACK BY AND VERIFIED WITH: Barbaraann Boys RN 04/06/22 @ [phone] BY AB    Streptococcus pneumoniae NOT DETECTED NOT DETECTED Final   Streptococcus pyogenes NOT DETECTED NOT DETECTED Final   A.calcoaceticus-baumannii NOT DETECTED NOT DETECTED Final   Bacteroides fragilis NOT DETECTED NOT DETECTED Final   Enterobacterales NOT DETECTED NOT DETECTED Final   Enterobacter cloacae complex NOT DETECTED NOT DETECTED Final   Escherichia coli NOT DETECTED NOT DETECTED Final   Klebsiella aerogenes NOT DETECTED NOT DETECTED Final   Klebsiella oxytoca NOT  DETECTED NOT DETECTED Final   Klebsiella pneumoniae NOT DETECTED NOT DETECTED Final   Proteus species NOT DETECTED NOT DETECTED Final   Salmonella species NOT DETECTED NOT DETECTED Final   Serratia marcescens NOT DETECTED NOT DETECTED Final   Haemophilus influenzae NOT DETECTED NOT DETECTED Final   Neisseria meningitidis NOT DETECTED NOT DETECTED Final   Pseudomonas aeruginosa NOT DETECTED NOT DETECTED Final   Stenotrophomonas maltophilia NOT DETECTED NOT DETECTED Final   Candida albicans NOT DETECTED NOT DETECTED Final   Candida auris NOT DETECTED NOT DETECTED Final   Candida glabrata NOT DETECTED NOT DETECTED Final   Candida krusei NOT DETECTED NOT DETECTED Final   Candida parapsilosis NOT DETECTED NOT DETECTED Final   Candida tropicalis NOT DETECTED NOT DETECTED Final   Cryptococcus neoformans/gattii NOT DETECTED NOT DETECTED Final    Comment: Performed at [facility], [address] [place], [place] [address]  Aerobic Culture w Gram Stain (superficial specimen)     Status: None (Preliminary result)   Collection Time: 04/06/22  9:47 AM   Specimen: Wound  Result Value Ref Range Status   Specimen Description WOUND  Final   Special Requests LEFT LEG  Final  Gram Stain   Final    NO WBC SEEN FEW GRAM POSITIVE COCCI IN PAIRS FEW GRAM POSITIVE RODS Performed at [facility], [place] [address], [place], [place] [address]    Culture PENDING  Incomplete   Report Status PENDING  Incomplete    Impression/Plan:  1. Bacteremia - will send repeat blood cultures and will continue to monitor.  TTE without concerns for vegetation.   Continue with penicillin continuous infusion.   2.  Confusion - he continues to have some visual hallucinations, confusion.  I suspect this is multifactorial and includes the carbidopa/levadopa which he just started by his outpatient neurologist.  No acute need for this so I will stop this for now and he can restart as an outpatient.   Consider  inpatient neurology evaluation if his confusion persists or if it is felt that he needs this medication now.      3.  Leg wound - likely nidus of #1 and on antibiotics as above.  Wound swab sent of little significance.

## 2022-04-07 NOTE — Progress Notes (Signed)
Consultation Progress Note   Patient: Melvin [name] MRN:[id number] DOB: 08/19/1945 DOA: 04/05/2022 DOS: the patient was seen and examined on 04/07/2022 Primary service: ,  E, MD  Brief hospital course: Taken from H&P.  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 He fell 3-4 weeks ago sustaining deep abrasion to his left distal LE. He has had a full course of doxycycline and then a second course when the infection recurred. Today, after a neurologic consultation he got home and experienced rigors, profound weakness, developed a significant fever. Due to his inability to ambulate EMS was activated and he was brought to [facility] for evaluation.     ED course.  He was febrile at 100.2, rest of the vitals stable, left lower extremity wound looks infected.  Labs pertinent for leukocytosis with left shift. Chest x-ray with no acute abnormality.  UA negative.  COVID-19 and flu PCR negative. Preliminary blood cultures with Streptococcus agalactiae-pending final results. Antibiotics switched to penicillin G..  ID was consulted Echocardiogram ordered. Wound cultures are pending.    Assessment and Plan: * Streptococcal sepsis, unspecified (HCC) Patient met sepsis criteria with fever, leukocytosis and tachypnea.  Initial blood pressure was soft which responded well to IV fluid. Blood cultures with strep agalactiae-pending susceptibility and final results. Wound cultures are pending. Initially received broad-spectrum antibiotics. -Antibiotics switched to penicillin G -Appreciate ID eval -Echocardiogram  Cellulitis of left lower leg Patient with traumatic abrasion that became infected. He has failed outpatient oral abx with doxycycline. -Management as above  PAD (peripheral artery disease) (HCC) On exam trace DP pulse left foot, slow capillary refill mid foot and toes. Concern that  slow healing in part due to PAD.  Plan ABI exam  GERD (gastroesophageal reflux disease) Will continue H2 blocker therapy  Unsteady gait Patient with very recent neuro eval and presumptive diagnosis of Parkinson's disease. MRI and labs ordered by neuro are pending.   Plan Sinemet as ordered by neuro.         TRH will continue to follow the patient.  Subjective: No acute overnight events  Physical Exam: Vitals:   04/06/22 2113 04/07/22 0042 04/07/22 0043 04/07/22 0604  BP: 139/69 (!) 145/118 128/82 (!) 142/73  Pulse: 65 66 67 83  Resp: 17 18  18  Temp: 97.7 F (36.5 C) 98.7 F (37.1 C)  (!) 97.3 F (36.3 C)  TempSrc:    Oral  SpO2: 97% 99%  95%  Weight:      Height:      General.  Frail elderly man, in no acute distress. Pulmonary.  Lungs clear bilaterally, normal respiratory effort. CV.  Regular rate and rhythm, no JVD, rub or murmur. Abdomen.  Soft, nontender, nondistended, BS positive. CNS.  Alert and oriented .  No focal neurologic deficit. Extremities.  Left lower leg edema with surrounding erythema and a bandage placed on mid shin. Psychiatry.  Judgment and insight appears normal.     Data Reviewed:  There are no new results to review at this time.    Time spent: 15 minutes.  Author:  E , MD 04/07/2022 12:26 PM  For on call review [email or web address].  

## 2022-04-08 ENCOUNTER — Encounter (HOSPITAL_COMMUNITY): Payer: Self-pay | Admitting: Internal Medicine

## 2022-04-08 ENCOUNTER — Inpatient Hospital Stay (HOSPITAL_COMMUNITY): Payer: BC Managed Care – PPO

## 2022-04-08 ENCOUNTER — Telehealth: Payer: Self-pay | Admitting: Neurology

## 2022-04-08 DIAGNOSIS — A409 Streptococcal sepsis, unspecified: Secondary | ICD-10-CM | POA: Diagnosis not present

## 2022-04-08 LAB — CULTURE, BLOOD (SINGLE): Special Requests: ADEQUATE

## 2022-04-08 LAB — CBC WITH DIFFERENTIAL/PLATELET
Abs Immature Granulocytes: 0.08 10*3/uL — ABNORMAL HIGH (ref 0.00–0.07)
Basophils Absolute: 0 10*3/uL (ref 0.0–0.1)
Basophils Relative: 0 %
Eosinophils Absolute: 0.2 10*3/uL (ref 0.0–0.5)
Eosinophils Relative: 2 %
HCT: 36.5 % — ABNORMAL LOW (ref 39.0–52.0)
Hemoglobin: 12.6 g/dL — ABNORMAL LOW (ref 13.0–17.0)
Immature Granulocytes: 1 %
Lymphocytes Relative: 9 %
Lymphs Abs: 0.9 10*3/uL (ref 0.7–4.0)
MCH: 32.9 pg (ref 26.0–34.0)
MCHC: 34.5 g/dL (ref 30.0–36.0)
MCV: 95.3 fL (ref 80.0–100.0)
Monocytes Absolute: 0.9 10*3/uL (ref 0.1–1.0)
Monocytes Relative: 9 %
Neutro Abs: 8.1 10*3/uL — ABNORMAL HIGH (ref 1.7–7.7)
Neutrophils Relative %: 79 %
Platelets: 148 10*3/uL — ABNORMAL LOW (ref 150–400)
RBC: 3.83 MIL/uL — ABNORMAL LOW (ref 4.22–5.81)
RDW: 12.5 % (ref 11.5–15.5)
WBC: 10.2 10*3/uL (ref 4.0–10.5)
nRBC: 0 % (ref 0.0–0.2)

## 2022-04-08 LAB — COMPREHENSIVE METABOLIC PANEL
ALT: 33 U/L (ref 0–44)
AST: 30 U/L (ref 15–41)
Albumin: 2.6 g/dL — ABNORMAL LOW (ref 3.5–5.0)
Alkaline Phosphatase: 57 U/L (ref 38–126)
Anion gap: 9 (ref 5–15)
BUN: 13 mg/dL (ref 8–23)
CO2: 25 mmol/L (ref 22–32)
Calcium: 8.3 mg/dL — ABNORMAL LOW (ref 8.9–10.3)
Chloride: 99 mmol/L (ref 98–111)
Creatinine, Ser: 0.9 mg/dL (ref 0.61–1.24)
GFR, Estimated: >60 mL/min (ref >60)
Glucose, Bld: 117 mg/dL — ABNORMAL HIGH (ref 70–99)
Potassium: 3.7 mmol/L (ref 3.5–5.1)
Sodium: 133 mmol/L — ABNORMAL LOW (ref 135–145)
Total Bilirubin: 0.6 mg/dL (ref 0.3–1.2)
Total Protein: 5.3 g/dL — ABNORMAL LOW (ref 6.5–8.1)

## 2022-04-08 LAB — GLUCOSE, CAPILLARY
Glucose-Capillary: 113 mg/dL — ABNORMAL HIGH (ref 70–99)
Glucose-Capillary: 107 mg/dL — ABNORMAL HIGH (ref 70–99)

## 2022-04-08 MED ORDER — BISACODYL 10 MG RE SUPP
10.0000 mg | Freq: Every day | RECTAL | Status: DC | PRN
  Filled 2022-04-08 (×2): qty 1

## 2022-04-08 NOTE — Telephone Encounter (Signed)
[name] called in stating the pt is currently admitted to [facility]. He has sepsis. She is wondering if he could do the MRI while he is in the hospital?

## 2022-04-08 NOTE — Progress Notes (Signed)
OT Cancellation Note  Patient Details Name: Melvin [name] MRN: [id number] DOB: 1945-07-15   Cancelled Treatment:    Reason Eval/Treat Not Completed: Other (comment) (Pt lethargic; family member present and providing home set-up and PLOF; politely asking to return at a later time as pt very sleepy.)  [name], OTR/L [facility] Acute Rehabilitation Office: [phone]   [name] 04/08/2022, 11:30 AM

## 2022-04-08 NOTE — Progress Notes (Addendum)
Patient with improved mentation this shift. Patient answers all orientation correctly. Wife reports that the patient is more with it today. Patient spoke to her daughter on the phone. [name] continues to have occasional confused conversation and occasional hallucinations. Redness to groin remains within outlined area. MEWS score remain green.  3343: Melvin Oppenheim DO notified that patient did not have any labs scheduled for this morning. New orders for a.m. labs obtained. Also notified him of CT head results. Per Bridgett Larsson DO "rounding attending order the CT at 5 pm yesterday. her job to check the results"

## 2022-04-08 NOTE — Progress Notes (Signed)
Speech Language Pathology Treatment: Dysphagia  Patient Details Name: Melvin [name] MRN: [id number] DOB: Jun 08, 1945 Today's Date: 04/08/2022 Time: 7416-3845 SLP Time Calculation (min) (ACUTE ONLY): 25 min  Assessment / Plan / Recommendation Clinical Impression  Pt seen at bedside for skilled ST intervention to assess tolerance of Dys3/thin liquid diet and provide education regarding the importance of positioning given GERD. [name] was at bedside assisting him with eating a banana and drinking coffee via straw. No overt s/s aspiration on these textures. [name] reported a change in pt's voicing since admit (more raspy). Pt/Wife were receptive to education regarding the importance of positioning >/= 45 degrees to minimize possibility of acid spilling into the airway. Pt is taking a PPI, however, wife reports that his bed has been left flat for sleeping, and pt is used to sleeping with head raised.   Given significance of pt's GERD, recommend not lowering the HOB more than 45 degrees between meals. Pt should be positioned fully upright during meals and for 30 minutes after. Safe swallow precautions and request for [facility] lowered no more than 45 degrees posted at [facility].   HPI HPI: Patient is a 76 y.o. male with PMH: low back pain secondary to scoliosis, GERD, DJD lumbar spine with stenosis, h/o prostate cancer, glaucoma and poor balance. He fell 3-4 weeks ago, sustaining a deep abrasion to left distal LE. This resulted in leg requiring two courses of Doxycycline. He was admitted on 04/05/22 after attending a neurological consultation, experiencing rigors, profound weakness and developed significant fever upon returning home. In ED, he was febrile at 100.2 degrees F but the rest of his vitals were stable. CXR did not show any acute abnormality, UA negative, Preliminary blood cultures with Streptococcus agalactiae. He met criteria for sepsis, PAD. On 12/7, patient failed first swallow screen with nursing as he  was lethargic, however he became more alert and then was able to pass Yale swallow. RN contacted SLP to report that she was concerned of his swallow safety as he is holding food in mouth.      SLP Plan  Continue with current plan of care      Recommendations for follow up therapy are one component of a multi-disciplinary discharge planning process, led by the attending physician.  Recommendations may be updated based on patient status, additional functional criteria and insurance authorization.    Recommendations  Diet recommendations: Dysphagia 3 (mechanical soft);Thin liquid Liquids provided via: Cup;Straw Medication Administration: Whole meds with liquid Supervision: Staff to assist with self feeding;Patient able to self feed Compensations: Slow rate;Small sips/bites;Minimize environmental distractions Postural Changes and/or Swallow Maneuvers: Seated upright 90 degrees;Upright 30-60 min after meal                Oral Care Recommendations: Oral care BID Follow Up Recommendations: Follow physician's recommendations for discharge plan and follow up therapies Assistance recommended at discharge: Frequent or constant Supervision/Assistance SLP Visit Diagnosis: Dysphagia, unspecified (R13.10) Plan: Continue with current plan of care          Tekoa Amon B. Quentin Ore, [facility], [place] Speech Language Pathologist Office: [phone]  Shonna Chock 04/08/2022, 9:28 AM

## 2022-04-08 NOTE — Progress Notes (Signed)
[facility]ious [facility]   Reason for visit: Follow up on bacteremia  Interval History: repeat blood cultures ngtd, WBC 10.2, afebrile > 24 hours.   Day 4 total antibiotics  Physical Exam: Constitutional:  Vitals:   04/08/22 0215 04/08/22 0846  BP: (!) 151/84 (!) 187/95  Pulse: 65 66  Resp: 16 15  Temp: 98.3 F (36.8 C) 98.7 F (37.1 C)  SpO2: 99% 96%   patient appears in NAD Respiratory: Normal respiratory effort; CTA B Cardiovascular: RRR Neuro: alert, oriented   Review of Systems: Constitutional: negative for fevers and chills  Lab Results  Component Value Date   WBC 10.2 04/08/2022   HGB 12.6 (L) 04/08/2022   HCT 36.5 (L) 04/08/2022   MCV 95.3 04/08/2022   PLT 148 (L) 04/08/2022    Lab Results  Component Value Date   CREATININE 0.90 04/08/2022   BUN 13 04/08/2022   NA 133 (L) 04/08/2022   K 3.7 04/08/2022   CL 99 04/08/2022   CO2 25 04/08/2022    Lab Results  Component Value Date   ALT 33 04/08/2022   AST 30 04/08/2022   ALKPHOS 57 04/08/2022     Microbiology: Recent Results (from the past 240 hour(s))  Resp Panel by RT-PCR (Flu A&B, Covid)     Status: None   Collection Time: 04/05/22  3:23 PM   Specimen: Nasal Swab  Result Value Ref Range Status   SARS Coronavirus 2 by RT PCR NEGATIVE NEGATIVE Final    Comment: (NOTE) SARS-CoV-2 target nucleic acids are NOT DETECTED.  The SARS-CoV-2 RNA is generally detectable in upper respiratory specimens during the acute phase of infection. The lowest concentration of SARS-CoV-2 viral copies this assay can detect is 138 copies/mL. A negative result does not preclude SARS-Cov-2 infection and should not be used as the sole basis for treatment or other patient management decisions. A negative result may occur with  improper specimen collection/handling, submission of specimen other than nasopharyngeal swab, presence of viral mutation(s) within the areas targeted by this assay, and inadequate  number of viral copies(<138 copies/mL). A negative result must be combined with clinical observations, patient history, and epidemiological information. The expected result is Negative.  Fact Sheet for Patients:  EntrepreneurPulse.com.au  Fact Sheet for Healthcare Providers:  IncredibleEmployment.be  This test is no t yet approved or cleared by the [place] FDA and  has been authorized for detection and/or diagnosis of SARS-CoV-2 by FDA under an Emergency Use Authorization (EUA). This EUA will remain  in effect (meaning this test can be used) for the duration of the COVID-19 declaration under Section 564(b)(1) of the Act, 21 U.S.C.section 360bbb-3(b)(1), unless the authorization is terminated  or revoked sooner.       Influenza A by PCR NEGATIVE NEGATIVE Final   Influenza B by PCR NEGATIVE NEGATIVE Final    Comment: (NOTE) The Xpert Xpress SARS-CoV-2/FLU/RSV plus assay is intended as an aid in the diagnosis of influenza from Nasopharyngeal swab specimens and should not be used as a sole basis for treatment. Nasal washings and aspirates are unacceptable for Xpert Xpress SARS-CoV-2/FLU/RSV testing.  Fact Sheet for Patients: EntrepreneurPulse.com.au  Fact Sheet for Healthcare Providers: IncredibleEmployment.be  This test is not yet approved or cleared by the [place] FDA and has been authorized for detection and/or diagnosis of SARS-CoV-2 by FDA under an Emergency Use Authorization (EUA). This EUA will remain in effect (meaning this test can be used) for the duration of the COVID-19 declaration  under Section 564(b)(1) of the Act, 21 U.S.C. section 360bbb-3(b)(1), unless the authorization is terminated or revoked.  Performed at [facility], [place] [address] [place], [place] [address]   Blood Culture (routine single)     Status: Abnormal   Collection Time: 04/05/22  3:45 PM   Specimen:  BLOOD  Result Value Ref Range Status   Specimen Description BLOOD SITE NOT SPECIFIED  Final   Special Requests   Final    BOTTLES DRAWN AEROBIC AND ANAEROBIC Blood Culture adequate volume   Culture  Setup Time   Final    GRAM POSITIVE COCCI IN PAIRS IN CHAINS IN BOTH AEROBIC AND ANAEROBIC BOTTLES CRITICAL RESULT CALLED TO, READ BACK BY AND VERIFIED WITH: Barbaraann Boys RN 04/06/22 @ 0643 BY AB Performed at [facility], [place] [address], [place], [place] [address]    Culture GROUP B STREP(S.AGALACTIAE)ISOLATED (A)  Final   Report Status 04/08/2022 FINAL  Final   Organism ID, Bacteria GROUP B STREP(S.AGALACTIAE)ISOLATED  Final      Susceptibility   Group b strep(s.agalactiae)isolated - MIC*    CLINDAMYCIN <=0.25 SENSITIVE Sensitive     AMPICILLIN <=0.25 SENSITIVE Sensitive     ERYTHROMYCIN <=0.12 SENSITIVE Sensitive     VANCOMYCIN 0.5 SENSITIVE Sensitive     CEFTRIAXONE <=0.12 SENSITIVE Sensitive     LEVOFLOXACIN 1 SENSITIVE Sensitive     PENICILLIN <=0.06 SENSITIVE Sensitive     * GROUP B STREP(S.AGALACTIAE)ISOLATED  Blood Culture ID Panel (Reflexed)     Status: Abnormal   Collection Time: 04/05/22  3:45 PM  Result Value Ref Range Status   Enterococcus faecalis NOT DETECTED NOT DETECTED Final   Enterococcus Faecium NOT DETECTED NOT DETECTED Final   Listeria monocytogenes NOT DETECTED NOT DETECTED Final   Staphylococcus species NOT DETECTED NOT DETECTED Final   Staphylococcus aureus (BCID) NOT DETECTED NOT DETECTED Final   Staphylococcus epidermidis NOT DETECTED NOT DETECTED Final   Staphylococcus lugdunensis NOT DETECTED NOT DETECTED Final   Streptococcus species DETECTED (A) NOT DETECTED Final    Comment: CRITICAL RESULT CALLED TO, READ BACK BY AND VERIFIED WITH: Barbaraann Boys RN 04/06/22 @ [phone] BY AB    Streptococcus agalactiae DETECTED (A) NOT DETECTED Final    Comment: CRITICAL RESULT CALLED TO, READ BACK BY AND VERIFIED WITH: Barbaraann Boys RN 04/06/22 @ 1914 BY AB     Streptococcus pneumoniae NOT DETECTED NOT DETECTED Final   Streptococcus pyogenes NOT DETECTED NOT DETECTED Final   A.calcoaceticus-baumannii NOT DETECTED NOT DETECTED Final   Bacteroides fragilis NOT DETECTED NOT DETECTED Final   Enterobacterales NOT DETECTED NOT DETECTED Final   Enterobacter cloacae complex NOT DETECTED NOT DETECTED Final   Escherichia coli NOT DETECTED NOT DETECTED Final   Klebsiella aerogenes NOT DETECTED NOT DETECTED Final   Klebsiella oxytoca NOT DETECTED NOT DETECTED Final   Klebsiella pneumoniae NOT DETECTED NOT DETECTED Final   Proteus species NOT DETECTED NOT DETECTED Final   Salmonella species NOT DETECTED NOT DETECTED Final   Serratia marcescens NOT DETECTED NOT DETECTED Final   Haemophilus influenzae NOT DETECTED NOT DETECTED Final   Neisseria meningitidis NOT DETECTED NOT DETECTED Final   Pseudomonas aeruginosa NOT DETECTED NOT DETECTED Final   Stenotrophomonas maltophilia NOT DETECTED NOT DETECTED Final   Candida albicans NOT DETECTED NOT DETECTED Final   Candida auris NOT DETECTED NOT DETECTED Final   Candida glabrata NOT DETECTED NOT DETECTED Final   Candida krusei NOT DETECTED NOT DETECTED Final   Candida parapsilosis NOT DETECTED  NOT DETECTED Final   Candida tropicalis NOT DETECTED NOT DETECTED Final   Cryptococcus neoformans/gattii NOT DETECTED NOT DETECTED Final    Comment: Performed at [facility], [address], [place], [place] [address]  Aerobic Culture w Gram Stain (superficial specimen)     Status: None (Preliminary result)   Collection Time: 04/06/22  9:47 AM   Specimen: Wound  Result Value Ref Range Status   Specimen Description WOUND  Final   Special Requests LEFT LEG  Final   Gram Stain   Final    NO WBC SEEN FEW GRAM POSITIVE COCCI IN PAIRS FEW GRAM POSITIVE RODS    Culture   Final    CULTURE REINCUBATED FOR BETTER GROWTH Performed at [facility], [place] [address], [place], [place] [address]    Report Status PENDING   Incomplete  Culture, blood (Routine X 2) w Reflex to ID Panel     Status: None (Preliminary result)   Collection Time: 04/07/22  1:20 PM   Specimen: BLOOD RIGHT FOREARM  Result Value Ref Range Status   Specimen Description BLOOD RIGHT FOREARM  Final   Special Requests   Final    BOTTLES DRAWN AEROBIC AND ANAEROBIC Blood Culture results may not be optimal due to an inadequate volume of blood received in culture bottles   Culture   Final    NO GROWTH < 24 HOURS Performed at [facility], [place] [address] [place], [place] [address]    Report Status PENDING  Incomplete  Culture, blood (Routine X 2) w Reflex to ID Panel     Status: None (Preliminary result)   Collection Time: 04/07/22  1:29 PM   Specimen: BLOOD RIGHT FOREARM  Result Value Ref Range Status   Specimen Description BLOOD RIGHT FOREARM  Final   Special Requests   Final    BOTTLES DRAWN AEROBIC AND ANAEROBIC Blood Culture results may not be optimal due to an inadequate volume of blood received in culture bottles   Culture   Final    NO GROWTH < 24 HOURS Performed at [facility], [place] [address] [place],  [address]    Report Status PENDING  Incomplete    Impression/Plan:  1. Group B Strep bacteremia - improving on IV penicillin and repeat blood cultures ngtd.  No issues with the medication and no changes.   TTE without obvious vegetation  Can continue with penicillin and if the repeat blood cultures remain negative, can continue with oral amoxicillin 1000 mg three times a day at discharge to complete 14 days total treatment.    2.  Weakness - likely from this illness.  Recommendation for rehab at discharge.   3.  Medication-induced encephalopathy - he was started on carbidopa/levadopa this admission and was experiencing visual hallucinations and confusion.  This has been stopped and he seems to be improving.  At this point I recommend holding it until he improves and follows up with neurology.    Dr. Tommy Medal available over the weekend if needed, otherwise I will follow up on Monday

## 2022-04-08 NOTE — Progress Notes (Signed)
  Inpatient Rehabilitation Admissions Coordinator   Met with patient and wife at bedside for rehab assessment. We discussed goals and expectations of a possible CIR admit. She has to further discuss with family and patient what caregivers may be available after a Cir admit. He could benefit from a Cir admit, but will need caregivers supports .We will further discus his progress Monday and if CIR vs SNF will be recommended. Please call me with any questions.   Danne Baxter, RN, MSN Rehab Admissions Coordinator [phone]

## 2022-04-08 NOTE — Evaluation (Signed)
Occupational Therapy Evaluation Patient Details Name: Melvin [name] MRN: [id number] DOB: [birth year] Today's Date: 04/08/2022   History of Present Illness Pt is 76 year old presented to [facility] on  04/05/22 with fever, weakness, and inability to ambulate. Pt with sepsis due to cellulitis of lt lower leg after traumatic abrasion and failed outpt antibiotic treatment. Pt found to have Group B strep. Pt developed visual hallucinations like due to new meds. Pt with unsteady gait and with very recent neuro eval and presumptive diagnosis of  Parkinson's PMH - scoliosis, lumbar stenosis, DJD lumbar spine, prostate CA. PAD   Clinical Impression   PTA, pt lived with wife, was independent, and working. Upon eval, pt requires mod A +2 for step pivot transfers with RW, min guard A for unsupported sitting, up to mod A for UB ADL and up to max A +2 for LB ADL. Pt oriented when testing at end of session, but initially asking when going back to his room (despite being in room). Pt required verbal cues for all mobility and frequently stating he did not understand instructions the first time provided. Pt with decreased strength, balance, coordination, and cognition. Due to pt support and significant change in functional status, highly recommending AIR to optimize safety and independence in ADL and IADL.     Recommendations for follow up therapy are one component of a multi-disciplinary discharge planning process, led by the attending physician.  Recommendations may be updated based on patient status, additional functional criteria and insurance authorization.   Follow Up Recommendations  Acute inpatient rehab (3hours/day)     Assistance Recommended at Discharge Intermittent Supervision/Assistance  Patient can return home with the following Two people to help with walking and/or transfers;Two people to help with bathing/dressing/bathroom;Assistance with cooking/housework;Help with stairs or ramp for entrance;Assist for  transportation;Direct supervision/assist for financial management;Direct supervision/assist for medications management;Assistance with feeding    Functional Status Assessment  Patient has had a recent decline in their functional status and demonstrates the ability to make significant improvements in function in a reasonable and predictable amount of time.  Equipment Recommendations  Other (comment) (defer to next venue)    Recommendations for Other Services Rehab consult     Precautions / Restrictions Precautions Precautions: Fall Restrictions Weight Bearing Restrictions: No      Mobility Bed Mobility Overal bed mobility: Needs Assistance Bed Mobility: Rolling, Sidelying to Sit Rolling: Mod assist Sidelying to sit: Mod assist       General bed mobility comments: Pt needed multiple cues and assist for safety to come to EOB as well as to sequence movement. Pt very internally distracted and not following commands without repetition.  Needed assist for LE movement as well as for trunk elevation.    Transfers Overall transfer level: Needs assistance Equipment used: Rolling walker (2 wheels) Transfers: Sit to/from Stand, Bed to chair/wheelchair/BSC Sit to Stand: Mod assist, +2 physical assistance, From elevated surface     Step pivot transfers: Mod assist, +2 physical assistance, From elevated surface     General transfer comment: Pt needed repetitive cues for hand placement as pt kept trying to hold onto RW toward bottom of RW with right hand instead of pushing up from bed.  Finally got pt to push up and he stood with mod assist for power up and once mvoement initiated pt assisted. Pt with posterior lean and kyphotic posture. took multiple small steps to chair with wide BOS and shuffling feet with incr time to step around to chair needing mod  guidance as well.  Needed assist to move RW as well.      Balance Overall balance assessment: Needs assistance Sitting-balance support:  Bilateral upper extremity supported, Feet supported Sitting balance-Leahy Scale: Poor Sitting balance - Comments: Pt needed UE support and min guard assist to sit EOB with posterior and lateral lean to left Postural control: Posterior lean, Left lateral lean Standing balance support: Bilateral upper extremity supported, During functional activity Standing balance-Leahy Scale: Poor Standing balance comment: relies on external and UE support                           ADL either performed or assessed with clinical judgement   ADL Overall ADL's : Needs assistance/impaired Eating/Feeding: Set up;Sitting Eating/Feeding Details (indicate cue type and reason): [name] reported this morning she had been having to feed him, however, pt with physical capacity to self feed Grooming: Minimal assistance;Sitting;Moderate assistance Grooming Details (indicate cue type and reason): Min-mod A for sitting balance when not supporting self with BUE Upper Body Bathing: Minimal assistance;Sitting   Lower Body Bathing: Maximal assistance;Sit to/from stand   Upper Body Dressing : Minimal assistance;Sitting;Moderate assistance   Lower Body Dressing: Maximal assistance;Sit to/from stand   Toilet Transfer: Moderate assistance;+2 for physical assistance;+2 for safety/equipment;Stand-pivot;Rolling walker (2 wheels);BSC/3in1 Toilet Transfer Details (indicate cue type and reason): simulated to recliner         Functional mobility during ADLs: Moderate assistance;+2 for physical assistance;+2 for safety/equipment;Rolling walker (2 wheels) General ADL Comments: limited by strength, balance, and pain     Vision Baseline Vision/History: 1 Wears glasses Ability to See in Adequate Light: 0 Adequate Patient Visual Report: No change from baseline Vision Assessment?: Vision impaired- to be further tested in functional context Additional Comments: Min undershooting during finger to nose     Perception  Perception Perception Tested?: No   Praxis Praxis Praxis tested?: Not tested    Pertinent Vitals/Pain Pain Assessment Pain Assessment: Faces Faces Pain Scale: Hurts even more Pain Location: back Pain Descriptors / Indicators: Aching, Discomfort, Grimacing, Guarding Pain Intervention(s): Limited activity within patient's tolerance, Monitored during session, Repositioned     Hand Dominance Right   Extremity/Trunk Assessment Upper Extremity Assessment Upper Extremity Assessment: Generalized weakness   Lower Extremity Assessment Lower Extremity Assessment: Defer to PT evaluation   Cervical / Trunk Assessment Cervical / Trunk Assessment: Kyphotic   Communication Communication Communication: No difficulties   Cognition Arousal/Alertness: Awake/alert Behavior During Therapy: WFL for tasks assessed/performed Overall Cognitive Status: Impaired/Different from baseline Area of Impairment: Memory, Following commands, Problem solving                     Memory: Decreased short-term memory Following Commands: Follows one step commands consistently, Follows one step commands with increased time, Follows multi-step commands inconsistently     Problem Solving: Slow processing, Difficulty sequencing, Requires verbal cues General Comments: Pt confused on entry stating "when are we going back to the room", however when performing orientation testing, pt is oriented. Pt requiring verbal cies for sequencing of all mobility and transfers. Pt asking for repeated instructions stating he does not understand multiple times despite provision of simple instruction     General Comments  VSS. 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      Prior Functioning/Environment Prior Level of Function : Independent/Modified Independent;Needs assist;Driving;Working/employed             Mobility Comments: used cane and rollator for a few weeks ago ADLs Comments: B/D self, wife only helped with dressings; professor at [employer] List: Decreased strength;Impaired balance (sitting and/or standing);Decreased activity tolerance;Impaired vision/perception;Decreased coordination;Decreased cognition;Decreased safety awareness;Decreased knowledge of use of DME or AE;Pain      OT Treatment/Interventions: Self-care/ADL training;Therapeutic exercise;DME and/or AE instruction;Therapeutic activities;Cognitive remediation/compensation;Patient/family education;Balance training    OT Goals(Current goals can be found in the care plan section) Acute Rehab OT Goals Patient Stated Goal: less back pain OT Goal Formulation: With patient Time For Goal Achievement: 04/22/22 Potential to Achieve Goals: Good  OT Frequency: Min 2X/week    Co-evaluation              AM-PAC OT "6 Clicks" Daily Activity     Outcome Measure Help from another person eating meals?: A Little Help from another person taking care of personal grooming?: A Lot Help from another person toileting, which includes using toliet, bedpan, or urinal?: A Lot Help from another person bathing (including washing, rinsing, drying)?: A Lot Help from another person to put on and taking off regular upper body clothing?: A Lot Help from another person to put on and taking off regular lower body clothing?: A  Lot 6 Click Score: 13   End of Session Equipment Utilized During Treatment: Gait belt;Rolling walker (2 wheels) Nurse Communication: Mobility status  Activity Tolerance: Patient tolerated treatment well Patient left: in chair;with call bell/phone within reach;with chair alarm set;with nursing/sitter in room  OT Visit Diagnosis: Unsteadiness on feet (R26.81);Other abnormalities of gait and mobility (R26.89);Muscle weakness (generalized) (M62.81);Other symptoms and signs involving cognitive function;Pain Pain - part of body:  (back)                Time: 0175-1025 OT Time Calculation (min): 18 min Charges:  OT General Charges $OT Visit: 1 Visit OT Evaluation $OT Eval Moderate Complexity: 1 Mod [name], OTR/L [facility] Acute Rehabilitation Office: [phone]    [name] 04/08/2022, 5:34 PM

## 2022-04-08 NOTE — Progress Notes (Signed)
Consultation Progress Note   Patient: Melvin [name] YYT:[id number] DOB: 11/23/1945 DOA: 04/05/2022 DOS: the patient was seen and examined on 04/08/2022 Primary service: Lindalee Huizinga, Manfred Shirts, MD  Brief hospital course: Taken from H&P.  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 He fell 3-4 weeks ago sustaining deep abrasion to his left distal LE. He has had a full course of doxycycline and then a second course when the infection recurred. Today, after a neurologic consultation he got home and experienced rigors, profound weakness, developed a significant fever. Due to his inability to ambulate EMS was activated and he was brought to [facility] for evaluation.     ED course.  He was febrile at 100.2, rest of the vitals stable, left lower extremity wound looks infected.  Labs pertinent for leukocytosis with left shift. Chest x-ray with no acute abnormality.  UA negative.  COVID-19 and flu PCR negative. Preliminary blood cultures with Streptococcus agalactiae-pending final results. Antibiotics switched to penicillin G..  ID was consulted Echocardiogram ordered. Wound cultures are pending.   Assessment and Plan: Altered Mental status -Likely due to Delirium in setting of sepsis. CT was concerning for NPH, Discussed with Neurology they had low suspicion and requested an MRI W,WO for further evaluation Streptococcal sepsis, unspecified ([place]) Patient met sepsis criteria with fever, leukocytosis and tachypnea.  Initial blood pressure was soft which responded well to IV fluid. Blood cultures with strep agalactiae-pending susceptibility and final results. Wound cultures are pending. Initially received broad-spectrum antibiotics. -Antibiotics switched to penicillin G -ID consulted -Echocardiogram showed no vegetations  Cellulitis of left lower leg Patient with traumatic abrasion that became  infected. He has failed outpatient oral abx with doxycycline. -Management as above  PAD (peripheral artery disease) ([place]) On exam trace DP pulse left foot, slow capillary refill mid foot and toes. Concern that slow healing in part due to PAD.  Plan ABI exam  GERD (gastroesophageal reflux disease) Will continue H2 blocker therapy  Unsteady gait Patient with very recent neuro eval and presumptive diagnosis of Parkinson's disease. MRI and labs ordered by neuro are pending.   Plan Sinemet as ordered by neuro.         TR[facility] will continue to follow the patient.  Subjective: No acute overnight events, wife at bedside thinks he has made significant improvement compared to time of admission. Patient states he feels better, he requests for suppository for ongoing constipation  Physical Exam: Vitals:   04/07/22 1655 04/07/22 1930 04/08/22 0215 04/08/22 0846  BP: (!) 161/86 (!) 146/76 (!) 151/84 (!) 187/95  Pulse: 69 64 65 66  Resp: _0 Temp: 99.5 F (37.5 C) 98.6 F (37 C) 98.3 F (36.8 C) 98.7 F (37.1 C)  TempSrc: Oral Oral Oral Oral  SpO2: 94% 98% 99% 96%  Weight:      Height:      General.  Frail elderly man, in no acute distress. Pulmonary.  Lungs clear bilaterally, normal respiratory effort. CV.  Regular rate and rhythm, no JVD, rub or murmur. Abdomen.  Soft, nontender, nondistended, BS positive. CNS.  Alert and oriented .  No focal neurologic deficit. Extremities.  Left lower leg edema with surrounding erythema and a bandage placed on mid shin. Psychiatry.  Judgment and insight appears normal.     Data Reviewed:  There are no new results to review at this time.  Family Communication: Wife  Time spent: 15 minutes.  Author: Cristela Felt, MD 04/08/2022 11:11 AM  For on call review [email or web address].

## 2022-04-08 NOTE — Telephone Encounter (Signed)
Talked to wife and told her that she can ask if the hospital will do the MRI CS while in the hospital but if they will not he will have to wait until he is out. She is going to ask about it. The hospital has an order for the MRI B that they order already at present.

## 2022-04-09 DIAGNOSIS — A409 Streptococcal sepsis, unspecified: Secondary | ICD-10-CM | POA: Diagnosis not present

## 2022-04-09 LAB — GLUCOSE, CAPILLARY
Glucose-Capillary: 131 mg/dL — ABNORMAL HIGH (ref 70–99)
Glucose-Capillary: 126 mg/dL — ABNORMAL HIGH (ref 70–99)

## 2022-04-09 MED ORDER — BISACODYL 10 MG RE SUPP
10.0000 mg | Freq: Every day | RECTAL | Status: DC
  Administered 2022-04-09 – 2022-04-14 (×5): 10 mg via RECTAL
  Filled 2022-04-09 (×4): qty 1

## 2022-04-09 NOTE — Progress Notes (Signed)
0411: Kristopher Oppenheim DO notified that patient does not have labs ordered for this morning. This RN asked for morning labs to be drawn on the patient due to bacteremia. Chen DO responded with "no".

## 2022-04-09 NOTE — Progress Notes (Signed)
Consultation Progress Note   Patient: Melvin [name] YNW:[id number] DOB: 1945/08/19 DOA: 04/05/2022 DOS: the patient was seen and examined on 04/09/2022 Primary service: Lorella Nimrod, MD  Brief hospital course: Taken from H&P.  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 He fell 3-4 weeks ago sustaining deep abrasion to his left distal LE. He has had a full course of doxycycline and then a second course when the infection recurred. Today, after a neurologic consultation he got home and experienced rigors, profound weakness, developed a significant fever. Due to his inability to ambulate EMS was activated and he was brought to [facility] for evaluation.     ED course.  He was febrile at 100.2, rest of the vitals stable, left lower extremity wound looks infected.  Labs pertinent for leukocytosis with left shift. Chest x-ray with no acute abnormality.  UA negative.  COVID-19 and flu PCR negative. Preliminary blood cultures with Streptococcus agalactiae-pending final results. Antibiotics switched to penicillin G..  ID was consulted Echocardiogram was negative for any vegetations Wound cultures with preliminary results of Staph aureus-final results pending. MRI brain and cervical spine was without any acute abnormality-chronic changes noted.  12/9: Patient was having lower back pain which seems similar to his chronic back pain.  He was blaming the hospital back.  If back pain get worse and we might need to get imaging to rule out any osteomyelitis or discitis.  PT/OT recommended CIR, patient and wife seems interested. Pending admission to CIR, medically stable. He will continue antibiotics as recommended and assessment and plan.   Assessment and Plan: Altered Mental status -Likely due to Delirium in setting of sepsis. CT was concerning for NPH, Discussed with Neurology they had low suspicion  and requested an MRI W,WO for further evaluation Streptococcal sepsis, unspecified ([place]) Patient met sepsis criteria with fever, leukocytosis and tachypnea.  Initial blood pressure was soft which responded well to IV fluid. Blood cultures with strep agalactiae-repeat blood cultures negative Wound cultures growing Staph aureus-final culture results are still pending. Initially received broad-spectrum antibiotics. -Antibiotics switched to penicillin G -ID consulted-recommending continuation of penicillin while in the hospital followed by amoxicillin on discharge to complete a total of 14-day course. -Echocardiogram showed no vegetations  Cellulitis of left lower leg Patient with traumatic abrasion that became infected. He has failed outpatient oral abx with doxycycline. -Management as above  PAD (peripheral artery disease) ([place]) On exam trace DP pulse left foot, slow capillary refill mid foot and toes. Concern that slow healing in part due to PAD.  GERD (gastroesophageal reflux disease) Will continue H2 blocker therapy  Unsteady gait Patient with very recent neuro eval and presumptive diagnosis of Parkinson's disease. MRI and labs ordered by neuro are pending.  Patient developed visual hallucinations after starting Sinemet so it was discontinued. Patient need to follow-up with outpatient neurology for further recommendations. PT/OT are recommending CIR.  DVT prophylaxis.  Lovenox   Subjective: Patient was seen and examined today.  He was complaining of lower back pain which seems similar to his prior chronic back pain.  He was also complaining of no bowel movement since Monday.   Physical Exam: Vitals:   04/08/22 0846 04/08/22 1618 04/08/22 2201 04/09/22 0456  BP: (!) 187/95 (!) 148/73 (!) 123/103 (!) 159/67  Pulse: 66 64 62 (!) 57  Resp: _0 Temp: 98.7 F (37.1 C) 98.9 F (37.2 C) (!)  97.3 F (36.3 C) 98.8 F (37.1 C)  TempSrc: Oral Oral Oral Oral  SpO2: 96% 97%  99% 96%  Weight:      Height:      General.  Frail elderly man, in no acute distress. Pulmonary.  Lungs clear bilaterally, normal respiratory effort. CV.  Regular rate and rhythm, no JVD, rub or murmur. Abdomen.  Soft, nontender, nondistended, BS positive. CNS.  Alert and oriented .  No focal neurologic deficit. Extremities.  No edema, no cyanosis, pulses intact and symmetrical.LLE with a clean bandage just below the knee. Psychiatry.  Judgment and insight appears normal.    Data Reviewed: Prior data reviewed  Family Communication: Discussed with wife at bedside  Time spent: 40 minutes.  This record has been created using Systems analyst. Errors have been sought and corrected,but may not always be located. Such creation errors do not reflect on the standard of care.   Author: Lorella Nimrod, MD 04/09/2022 8:00 AM  For on call review [email or web address].

## 2022-04-10 DIAGNOSIS — A409 Streptococcal sepsis, unspecified: Secondary | ICD-10-CM | POA: Diagnosis not present

## 2022-04-10 LAB — VITAMIN E
Gamma-Tocopherol (Vit E): <1 mg/L (ref <=4.3)
Vitamin E (Alpha Tocopherol): 11.2 mg/L (ref 5.7–19.9)

## 2022-04-10 LAB — VITAMIN B1: Vitamin B1 (Thiamine): 24 nmol/L (ref 8–30)

## 2022-04-10 LAB — COPPER, SERUM: Copper: 101 ug/dL (ref 70–175)

## 2022-04-10 MED ORDER — TRIPLE ANTIBIOTIC 3.5-400-5000 EX OINT
1.0000 | TOPICAL_OINTMENT | Freq: Every day | CUTANEOUS | Status: DC
  Administered 2022-04-10: 1 via CUTANEOUS
  Filled 2022-04-10 (×2): qty 1

## 2022-04-10 NOTE — Progress Notes (Signed)
PROGRESS NOTE    Melvin [name]  XQJ:[id number] DOB: 1945/07/26 DOA: 04/05/2022 PCP: [name], MD   Brief Narrative:    Taken from H&P.   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 He fell 3-4 weeks ago sustaining deep abrasion to his left distal LE. He has had a full course of doxycycline and then a second course when the infection recurred. Today, after a neurologic consultation he got home and experienced rigors, profound weakness, developed a significant fever. Due to his inability to ambulate EMS was activated and he was brought to [facility] for evaluation.      ED course.  He was febrile at 100.2, rest of the vitals stable, left lower extremity wound looks infected.  Labs pertinent for leukocytosis with left shift. Chest x-ray with no acute abnormality.  UA negative.  COVID-19 and flu PCR negative. Preliminary blood cultures with Streptococcus agalactiae-pending final results. Antibiotics switched to penicillin G..  ID was consulted Echocardiogram was negative for any vegetations Wound cultures with preliminary results of Staph aureus-final results pending. MRI brain and cervical spine was without any acute abnormality-chronic changes noted.  PT/OT recommended CIR, patient and wife seems interested. Pending admission to CIR, medically stable. He will continue antibiotics as recommended and assessment and plan.  Assessment & Plan:   Principal Problem:   Streptococcal sepsis, unspecified ([place]) Active Problems:   Cellulitis of left lower leg   PAD (peripheral artery disease) (HCC)   GERD (gastroesophageal reflux disease)   Unsteady gait  Assessment and Plan:   Altered Mental status -Likely due to Delirium in setting of sepsis. CT was concerning for NPH, Discussed with Neurology they had low suspicion and requested an MRI W,WO for further evaluation Streptococcal  sepsis, unspecified ([place]) Patient met sepsis criteria with fever, leukocytosis and tachypnea.  Initial blood pressure was soft which responded well to IV fluid. Blood cultures with strep agalactiae-repeat blood cultures negative Wound cultures growing Staph aureus-final culture results are still pending. Initially received broad-spectrum antibiotics. -Antibiotics switched to penicillin G -ID consulted-recommending continuation of penicillin while in the hospital followed by amoxicillin on discharge to complete a total of 14-day course. -Echocardiogram showed no vegetations   Cellulitis of left lower leg Patient with traumatic abrasion that became infected. He has failed outpatient oral abx with doxycycline. -Management as above   PAD (peripheral artery disease) ([place]) On exam trace DP pulse left foot, slow capillary refill mid foot and toes. Concern that slow healing in part due to PAD.   GERD (gastroesophageal reflux disease) Will continue H2 blocker therapy   Unsteady gait Patient with very recent neuro eval and presumptive diagnosis of Parkinson's disease. MRI and labs ordered by neuro are pending.  Patient developed visual hallucinations after starting Sinemet so it was discontinued. Patient need to follow-up with outpatient neurology for further recommendations. PT/OT are recommending CIR.   DVT prophylaxis: Lovenox Code Status: Full Family Communication: None at bedside Disposition Plan:  Status is: Inpatient Remains inpatient appropriate because: Need for IV medications.   Consultants:  None  Procedures:  None  Antimicrobials:  Anti-infectives (From admission, onward)    Start     Dose/Rate Route Frequency Ordered Stop   04/06/22 2200  cefTRIAXone (ROCEPHIN) 2 g in sodium chloride 0.9 % 100 mL IVPB  Status:  Discontinued        2 g 200 mL/hr over 30 Minutes Intravenous  Every 24 hours 04/05/22 2047 04/06/22 0811   04/06/22 2200  vancomycin (VANCOREADY) IVPB 1250  mg/250 mL  Status:  Discontinued        1,250 mg 166.7 mL/hr over 90 Minutes Intravenous Every 24 hours 04/05/22 2123 04/06/22 0811   04/06/22 1630  penicillin G potassium 12 Million Units in dextrose 5 % 500 mL continuous infusion        12 Million Units 41.7 mL/hr over 12 Hours Intravenous Every 12 hours 04/06/22 1544     04/06/22 0900  penicillin G potassium 4 Million Units in dextrose 5 % 250 mL IVPB  Status:  Discontinued        4 Million Units 250 mL/hr over 60 Minutes Intravenous Every 4 hours 04/06/22 0811 04/06/22 1544   04/06/22 0000  cefTRIAXone (ROCEPHIN) 1 g in sodium chloride 0.9 % 100 mL IVPB  Status:  Discontinued        1 g 200 mL/hr over 30 Minutes Intravenous Every 24 hours 04/05/22 2031 04/05/22 2047   04/06/22 0000  vancomycin (VANCOCIN) IVPB 1000 mg/200 mL premix  Status:  Discontinued        1,000 mg 200 mL/hr over 60 Minutes Intravenous Every 24 hours 04/05/22 2031 04/05/22 2056   04/05/22 2200  vancomycin (VANCOREADY) IVPB 1750 mg/350 mL        1,750 mg 175 mL/hr over 120 Minutes Intravenous  Once 04/05/22 2057 04/06/22 0133   04/05/22 2015  vancomycin (VANCOCIN) IVPB 1000 mg/200 mL premix  Status:  Discontinued        1,000 mg 200 mL/hr over 60 Minutes Intravenous  Once 04/05/22 2000 04/05/22 2057   04/05/22 2015  cefTRIAXone (ROCEPHIN) 1 g in sodium chloride 0.9 % 100 mL IVPB  Status:  Discontinued        1 g 200 mL/hr over 30 Minutes Intravenous  Once 04/05/22 2000 04/05/22 2047      Subjective: Patient seen and evaluated today with no new acute complaints or concerns. No acute concerns or events noted overnight.  He continues to have low back pain, but this is a chronic issue for him.  Objective: Vitals:   04/09/22 0902 04/09/22 2205 04/10/22 0417 04/10/22 0450  BP: 130/68 (!) 145/71 (!) 169/72 (!) 154/88  Pulse: 67 60 60 (!) 57  Resp: _0 Temp: 97.9 F (36.6 C) 98.3 F (36.8 C) 98.3 F (36.8 C) 98.1 F (36.7 C)  TempSrc: Oral Oral Oral    SpO2: 95% 97% 95% 98%  Weight:      Height:        Intake/Output Summary (Last 24 hours) at 04/10/2022 1311 Last data filed at 04/10/2022 0900 Gross per 24 hour  Intake 714.68 ml  Output 1475 ml  Net -760.32 ml   Filed Weights   04/05/22 1506  Weight: 78.8 kg    Examination:  General exam: Appears calm and comfortable  Respiratory system: Clear to auscultation. Respiratory effort normal. Cardiovascular system: S1 & S2 heard, RRR.  Gastrointestinal system: Abdomen is soft Central nervous system: Alert and awake Extremities: No edema Skin: No significant lesions noted Psychiatry: Flat affect.    Data Reviewed: I have personally reviewed following labs and imaging studies  CBC: Recent Labs  Lab 04/05/22 1545 04/07/22 0501 04/07/22 1727 04/08/22 0437  WBC 11.7* 11.9* 11.9* 10.2  NEUTROABS 10.9*  --   --  8.1*  HGB 13.3 13.9 12.8* 12.6*  HCT 37.6* 39.8 37.3* 36.5*  MCV 96.2 96.4 95.2 95.3  PLT 164 169 153 010*   Basic Metabolic Panel: Recent Labs  Lab 04/05/22 2012 04/07/22 1727 04/08/22 0437  NA 137 133* 133*  K 3.9 3.7 3.7  CL 107 101 99  CO2 _0 GLUCOSE 158* 106* 117*  BUN _1 CREATININE 1.12 0.92 0.90  CALCIUM 8.4* 8.1* 8.3*   GFR: Estimated Creatinine Clearance: 76.6 mL/min (by C-G formula based on SCr of 0.9 mg/dL). Liver Function Tests: Recent Labs  Lab 04/05/22 2012 04/08/22 0437  AST 33 30  ALT 21 33  ALKPHOS 45 57  BILITOT 1.2 0.6  PROT 5.0* 5.3*  ALBUMIN 2.9* 2.6*   No results for input(s): "LIPASE", "AMYLASE" in the last 168 hours. Recent Labs  Lab 04/07/22 1727  AMMONIA 47*   Coagulation Profile: No results for input(s): "INR", "PROTIME" in the last 168 hours. Cardiac Enzymes: No results for input(s): "CKTOTAL", "CKMB", "CKMBINDEX", "TROPONINI" in the last 168 hours. BNP (last 3 results) No results for input(s): "PROBNP" in the last 8760 hours. HbA1C: No results for input(s): "HGBA1C" in the last 72  hours. CBG: Recent Labs  Lab 04/06/22 1806 04/08/22 0843 04/08/22 1204 04/09/22 0903 04/09/22 1237  GLUCAP 121* 113* 107* 131* 126*   Lipid Profile: No results for input(s): "CHOL", "HDL", "LDLCALC", "TRIG", "CHOLHDL", "LDLDIRECT" in the last 72 hours. Thyroid Function Tests: No results for input(s): "TSH", "T4TOTAL", "FREET4", "T3FREE", "THYROIDAB" in the last 72 hours. Anemia Panel: No results for input(s): "VITAMINB12", "FOLATE", "FERRITIN", "TIBC", "IRON", "RETICCTPCT" in the last 72 hours. Sepsis Labs: Recent Labs  Lab 04/05/22 1545 04/05/22 1718  LATICACIDVEN 1.4 1.0    Recent Results (from the past 240 hour(s))  Resp Panel by RT-PCR (Flu A&B, Covid)     Status: None   Collection Time: 04/05/22  3:23 PM   Specimen: Nasal Swab  Result Value Ref Range Status   SARS Coronavirus 2 by RT PCR NEGATIVE NEGATIVE Final    Comment: (NOTE) SARS-CoV-2 target nucleic acids are NOT DETECTED.  The SARS-CoV-2 RNA is generally detectable in upper respiratory specimens during the acute phase of infection. The lowest concentration of SARS-CoV-2 viral copies this assay can detect is 138 copies/mL. A negative result does not preclude SARS-Cov-2 infection and should not be used as the sole basis for treatment or other patient management decisions. A negative result may occur with  improper specimen collection/handling, submission of specimen other than nasopharyngeal swab, presence of viral mutation(s) within the areas targeted by this assay, and inadequate number of viral copies(<138 copies/mL). A negative result must be combined with clinical observations, patient history, and epidemiological information. The expected result is Negative.  Fact Sheet for Patients:  EntrepreneurPulse.com.au  Fact Sheet for Healthcare Providers:  IncredibleEmployment.be  This test is no t yet approved or cleared by the [place] FDA and  has been authorized  for detection and/or diagnosis of SARS-CoV-2 by FDA under an Emergency Use Authorization (EUA). This EUA will remain  in effect (meaning this test can be used) for the duration of the COVID-19 declaration under Section 564(b)(1) of the Act, 21 U.S.C.section 360bbb-3(b)(1), unless the authorization is terminated  or revoked sooner.       Influenza A by PCR NEGATIVE NEGATIVE Final   Influenza B by PCR NEGATIVE NEGATIVE Final    Comment: (NOTE) The Xpert Xpress SARS-CoV-2/FLU/RSV plus assay is intended as an aid in the diagnosis of influenza from Nasopharyngeal swab specimens and should not be used as a sole basis for treatment.  Nasal washings and aspirates are unacceptable for Xpert Xpress SARS-CoV-2/FLU/RSV testing.  Fact Sheet for Patients: EntrepreneurPulse.com.au  Fact Sheet for Healthcare Providers: IncredibleEmployment.be  This test is not yet approved or cleared by the [place] FDA and has been authorized for detection and/or diagnosis of SARS-CoV-2 by FDA under an Emergency Use Authorization (EUA). This EUA will remain in effect (meaning this test can be used) for the duration of the COVID-19 declaration under Section 564(b)(1) of the Act, 21 U.S.C. section 360bbb-3(b)(1), unless the authorization is terminated or revoked.  Performed at [facility], [place] [address], [place], [place] [address]   Blood Culture (routine single)     Status: Abnormal   Collection Time: 04/05/22  3:45 PM   Specimen: BLOOD  Result Value Ref Range Status   Specimen Description BLOOD SITE NOT SPECIFIED  Final   Special Requests   Final    BOTTLES DRAWN AEROBIC AND ANAEROBIC Blood Culture adequate volume   Culture  Setup Time   Final    GRAM POSITIVE COCCI IN PAIRS IN CHAINS IN BOTH AEROBIC AND ANAEROBIC BOTTLES CRITICAL RESULT CALLED TO, READ BACK BY AND VERIFIED WITH: Barbaraann Boys RN 04/06/22 @ 0643 BY AB Performed at [facility],  [place] [address] [place], [place] [address]    Culture GROUP B STREP(S.AGALACTIAE)ISOLATED (A)  Final   Report Status 04/08/2022 FINAL  Final   Organism ID, Bacteria GROUP B STREP(S.AGALACTIAE)ISOLATED  Final      Susceptibility   Group b strep(s.agalactiae)isolated - MIC*    CLINDAMYCIN <=0.25 SENSITIVE Sensitive     AMPICILLIN <=0.25 SENSITIVE Sensitive     ERYTHROMYCIN <=0.12 SENSITIVE Sensitive     VANCOMYCIN 0.5 SENSITIVE Sensitive     CEFTRIAXONE <=0.12 SENSITIVE Sensitive     LEVOFLOXACIN 1 SENSITIVE Sensitive     PENICILLIN <=0.06 SENSITIVE Sensitive     * GROUP B STREP(S.AGALACTIAE)ISOLATED  Blood Culture ID Panel (Reflexed)     Status: Abnormal   Collection Time: 04/05/22  3:45 PM  Result Value Ref Range Status   Enterococcus faecalis NOT DETECTED NOT DETECTED Final   Enterococcus Faecium NOT DETECTED NOT DETECTED Final   Listeria monocytogenes NOT DETECTED NOT DETECTED Final   Staphylococcus species NOT DETECTED NOT DETECTED Final   Staphylococcus aureus (BCID) NOT DETECTED NOT DETECTED Final   Staphylococcus epidermidis NOT DETECTED NOT DETECTED Final   Staphylococcus lugdunensis NOT DETECTED NOT DETECTED Final   Streptococcus species DETECTED (A) NOT DETECTED Final    Comment: CRITICAL RESULT CALLED TO, READ BACK BY AND VERIFIED WITH: Barbaraann Boys RN 04/06/22 @ [phone] BY AB    Streptococcus agalactiae DETECTED (A) NOT DETECTED Final    Comment: CRITICAL RESULT CALLED TO, READ BACK BY AND VERIFIED WITH: Barbaraann Boys RN 04/06/22 @ 0947 BY AB    Streptococcus pneumoniae NOT DETECTED NOT DETECTED Final   Streptococcus pyogenes NOT DETECTED NOT DETECTED Final   A.calcoaceticus-baumannii NOT DETECTED NOT DETECTED Final   Bacteroides fragilis NOT DETECTED NOT DETECTED Final   Enterobacterales NOT DETECTED NOT DETECTED Final   Enterobacter cloacae complex NOT DETECTED NOT DETECTED Final   Escherichia coli NOT DETECTED NOT DETECTED Final   Klebsiella aerogenes NOT DETECTED NOT  DETECTED Final   Klebsiella oxytoca NOT DETECTED NOT DETECTED Final   Klebsiella pneumoniae NOT DETECTED NOT DETECTED Final   Proteus species NOT DETECTED NOT DETECTED Final   Salmonella species NOT DETECTED NOT DETECTED Final   Serratia marcescens NOT DETECTED NOT DETECTED Final   Haemophilus  influenzae NOT DETECTED NOT DETECTED Final   Neisseria meningitidis NOT DETECTED NOT DETECTED Final   Pseudomonas aeruginosa NOT DETECTED NOT DETECTED Final   Stenotrophomonas maltophilia NOT DETECTED NOT DETECTED Final   Candida albicans NOT DETECTED NOT DETECTED Final   Candida auris NOT DETECTED NOT DETECTED Final   Candida glabrata NOT DETECTED NOT DETECTED Final   Candida krusei NOT DETECTED NOT DETECTED Final   Candida parapsilosis NOT DETECTED NOT DETECTED Final   Candida tropicalis NOT DETECTED NOT DETECTED Final   Cryptococcus neoformans/gattii NOT DETECTED NOT DETECTED Final    Comment: Performed at [facility], [place] [address] [place], [place] [address]  Aerobic Culture w Gram Stain (superficial specimen)     Status: None (Preliminary result)   Collection Time: 04/06/22  9:47 AM   Specimen: Wound  Result Value Ref Range Status   Specimen Description WOUND  Final   Special Requests LEFT LEG  Final   Gram Stain   Final    NO WBC SEEN FEW GRAM POSITIVE COCCI IN PAIRS FEW GRAM POSITIVE RODS    Culture   Final    ABUNDANT STAPHYLOCOCCUS AUREUS RARE DIPHTHEROIDS(CORYNEBACTERIUM SPECIES) MODERATE ACINETOBACTER JUNII Standardized susceptibility testing for this organism is not available. DIPHTHEROIDS(CORYNEBACTERIUM SPECIES) SUSCEPTIBILITIES TO FOLLOW Performed at [place] [address], [place], [place] [address]    Report Status PENDING  Incomplete  Culture, blood (Routine X 2) w Reflex to ID Panel     Status: None (Preliminary result)   Collection Time: 04/07/22  1:20 PM   Specimen: BLOOD RIGHT FOREARM  Result Value Ref Range Status   Specimen Description BLOOD  RIGHT FOREARM  Final   Special Requests   Final    BOTTLES DRAWN AEROBIC AND ANAEROBIC Blood Culture results may not be optimal due to an inadequate volume of blood received in culture bottles   Culture   Final    NO GROWTH 3 DAYS Performed at [facility], [place] [address], [place], [place] [address]    Report Status PENDING  Incomplete  Culture, blood (Routine X 2) w Reflex to ID Panel     Status: None (Preliminary result)   Collection Time: 04/07/22  1:29 PM   Specimen: BLOOD RIGHT FOREARM  Result Value Ref Range Status   Specimen Description BLOOD RIGHT FOREARM  Final   Special Requests   Final    BOTTLES DRAWN AEROBIC AND ANAEROBIC Blood Culture results may not be optimal due to an inadequate volume of blood received in culture bottles   Culture   Final    NO GROWTH 3 DAYS Performed at [facility], [place] [address] [place], [place] [address]    Report Status PENDING  Incomplete         Radiology Studies: MR BRAIN WO CONTRAST  Result Date: 04/08/2022 CLINICAL DATA:  Hydrocephalus. EXAM: MRI HEAD WITHOUT CONTRAST TECHNIQUE: Multiplanar, multiecho pulse sequences of the brain and surrounding structures were obtained without intravenous contrast. COMPARISON:  Head CT 04/07/2022 FINDINGS: A coronal T2 sequence is not obtained as the patient was unable to tolerate the complete examination. Brain: There is no evidence of an acute infarct, mass, midline shift, or extra-axial fluid collection. Patchy T2 hyperintensities in the cerebral white matter and pons are nonspecific but compatible with moderate chronic small vessel ischemic disease. An asymmetric 2 cm region of T2 hyperintensity/gliosis is noted in the subcortical white matter of the posterolateral left frontal lobe with some associated curvilinear susceptibility which  may reflect chronic blood products or vessels/possible developmental venous anomaly. 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  Lateral ventriculomegaly is again noted, including prominent enlargement of the left greater than right atria and occipital horns with surrounding white matter volume loss. The temporal horns are only mildly enlarged. The third ventricle and cerebral sulci are mildly enlarged. Vascular: Major intracranial vascular flow voids are preserved. Skull and upper cervical spine: Unremarkable bone marrow signal para Sinuses/Orbits: Bilateral cataract extraction. Clear paranasal sinuses. Small right mastoid effusion. Other: None. IMPRESSION: 1. No acute intracranial abnormality. 2. Moderate chronic small vessel ischemic disease. 3. Ventriculomegaly which is favored to reflect advanced central predominant cerebral atrophy rather than hydrocephalus. Electronically Signed   By: Logan Bores M.D.   On: 04/08/2022 14:57   MR CERVICAL SPINE WO CONTRAST  Result Date: 04/08/2022 CLINICAL DATA:  Ataxia, nontraumatic.  Cervical pathology suspected. EXAM: MRI CERVICAL SPINE WITHOUT CONTRAST TECHNIQUE: Multiplanar, multisequence MR imaging of the cervical spine was performed. No intravenous contrast was administered. COMPARISON:  None Available. FINDINGS: Despite efforts by the technologist and patient, mild motion artifact is present on today's exam and could not be eliminated. This reduces exam sensitivity and specificity. Alignment: Straightening with a slight retrolisthesis at the C5-6 and C6-7 levels. Vertebrae: No acute or suspicious osseous findings. Mild endplate degenerative changes at C5-6 and C6-7. Cord: Normal in signal and caliber. Posterior Fossa, vertebral arteries, paraspinal tissues: Intracranial findings are dictated separately. The visualized posterior fossa appears unremarkable.Bilateral vertebral artery flow voids. No significant paraspinal findings. Disc levels: Details of the individual disc space levels are partly obscured by motion artifact. C2-3: Normal interspace. C3-4: Asymmetric uncinate spurring and  facet hypertrophy on the right contributing to mild to moderate right foraminal narrowing. No cord deformity. The left foramen appears widely patent. C4-5: Preserved disc height. Mild facet hypertrophy, right greater than left. No spinal stenosis or nerve root encroachment. C5-6: Chronic loss of disc height with posterior osteophytes and bilateral uncinate spurring. The CSF surrounding the cord is partially effaced without cord deformity. Moderate foraminal narrowing bilaterally which appears chronic. C6-7: Chronic loss of disc height with posterior osteophytes and bilateral uncinate spurring. Mild right and moderate to severe left foraminal narrowing which appears chronic. No cord deformity. C7-T1: Normal interspace. IMPRESSION: 1. No acute findings or clear explanation for the patient's symptoms. 2. 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 3. Mild to moderate right foraminal narrowing at C3-4 due to uncinate spurring and facet hypertrophy. 4. No cord deformity or abnormal cord signal. Electronically Signed   By: Richardean Sale M.D.   On: 04/08/2022 14:57        Scheduled Meds:  bisacodyl  10 mg Rectal Q0600   enoxaparin (LOVENOX) injection  40 mg Subcutaneous Q24H   famotidine  20 mg Oral BID   neomycin-bacitracin-polymyxin  1 Application Apply externally Daily   Continuous Infusions:  penicillin G potassium 12 Million Units in dextrose 5 % 500 mL continuous infusion 12 Million Units (04/10/22 0816)     LOS: 5 days    Time spent: 35 minutes    Shaquel Chavous Darleen Crocker, DO [facility]  If 7PM-7AM, please contact night-coverage [email or web address] 04/10/2022, 1:11 PM

## 2022-04-11 ENCOUNTER — Encounter (HOSPITAL_COMMUNITY): Payer: Self-pay | Admitting: Internal Medicine

## 2022-04-11 DIAGNOSIS — A409 Streptococcal sepsis, unspecified: Secondary | ICD-10-CM | POA: Diagnosis not present

## 2022-04-11 LAB — CBC
HCT: 34.4 % — ABNORMAL LOW (ref 39.0–52.0)
Hemoglobin: 12.1 g/dL — ABNORMAL LOW (ref 13.0–17.0)
MCH: 33 pg (ref 26.0–34.0)
MCHC: 35.2 g/dL (ref 30.0–36.0)
MCV: 93.7 fL (ref 80.0–100.0)
Platelets: 232 10*3/uL (ref 150–400)
RBC: 3.67 MIL/uL — ABNORMAL LOW (ref 4.22–5.81)
RDW: 12.7 % (ref 11.5–15.5)
WBC: 10.1 10*3/uL (ref 4.0–10.5)
nRBC: 0 % (ref 0.0–0.2)

## 2022-04-11 LAB — BASIC METABOLIC PANEL
Anion gap: 9 (ref 5–15)
BUN: 21 mg/dL (ref 8–23)
CO2: 22 mmol/L (ref 22–32)
Calcium: 8.2 mg/dL — ABNORMAL LOW (ref 8.9–10.3)
Chloride: 102 mmol/L (ref 98–111)
Creatinine, Ser: 0.84 mg/dL (ref 0.61–1.24)
GFR, Estimated: >60 mL/min (ref >60)
Glucose, Bld: 120 mg/dL — ABNORMAL HIGH (ref 70–99)
Potassium: 4.3 mmol/L (ref 3.5–5.1)
Sodium: 133 mmol/L — ABNORMAL LOW (ref 135–145)

## 2022-04-11 LAB — GLUCOSE, CAPILLARY: Glucose-Capillary: 115 mg/dL — ABNORMAL HIGH (ref 70–99)

## 2022-04-11 LAB — MAGNESIUM: Magnesium: 1.9 mg/dL (ref 1.7–2.4)

## 2022-04-11 MED ORDER — BACITRACIN-NEOMYCIN-POLYMYXIN OINTMENT TUBE
TOPICAL_OINTMENT | Freq: Every day | CUTANEOUS | Status: DC
  Filled 2022-04-11: qty 14

## 2022-04-11 MED ORDER — KETOROLAC TROMETHAMINE 15 MG/ML IJ SOLN
15.0000 mg | Freq: Four times a day (QID) | INTRAMUSCULAR | Status: DC | PRN
  Administered 2022-04-11: 15 mg via INTRAVENOUS
  Filled 2022-04-11: qty 1

## 2022-04-11 NOTE — Progress Notes (Signed)
SLP Cancellation Note  Patient Details Name: Melvin [name] MRN: [id number] DOB: 10-Aug-1945   Cancelled treatment:       Reason Eval/Treat Not Completed: Other (comment) pt working with OT. Will f/u as able.    Osie Bond., M.A. [place] Office [phone]  Secure chat preferred  04/11/2022, 12:04 PM

## 2022-04-11 NOTE — Progress Notes (Signed)
Inpatient Rehabilitation Admissions Coordinator   I met at bedside with patient and wife. We further discussed the need to determine if she feels able to provide physical caregiver supports after a CIR admit of approximately 2 weeks vs possible need for longer rehab recovery at  SNF. She requests to speak to SW concerning SNF options as well as await to see his progress with therapies today. I have alerted acute team and TOC and will follow up.  Danne Baxter, RN, MSN Rehab Admissions Coordinator [phone] 04/11/2022 11:07 AM

## 2022-04-11 NOTE — Care Management Important Message (Signed)
Important Message  Patient Details  Name: Melvin [name] MRN: [id number] Date of Birth: 1946/02/27   Medicare Important Message Given:  Yes     Orbie Pyo 04/11/2022, 3:34 PM

## 2022-04-11 NOTE — Progress Notes (Signed)
[facility] for Infectious Disease   Reason for visit: Follow up on bacteremia  Interval History: remains afebrile, WBC 10.1.  Blood cultures remain ngtd.  Wife at bedside.   Day 7 Total antibiotics  Physical Exam: Constitutional:  Vitals:   04/11/22 0629 04/11/22 0736  BP: (!) 151/63 (!) 149/66  Pulse: (!) 57 (!) 56  Resp: 16 15  Temp: 97.8 F (36.6 C) 97.7 F (36.5 C)  SpO2: 99% 97%   patient appears in NAD Respiratory: Normal respiratory effort; CTA B   Review of Systems: Constitutional: negative for fevers and chills  Lab Results  Component Value Date   WBC 10.1 04/11/2022   HGB 12.1 (L) 04/11/2022   HCT 34.4 (L) 04/11/2022   MCV 93.7 04/11/2022   PLT 232 04/11/2022    Lab Results  Component Value Date   CREATININE 0.84 04/11/2022   BUN 21 04/11/2022   NA 133 (L) 04/11/2022   K 4.3 04/11/2022   CL 102 04/11/2022   CO2 22 04/11/2022    Lab Results  Component Value Date   ALT 33 04/08/2022   AST 30 04/08/2022   ALKPHOS 57 04/08/2022     Microbiology: Recent Results (from the past 240 hour(s))  Resp Panel by RT-PCR (Flu A&B, Covid)     Status: None   Collection Time: 04/05/22  3:23 PM   Specimen: Nasal Swab  Result Value Ref Range Status   SARS Coronavirus 2 by RT PCR NEGATIVE NEGATIVE Final    Comment: (NOTE) SARS-CoV-2 target nucleic acids are NOT DETECTED.  The SARS-CoV-2 RNA is generally detectable in upper respiratory specimens during the acute phase of infection. The lowest concentration of SARS-CoV-2 viral copies this assay can detect is 138 copies/mL. A negative result does not preclude SARS-Cov-2 infection and should not be used as the sole basis for treatment or other patient management decisions. A negative result may occur with  improper specimen collection/handling, submission of specimen other than nasopharyngeal swab, presence of viral mutation(s) within the areas targeted by this assay, and inadequate number of  viral copies(<138 copies/mL). A negative result must be combined with clinical observations, patient history, and epidemiological information. The expected result is Negative.  Fact Sheet for Patients:  EntrepreneurPulse.com.au  Fact Sheet for Healthcare Providers:  IncredibleEmployment.be  This test is no t yet approved or cleared by the [place] FDA and  has been authorized for detection and/or diagnosis of SARS-CoV-2 by FDA under an Emergency Use Authorization (EUA). This EUA will remain  in effect (meaning this test can be used) for the duration of the COVID-19 declaration under Section 564(b)(1) of the Act, 21 U.S.C.section 360bbb-3(b)(1), unless the authorization is terminated  or revoked sooner.       Influenza A by PCR NEGATIVE NEGATIVE Final   Influenza B by PCR NEGATIVE NEGATIVE Final    Comment: (NOTE) The Xpert Xpress SARS-CoV-2/FLU/RSV plus assay is intended as an aid in the diagnosis of influenza from Nasopharyngeal swab specimens and should not be used as a sole basis for treatment. Nasal washings and aspirates are unacceptable for Xpert Xpress SARS-CoV-2/FLU/RSV testing.  Fact Sheet for Patients: EntrepreneurPulse.com.au  Fact Sheet for Healthcare Providers: IncredibleEmployment.be  This test is not yet approved or cleared by the [place] FDA and has been authorized for detection and/or diagnosis of SARS-CoV-2 by FDA under an Emergency Use Authorization (EUA). This EUA will remain in effect (meaning this test can be used) for the duration of the COVID-19 declaration under  Section 564(b)(1) of the Act, 21 U.S.C. section 360bbb-3(b)(1), unless the authorization is terminated or revoked.  Performed at [facility], [place] [address] [place], [place] [address]   Blood Culture (routine single)     Status: Abnormal   Collection Time: 04/05/22  3:45 PM   Specimen: BLOOD   Result Value Ref Range Status   Specimen Description BLOOD SITE NOT SPECIFIED  Final   Special Requests   Final    BOTTLES DRAWN AEROBIC AND ANAEROBIC Blood Culture adequate volume   Culture  Setup Time   Final    GRAM POSITIVE COCCI IN PAIRS IN CHAINS IN BOTH AEROBIC AND ANAEROBIC BOTTLES CRITICAL RESULT CALLED TO, READ BACK BY AND VERIFIED WITH: Barbaraann Boys RN 04/06/22 @ 0643 BY AB Performed at [facility], [place] [address], [place], [place] [address]    Culture GROUP B STREP(S.AGALACTIAE)ISOLATED (A)  Final   Report Status 04/08/2022 FINAL  Final   Organism ID, Bacteria GROUP B STREP(S.AGALACTIAE)ISOLATED  Final      Susceptibility   Group b strep(s.agalactiae)isolated - MIC*    CLINDAMYCIN <=0.25 SENSITIVE Sensitive     AMPICILLIN <=0.25 SENSITIVE Sensitive     ERYTHROMYCIN <=0.12 SENSITIVE Sensitive     VANCOMYCIN 0.5 SENSITIVE Sensitive     CEFTRIAXONE <=0.12 SENSITIVE Sensitive     LEVOFLOXACIN 1 SENSITIVE Sensitive     PENICILLIN <=0.06 SENSITIVE Sensitive     * GROUP B STREP(S.AGALACTIAE)ISOLATED  Blood Culture ID Panel (Reflexed)     Status: Abnormal   Collection Time: 04/05/22  3:45 PM  Result Value Ref Range Status   Enterococcus faecalis NOT DETECTED NOT DETECTED Final   Enterococcus Faecium NOT DETECTED NOT DETECTED Final   Listeria monocytogenes NOT DETECTED NOT DETECTED Final   Staphylococcus species NOT DETECTED NOT DETECTED Final   Staphylococcus aureus (BCID) NOT DETECTED NOT DETECTED Final   Staphylococcus epidermidis NOT DETECTED NOT DETECTED Final   Staphylococcus lugdunensis NOT DETECTED NOT DETECTED Final   Streptococcus species DETECTED (A) NOT DETECTED Final    Comment: CRITICAL RESULT CALLED TO, READ BACK BY AND VERIFIED WITH: Barbaraann Boys RN 04/06/22 @ [phone] BY AB    Streptococcus agalactiae DETECTED (A) NOT DETECTED Final    Comment: CRITICAL RESULT CALLED TO, READ BACK BY AND VERIFIED WITH: Barbaraann Boys RN 04/06/22 @ 0643 BY AB     Streptococcus pneumoniae NOT DETECTED NOT DETECTED Final   Streptococcus pyogenes NOT DETECTED NOT DETECTED Final   A.calcoaceticus-baumannii NOT DETECTED NOT DETECTED Final   Bacteroides fragilis NOT DETECTED NOT DETECTED Final   Enterobacterales NOT DETECTED NOT DETECTED Final   Enterobacter cloacae complex NOT DETECTED NOT DETECTED Final   Escherichia coli NOT DETECTED NOT DETECTED Final   Klebsiella aerogenes NOT DETECTED NOT DETECTED Final   Klebsiella oxytoca NOT DETECTED NOT DETECTED Final   Klebsiella pneumoniae NOT DETECTED NOT DETECTED Final   Proteus species NOT DETECTED NOT DETECTED Final   Salmonella species NOT DETECTED NOT DETECTED Final   Serratia marcescens NOT DETECTED NOT DETECTED Final   Haemophilus influenzae NOT DETECTED NOT DETECTED Final   Neisseria meningitidis NOT DETECTED NOT DETECTED Final   Pseudomonas aeruginosa NOT DETECTED NOT DETECTED Final   Stenotrophomonas maltophilia NOT DETECTED NOT DETECTED Final   Candida albicans NOT DETECTED NOT DETECTED Final   Candida auris NOT DETECTED NOT DETECTED Final   Candida glabrata NOT DETECTED NOT DETECTED Final   Candida krusei NOT DETECTED NOT DETECTED Final   Candida parapsilosis NOT DETECTED NOT  DETECTED Final   Candida tropicalis NOT DETECTED NOT DETECTED Final   Cryptococcus neoformans/gattii NOT DETECTED NOT DETECTED Final    Comment: Performed at [facility], [place] [address] [place], [place] [address]  Aerobic Culture w Gram Stain (superficial specimen)     Status: None (Preliminary result)   Collection Time: 04/06/22  9:47 AM   Specimen: Wound  Result Value Ref Range Status   Specimen Description WOUND  Final   Special Requests LEFT LEG  Final   Gram Stain   Final    NO WBC SEEN FEW GRAM POSITIVE COCCI IN PAIRS FEW GRAM POSITIVE RODS    Culture   Final    ABUNDANT STAPHYLOCOCCUS AUREUS RARE CORYNEBACTERIUM AMYCOLATUM Standardized susceptibility testing for this organism is not  available. MODERATE ACINETOBACTER JUNII Sent to [facility] for further susceptibility testing. Performed at [facility], [place] [address], [place], [place] [address]    Report Status PENDING  Incomplete   Organism ID, Bacteria STAPHYLOCOCCUS AUREUS  Final      Susceptibility   Staphylococcus aureus - MIC*    CIPROFLOXACIN <=0.5 SENSITIVE Sensitive     ERYTHROMYCIN >=8 RESISTANT Resistant     GENTAMICIN <=0.5 SENSITIVE Sensitive     OXACILLIN 0.5 SENSITIVE Sensitive     TETRACYCLINE <=1 SENSITIVE Sensitive     VANCOMYCIN <=0.5 SENSITIVE Sensitive     TRIMETH/SULFA <=10 SENSITIVE Sensitive     CLINDAMYCIN <=0.25 SENSITIVE Sensitive     RIFAMPIN <=0.5 SENSITIVE Sensitive     Inducible Clindamycin NEGATIVE Sensitive     * ABUNDANT STAPHYLOCOCCUS AUREUS  Culture, blood (Routine X 2) w Reflex to ID Panel     Status: None (Preliminary result)   Collection Time: 04/07/22  1:20 PM   Specimen: BLOOD RIGHT FOREARM  Result Value Ref Range Status   Specimen Description BLOOD RIGHT FOREARM  Final   Special Requests   Final    BOTTLES DRAWN AEROBIC AND ANAEROBIC Blood Culture results may not be optimal due to an inadequate volume of blood received in culture bottles   Culture   Final    NO GROWTH 4 DAYS Performed at [facility], [place] [address] [place], [place] [address]    Report Status PENDING  Incomplete  Culture, blood (Routine X 2) w Reflex to ID Panel     Status: None (Preliminary result)   Collection Time: 04/07/22  1:29 PM   Specimen: BLOOD RIGHT FOREARM  Result Value Ref Range Status   Specimen Description BLOOD RIGHT FOREARM  Final   Special Requests   Final    BOTTLES DRAWN AEROBIC AND ANAEROBIC Blood Culture results may not be optimal due to an inadequate volume of blood received in culture bottles   Culture   Final    NO GROWTH 4 DAYS Performed at [facility], [place] [address] [place], [place] [address]    Report Status PENDING  Incomplete     Impression/Plan:  1. Bacteremia - group B Strep positive and on penicillin and improving.  With the bacteremia, I recommend 14 days total of antibiotics from negative blood cultures through 12/19.  Can use oral amoxicillin 1000 mg TID  2.  Wound culture - unclear why this was done and to what signfiicance this is.  At this point, he is doing well with #1.  Continue with wound care.   3.  Medication-induced encephalopathy - from carbidopa/levadopa and now resolved off the medication.  He should discuss with his  outpatient neurologist after this illness if he should restart or not.    I will sign off, call with questions.

## 2022-04-11 NOTE — Progress Notes (Signed)
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 He fell 3-4 weeks ago sustaining deep abrasion to his left distal LE. He has had a full course of doxycycline and then a second course when the infection recurred. Today, after a neurologic consultation he got home and experienced rigors, profound weakness, developed a significant fever. Due to his inability to ambulate EMS was activated and he was brought to [facility] for evaluation.      ED course.  He was febrile at 100.2, rest of the vitals stable, left lower extremity wound looks infected.  Labs pertinent for leukocytosis with left shift. Chest x-ray with no acute abnormality.  UA negative.  COVID-19 and flu PCR negative. Preliminary blood cultures with Streptococcus agalactiae-pending final results. Antibiotics switched to penicillin G..  ID was consulted Echocardiogram was negative for any vegetations Wound cultures with preliminary results of Staph aureus-final results pending. MRI brain and cervical spine was without any acute abnormality-chronic changes noted.   PT/OT recommended CIR, patient and wife seems interested. Pending admission to CIR, medically stable. He will continue antibiotics as recommended and assessment and plan.  Assessment and Plan: Altered Mental status -Likely due to Delirium in setting of sepsis. CT was concerning for NPH, Discussed with Neurology they had low suspicion and requested an MRI W,WO for further evaluation MRI showed no acute pathology.  Streptococcal sepsis, unspecified ([place]) Patient met sepsis criteria with fever, leukocytosis and tachypnea.  Initial blood pressure was soft which responded  well to IV fluid. Blood cultures with strep agalactiae-repeat blood cultures negative Wound cultures growing Staph aureus-final culture results are still pending. Initially received broad-spectrum antibiotics. -Antibiotics switched to penicillin G -ID consulted-recommending continuation of penicillin while in the hospital followed by amoxicillin on discharge to complete a total of 14-day course. -Echocardiogram showed no vegetations   Cellulitis of left lower leg Patient with traumatic abrasion that became infected. He has failed outpatient oral abx with doxycycline. -Management as above   PAD (peripheral artery disease) ([place]) On exam trace DP pulse left foot, slow capillary refill mid foot and toes. Concern that slow healing in part due to PAD.   GERD (gastroesophageal reflux disease) Will continue H2 blocker therapy   Unsteady gait Patient with very recent neuro eval and presumptive diagnosis of Parkinson's disease. MRI and labs ordered by neuro are pending.  Patient developed visual hallucinations after starting Sinemet so it was discontinued. Patient need to follow-up with outpatient neurology for further recommendations. PT/OT are recommending CIR.        TR[facility] will continue to follow the patient.  Subjective: No complaints this morning, he was requesting assistance with eating breakfast  Physical Exam: Vitals:   04/10/22 1614 04/10/22 2049 04/11/22 0629 04/11/22 0736  BP: 134/67 (!) 149/69 (!) 151/63 (!) 149/66  Pulse: (!) 52 (!) 51 (!) 57 (!) 56  Resp: _0 Temp:  99.7 F (37.6 C) 97.8 F (36.6 C) 97.7 F (36.5 C)  TempSrc:  Oral    SpO2: 97% 97% 99% 97%  Weight:      Height:       General exam: Appears calm and comfortable  Respiratory system: Clear to auscultation. Respiratory effort normal. Cardiovascular system: S1 &  S2 heard, RRR.  Gastrointestinal system: Abdomen is soft Central nervous system: Alert and awake Extremities: No edema Skin: No  significant lesions noted Psychiatry: Flat affect. Data Reviewed:  There are no new results to review at this time.  Family Communication: None at bedside  Time spent: 15 minutes.  Author: Cristela Felt, MD 04/11/2022 10:24 AM  For on call review [email or web address].

## 2022-04-11 NOTE — Progress Notes (Signed)
Occupational Therapy Treatment Patient Details Name: Melvin [name] MRN: [id number] DOB: 1945/08/14 Today's Date: 04/11/2022   History of present illness Pt is 76 year old presented to [facility] on  04/05/22 with fever, weakness, and inability to ambulate. Pt with sepsis due to cellulitis of lt lower leg after traumatic abrasion and failed outpt antibiotic treatment. Pt found to have Group B strep. Pt developed visual hallucinations like due to new meds. Pt with unsteady gait and with very recent neuro eval and presumptive diagnosis of  Parkinson's PMH - scoliosis, lumbar stenosis, DJD lumbar spine, prostate CA. PAD   OT comments  Patient received in supine and apprehensive to get OOB due to pain but was able to agree to transfer to recliner. Patient required frequent cueing to get to EOB and assistance for sitting balance. Patient demonstrated unsafe standing from EOB with RW and was face to face transfer to recliner. Patient agreed to grooming standing at sink with patient being reliant on sink and therapist for balance due to posterior leaning. Patient positioned in recliner due to left lateral leaning. Patient would benefit from further OT services to address bed mobility, transfers, and standing balance to increase participation in ADLs. Acute OT to continue to follow.    Recommendations for follow up therapy are one component of a multi-disciplinary discharge planning process, led by the attending physician.  Recommendations may be updated based on patient status, additional functional criteria and insurance authorization.    Follow Up Recommendations  Acute inpatient rehab (3hours/day)     Assistance Recommended at Discharge Intermittent Supervision/Assistance  Patient can return home with the following  Two people to help with walking and/or transfers;Two people to help with bathing/dressing/bathroom;Assistance with cooking/housework;Help with stairs or ramp for entrance;Assist for  transportation;Direct supervision/assist for financial management;Direct supervision/assist for medications management;Assistance with feeding   Equipment Recommendations  Other (comment) (defer to next venue)    Recommendations for Other Services      Precautions / Restrictions Precautions Precautions: Fall Restrictions Weight Bearing Restrictions: No       Mobility Bed Mobility Overal bed mobility: Needs Assistance Bed Mobility: Rolling, Sidelying to Sit Rolling: Mod assist Sidelying to sit: Mod assist       General bed mobility comments: frequent verbal and tactile cues for log rolling technique    Transfers Overall transfer level: Needs assistance Equipment used: Rolling walker (2 wheels), None Transfers: Sit to/from Stand, Bed to chair/wheelchair/BSC Sit to Stand: Mod assist, Max assist Stand pivot transfers: Max assist         General transfer comment: attempted to stand and transfer with RW from EOB with patient demonstrating strong posterior lean and transfer performed with face to face stand pivot transfer for safety with max assist     Balance Overall balance assessment: Needs assistance Sitting-balance support: Bilateral upper extremity supported, Feet supported Sitting balance-Leahy Scale: Poor Sitting balance - Comments: min assist for balance seated on EOB Postural control: Posterior lean, Left lateral lean Standing balance support: Single extremity supported, Bilateral upper extremity supported, During functional activity Standing balance-Leahy Scale: Poor Standing balance comment: stood at sink for grooming tasks with assistance for balance when performing grooming tasks                           ADL either performed or assessed with clinical judgement   ADL Overall ADL's : Needs assistance/impaired     Grooming: Wash/dry hands;Wash/dry face;Oral care;Minimal assistance;Moderate assistance;Standing Grooming Details (indicate cue  type  and reason): stood at sink with mod assist for balance and occasional min assist                               General ADL Comments: focused on standing at sink for self care tasks    Extremity/Trunk Assessment              Vision       Perception     Praxis      Cognition Arousal/Alertness: Awake/alert Behavior During Therapy: WFL for tasks assessed/performed Overall Cognitive Status: Impaired/Different from baseline Area of Impairment: Memory, Following commands, Problem solving                     Memory: Decreased short-term memory Following Commands: Follows one step commands consistently, Follows one step commands with increased time, Follows multi-step commands inconsistently     Problem Solving: Slow processing, Difficulty sequencing, Requires verbal cues General Comments: followed directions with increased time and often tactile cues.        Exercises      Shoulder Instructions       General Comments      Pertinent Vitals/ Pain       Pain Assessment Pain Assessment: Faces Faces Pain Scale: Hurts even more Pain Location: back Pain Descriptors / Indicators: Aching, Discomfort, Grimacing, Guarding Pain Intervention(s): Limited activity within patient's tolerance, Monitored during session, Premedicated before session, Repositioned  Home Living                                          Prior Functioning/Environment              Frequency  Min 2X/week        Progress Toward Goals  OT Goals(current goals can now be found in the care plan section)  Progress towards OT goals: Progressing toward goals  Acute Rehab OT Goals Patient Stated Goal: get better OT Goal Formulation: With patient/family Time For Goal Achievement: 04/22/22 Potential to Achieve Goals: Good ADL Goals Pt Will Perform Grooming: with min assist;standing Pt Will Perform Upper Body Dressing: with set-up;sitting Pt Will Perform Lower  Body Dressing: with min assist;sit to/from stand Pt Will Transfer to Toilet: with mod assist;ambulating;regular height toilet  Plan Discharge plan remains appropriate    Co-evaluation                 AM-PAC OT "6 Clicks" Daily Activity     Outcome Measure   Help from another person eating meals?: A Little Help from another person taking care of personal grooming?: A Lot Help from another person toileting, which includes using toliet, bedpan, or urinal?: A Lot Help from another person bathing (including washing, rinsing, drying)?: A Lot Help from another person to put on and taking off regular upper body clothing?: A Lot Help from another person to put on and taking off regular lower body clothing?: A Lot 6 Click Score: 13    End of Session Equipment Utilized During Treatment: Gait belt;Rolling walker (2 wheels)  OT Visit Diagnosis: Unsteadiness on feet (R26.81);Other abnormalities of gait and mobility (R26.89);Muscle weakness (generalized) (M62.81);Other symptoms and signs involving cognitive function;Pain   Activity Tolerance Patient limited by pain   Patient Left in chair;with call bell/phone within reach;with family/visitor present   Nurse Communication Mobility status  Time: 7322-0254 OT Time Calculation (min): 40 min  Charges: OT General Charges $OT Visit: 1 Visit OT Treatments $Self Care/Home Management : 8-22 mins $Therapeutic Activity: 23-37 mins  Lodema Hong, OTA Acute Rehabilitation Services  Office [phone]   Trixie Dredge 04/11/2022, 2:04 PM

## 2022-04-11 NOTE — Progress Notes (Signed)
Physical Therapy Treatment Patient Details Name: Melvin [name] MRN: [id number] DOB: 14-Nov-1945 Today's Date: 04/11/2022   History of Present Illness Pt is 76 year old presented to [facility] on  04/05/22 with fever, weakness, and inability to ambulate. Pt with sepsis due to cellulitis of lt lower leg after traumatic abrasion and failed outpt antibiotic treatment. Pt found to have Group B strep. Pt developed visual hallucinations like due to new meds. Pt with unsteady gait and with very recent neuro eval and presumptive diagnosis of  Parkinson's PMH - scoliosis, lumbar stenosis, DJD lumbar spine, prostate CA. PAD    PT Comments    Patient able to progress to in room ambulation today, but very anxious and perseverating on needing to get back up to chair though he was leaning over in chair initially. Wife was not present and not reachable by phone.  Patient may continue to need physical assistance even after AIR.  If wife unable to assist may need to reconsider post-acute rehab options.  PT will continue to follow.    Recommendations for follow up therapy are one component of a multi-disciplinary discharge planning process, led by the attending physician.  Recommendations may be updated based on patient status, additional functional criteria and insurance authorization.  Follow Up Recommendations  Acute inpatient rehab (3hours/day) Can patient physically be transported by private vehicle: No   Assistance Recommended at Discharge Frequent or constant Supervision/Assistance  Patient can return home with the following Two people to help with walking and/or transfers;A lot of help with bathing/dressing/bathroom;Assistance with cooking/housework;Assist for transportation;Help with stairs or ramp for entrance   Equipment Recommendations  Other (comment) (TBA)    Recommendations for Other Services       Precautions / Restrictions Precautions Precautions: Fall Restrictions Weight Bearing Restrictions: No      Mobility  Bed Mobility Overal bed mobility: Needs Assistance Bed Mobility: Sit to Sidelying         Sit to sidelying: Mod assist, HOB elevated General bed mobility comments: max cues for technique, assist for legs onto bed and repositioning trunk    Transfers Overall transfer level: Needs assistance Equipment used: Rolling walker (2 wheels) Transfers: Sit to/from Stand Sit to Stand: Mod assist, Max assist, +2 safety/equipment           General transfer comment: lifting help with OT present to assist initially as well for safety    Ambulation/Gait Ambulation/Gait assistance: Mod assist, Max assist, +2 safety/equipment Gait Distance (Feet): 20 Feet (x 2) Assistive device: Rolling walker (2 wheels) Gait Pattern/deviations: Step-to pattern, Trunk flexed, Wide base of support, Knee flexed in stance - left, Knee flexed in stance - right, Leaning posteriorly       General Gait Details: heavy support due to posterior lean, assist to keep pushing walker forward, though pt not using walker effectively and knees flexed throughout and pt with significant posterior pelvic tilt and limited hip flexion; mod to max A for balance, walker progression, walker proximity and chair follow for safety   Stairs             Wheelchair Mobility    Modified Rankin (Stroke Patients Only)       Balance Overall balance assessment: Needs assistance Sitting-balance support: Bilateral upper extremity supported Sitting balance-Leahy Scale: Poor Sitting balance - Comments: min assist for balance seated on EOB Postural control: Posterior lean Standing balance support: Bilateral upper extremity supported, During functional activity Standing balance-Leahy Scale: Poor Standing balance comment: UE support and min to mod A  for balance                            Cognition Arousal/Alertness: Awake/alert Behavior During Therapy: WFL for tasks assessed/performed Overall Cognitive  Status: Impaired/Different from baseline Area of Impairment: Memory, Following commands, Problem solving                     Memory: Decreased short-term memory Following Commands: Follows one step commands consistently, Follows one step commands with increased time, Follows multi-step commands inconsistently     Problem Solving: Slow processing, Difficulty sequencing, Requires verbal cues General Comments: perseverating on getting all things set up the where he can reach them. wanted shoes left on so he could get back up, but needed redirection he would need staff to assist him back up        Exercises      General Comments General comments (skin integrity, edema, etc.): wife not present, attempted to contact and went to voicemail, RN reported wife had to go to work      Pertinent Vitals/Pain Pain Assessment Faces Pain Scale: Hurts even more Pain Location: back Pain Descriptors / Indicators: Aching, Discomfort, Grimacing, Guarding Pain Intervention(s): Monitored during session, Repositioned, Limited activity within patient's tolerance    Home Living                          Prior Function            PT Goals (current goals can now be found in the care plan section) Progress towards PT goals: Progressing toward goals    Frequency    Min 3X/week      PT Plan Current plan remains appropriate    Co-evaluation              AM-PAC PT "6 Clicks" Mobility   Outcome Measure  Help needed turning from your back to your side while in a flat bed without using bedrails?: A Lot Help needed moving from lying on your back to sitting on the side of a flat bed without using bedrails?: A Lot Help needed moving to and from a bed to a chair (including a wheelchair)?: Total Help needed standing up from a chair using your arms (e.g., wheelchair or bedside chair)?: Total Help needed to walk in hospital room?: Total Help needed climbing 3-5 steps with a  railing? : Total 6 Click Score: 8    End of Session Equipment Utilized During Treatment: Gait belt Activity Tolerance: Patient limited by fatigue Patient left: in bed;with call bell/phone within reach;with nursing/sitter in room   PT Visit Diagnosis: Unsteadiness on feet (R26.81);Muscle weakness (generalized) (M62.81);Pain Pain - part of body:  (back)     Time: 2637-8588 PT Time Calculation (min) (ACUTE ONLY): 28 min  Charges:  $Gait Training: 8-22 mins $Therapeutic Activity: 8-22 mins                     Magda Kiel, PT Acute Rehabilitation Services [phone] 04/11/2022    Reginia Naas 04/11/2022, 4:31 PM

## 2022-04-12 ENCOUNTER — Ambulatory Visit (HOSPITAL_BASED_OUTPATIENT_CLINIC_OR_DEPARTMENT_OTHER): Payer: Self-pay | Admitting: Physical Therapy

## 2022-04-12 DIAGNOSIS — A409 Streptococcal sepsis, unspecified: Secondary | ICD-10-CM | POA: Diagnosis not present

## 2022-04-12 DIAGNOSIS — R443 Hallucinations, unspecified: Secondary | ICD-10-CM | POA: Insufficient documentation

## 2022-04-12 LAB — CULTURE, BLOOD (ROUTINE X 2)
Culture: NO GROWTH
Culture: NO GROWTH

## 2022-04-12 LAB — CREATININE, SERUM
Creatinine, Ser: 0.84 mg/dL (ref 0.61–1.24)
GFR, Estimated: >60 mL/min (ref >60)

## 2022-04-12 NOTE — Assessment & Plan Note (Signed)
Of infection.  There is minimal redness and swelling of this left leg.

## 2022-04-12 NOTE — Progress Notes (Signed)
Inpatient Rehabilitation Admissions Coordinator   I met at bedside with patient and wife. She is requesting SNF to give patient a prolonged rehab recovery before returning home with her. I have alerted acute team and TOC . We will sign off.  Danne Baxter, RN, MSN Rehab Admissions Coordinator [phone] 04/12/2022 9:59 AM

## 2022-04-12 NOTE — Assessment & Plan Note (Signed)
-   Recommend ABI as an outpatient of the left leg

## 2022-04-12 NOTE — Progress Notes (Signed)
Patient blew his nose this evening and it began to bleed. Pressure was applied for a few minutes and the bleeding subsided. Patient stated that he has nose bleeds when the air is dry. Will continue to monitor closely.

## 2022-04-12 NOTE — Assessment & Plan Note (Addendum)
MRI brain and Cspine negative.   - Hold Sinemet - Follow up with Neurology

## 2022-04-12 NOTE — Progress Notes (Signed)
Mobility Specialist Progress Note   04/12/22 1806  Mobility  Activity Transferred from chair to bed  Level of Assistance +2 (takes two people)  Assistive Device Other (Comment) (HHA)  Distance Ambulated (ft) 2 ft  Activity Response Tolerated well  $Mobility charge 1 Mobility   RN requesting assistance to get pt from chair to bed d/t increasing low back pain. Required +2A ModA to stand and pivot d/t pt general weakness in LE's and increased back pain. No fault on transfer, pt left in chair position in bed w/ call bell in reach and bed alarm on.   Holland Falling Mobility Specialist Please contact via SecureChat or  Rehab office at [phone]

## 2022-04-12 NOTE — Progress Notes (Signed)
  Inpatient Rehabilitation Admissions Coordinator   I met with patient and wife at bedside. I explained that a 2 week CIR admit would not be able to get him to an independent level as to go directly home. This goal clarification discussed with therapy as well as CIR rehab team. We could not admit to CIR for 2 weeks and then transition to SNF to prolong his rehab until independent level. We recommend SNF at this time.  Danne Baxter, RN, MSN Rehab Admissions Coordinator [phone] 04/12/2022 3:59 PM

## 2022-04-12 NOTE — Assessment & Plan Note (Addendum)
Patient met sepsis criteria with fever, leukocytosis and tachypnea.  He was found to have group B strep bacteremia.   Echocardiogram normal.  ID consult recommended 2 weeks penicillin with IV penicillin in the hospital, transition amoxicillin at discharge - Continue IV penicillin

## 2022-04-12 NOTE — Progress Notes (Signed)
  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 He fell 3-4 weeks ago sustaining deep abrasion to his left distal LE. He has had a full course of doxycycline and then a second course when the infection recurred. Today, after a neurologic consultation he got home and experienced rigors, profound weakness, developed a significant fever. Due to his inability to ambulate EMS was activated and he was brought to [facility] for evaluation.          Assessment and Plan: * Sepsis without end organ damage due to Group B streptococcus Patient met sepsis criteria with fever, leukocytosis and tachypnea.  He was found to have group B strep bacteremia.   Echocardiogram normal.  ID consult recommended 2 weeks penicillin with IV penicillin in the hospital, transition amoxicillin at discharge - Continue IV penicillin    Cellulitis of left lower leg Of infection.  There is minimal redness and swelling of this left leg.  PAD (peripheral artery disease) (HCC) - Recommend ABI as an outpatient of the left leg  GERD (gastroesophageal reflux disease) - Continue Pepcid  Unsteady gait MRI brain and Cspine negative.   - Hold Sinemet - Follow up with Neurology  Hallucinations Prior term "encephalopathy" used in previous notes, I think acute metabolic encephalopathy was ruled out, the patient had some hallucinations as a result of hospital delirium exacerbated by Sinemet in the setting of his underlying neurodegenerative disease.  MRI brain obtained as per neurology orders outpatient, unremarkable - Follow-up with outpatient neurology          Subjective: Patient is generally weak.  He has had no fever, new  respiratory distress or pain complaints. Redness of the scrotum noted.     Physical Exam: BP (!) 146/69 (BP Location: Left Arm)   Pulse (!) 53   Temp 99 F (37.2 C) (Oral)   Resp 17   Ht _0  (1.854 m)   Wt 78.8 kg   SpO2 99%   BMI 22.93 kg/m   Elderly adult male, lying in bed, no acute distress, appears tired RRR, no murmurs, no edema Respiratory rate normal, lungs clear without rales or wheezes Abdomen soft without tenderness palpation or guarding, no ascites or distention Attention normal, affect blunted, judgment insight appear mildly impaired    Data Reviewed: Mild hyponatremia Creatinine potassium normal CBC normal Glucose normal MRI of the brain and C-spine shows ventriculomegaly, no other acute findings    Family Communication: Wife at the bedside, daughter via phone    Disposition: Status is: Inpatient The patient was admitted for strep bacteremia.  His medical plan is clear and he is ready for discharge as soon as a safe bed is available.        Author: Edwin Dada, MD 04/12/2022 2:30 PM  For on call review [email or web address].

## 2022-04-12 NOTE — Assessment & Plan Note (Signed)
Prior term "encephalopathy" used in previous notes, I think acute metabolic encephalopathy was ruled out, the patient had some hallucinations as a result of hospital delirium exacerbated by Sinemet in the setting of his underlying neurodegenerative disease.  MRI brain obtained as per neurology orders outpatient, unremarkable - Follow-up with outpatient neurology

## 2022-04-12 NOTE — Assessment & Plan Note (Signed)
Continue Pepcid  

## 2022-04-12 NOTE — Progress Notes (Signed)
Mobility Specialist Progress Note    04/12/22 1535  Mobility  Activity Transferred from bed to chair  Level of Assistance +2 (takes two people)  Assistive Device Other (Comment) (HHA)  Activity Response Tolerated fair  $Mobility charge 1 Mobility   Pt received and agreeable. No complaints. MaxA+2 to sit EOB and to stand d/t posterior lean. Left with call bell in reach and visitor present.   Hildred Alamin Mobility Specialist  Please Psychologist, sport and exercise or Rehab Office at [phone]

## 2022-04-12 NOTE — Progress Notes (Signed)
3pm: CSW spoke with Pamala Hurry of [facility] who states she will speak with patient and Melvin [name] at bedside.  CSW spoke with Melvin [name] via phone to explain SNF options with current benefits.  2pm: CSW provided Melvin [name] with list of facilities that are in network with the patient's [employer].  10am: CSW spoke with patient and his spouse Melvin [name] at bedside to [facility] discharge plan. Melvin [name] states she is interested in CIR then SNF with an eventual discharge home with home health. 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

## 2022-04-13 DIAGNOSIS — A409 Streptococcal sepsis, unspecified: Secondary | ICD-10-CM | POA: Diagnosis not present

## 2022-04-13 MED ORDER — PANTOPRAZOLE SODIUM 20 MG PO TBEC: 20.0000 mg | DELAYED_RELEASE_TABLET | Freq: Two times a day (BID) | ORAL | Status: DC | PRN

## 2022-04-13 MED ORDER — BISACODYL 10 MG RE SUPP: 10.0000 mg | Freq: Every day | RECTAL | Status: DC | PRN

## 2022-04-13 NOTE — Progress Notes (Signed)
  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        12/5: Admitted on antibiotics   12/6: Blood cultures growing GBS; ID consulted; TTE negative      Assessment and Plan: * Sepsis without end organ damage due to Group B streptococcus Cellulitis of left lower leg - Continue IV penicillin     GERD (gastroesophageal reflux disease) - Continue Pepcid - Add PPI          Subjective: No change.  No fever.   Still generally weak, cognition slowed.     Physical Exam: BP (!) 162/81 (BP Location: Left Arm)   Pulse (!) 54   Temp 98.5 F (36.9 C) (Oral)   Resp 16   Ht '6\' 1"'$  (1.854 m)   Wt 78.8 kg   SpO2 99%   BMI 22.93 kg/m   Adult male, lying in bed, appears weak and tired RRR, no murmurs, no peripheral edema  Respiratory rate normal, lungs clear without rales or wheezes Abdomen soft Attention normal, affect blunted, judgment and insight appear mildly impaired, generalized weakness but symmetric strength There is mild swelling of the left leg, redness is better than yesterday    Data Reviewed: Creatinine yesterday is normal  Family Communication: Wife at the bedside    Disposition: Status is: Inpatient Patient was admitted for fall, found to have strep bacteremia, sepsis.  He is medically stabilized and appropriate treatment, he is significantly debilitated from his baseline level of function and will require rehabilitation.  He is medically ready for discharge to rehab whenever bed is available        Author: Edwin Dada, MD 04/13/2022 1:42 PM  For on call review [email or web address].

## 2022-04-13 NOTE — NC FL2 (Signed)
[place] LEVEL OF CARE FORM     IDENTIFICATION  Patient Name: Melvin [name] Birthdate: 17-Feb-1946 Sex: male Admission Date (Current Location): 04/05/2022  [facility] and [place] Number:  Herbalist and Address:  [facility]. [facility], [place] [address], [place], [place] [address]      Provider Number: [id number]  Attending Physician Name and Address:  Edwin Dada, *  Relative Name and Phone Number:       Current Level of Care: SNF Recommended Level of Care: [place] Prior Approval Number:  [id number]  Date Approved/Denied:   PASRR Number:    Discharge Plan: SNF    Current Diagnoses: Patient Active Problem List   Diagnosis Date Noted   Hallucinations 04/12/2022   PAD (peripheral artery disease) ([place]) 04/05/2022   Sepsis without end organ damage due to Group B streptococcus 04/05/2022   Physical deconditioning 03/01/2022   Unsteady gait 03/01/2022   Poor balance 03/01/2022   Cellulitis of left lower leg 02/23/2022   Hyperglycemia 02/15/2022   Dermatitis, unspecified 02/15/2022   Bilateral hearing loss due to cerumen impaction 02/15/2022   Biochemically recurrent malignant neoplasm of prostate ([place]) 10/18/2021   History of prostate cancer 03/22/2021   Glaucoma - [facility] 03/22/2021   Constipation 03/22/2021   H/O adenomatous polyp of colon 03/22/2021   GERD (gastroesophageal reflux disease) 03/22/2021   History of SCC (squamous cell carcinoma) of skin 03/22/2021   Allergic rhinitis 03/21/2021   Degenerative scoliosis 11/23/2020   Inguinal hernia 09/23/2020   Degenerative lumbar spinal stenosis 08/03/2019   Ankle arthritis 05/31/2018   Low back pain 10/30/2017   Posterior tibial tendon dysfunction (PTTD) of right lower extremity 02/24/2017    Orientation RESPIRATION BLADDER Height & Weight     Self, Time, Situation, Place  Normal Continent Weight: 173 lb 12.6 oz (78.8 kg) Height:  '6\' 1"'$  (185.4  cm)  BEHAVIORAL SYMPTOMS/MOOD NEUROLOGICAL BOWEL NUTRITION STATUS      Continent Diet  AMBULATORY STATUS COMMUNICATION OF NEEDS Skin   Extensive Assist Verbally Normal, Other (Comment) (Non pressure wound pretibial - left leg)                       Personal Care Assistance Level of Assistance  Bathing, Dressing, Feeding Bathing Assistance: Limited assistance Feeding assistance: Limited assistance Dressing Assistance: Limited assistance     Functional Limitations Info  Sight, Hearing, Speech Sight Info: Adequate Hearing Info: Adequate Speech Info: Adequate    SPECIAL CARE FACTORS FREQUENCY  PT (By licensed PT), OT (By licensed OT)     PT Frequency: 5x weekly OT Frequency: 5x weekly            Contractures Contractures Info: Not present    Additional Factors Info  Code Status, Allergies Code Status Info: Full Allergies Info: Sulfa antibiotics           Current Medications (04/13/2022):  This is the current hospital active medication list Current Facility-Administered Medications  Medication Dose Route Frequency Provider Last Rate Last Admin   acetaminophen (TYLENOL) tablet 650 mg  650 mg Oral Q6H PRN Norins, Heinz Knuckles, MD   650 mg at 04/11/22 1158   Or   acetaminophen (TYLENOL) suppository 650 mg  650 mg Rectal Q6H PRN Neena Rhymes, MD   650 mg at 04/06/22 1431   bisacodyl (DULCOLAX) suppository 10 mg  10 mg Rectal Q0600 Lorella Nimrod, MD   10 mg at 04/11/22 [phone]  enoxaparin (LOVENOX) injection 40 mg  40 mg Subcutaneous Q24H Norins, Heinz Knuckles, MD   40 mg at 04/12/22 0810   famotidine (PEPCID) tablet 20 mg  20 mg Oral BID Neena Rhymes, MD   20 mg at 04/12/22 2114   ketorolac (TORADOL) 15 MG/ML injection 15 mg  15 mg Intravenous Q6H PRN Dibia, Manfred Shirts, MD   15 mg at 04/11/22 2128   linaclotide (LINZESS) capsule 290 mcg  290 mcg Oral Daily PRN Neena Rhymes, MD   290 mcg at 04/08/22 0824   penicillin G potassium 12 Million Units in dextrose 5 %  500 mL continuous infusion  12 Million Units Intravenous Q12H Rolla Flatten, RPH 41.7 mL/hr at 04/13/22 0400 12 Million Units at 04/13/22 0400   traZODone (DESYREL) tablet 25 mg  25 mg Oral QHS PRN Neena Rhymes, MD   25 mg at 04/11/22 2128     Discharge Medications: Please see discharge summary for a list of discharge medications.  Relevant Imaging Results:  Relevant Lab Results:   Additional Information SSN: [id number]  Archie Endo, LCSW

## 2022-04-13 NOTE — Progress Notes (Addendum)
1:20pm: CSW spoke with Merrilyn Puma at [facility] who states the only availability tomorrow is for 7pm.  CSW spoke with representative at [facility] to arrange for pick up tomorrow at 10am.  11am: CSW was notified that [facility] can accept the patient.  Anderson Malta spoke with patient's wife Verdis Frederickson to inform her of bed offer and it was accepted.   10:30am: CSW spoke with Anderson Malta at [facility] to request she review patient.  10am: CSW completed FL2 and sent patient's clinical information out for review to local in network facilities.  CSW informed patient's wife of information.  Madilyn Fireman, MSW, LCSW Transitions of Care  Clinical Social Worker II [phone]

## 2022-04-13 NOTE — Progress Notes (Signed)
Physical Therapy Treatment Patient Details Name: Melvin [name] MRN: [id number] DOB: 03/06/1946 Today's Date: 04/13/2022   History of Present Illness Pt is 76 year old presented to [facility] on  04/05/22 with fever, weakness, and inability to ambulate. Pt with sepsis due to cellulitis of lt lower leg after traumatic abrasion and failed outpt antibiotic treatment. Pt found to have Group B strep. Pt developed visual hallucinations like due to new meds. Pt with unsteady gait and with very recent neuro eval and presumptive diagnosis of  Parkinson's PMH - scoliosis, lumbar stenosis, DJD lumbar spine, prostate CA. PAD    PT Comments    Patient progressing slowly.  Still limited by pain and posterior bias.  Improved with elbows on counter at nursing station.  Could potentially benefit from using [employer] walker initially to encourage anterior weight shift.  Wife present this session.  Noted patient not appropriate for CIR.  Curious if outside AIR could consider.  PT will continue to follow during acute stay.    Recommendations for follow up therapy are one component of a multi-disciplinary discharge planning process, led by the attending physician.  Recommendations may be updated based on patient status, additional functional criteria and insurance authorization.  Follow Up Recommendations  Acute inpatient rehab (3hours/day) Can patient physically be transported by private vehicle: No   Assistance Recommended at Discharge Frequent or constant Supervision/Assistance  Patient can return home with the following Two people to help with walking and/or transfers;A lot of help with bathing/dressing/bathroom;Assistance with cooking/housework;Assist for transportation;Help with stairs or ramp for entrance   Equipment Recommendations  Other (comment) (TBA)    Recommendations for Other Services       Precautions / Restrictions Precautions Precautions: Fall Precaution Comments: heavy posterior bias     Mobility   Bed Mobility Overal bed mobility: Needs Assistance Bed Mobility: Rolling, Sidelying to Sit Rolling: Mod assist Sidelying to sit: Mod assist       General bed mobility comments: cues for technique, assist for using rail.    Transfers Overall transfer level: Needs assistance Equipment used: Rolling walker (2 wheels) Transfers: Sit to/from Stand Sit to Stand: From elevated surface, Mod assist, +2 safety/equipment           General transfer comment: cues for hand placement, some lifting help due to posterior bias    Ambulation/Gait Ambulation/Gait assistance: Mod assist, +2 safety/equipment, Max assist Gait Distance (Feet): 15 Feet (& 20') Assistive device: Rolling walker (2 wheels) Gait Pattern/deviations: Step-to pattern, Trunk flexed, Wide base of support, Knee flexed in stance - left, Knee flexed in stance - right, Leaning posteriorly       General Gait Details: cues and assist for anterior weight shift, at pelvis, then switched to under bilat axillae with less posterior leaning. assist for lateral weight shift and keeping walker moving forward; pt verbalizing fear of falling   Stairs             Wheelchair Mobility    Modified Rankin (Stroke Patients Only)       Balance Overall balance assessment: Needs assistance Sitting-balance support: Feet supported Sitting balance-Leahy Scale: Poor Sitting balance - Comments: leaning back on hands, but able to sit with S after few minutes and to don his shoes with assist Postural control: Posterior lean Standing balance support: Bilateral upper extremity supported Standing balance-Leahy Scale: Poor Standing balance comment: UE support and mod A for balance               High Level Balance Comments:  activities at nursing station with elbows on counter for anterior/posterior weight shift through feet with hitting hips to counter ;x 5, then lateral weight shifts L to R x 5 mod A for moving weight over his feet             Cognition Arousal/Alertness: Awake/alert Behavior During Therapy: WFL for tasks assessed/performed Overall Cognitive Status: Impaired/Different from baseline Area of Impairment: Memory, Following commands, Problem solving, Attention                   Current Attention Level: Sustained   Following Commands: Follows one step commands consistently, Follows one step commands with increased time, Follows multi-step commands inconsistently     Problem Solving: Slow processing, Difficulty sequencing, Requires verbal cues General Comments: limited focus with quickly directing attention to distractions such as eye glasses, needing suppository, etc, needs redirection back to mobility tasks        Exercises Other Exercises Other Exercises: supine heel slides x 5 each leg with max multimodal cues Other Exercises: lateral trunk rotation (minimal ROM) AAROM x 5    General Comments General comments (skin integrity, edema, etc.): wife present and supportive and encouraging pt to focus on mobility      Pertinent Vitals/Pain Pain Assessment Pain Assessment: Faces Faces Pain Scale: Hurts even more Pain Location: back Pain Descriptors / Indicators: Aching, Discomfort, Grimacing, Guarding Pain Intervention(s): Monitored during session, Repositioned, Other (comment) (warm up stretches prior to OOB)    Home Living                          Prior Function            PT Goals (current goals can now be found in the care plan section) Progress towards PT goals: Progressing toward goals    Frequency    Min 3X/week      PT Plan Current plan remains appropriate    Co-evaluation              AM-PAC PT "6 Clicks" Mobility   Outcome Measure  Help needed turning from your back to your side while in a flat bed without using bedrails?: A Lot Help needed moving from lying on your back to sitting on the side of a flat bed without using bedrails?: A Lot Help  needed moving to and from a bed to a chair (including a wheelchair)?: Total Help needed standing up from a chair using your arms (e.g., wheelchair or bedside chair)?: Total Help needed to walk in hospital room?: Total Help needed climbing 3-5 steps with a railing? : Total 6 Click Score: 8    End of Session Equipment Utilized During Treatment: Gait belt Activity Tolerance: Patient limited by fatigue Patient left: in chair;with chair alarm set;with family/visitor present   PT Visit Diagnosis: Other abnormalities of gait and mobility (R26.89);Other symptoms and signs involving the nervous system (R29.898);Pain Pain - part of body:  (back)     Time: 3154-0086 PT Time Calculation (min) (ACUTE ONLY): 32 min  Charges:  $Gait Training: 8-22 mins $Therapeutic Activity: 8-22 mins                     Magda Kiel, PT Acute Rehabilitation Services Office:[phone] 04/13/2022    Reginia Naas 04/13/2022, 10:23 AM

## 2022-04-13 NOTE — Progress Notes (Signed)
Occupational Therapy Treatment Patient Details Name: Melvin [name] MRN: [id number] DOB: 02-25-1946 Today's Date: 04/13/2022   History of present illness Pt is 76 year old presented to [facility] on  04/05/22 with fever, weakness, and inability to ambulate. Pt with sepsis due to cellulitis of lt lower leg after traumatic abrasion and failed outpt antibiotic treatment. Pt found to have Group B strep. Pt developed visual hallucinations like due to new meds. Pt with unsteady gait and with very recent neuro eval and presumptive diagnosis of  Parkinson's PMH - scoliosis, lumbar stenosis, DJD lumbar spine, prostate CA. PAD   OT comments  Pt making steady progress towards OT goals this session. Pt continues to present with impaired cognition, impaired balance and posterior lean in standing . Session focus on BADL reeducation, functional mobility and increasing overall activity tolerance. Pt currently requires MOD A+2 for ADL transfers with RW and MAX A +2 for 3/3 toileting tasks. Pt continues to present with impaired Memory, Following commands, Problem solving, Attention, Safety/judgement, and Awareness. Pt would continue to benefit from skilled occupational therapy while admitted and after d/c to address the below listed limitations in order to improve overall functional mobility and facilitate independence with BADL participation. DC plan remains appropriate, will follow acutely per POC.       Recommendations for follow up therapy are one component of a multi-disciplinary discharge planning process, led by the attending physician.  Recommendations may be updated based on patient status, additional functional criteria and insurance authorization.    Follow Up Recommendations  Acute inpatient rehab (3hours/day)     Assistance Recommended at Discharge Intermittent Supervision/Assistance  Patient can return home with the following  Two people to help with walking and/or transfers;Two people to help with  bathing/dressing/bathroom;Assistance with cooking/housework;Help with stairs or ramp for entrance;Assist for transportation;Direct supervision/assist for financial management;Direct supervision/assist for medications management;Assistance with feeding   Equipment Recommendations  Other (comment) (defer to next venue of care)    Recommendations for Other Services      Precautions / Restrictions Precautions Precautions: Fall Precaution Comments: heavy posterior bias Restrictions Weight Bearing Restrictions: No       Mobility Bed Mobility               General bed mobility comments: pt greeted EOB with NT present    Transfers Overall transfer level: Needs assistance Equipment used: Rolling walker (2 wheels) Transfers: Sit to/from Stand, Bed to chair/wheelchair/BSC Sit to Stand: Mod assist Stand pivot transfers: Mod assist, +2 physical assistance, +2 safety/equipment         General transfer comment: MODA  to rise from EOB and BSC, cues needed for hand placement and max cues to shift weight anteriorly during sit>stand. pt completed stand pivot to [facility] with RW and MODA  +2, pt with difficulty pivoting BLEs during transfer needing max multimodal cues     Balance Overall balance assessment: Needs assistance Sitting-balance support: Feet supported Sitting balance-Leahy Scale: Poor     Standing balance support: Bilateral upper extremity supported, Reliant on assistive device for balance Standing balance-Leahy Scale: Poor Standing balance comment: UE support and mod A for balance                           ADL either performed or assessed with clinical judgement   ADL Overall ADL's : Needs assistance/impaired  Toilet Transfer: Moderate assistance;+2 for physical assistance;+2 for safety/equipment;Stand-pivot;Rolling walker (2 wheels);BSC/3in1 Toilet Transfer Details (indicate cue type and reason): MOD A +2 to pivot to recliner  with pt having difficulty advancing BLEs during pivot, assist needed for RW mgmt Toileting- Clothing Manipulation and Hygiene: Maximal assistance;Sit to/from stand;+2 for physical assistance Toileting - Clothing Manipulation Details (indicate cue type and reason): +2 needed for UB support while second person completed pericare in standing     Functional mobility during ADLs: Moderate assistance;+2 for physical assistance;+2 for safety/equipment;Rolling walker (2 wheels) (stand pivot only) General ADL Comments: ADL participation impacted by decreased ability to advance BLE during transfers, posterior lean and impaired cog    Extremity/Trunk Assessment Upper Extremity Assessment Upper Extremity Assessment: Generalized weakness   Lower Extremity Assessment Lower Extremity Assessment: Defer to PT evaluation        Vision Baseline Vision/History: 1 Wears glasses Ability to See in Adequate Light: 0 Adequate Patient Visual Report: No change from baseline     Perception Perception Perception: Not tested   Praxis Praxis Praxis: Not tested    Cognition Arousal/Alertness: Awake/alert Behavior During Therapy: WFL for tasks assessed/performed Overall Cognitive Status: Impaired/Different from baseline Area of Impairment: Memory, Following commands, Problem solving, Attention, Safety/judgement, Awareness                   Current Attention Level: Sustained Memory: Decreased short-term memory Following Commands: Follows one step commands consistently, Follows one step commands with increased time, Follows multi-step commands inconsistently Safety/Judgement: Decreased awareness of deficits Awareness: Intellectual Problem Solving: Slow processing, Difficulty sequencing, Requires verbal cues, Decreased initiation, Requires tactile cues General Comments: impaired memory asking where his phone is when it is around his neck, asking for his shoes to be donned since he will be DC'ed  tomorrow. perseverating on peps[employer], impaired awareness into deficits needing MAX cues to explain why therapist utilized stedy        Exercises      Shoulder Instructions       General Comments wife present during session, + small BM during session, NT aware    Pertinent Vitals/ Pain       Pain Assessment Pain Assessment: Faces Faces Pain Scale: Hurts little more Pain Location: low back, L shin wound Pain Descriptors / Indicators: Aching, Discomfort, Grimacing, Guarding Pain Intervention(s): Limited activity within patient's tolerance, Monitored during session, Repositioned  Home Living                                          Prior Functioning/Environment              Frequency  Min 2X/week        Progress Toward Goals  OT Goals(current goals can now be found in the care plan section)  Progress towards OT goals: Progressing toward goals  Acute Rehab OT Goals Patient Stated Goal: to have BM OT Goal Formulation: With patient Time For Goal Achievement: 04/22/22 Potential to Achieve Goals: Good  Plan Discharge plan remains appropriate;Frequency remains appropriate    Co-evaluation                 AM-PAC OT "6 Clicks" Daily Activity     Outcome Measure   Help from another person eating meals?: A Little Help from another person taking care of personal grooming?: A Lot Help from another person toileting, which includes using toliet, bedpan, or urinal?:  A Lot Help from another person bathing (including washing, rinsing, drying)?: A Lot Help from another person to put on and taking off regular upper body clothing?: A Lot Help from another person to put on and taking off regular lower body clothing?: A Lot 6 Click Score: 13    End of Session Equipment Utilized During Treatment: Gait belt;Rolling walker (2 wheels)  OT Visit Diagnosis: Unsteadiness on feet (R26.81);Other abnormalities of gait and mobility (R26.89);Muscle weakness  (generalized) (M62.81);Other symptoms and signs involving cognitive function;Pain   Activity Tolerance Patient tolerated treatment well   Patient Left in chair;with call bell/phone within reach;with chair alarm set   Nurse Communication Mobility status;Other (comment) (stedy back to bed)        Time: 1497-0263 OT Time Calculation (min): 21 min  Charges: OT General Charges $OT Visit: 1 Visit OT Treatments $Self Care/Home Management : 8-22 mins  Harley Alto., COTA/L Acute Rehabilitation Services [phone]   Precious Haws 04/13/2022, 2:28 PM

## 2022-04-14 LAB — SUSCEPTIBILITY RESULT

## 2022-04-14 LAB — SUSCEPTIBILITY, AER + ANAEROB

## 2022-04-14 MED ORDER — TRAZODONE HCL 50 MG PO TABS: 25.0000 mg | ORAL_TABLET | Freq: Every evening | ORAL | Status: DC | PRN

## 2022-04-14 MED ORDER — AMOXICILLIN 500 MG PO TABS: 500.0000 mg | ORAL_TABLET | Freq: Three times a day (TID) | ORAL | 0 refills | Status: AC

## 2022-04-14 NOTE — Progress Notes (Signed)
Patient can be transferred to [facility] today.  PTAR has been scheduled for pick up at 10am.  The number to call for report is [phone].  Madilyn Fireman, MSW, LCSW Transitions of Care  Clinical Social Worker II [phone]

## 2022-04-14 NOTE — Discharge Summary (Signed)
Physician Discharge Summary   Patient: Melvin [name] MRN: [id number] DOB: [birth year]  Admit date:     04/05/2022  Discharge date: 04/14/22  Discharge Physician: Edwin Dada   PCP: [name], MD     Recommendations at discharge:  Continue amoxicillin 500 mg TID for 1 week Repeat CRP in 1 week Follow up with Neurology Dr. Berdine Addison as directed or sooner as needed Dr. Berdine Addison:  If patient does not progress to independence within 1-2 weeks in an inpatient rehab setting, this is well outside the expected for recovery from strep bacteremia and implies a more rapid neurological decline --> please arrange closer follow up in your office to re-evaluate at that time  Follow up with PCP Dr. Quay Burow within 1 week of rehab discharge Dr. Quay Burow:  Please arrange ABI as outpatient given poorly healing shin wound      Discharge Diagnoses: Principal Problem:   Sepsis without end organ damage due to Group B streptococcus Active Problems:   Cellulitis of left lower leg   PAD (peripheral artery disease) (HCC)   GERD (gastroesophageal reflux disease)   Unsteady gait   Hallucinations     Hospital Course: Melvin [name] is a 76 y.o. M with hx prostate CA, low back pain and recent work up for progressive gait abnormality who presented with rigors, weakness and fever.           * Sepsis without end organ damage due to Group B streptococcus bacteremia due to left leg cellulitis Patient met sepsis criteria on admission with fever, leukocytosis and tachypnea.  He was found to have group B strep bacteremia.   Echocardiogram normal.  ID consult recommended 2 weeks penicillin with IV penicillin in the hospital, transition amoxicillin at discharge  They suspected source was left leg infection.     Cellulitis of left lower leg This has no surrounding cellulitis any more.  There was a surface swab whose significance is unclear.  It does have a punched out appearance which seems out of  character for a wound from a fall, and suggests pyoderma gangrenosum.    PAD (peripheral artery disease) (HCC) Recommend ABI as an outpatient of the left leg    Unsteady gait Hallucinations MRI brain and C-spine negative.    The patient had some hallucinations as a result of hospital delirium exacerbated by Sinemet in the setting of his underlying neurodegenerative disease.  MRI brain obtained as per neurology orders outpatient, unremarkable  Sinemet was held and hallucinations resolved, but generalized weakness persisted.  The patient's new onset of weakness is abrupt per wife report.            The [facility] Controlled Substances Registry was reviewed for this patient prior to discharge.  Consultants: Infectious disease Procedures performed: Echocardiogram  Disposition:  Inpatient rehab   DISCHARGE MEDICATION: Allergies as of 04/14/2022       Reactions   Sulfa Antibiotics Hives        Medication List     STOP taking these medications    carbidopa-levodopa 25-100 MG tablet Commonly known as: SINEMET IR   doxycycline 100 MG tablet Commonly known as: VIBRA-TABS   fluocinonide cream 0.05 % Commonly known as: LIDEX       TAKE these medications    acetaminophen 500 MG tablet Commonly known as: TYLENOL Take 500 mg by mouth every 6 (six) hours as needed for moderate pain.   amoxicillin 500 MG tablet Commonly known as: AMOXIL Take 1 tablet (500 mg total)  by mouth in the morning, at noon, and at bedtime for 7 days.   b complex vitamins capsule Take 1 capsule by mouth daily.   cimetidine 200 MG tablet Commonly known as: TAGAMET Take 200 mg by mouth 2 (two) times daily.   Cosopt PF 2-0.5 % Soln Generic drug: Dorzolamide HCl-Timolol Mal PF Place 1 drop into both eyes 2 (two) times daily.   Edex 20 MCG injection Generic drug: alprostadil 20 mcg by Intracavitary route as needed for erectile dysfunction.   ibuprofen 200 MG tablet Commonly  known as: ADVIL Take 200 mg by mouth every 8 (eight) hours as needed for moderate pain.   linaclotide 290 MCG Caps capsule Commonly known as: LINZESS Take 1 capsule (290 mcg total) by mouth daily as needed (constipation).   multivitamin with minerals Tabs tablet Take 1 tablet by mouth daily with lunch.   phenylephrine 1 % nasal spray Commonly known as: NEO-SYNEPHRINE Place 1 drop into both nostrils as needed for congestion.   traZODone 50 MG tablet Commonly known as: DESYREL Take 0.5 tablets (25 mg total) by mouth at bedtime as needed for sleep.   Zioptan 0.0015 % Soln Generic drug: Tafluprost (PF) Place 1 drop into both eyes at bedtime.               Discharge Care Instructions  (From admission, onward)           Start     Ordered   04/14/22 0000  Discharge wound care:       Comments: Wash with sterile saline and pat dry gently Cover with nonstick bandage and wrap Change dressing daily   04/14/22 1010            Contact information for follow-up providers     [name], MD Follow up.   Specialty: Internal Medicine Contact information: [place] [address] [phone]         Shellia Carwin, MD Follow up.   Specialty: Neurology Contact information: [address] [place] [address] [phone]              Contact information for after-discharge care     [facility] .   Service: Inpatient Rehabilitation Contact information: [place]. [place] [phone]                     Discharge Instructions     Discharge wound care:   Complete by: As directed    Wash with sterile saline and pat dry gently Cover with nonstick bandage and wrap Change dressing daily       Discharge Exam: Filed Weights   04/05/22 1506  Weight: 78.8 kg    General: Pt is alert, awake, not in acute distress,  appears very sleepy Cardiovascular: RRR, nl S1-S2, no murmurs appreciated.   No LE edema.   Respiratory: Normal respiratory rate and rhythm.  CTAB without rales or wheezes. Abdominal: Abdomen soft and non-tender.  No distension or HSM.   Neuro/Psych: Strength symmetric in upper and lower extremities but severe generalized weakness.  Judgment and insight appear normal and he is oriented.   Condition at discharge: stable  The results of significant diagnostics from this hospitalization (including imaging, microbiology, ancillary and laboratory) are listed below for reference.   Imaging Studies: MR BRAIN WO CONTRAST  Result Date: 04/08/2022 CLINICAL DATA:  Hydrocephalus. EXAM: MRI HEAD WITHOUT CONTRAST  TECHNIQUE: Multiplanar, multiecho pulse sequences of the brain and surrounding structures were obtained without intravenous contrast. COMPARISON:  Head CT 04/07/2022 FINDINGS: A coronal T2 sequence is not obtained as the patient was unable to tolerate the complete examination. Brain: There is no evidence of an acute infarct, mass, midline shift, or extra-axial fluid collection. Patchy T2 hyperintensities in the cerebral white matter and pons are nonspecific but compatible with moderate chronic small vessel ischemic disease. An asymmetric 2 cm region of T2 hyperintensity/gliosis is noted in the subcortical white matter of the posterolateral left frontal lobe with some associated curvilinear susceptibility which may reflect chronic blood products or vessels/possible developmental venous anomaly. 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 Lateral ventriculomegaly is again noted, including prominent enlargement of the left greater than right atria and occipital horns with surrounding white matter volume loss. The temporal horns are only mildly enlarged. The third ventricle and cerebral sulci are mildly enlarged. Vascular: Major intracranial vascular flow voids are preserved. Skull and  upper cervical spine: Unremarkable bone marrow signal para Sinuses/Orbits: Bilateral cataract extraction. Clear paranasal sinuses. Small right mastoid effusion. Other: None. IMPRESSION: 1. No acute intracranial abnormality. 2. Moderate chronic small vessel ischemic disease. 3. Ventriculomegaly which is favored to reflect advanced central predominant cerebral atrophy rather than hydrocephalus. Electronically Signed   By: Logan Bores M.D.   On: 04/08/2022 14:57   MR CERVICAL SPINE WO CONTRAST  Result Date: 04/08/2022 CLINICAL DATA:  Ataxia, nontraumatic.  Cervical pathology suspected. EXAM: MRI CERVICAL SPINE WITHOUT CONTRAST TECHNIQUE: Multiplanar, multisequence MR imaging of the cervical spine was performed. No intravenous contrast was administered. COMPARISON:  None Available. FINDINGS: Despite efforts by the technologist and patient, mild motion artifact is present on today's exam and could not be eliminated. This reduces exam sensitivity and specificity. Alignment: Straightening with a slight retrolisthesis at the C5-6 and C6-7 levels. Vertebrae: No acute or suspicious osseous findings. Mild endplate degenerative changes at C5-6 and C6-7. Cord: Normal in signal and caliber. Posterior Fossa, vertebral arteries, paraspinal tissues: Intracranial findings are dictated separately. The visualized posterior fossa appears unremarkable.Bilateral vertebral artery flow voids. No significant paraspinal findings. Disc levels: Details of the individual disc space levels are partly obscured by motion artifact. C2-3: Normal interspace. C3-4: Asymmetric uncinate spurring and facet hypertrophy on the right contributing to mild to moderate right foraminal narrowing. No cord deformity. The left foramen appears widely patent. C4-5: Preserved disc height. Mild facet hypertrophy, right greater than left. No spinal stenosis or nerve root encroachment. C5-6: Chronic loss of disc height with posterior osteophytes and bilateral  uncinate spurring. The CSF surrounding the cord is partially effaced without cord deformity. Moderate foraminal narrowing bilaterally which appears chronic. C6-7: Chronic loss of disc height with posterior osteophytes and bilateral uncinate spurring. Mild right and moderate to severe left foraminal narrowing which appears chronic. No cord deformity. C7-T1: Normal interspace. IMPRESSION: 1. No acute findings or clear explanation for the patient's symptoms. 2. 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 3. Mild to moderate right foraminal narrowing at C3-4 due to uncinate spurring and facet hypertrophy. 4. No cord deformity or abnormal cord signal. Electronically Signed   By: Richardean Sale M.D.   On: 04/08/2022 14:57   CT HEAD WO CONTRAST (5MM)  Result Date: 04/07/2022 CLINICAL DATA:  Mental status change EXAM: CT HEAD WITHOUT CONTRAST TECHNIQUE: Contiguous axial images were  obtained from the base of the skull through the vertex without intravenous contrast. RADIATION DOSE REDUCTION: This exam was performed according to the departmental dose-optimization program which includes automated exposure control, adjustment of the mA and/or kV according to patient size and/or use of iterative reconstruction technique. COMPARISON:  None Available. FINDINGS: Brain: No evidence of acute infarction, hemorrhage, mass, mass effect, or midline shift. No hydrocephalus or extra-axial fluid collection. Dilatation of the ventricles, asymmetrically greater on the left and most prominent in the occipital lobes. There is some crowding of the sulci at the vertex, with an acute callosal angle. Vascular: No hyperdense vessel. Skull: Normal. Negative for fracture or focal lesion. Sinuses/Orbits: No acute finding. Other: The mastoid air cells are well aerated. IMPRESSION: 1. No acute intracranial  process. 2. Dilatation of the ventricles, asymmetrically greater on the left and most prominent in the occipital lobes. While this could reflect central cerebral atrophy, there is some crowding of the sulci at the vertex, with an acute callosal angle, which can be seen in the setting of normal pressure hydrocephalus. Correlate with symptoms and consider CSF tap test clinically indicated. Electronically Signed   By: Merilyn Baba M.D.   On: 04/07/2022 18:43   ECHOCARDIOGRAM COMPLETE  Result Date: 04/06/2022    ECHOCARDIOGRAM REPORT   Patient Name:   Melvin [name] Date of Exam: 04/06/2022 Medical Rec #:  [id number]   Height:       73.0 in Accession #:    [id number]  Weight:       173.8 lb Date of Birth:  09-14-1945   BSA:          2.027 m Patient Age:    24 years    BP:           149/49 mmHg Patient Gender: M           HR:           66 bpm. Exam Location:  Inpatient Procedure: 2D Echo, Cardiac Doppler and Color Doppler Indications:    Bacteremia R78.81  History:        Patient has no prior history of Echocardiogram examinations.                 Risk Factors:GERD.  Sonographer:    Bernadene Person RDCS Referring Phys: [id number] [facility]  Sonographer Comments: Image acquisition challenging due to respiratory motion. IMPRESSIONS  1. Left ventricular ejection fraction, by estimation, is 55 to 60%. The left ventricle has normal function. The left ventricle has no regional wall motion abnormalities. Left ventricular diastolic parameters were normal.  2. Right ventricular systolic function is normal. The right ventricular size is normal. There is mildly elevated pulmonary artery systolic pressure. The estimated right ventricular systolic pressure is 42.3 mmHg.  3. Right atrial size was mildly dilated.  4. The mitral valve is normal in structure. Trivial mitral valve regurgitation. No evidence of mitral stenosis.  5. The aortic valve is tricuspid. Aortic valve regurgitation is not visualized. No aortic stenosis is present.   6. Aortic dilatation noted. There is mild dilatation of the aortic root, measuring 40 mm.  7. The inferior vena cava is normal in size with <50% respiratory variability, suggesting right atrial pressure of 8 mmHg.  8. No definite vegetation noted. If suspicion is high, need TEE. FINDINGS  Left Ventricle: Left ventricular ejection fraction, by estimation, is 55 to 60%. The left ventricle has normal function. The left ventricle has no regional wall motion abnormalities. The left ventricular internal  cavity size was normal in size. There is  no left ventricular hypertrophy. Left ventricular diastolic parameters were normal. Right Ventricle: The right ventricular size is normal. No increase in right ventricular wall thickness. Right ventricular systolic function is normal. There is mildly elevated pulmonary artery systolic pressure. The tricuspid regurgitant velocity is 2.97  m/s, and with an assumed right atrial pressure of 8 mmHg, the estimated right ventricular systolic pressure is 35.4 mmHg. Left Atrium: Left atrial size was normal in size. Right Atrium: Right atrial size was mildly dilated. Pericardium: There is no evidence of pericardial effusion. Mitral Valve: The mitral valve is normal in structure. There is mild calcification of the mitral valve leaflet(s). Mild mitral annular calcification. Trivial mitral valve regurgitation. No evidence of mitral valve stenosis. Tricuspid Valve: The tricuspid valve is normal in structure. Tricuspid valve regurgitation is trivial. Aortic Valve: The aortic valve is tricuspid. Aortic valve regurgitation is not visualized. No aortic stenosis is present. Pulmonic Valve: The pulmonic valve was normal in structure. Pulmonic valve regurgitation is trivial. Aorta: Aortic dilatation noted. There is mild dilatation of the aortic root, measuring 40 mm. Venous: The inferior vena cava is normal in size with less than 50% respiratory variability, suggesting right atrial pressure of 8 mmHg.  IAS/Shunts: No atrial level shunt detected by color flow Doppler.  LEFT VENTRICLE PLAX 2D LVIDd:         4.90 cm      Diastology LVIDs:         3.10 cm      LV e' medial:    8.11 cm/s LV PW:         1.00 cm      LV E/e' medial:  11.0 LV IVS:        1.00 cm      LV e' lateral:   11.80 cm/s LVOT diam:     2.20 cm      LV E/e' lateral: 7.6 LV SV:         76 LV SV Index:   37 LVOT Area:     3.80 cm  LV Volumes (MOD) LV vol d, MOD A2C: 112.0 ml LV vol d, MOD A4C: 126.0 ml LV vol s, MOD A2C: 46.6 ml LV vol s, MOD A4C: 49.3 ml LV SV MOD A2C:     65.4 ml LV SV MOD A4C:     126.0 ml LV SV MOD BP:      71.2 ml RIGHT VENTRICLE RV S prime:     25.20 cm/s TAPSE (M-mode): 2.6 cm LEFT ATRIUM             Index        RIGHT ATRIUM           Index LA diam:        4.30 cm 2.12 cm/m   RA Area:     19.40 cm LA Vol (A2C):   60.3 ml 29.75 ml/m  RA Volume:   52.10 ml  25.70 ml/m LA Vol (A4C):   55.3 ml 27.28 ml/m LA Biplane Vol: 60.6 ml 29.90 ml/m  AORTIC VALVE             PULMONIC VALVE LVOT Vmax:   104.00 cm/s PR End Diast Vel: 3.44 msec LVOT Vmean:  72.000 cm/s LVOT VTI:    0.199 m  AORTA Ao Root diam: 4.00 cm Ao Asc diam:  3.70 cm MITRAL VALVE  TRICUSPID VALVE MV Area (PHT): 3.17 cm    TR Peak grad:   35.3 mmHg MV Decel Time: 239 msec    TR Vmax:        297.00 cm/s MV E velocity: 89.50 cm/s MV A velocity: 50.10 cm/s  SHUNTS MV E/A ratio:  1.79        Systemic VTI:  0.20 m                            Systemic Diam: 2.20 cm Dalton McleanMD Electronically signed by Franki Monte Signature Date/Time: 04/06/2022/5:03:37 PM    Final    DG Chest 2 View  Result Date: 04/05/2022 CLINICAL DATA:  Possible infiltrate. EXAM: CHEST - 2 VIEW COMPARISON:  April 05, 2022. FINDINGS: The heart size and mediastinal contours are within normal limits. Both lungs are clear. The visualized skeletal structures are unremarkable. IMPRESSION: No active cardiopulmonary disease. Electronically Signed   By: Marijo Conception M.D.   On:  04/05/2022 17:54   DG Chest Port 1 View  Result Date: 04/05/2022 CLINICAL DATA:  Questionable sepsis - evaluate for abnormality EXAM: PORTABLE CHEST 1 VIEW COMPARISON:  Radiographs 07/28/2021, PET CT 10/26/2021 FINDINGS: Slight volume loss at the left lung base with elevation of hemidiaphragm and ill-defined basilar opacity. The right lung is clear. The heart is stable in size with unchanged mediastinal contours. No pulmonary edema, pleural effusion or pneumothorax. Stable osseous structures. IMPRESSION: Slight volume loss at the left lung base with ill-defined basilar opacity, atelectasis versus pneumonia. Electronically Signed   By: Keith Rake M.D.   On: 04/05/2022 16:06    Microbiology: Results for orders placed or performed during the hospital encounter of 04/05/22  Resp Panel by RT-PCR (Flu A&B, Covid)     Status: None   Collection Time: 04/05/22  3:23 PM   Specimen: Nasal Swab  Result Value Ref Range Status   SARS Coronavirus 2 by RT PCR NEGATIVE NEGATIVE Final    Comment: (NOTE) SARS-CoV-2 target nucleic acids are NOT DETECTED.  The SARS-CoV-2 RNA is generally detectable in upper respiratory specimens during the acute phase of infection. The lowest concentration of SARS-CoV-2 viral copies this assay can detect is 138 copies/mL. A negative result does not preclude SARS-Cov-2 infection and should not be used as the sole basis for treatment or other patient management decisions. A negative result may occur with  improper specimen collection/handling, submission of specimen other than nasopharyngeal swab, presence of viral mutation(s) within the areas targeted by this assay, and inadequate number of viral copies(<138 copies/mL). A negative result must be combined with clinical observations, patient history, and epidemiological information. The expected result is Negative.  Fact Sheet for Patients:  EntrepreneurPulse.com.au  Fact Sheet for Healthcare  Providers:  IncredibleEmployment.be  This test is no t yet approved or cleared by the [place] FDA and  has been authorized for detection and/or diagnosis of SARS-CoV-2 by FDA under an Emergency Use Authorization (EUA). This EUA will remain  in effect (meaning this test can be used) for the duration of the COVID-19 declaration under Section 564(b)(1) of the Act, 21 U.S.C.section 360bbb-3(b)(1), unless the authorization is terminated  or revoked sooner.       Influenza A by PCR NEGATIVE NEGATIVE Final   Influenza B by PCR NEGATIVE NEGATIVE Final    Comment: (NOTE) The Xpert Xpress SARS-CoV-2/FLU/RSV plus assay is intended as an aid in the diagnosis of influenza from Nasopharyngeal swab specimens and  should not be used as a sole basis for treatment. Nasal washings and aspirates are unacceptable for Xpert Xpress SARS-CoV-2/FLU/RSV testing.  Fact Sheet for Patients: EntrepreneurPulse.com.au  Fact Sheet for Healthcare Providers: IncredibleEmployment.be  This test is not yet approved or cleared by the [place] FDA and has been authorized for detection and/or diagnosis of SARS-CoV-2 by FDA under an Emergency Use Authorization (EUA). This EUA will remain in effect (meaning this test can be used) for the duration of the COVID-19 declaration under Section 564(b)(1) of the Act, 21 U.S.C. section 360bbb-3(b)(1), unless the authorization is terminated or revoked.  Performed at [facility], [place] [address], [place], [place] [address]   Blood Culture (routine single)     Status: Abnormal   Collection Time: 04/05/22  3:45 PM   Specimen: BLOOD  Result Value Ref Range Status   Specimen Description BLOOD SITE NOT SPECIFIED  Final   Special Requests   Final    BOTTLES DRAWN AEROBIC AND ANAEROBIC Blood Culture adequate volume   Culture  Setup Time   Final    GRAM POSITIVE COCCI IN PAIRS IN CHAINS IN BOTH AEROBIC AND  ANAEROBIC BOTTLES CRITICAL RESULT CALLED TO, READ BACK BY AND VERIFIED WITH: Barbaraann Boys RN 04/06/22 @ 0643 BY AB Performed at [facility], [place] [address], [place], [place] [address]    Culture GROUP B STREP(S.AGALACTIAE)ISOLATED (A)  Final   Report Status 04/08/2022 FINAL  Final   Organism ID, Bacteria GROUP B STREP(S.AGALACTIAE)ISOLATED  Final      Susceptibility   Group b strep(s.agalactiae)isolated - MIC*    CLINDAMYCIN <=0.25 SENSITIVE Sensitive     AMPICILLIN <=0.25 SENSITIVE Sensitive     ERYTHROMYCIN <=0.12 SENSITIVE Sensitive     VANCOMYCIN 0.5 SENSITIVE Sensitive     CEFTRIAXONE <=0.12 SENSITIVE Sensitive     LEVOFLOXACIN 1 SENSITIVE Sensitive     PENICILLIN <=0.06 SENSITIVE Sensitive     * GROUP B STREP(S.AGALACTIAE)ISOLATED  Blood Culture ID Panel (Reflexed)     Status: Abnormal   Collection Time: 04/05/22  3:45 PM  Result Value Ref Range Status   Enterococcus faecalis NOT DETECTED NOT DETECTED Final   Enterococcus Faecium NOT DETECTED NOT DETECTED Final   Listeria monocytogenes NOT DETECTED NOT DETECTED Final   Staphylococcus species NOT DETECTED NOT DETECTED Final   Staphylococcus aureus (BCID) NOT DETECTED NOT DETECTED Final   Staphylococcus epidermidis NOT DETECTED NOT DETECTED Final   Staphylococcus lugdunensis NOT DETECTED NOT DETECTED Final   Streptococcus species DETECTED (A) NOT DETECTED Final    Comment: CRITICAL RESULT CALLED TO, READ BACK BY AND VERIFIED WITH: Barbaraann Boys RN 04/06/22 @ [phone] BY AB    Streptococcus agalactiae DETECTED (A) NOT DETECTED Final    Comment: CRITICAL RESULT CALLED TO, READ BACK BY AND VERIFIED WITH: Barbaraann Boys RN 04/06/22 @ 7846 BY AB    Streptococcus pneumoniae NOT DETECTED NOT DETECTED Final   Streptococcus pyogenes NOT DETECTED NOT DETECTED Final   A.calcoaceticus-baumannii NOT DETECTED NOT DETECTED Final   Bacteroides fragilis NOT DETECTED NOT DETECTED Final   Enterobacterales NOT DETECTED NOT DETECTED Final    Enterobacter cloacae complex NOT DETECTED NOT DETECTED Final   Escherichia coli NOT DETECTED NOT DETECTED Final   Klebsiella aerogenes NOT DETECTED NOT DETECTED Final   Klebsiella oxytoca NOT DETECTED NOT DETECTED Final   Klebsiella pneumoniae NOT DETECTED NOT DETECTED Final   Proteus species NOT DETECTED NOT DETECTED Final   Salmonella species NOT DETECTED NOT DETECTED Final  Serratia marcescens NOT DETECTED NOT DETECTED Final   Haemophilus influenzae NOT DETECTED NOT DETECTED Final   Neisseria meningitidis NOT DETECTED NOT DETECTED Final   Pseudomonas aeruginosa NOT DETECTED NOT DETECTED Final   Stenotrophomonas maltophilia NOT DETECTED NOT DETECTED Final   Candida albicans NOT DETECTED NOT DETECTED Final   Candida auris NOT DETECTED NOT DETECTED Final   Candida glabrata NOT DETECTED NOT DETECTED Final   Candida krusei NOT DETECTED NOT DETECTED Final   Candida parapsilosis NOT DETECTED NOT DETECTED Final   Candida tropicalis NOT DETECTED NOT DETECTED Final   Cryptococcus neoformans/gattii NOT DETECTED NOT DETECTED Final    Comment: Performed at [facility], [place] [address], [place], [place] [address]  Aerobic Culture w Gram Stain (superficial specimen)     Status: None (Preliminary result)   Collection Time: 04/06/22  9:47 AM   Specimen: Wound  Result Value Ref Range Status   Specimen Description WOUND  Final   Special Requests LEFT LEG  Final   Gram Stain   Final    NO WBC SEEN FEW GRAM POSITIVE COCCI IN PAIRS FEW GRAM POSITIVE RODS    Culture   Final    ABUNDANT STAPHYLOCOCCUS AUREUS RARE CORYNEBACTERIUM AMYCOLATUM Standardized susceptibility testing for this organism is not available. MODERATE ACINETOBACTER JUNII Sent to [place] for further susceptibility testing. Performed at [facility], [place] [address], [place], [place] [address]    Report Status PENDING  Incomplete   Organism ID, Bacteria STAPHYLOCOCCUS AUREUS  Final      Susceptibility   Staphylococcus  aureus - MIC*    CIPROFLOXACIN <=0.5 SENSITIVE Sensitive     ERYTHROMYCIN >=8 RESISTANT Resistant     GENTAMICIN <=0.5 SENSITIVE Sensitive     OXACILLIN 0.5 SENSITIVE Sensitive     TETRACYCLINE <=1 SENSITIVE Sensitive     VANCOMYCIN <=0.5 SENSITIVE Sensitive     TRIMETH/SULFA <=10 SENSITIVE Sensitive     CLINDAMYCIN <=0.25 SENSITIVE Sensitive     RIFAMPIN <=0.5 SENSITIVE Sensitive     Inducible Clindamycin NEGATIVE Sensitive     * ABUNDANT STAPHYLOCOCCUS AUREUS  Susceptibility, Aer + Anaerob     Status: Abnormal   Collection Time: 04/06/22  9:47 AM  Result Value Ref Range Status   Suscept, Aer + Anaerob Final report (A)  Corrected    Comment: (NOTE) Performed At: [facility] [employer] [address] [place], [place] [id number] Rush Farmer MD [id number] CORRECTED ON 12/14 AT 0537: PREVIOUSLY REPORTED AS Preliminary report    Source of Sample Acinetobacter.junii left leg wound  Final    Comment: Performed at [facility], [place] [address] [place], [place] [address]  Susceptibility Result     Status: Abnormal   Collection Time: 04/06/22  9:47 AM  Result Value Ref Range Status   Suscept Result 1 Acinetobacter junii (A)  Final    Comment: (NOTE) Identification performed by account, not confirmed by this laboratory.    Antimicrobial Suscept Comment  Corrected    Comment: (NOTE)      ** S = Susceptible; I = Intermediate; R = Resistant **                   P = Positive; N = Negative            MICS are expressed in micrograms per mL   Antibiotic                 RSLT#1    RSLT#2  RSLT#3    RSLT#4 Amikacin                       S Ampicillin/Sulbactam           S Cefepime                       S Cefotaxime                     S Ceftazidime                    S Ceftriaxone                    S Ciprofloxacin                  S Gentamicin                     S Levofloxacin                   S Meropenem                      S Piperacillin/Tazobactam         S Ticarcillin/Clavulanate        S Tobramycin                     S Trimethoprim/Sulfa             R Performed At: [facility] [employer] [place], [place] [id number] Rush Farmer MD [id number]   Culture, blood (Routine X 2) w Reflex to ID Panel     Status: None   Collection Time: 04/07/22  1:20 PM   Specimen: BLOOD RIGHT FOREARM  Result Value Ref Range Status   Specimen Description BLOOD RIGHT FOREARM  Final   Special Requests   Final    BOTTLES DRAWN AEROBIC AND ANAEROBIC Blood Culture results may not be optimal due to an inadequate volume of blood received in culture bottles   Culture   Final    NO GROWTH 5 DAYS Performed at [facility], [place] [address], [place], [place] [address]    Report Status 04/12/2022 FINAL  Final  Culture, blood (Routine X 2) w Reflex to ID Panel     Status: None   Collection Time: 04/07/22  1:29 PM   Specimen: BLOOD RIGHT FOREARM  Result Value Ref Range Status   Specimen Description BLOOD RIGHT FOREARM  Final   Special Requests   Final    BOTTLES DRAWN AEROBIC AND ANAEROBIC Blood Culture results may not be optimal due to an inadequate volume of blood received in culture bottles   Culture   Final    NO GROWTH 5 DAYS Performed at [facility], [place] [address], [place], [place] [address]    Report Status 04/12/2022 FINAL  Final    Labs: CBC: Recent Labs  Lab 04/07/22 1727 04/08/22 0437 04/11/22 0438  WBC 11.9* 10.2 10.1  NEUTROABS  --  8.1*  --   HGB 12.8* 12.6* 12.1*  HCT 37.3* 36.5* 34.4*  MCV 95.2 95.3 93.7  PLT 153 148* 168   Basic Metabolic Panel: Recent Labs  Lab 04/07/22 1727 04/08/22 0437 04/11/22 0438 04/12/22 0513  NA 133* 133* 133*  --   K 3.7 3.7 4.3  --   CL 101 99 102  --  CO2 _0 --   GLUCOSE 106* 117* 120*  --   BUN _1 --   CREATININE 0.92 0.90 0.84 0.84  CALCIUM 8.1* 8.3* 8.2*  --   MG  --   --  1.9  --    Liver Function Tests: Recent Labs  Lab 04/08/22 0437  AST  30  ALT 33  ALKPHOS 57  BILITOT 0.6  PROT 5.3*  ALBUMIN 2.6*   CBG: Recent Labs  Lab 04/08/22 0843 04/08/22 1204 04/09/22 0903 04/09/22 1237 04/11/22 0737  GLUCAP 113* 107* 131* 126* 115*    Discharge time spent: approximately 35 minutes spent on discharge counseling, evaluation of patient on day of discharge, and coordination of discharge planning with nursing, social work, pharmacy and case management  Signed: Edwin Dada, MD [facility] 04/14/2022

## 2022-04-15 ENCOUNTER — Ambulatory Visit (HOSPITAL_BASED_OUTPATIENT_CLINIC_OR_DEPARTMENT_OTHER): Payer: Self-pay | Admitting: Physical Therapy

## 2022-04-15 ENCOUNTER — Ambulatory Visit: Payer: BC Managed Care – PPO | Admitting: Family Medicine

## 2022-04-18 ENCOUNTER — Ambulatory Visit (HOSPITAL_BASED_OUTPATIENT_CLINIC_OR_DEPARTMENT_OTHER): Payer: Self-pay | Admitting: Physical Therapy

## 2022-04-20 ENCOUNTER — Ambulatory Visit (HOSPITAL_BASED_OUTPATIENT_CLINIC_OR_DEPARTMENT_OTHER): Payer: Self-pay | Admitting: Physical Therapy

## 2022-04-20 NOTE — Progress Notes (Deleted)
NEUROLOGY FOLLOW UP OFFICE NOTE  Melvin [name] [id number]  Subjective:  Melvin [name] is a 76 y.o. year old male with a history of  left leg cellulitis (01/2022), OA, low back pain, prostate cancer, scoliosis who we last saw on 04/05/22.  To briefly review: Patient has had difficulty with balance and stability. Per wife, she first noticed he was slow and difficulty walking in spring. He was walking without a cane at that time though. 02/15/22 patient was outside with dog in back deck. He slipped off the steps and into the leg and scraped the left leg in 3 places. He developed cellulitis in left leg. He has been on antibiotics (he continues to be on doxycycline). This has been patient's only fall. He walking seems worse in the morning and improves as the day goes on per wife. He freezes while walking occasionally.   He denies weakness in arms. He has had tremor that also started in the spring (left hand). Wife notices it more at rest.   He has not noticed any twitching or cramps.   He endorses low back pain.   Wife has also noticed some confusion. She mentions being on vacation in [place]. He came out of the bathroom and didn't know where he was. This lasted about 5 minutes. Wife feels like it occurs more at rest.   He endorses poor vision with multiple surgeries. He denies diplopia or ptosis. Per wife, the eyelids have always been asymmetric. He denies difficulties swallowing or chewing.   Wife has noticed a slight change in voice lately but more since his leg infection and being tired.   Patient sleeps well at night. He does not snore per wife. He takes one nap/or just laying down for about 20 minutes per day.    Patient endorses some poor mood due to poor health over the last year. He feels down at times.   Patient is currently getting home therapy. He also does the home exercises. He has been doing therapy since just before Thanksgiving and feels like it may be helping.   Patient  denies significant weight changes.   Of note, patient takes a B complex and multivitamin most days.   EtOH use: None  Restrictive diet? No Family history of neuropathy/myopathy/NM disease? No  Most recent Assessment and Plan (04/05/22): Patient's history and examination are most consistent with parkinsonism, perhaps idiopathic Parkinson's disease. I discussed the concern and natural history with patient today. I will evaluate for potential mimics as below. Patient elected to trial Sinemet for response as discussion.   PLAN: -Blood work: B12, B1, copper, vit E -MRI brain and cervical spine wo contrast -Sinemet trial: 1/2 tablet TID -Continue home PT  Since their last visit: After our clinic visit on 04/05/22, patient developed rigors, fever, and weakness. He was unable to walk, so EMS was called and brought patient to [facility]. Patient was admitted from 04/05/22-04/14/22 for sepsis due to group B strep (source felt to be left leg infection). Patient had hallucinations while in hospital, likely secondary to delirium, but he had also started Sinemet, so there was concern this could be contributing. After Sinemet was held, the hallucinations resolved but his generalized weakness persisted. CT head and MRI brain showed cerebral atrophy and ventriculomegaly. MRI cervical spine showed chronic spondylosis with moderate to severe left foraminal stenosis at C6-7. Patient was discharged to rehab to finish abx with amoxicillin. ***  MEDICATIONS:  Outpatient Encounter Medications as of 05/03/2022  Medication Sig  acetaminophen (TYLENOL) 500 MG tablet Take 500 mg by mouth every 6 (six) hours as needed for moderate pain.   amoxicillin (AMOXIL) 500 MG tablet Take 1 tablet (500 mg total) by mouth in the morning, at noon, and at bedtime for 7 days.   b complex vitamins capsule Take 1 capsule by mouth daily.   cimetidine (TAGAMET) 200 MG tablet Take 200 mg by mouth 2 (two) times daily.   dorzolamidel-timolol  (COSOPT PF) 22.3-6.8 MG/ML SOLN ophthalmic solution Place 1 drop into both eyes 2 (two) times daily.   EDEX 20 MCG injection 20 mcg by Intracavitary route as needed for erectile dysfunction.   ibuprofen (ADVIL) 200 MG tablet Take 200 mg by mouth every 8 (eight) hours as needed for moderate pain.   linaclotide (LINZESS) 290 MCG CAPS capsule Take 1 capsule (290 mcg total) by mouth daily as needed (constipation).   Multiple Vitamin (MULTIVITAMIN WITH MINERALS) TABS tablet Take 1 tablet by mouth daily with lunch.   phenylephrine (NEO-SYNEPHRINE) 1 % nasal spray Place 1 drop into both nostrils as needed for congestion.   Tafluprost, PF, (ZIOPTAN) 0.0015 % SOLN Place 1 drop into both eyes at bedtime.   traZODone (DESYREL) 50 MG tablet Take 0.5 tablets (25 mg total) by mouth at bedtime as needed for sleep.   No facility-administered encounter medications on file as of 05/03/2022.    PAST MEDICAL HISTORY: Past Medical History:  Diagnosis Date   Arthritis    Cancer [facility])     PAST SURGICAL HISTORY: Past Surgical History:  Procedure Laterality Date   INGUINAL HERNIA REPAIR Right 04/01/2021   Procedure: LAPAROSCOPIC INGUINAL HERNIA;  Surgeon: Ralene Ok, MD;  Location: [place];  Service: General;  Laterality: Right;   INGUINAL HERNIA REPAIR Left 07/09/2021   Procedure: OPEN LEFT INGUINAL HERNIA REPAIR WITH MESH;  Surgeon: Ralene Ok, MD;  Location: [place];  Service: General;  Laterality: Left;   INSERTION OF MESH Right 04/01/2021   Procedure: INSERTION OF MESH;  Surgeon: Ralene Ok, MD;  Location: [place];  Service: General;  Laterality: Right;   INSERTION OF MESH Left 07/09/2021   Procedure: INSERTION OF MESH;  Surgeon: Ralene Ok, MD;  Location: [place];  Service: General;  Laterality: Left;   LYMPHADENECTOMY Bilateral 09/12/2012   Procedure: LYMPHADENECTOMY;  Surgeon: Alexis Frock, MD;  Location: [facility];  Service: Urology;  Laterality: Bilateral;   ROBOT ASSISTED LAPAROSCOPIC  RADICAL PROSTATECTOMY N/A 09/12/2012   Procedure: ROBOTIC ASSISTED LAPAROSCOPIC RADICAL PROSTATECTOMY;  Surgeon: Alexis Frock, MD;  Location: [facility];  Service: Urology;  Laterality: N/A;   TONSILLECTOMY      ALLERGIES: Allergies  Allergen Reactions   Sulfa Antibiotics Hives    FAMILY HISTORY: No family history on file.  SOCIAL HISTORY: Social History   Tobacco Use   Smoking status: Former    Types: Cigarettes    Quit date: 09/12/1992    Years since quitting: 29.6   Smokeless tobacco: Never  Vaping Use   Vaping Use: Never used  Substance Use Topics   Alcohol use: Not Currently   Drug use: Never   Social History   Social History Narrative   Are you right handed or left handed? Right   Are you currently employed ? [employer]    What is your current occupation?   Do you live at home alone? wife   Who lives with you?    What type of home do you live in: 1 story or 2 story? Two but don't  go up          Objective:  Vital Signs:  There were no vitals taken for this visit.  ***  Labs and Imaging review: New results: 04/05/22: Normal or unremarkable: vit E, copper, B12 (485), B1  04/07/22: ESR: 34 CRP: 23.1 Ammonia: 47  MRI brain wo contrast (04/08/22): FINDINGS: A coronal T2 sequence is not obtained as the patient was unable to tolerate the complete examination.   Brain: There is no evidence of an acute infarct, mass, midline shift, or extra-axial fluid collection. Patchy T2 hyperintensities in the cerebral white matter and pons are nonspecific but compatible with moderate chronic small vessel ischemic disease. An asymmetric 2 cm region of T2 hyperintensity/gliosis is noted in the subcortical white matter of the posterolateral left frontal lobe with some associated curvilinear susceptibility which may reflect chronic blood products or vessels/possible developmental venous anomaly. 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    Lateral ventriculomegaly is again noted, including prominent enlargement of the left greater than right atria and occipital horns with surrounding white matter volume loss. The temporal horns are only mildly enlarged. The third ventricle and cerebral sulci are mildly enlarged.   Vascular: Major intracranial vascular flow voids are preserved.   Skull and upper cervical spine: Unremarkable bone marrow signal para   Sinuses/Orbits: Bilateral cataract extraction. Clear paranasal sinuses. Small right mastoid effusion.   Other: None.   IMPRESSION: 1. No acute intracranial abnormality. 2. Moderate chronic small vessel ischemic disease. 3. Ventriculomegaly which is favored to reflect advanced central predominant cerebral atrophy rather than hydrocephalus  MRI cervical spine wo contrast (04/08/22): FINDINGS: Despite efforts by the technologist and patient, mild motion artifact is present on today's exam and could not be eliminated. This reduces exam sensitivity and specificity.   Alignment: Straightening with a slight retrolisthesis at the C5-6 and C6-7 levels.   Vertebrae: No acute or suspicious osseous findings. Mild endplate degenerative changes at C5-6 and C6-7.   Cord: Normal in signal and caliber.   Posterior Fossa, vertebral arteries, paraspinal tissues: Intracranial findings are dictated separately. The visualized posterior fossa appears unremarkable.Bilateral vertebral artery flow voids. No significant paraspinal findings.   Disc levels:   Details of the individual disc space levels are partly obscured by motion artifact.   C2-3: Normal interspace.   C3-4: Asymmetric uncinate spurring and facet hypertrophy on the right contributing to mild to moderate right foraminal narrowing. No cord deformity. The left foramen appears widely patent.   C4-5: Preserved disc height. Mild facet hypertrophy, right greater than left. No spinal stenosis or nerve root encroachment.    C5-6: Chronic loss of disc height with posterior osteophytes and bilateral uncinate spurring. The CSF surrounding the cord is partially effaced without cord deformity. Moderate foraminal narrowing bilaterally which appears chronic.   C6-7: Chronic loss of disc height with posterior osteophytes and bilateral uncinate spurring. Mild right and moderate to severe left foraminal narrowing which appears chronic. No cord deformity.   C7-T1: Normal interspace.   IMPRESSION: 1. No acute findings or clear explanation for the patient's symptoms. 2. 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 3. Mild to moderate right foraminal narrowing at C3-4 due to uncinate spurring and facet hypertrophy. 4. No cord deformity or abnormal cord signal.  Previously reviewed results: Internal labs: Normal or unremarkable (01/2022): CBC, CMP, TSH HbA1c (02/15/22): 5.7  MRI lumbar spine wo contrast (04/14/21): FINDINGS: Segmentation:  Standard.   Alignment: Unchanged levoscoliosis. Mild retrolisthesis at L1-L2, L2-L3, and L3-L4.   Vertebrae: No fracture, evidence of discitis, or bone lesion. Asymmetric degenerative endplate marrow edema at L1-L2 and L3-L4.   Conus medullaris and cauda equina: Conus extends to the L1 level. Conus and cauda equina appear normal.   Paraspinal and other soft tissues: Negative.   Disc levels:   T12-L1: Minimal disc bulging. Mild bilateral facet arthropathy. No stenosis.   L1-L2: Circumferential disc osteophyte complex asymmetric to the left. Mild bilateral facet arthropathy. Mild left lateral recess and bilateral neuroforaminal stenosis. No spinal canal stenosis.   L2-L3: Bulky right far lateral disc osteophyte complex. Right-greater-than-left facet arthropathy. Mild right lateral recess stenosis. Moderate right neuroforaminal  stenosis. No spinal canal or left neuroforaminal stenosis.   L3-L4: Right far lateral disc osteophyte complex. Mild bilateral facet arthropathy. Mild right lateral recess and bilateral neuroforaminal stenosis. No spinal canal stenosis.   L4-L5: Right-sided disc uncovering and disc bulging extending into the right neural foramen. Severe bilateral facet arthropathy. Moderate spinal canal and severe bilateral lateral recess stenosis. Moderate to severe right and moderate left neuroforaminal stenosis.   L5-S1: Small posterior and left far lateral disc osteophyte complexes. Severe right and mild left facet arthropathy. Moderate left and mild right lateral recess stenosis. Moderate to severe left neuroforaminal stenosis. No spinal canal or right neuroforaminal stenosis.   IMPRESSION: 1. Advanced multilevel degenerative changes of the lumbar spine as described above, worst at L4-L5.  Assessment/Plan:  This is Melvin [name], a 76 y.o. 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 ***Sinemet?  Return to clinic in ***  Total time spent reviewing records, interview, history/exam, documentation, and coordination of care on day of encounter:  *** min  Kai Levins, MD

## 2022-04-22 LAB — AEROBIC CULTURE W GRAM STAIN (SUPERFICIAL SPECIMEN): Gram Stain: NONE SEEN

## 2022-05-03 ENCOUNTER — Ambulatory Visit: Payer: BC Managed Care – PPO | Admitting: Neurology

## 2022-05-11 ENCOUNTER — Ambulatory Visit: Payer: BC Managed Care – PPO | Admitting: Neurology

## 2022-05-11 ENCOUNTER — Telehealth: Payer: Self-pay

## 2022-05-11 NOTE — Telephone Encounter (Signed)
Transition Care Management Unsuccessful Follow-up Telephone Call  Date of discharge and from where:  [facility] 05/10/2022  Attempts:  1st Attempt  Reason for unsuccessful TCM follow-up call:  Left voice message Juanda Crumble, [place] Direct Dial [phone]

## 2022-05-11 NOTE — Progress Notes (Deleted)
NEUROLOGY FOLLOW UP OFFICE NOTE  Melvin [name] LC:[id number]  Subjective:  Melvin [name] is a 77 y.o. year old left leg cellulitis (01/2022), OA, low back pain, prostate cancer, scoliosis who we last saw on 04/05/22.  To briefly review: Patient has had difficulty with balance and stability. Per wife, she first noticed he was slow and difficulty walking in spring. He was walking without a cane at that time though. 02/15/22 patient was outside with dog in back deck. He slipped off the steps and into the leg and scraped the left leg in 3 places. He developed cellulitis in left leg. He has been on antibiotics (he continues to be on doxycycline). This has been patient's only fall. He walking seems worse in the morning and improves as the day goes on per wife. He freezes while walking occasionally.   He denies weakness in arms. He has had tremor that also started in the spring (left hand). Wife notices it more at rest.   He has not noticed any twitching or cramps.   He endorses low back pain.   Wife has also noticed some confusion. She mentions being on vacation in [place]. He came out of the bathroom and didn't know where he was. This lasted about 5 minutes. Wife feels like it occurs more at rest.   He endorses poor vision with multiple surgeries. He denies diplopia or ptosis. Per wife, the eyelids have always been asymmetric. He denies difficulties swallowing or chewing.   Wife has noticed a slight change in voice lately but more since his leg infection and being tired.   Patient sleeps well at night. He does not snore per wife. He takes one nap/or just laying down for about 20 minutes per day.    Patient endorses some poor mood due to poor health over the last year. He feels down at times.   Patient is currently getting home therapy. He also does the home exercises. He has been doing therapy since just before Thanksgiving and feels like it may be helping.   Patient denies significant weight  changes.   Of note, patient takes a B complex and multivitamin most days.   EtOH use: None  Restrictive diet? No Family history of neuropathy/myopathy/NM disease? No  Most recent Assessment and Plan (04/05/22): Melvin [name] is a 78 y.o. male who presents for evaluation of gait imbalance. He has a relevant medical history of left leg cellulitis (01/2022), OA, low back pain, prostate cancer, scoliosis. His neurological examination is pertinent for bradykinesia, rest tremor, rigidity of all extremities, gait imbalance, en-bloc turns. Available diagnostic data is significant for MRI lumbar spine showing significant lumbosacral stenosis and neural foraminal stenosis. Patient's history and examination are most consistent with parkinsonism, perhaps idiopathic Parkinson's disease. I discussed the concern and natural history with patient today. I will evaluate for potential mimics as below. Patient elected to trial Sinemet for response as discussion.   PLAN: -Blood work: B12, B1, copper, vit E -MRI brain and cervical spine wo contrast -Sinemet trial: 1/2 tablet TID -Continue home PT  Since their last visit: B12, B1, copper, and vit E were unremarkable. After patient's clinic visit on 04/05/22, he developed rigors and fever later in the day and presented to [facility]. Patient was found to be in sepsis due to group B strep from left leg cellulitis. He was admitted from 04/05/22-04/14/22. His hospitalization was complicated by hallucinations. There was concern that this was due to starting Sinemet. MRI brain showed no acute process,  atrophy, and ventriculomegaly. MRI cervical spine showed mild spinal stenosis, moderate b/l C5-6 NF stenosis, moderate to severe left NF stenosis. Patient was discharged to acute rehab where he stayed until ***.  Urinary problems?*** ***  MEDICATIONS:  Outpatient Encounter Medications as of 05/18/2022  Medication Sig   acetaminophen (TYLENOL) 500 MG tablet Take 500 mg by mouth every  6 (six) hours as needed for moderate pain.   b complex vitamins capsule Take 1 capsule by mouth daily.   cimetidine (TAGAMET) 200 MG tablet Take 200 mg by mouth 2 (two) times daily.   dorzolamidel-timolol (COSOPT PF) 22.3-6.8 MG/ML SOLN ophthalmic solution Place 1 drop into both eyes 2 (two) times daily.   EDEX 20 MCG injection 20 mcg by Intracavitary route as needed for erectile dysfunction.   ibuprofen (ADVIL) 200 MG tablet Take 200 mg by mouth every 8 (eight) hours as needed for moderate pain.   linaclotide (LINZESS) 290 MCG CAPS capsule Take 1 capsule (290 mcg total) by mouth daily as needed (constipation).   Multiple Vitamin (MULTIVITAMIN WITH MINERALS) TABS tablet Take 1 tablet by mouth daily with lunch.   phenylephrine (NEO-SYNEPHRINE) 1 % nasal spray Place 1 drop into both nostrils as needed for congestion.   Tafluprost, PF, (ZIOPTAN) 0.0015 % SOLN Place 1 drop into both eyes at bedtime.   traZODone (DESYREL) 50 MG tablet Take 0.5 tablets (25 mg total) by mouth at bedtime as needed for sleep.   No facility-administered encounter medications on file as of 05/18/2022.    PAST MEDICAL HISTORY: Past Medical History:  Diagnosis Date   Arthritis    Cancer [facility])     PAST SURGICAL HISTORY: Past Surgical History:  Procedure Laterality Date   INGUINAL HERNIA REPAIR Right 04/01/2021   Procedure: LAPAROSCOPIC INGUINAL HERNIA;  Surgeon: Ralene Ok, MD;  Location: [place];  Service: General;  Laterality: Right;   INGUINAL HERNIA REPAIR Left 07/09/2021   Procedure: OPEN LEFT INGUINAL HERNIA REPAIR WITH MESH;  Surgeon: Ralene Ok, MD;  Location: [place];  Service: General;  Laterality: Left;   INSERTION OF MESH Right 04/01/2021   Procedure: INSERTION OF MESH;  Surgeon: Ralene Ok, MD;  Location: [place];  Service: General;  Laterality: Right;   INSERTION OF MESH Left 07/09/2021   Procedure: INSERTION OF MESH;  Surgeon: Ralene Ok, MD;  Location: [place];  Service: General;   Laterality: Left;   LYMPHADENECTOMY Bilateral 09/12/2012   Procedure: LYMPHADENECTOMY;  Surgeon: Alexis Frock, MD;  Location: [facility];  Service: Urology;  Laterality: Bilateral;   ROBOT ASSISTED LAPAROSCOPIC RADICAL PROSTATECTOMY N/A 09/12/2012   Procedure: ROBOTIC ASSISTED LAPAROSCOPIC RADICAL PROSTATECTOMY;  Surgeon: Alexis Frock, MD;  Location: [facility];  Service: Urology;  Laterality: N/A;   TONSILLECTOMY      ALLERGIES: Allergies  Allergen Reactions   Sulfa Antibiotics Hives    FAMILY HISTORY: No family history on file.  SOCIAL HISTORY: Social History   Tobacco Use   Smoking status: Former    Types: Cigarettes    Quit date: 09/12/1992    Years since quitting: 29.6   Smokeless tobacco: Never  Vaping Use   Vaping Use: Never used  Substance Use Topics   Alcohol use: Not Currently   Drug use: Never   Social History   Social History Narrative   Are you right handed or left handed? Right   Are you currently employed ? [employer]     What is your current occupation?   Do you live at home alone? wife  Who lives with you?    What type of home do you live in: 1 story or 2 story? Two but don't go up          Objective:  Vital Signs:  There were no vitals taken for this visit.  ***  Labs and Imaging review: New results: 04/05/22: Normal or unremarkable: vit E, copper, B1, B12 (485)  Ammonia (04/07/22): elevated to 47  MRI brain wo contrast (04/08/22): Per my review: Significant diffuse atrophy. Asymmetry of lateral ventricles (Left larger than right).  Evans index: 0.25 (0.20-0.25 normal; 0.25-.030 possible or early ventriculomegaly)  Radiology read: FINDINGS: A coronal T2 sequence is not obtained as the patient was unable to tolerate the complete examination.   Brain: There is no evidence of an acute infarct, mass, midline shift, or extra-axial fluid collection. Patchy T2 hyperintensities in the cerebral white matter and pons are nonspecific but  compatible with moderate chronic small vessel ischemic disease. An asymmetric 2 cm region of T2 hyperintensity/gliosis is noted in the subcortical white matter of the posterolateral left frontal lobe with some associated curvilinear susceptibility which may reflect chronic blood products or vessels/possible developmental venous anomaly. 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   Lateral ventriculomegaly is again noted, including prominent enlargement of the left greater than right atria and occipital horns with surrounding white matter volume loss. The temporal horns are only mildly enlarged. The third ventricle and cerebral sulci are mildly enlarged.   Vascular: Major intracranial vascular flow voids are preserved.   Skull and upper cervical spine: Unremarkable bone marrow signal para   Sinuses/Orbits: Bilateral cataract extraction. Clear paranasal sinuses. Small right mastoid effusion.   Other: None.   IMPRESSION: 1. No acute intracranial abnormality. 2. Moderate chronic small vessel ischemic disease. 3. Ventriculomegaly which is favored to reflect advanced central predominant cerebral atrophy rather than hydrocephalus.  MRI cervical spine wo contrast (04/08/22): FINDINGS: Despite efforts by the technologist and patient, mild motion artifact is present on today's exam and could not be eliminated. This reduces exam sensitivity and specificity.   Alignment: Straightening with a slight retrolisthesis at the C5-6 and C6-7 levels.   Vertebrae: No acute or suspicious osseous findings. Mild endplate degenerative changes at C5-6 and C6-7.   Cord: Normal in signal and caliber.   Posterior Fossa, vertebral arteries, paraspinal tissues: Intracranial findings are dictated separately. The visualized posterior fossa appears unremarkable.Bilateral vertebral artery flow voids. No significant paraspinal findings.   Disc levels:   Details of the individual  disc space levels are partly obscured by motion artifact.   C2-3: Normal interspace.   C3-4: Asymmetric uncinate spurring and facet hypertrophy on the right contributing to mild to moderate right foraminal narrowing. No cord deformity. The left foramen appears widely patent.   C4-5: Preserved disc height. Mild facet hypertrophy, right greater than left. No spinal stenosis or nerve root encroachment.   C5-6: Chronic loss of disc height with posterior osteophytes and bilateral uncinate spurring. The CSF surrounding the cord is partially effaced without cord deformity. Moderate foraminal narrowing bilaterally which appears chronic.   C6-7: Chronic loss of disc height with posterior osteophytes and bilateral uncinate spurring. Mild right and moderate to severe left foraminal narrowing which appears chronic. No cord deformity.   C7-T1: Normal interspace.   IMPRESSION: 1. No acute findings or clear explanation for the patient's symptoms. 2. Chronic spondylosis at C5-6 and C6-7 contributing to mild spinal stenosis and moderate foraminal narrowing bilaterally at C5-6 and moderate  to severe left foraminal narrowing at C6-7. 3. Mild to moderate right foraminal narrowing at C3-4 due to uncinate spurring and facet hypertrophy. 4. No cord deformity or abnormal cord signal.  Previously reviewed results: Internal labs: Normal or unremarkable (01/2022): CBC, CMP, TSH HbA1c (02/15/22): 5.7   MRI lumbar spine wo contrast (04/14/21): FINDINGS: Segmentation:  Standard.   Alignment: Unchanged levoscoliosis. Mild retrolisthesis at L1-L2, L2-L3, and L3-L4.   Vertebrae: No fracture, evidence of discitis, or bone lesion. Asymmetric degenerative endplate marrow edema at L1-L2 and L3-L4.   Conus medullaris and cauda equina: Conus extends to the L1 level. Conus and cauda equina appear normal.   Paraspinal and other soft tissues: Negative.   Disc levels:   T12-L1: Minimal disc bulging. Mild  bilateral facet arthropathy. No stenosis.   L1-L2: Circumferential disc osteophyte complex asymmetric to the left. Mild bilateral facet arthropathy. Mild left lateral recess and bilateral neuroforaminal stenosis. No spinal canal stenosis.   L2-L3: Bulky right far lateral disc osteophyte complex. Right-greater-than-left facet arthropathy. Mild right lateral recess stenosis. Moderate right neuroforaminal stenosis. No spinal canal or left neuroforaminal stenosis.   L3-L4: Right far lateral disc osteophyte complex. Mild bilateral facet arthropathy. Mild right lateral recess and bilateral neuroforaminal stenosis. No spinal canal stenosis.   L4-L5: Right-sided disc uncovering and disc bulging extending into the right neural foramen. Severe bilateral facet arthropathy. Moderate spinal canal and severe bilateral lateral recess stenosis. Moderate to severe right and moderate left neuroforaminal stenosis.   L5-S1: Small posterior and left far lateral disc osteophyte complexes. Severe right and mild left facet arthropathy. Moderate left and mild right lateral recess stenosis. Moderate to severe left neuroforaminal stenosis. No spinal canal or right neuroforaminal stenosis.   IMPRESSION: 1. Advanced multilevel degenerative changes of the lumbar spine as described above, worst at L4-L5.  Assessment/Plan:  This is Melvin [name], a 77 y.o. male with: ***   Plan: ***LP with PT for NPH work up? ***Sinemet?  Return to clinic in ***  Total time spent reviewing records, interview, history/exam, documentation, and coordination of care on day of encounter:  *** min  Kai Levins, MD

## 2022-05-12 ENCOUNTER — Encounter: Payer: Self-pay | Admitting: Internal Medicine

## 2022-05-12 NOTE — Patient Instructions (Addendum)
Medications changes include :   none.  Use tylenol for the pain.     Let me know if a nurse does not contact you.     A study was ordered to evaluate your circulation.

## 2022-05-12 NOTE — Telephone Encounter (Signed)
Transition Care Management Unsuccessful Follow-up Telephone Call  Date of discharge and from where:  [facility] 05/10/2022  Attempts:  2nd Attempt  Reason for unsuccessful TCM follow-up call:  Left voice message Juanda Crumble, [place] Direct Dial [phone]

## 2022-05-12 NOTE — Progress Notes (Signed)
Subjective:    Patient ID: Melvin [name], male    DOB: [birth year], 77 y.o.   MRN: [id number]     HPI Melvin [name] is here for follow up from the hospital.  He is here today with his wife.  Admitted 12/5 - 12/14 and then was in rehab until 1/9.  Went to ED for rigors and profound weakness, fever.  Left shin still with cellulitis and he was septic.  Started on vac and rocephin.  Found to have group B streptococcus.  Had 2 week IV PCN in hospital and changed to amoxicillin at discharge  Wound was slow to heal.  Advised ABI as outpatient.   Unsteady gait, left hand tremor- saw neuro just before hospitalization - started on sinemet.    Hallucination - neuro saw pt in hospital. mri brain, c-spine negative.  Had some hallucinations from hospital delirium exacerbated by sinemet in setting of underlying neurodegenerative dz. Sinement was held.  No hallucinations in rehab or since being home.  Neuro advised if not progressing needs more rapid f/u with neuro outpt.  Has follow-up 1/17.  Was placed on amlodipine while in the hospital because his blood pressure was high- not on it x few days - BP good at home - never had BP issues.  And wanted to know if he could officially stop it.  Chornic spine pain.  Was taking ibuprofen and rehab-they wonder what he should be taking on a regular basis that is safe   To have home PT and nursing come to home.    Dec ROM left shoulder - some weakness.  Has not had the tremor since the hospital in his left hand.    Medications and allergies reviewed with patient and updated if appropriate.  Current Outpatient Medications on File Prior to Visit  Medication Sig Dispense Refill   acetaminophen (TYLENOL) 500 MG tablet Take 500 mg by mouth every 6 (six) hours as needed for moderate pain.     b complex vitamins capsule Take 1 capsule by mouth daily.     cimetidine (TAGAMET) 200 MG tablet Take 200 mg by mouth 2 (two) times daily.     dorzolamidel-timolol (COSOPT  PF) 22.3-6.8 MG/ML SOLN ophthalmic solution Place 1 drop into both eyes 2 (two) times daily.     EDEX 20 MCG injection 20 mcg by Intracavitary route as needed for erectile dysfunction.     famotidine (PEPCID) 20 MG tablet Take 10 mg by mouth 2 (two) times daily.     ibuprofen (ADVIL) 200 MG tablet Take 200 mg by mouth every 8 (eight) hours as needed for moderate pain.     linaclotide (LINZESS) 290 MCG CAPS capsule Take 1 capsule (290 mcg total) by mouth daily as needed (constipation). 30 capsule 4   Multiple Vitamin (MULTIVITAMIN WITH MINERALS) TABS tablet Take 1 tablet by mouth daily with lunch.     PAXLOVID, 300/100, 20 x 150 MG & 10 x '100MG'$  TBPK Take 3 tablets by mouth 2 (two) times daily.     phenylephrine (NEO-SYNEPHRINE) 1 % nasal spray Place 1 drop into both nostrils as needed for congestion.     Tafluprost, PF, (ZIOPTAN) 0.0015 % SOLN Place 1 drop into both eyes at bedtime.     traZODone (DESYREL) 50 MG tablet Take 0.5 tablets (25 mg total) by mouth at bedtime as needed for sleep.     No current facility-administered medications on file prior to visit.     Review of Systems  Constitutional:  Positive for appetite change (slightly dec). Negative for chills and fever.  Respiratory:  Negative for cough, shortness of breath and wheezing.   Cardiovascular:  Negative for chest pain, palpitations and leg swelling.  Neurological:  Negative for light-headedness and headaches.       Objective:   Vitals:   05/13/22 1315  BP: 124/72  Pulse: 70  Temp: 98.2 F (36.8 C)  SpO2: 99%   BP Readings from Last 3 Encounters:  05/13/22 124/72  04/14/22 (!) 154/71  04/05/22 106/78   Wt Readings from Last 3 Encounters:  04/05/22 173 lb 12.6 oz (78.8 kg)  04/05/22 173 lb 12.8 oz (78.8 kg)  03/31/22 175 lb (79.4 kg)   Body mass index is 22.93 kg/m.    Physical Exam Constitutional:      General: He is not in acute distress.    Appearance: He is not ill-appearing.     Comments:  Frail-appearing  HENT:     Head: Normocephalic and atraumatic.  Eyes:     Conjunctiva/sclera: Conjunctivae normal.  Cardiovascular:     Rate and Rhythm: Normal rate and regular rhythm.     Heart sounds: Normal heart sounds. No murmur heard. Pulmonary:     Effort: Pulmonary effort is normal. No respiratory distress.     Breath sounds: Normal breath sounds. No wheezing or rales.  Musculoskeletal:        General: Deformity (Scoliosis) present.     Right lower leg: No edema.     Left lower leg: No edema.  Skin:    General: Skin is warm and dry.     Findings: No rash.     Comments: Healed scrape left knee, scab left anterior lower distal leg from prior injury, proximal anterior lower leg with healing laceration-without evidence of infection-no erythema, no open wound, not warm, nontender  Neurological:     Mental Status: He is alert. Mental status is at baseline.  Psychiatric:        Mood and Affect: Mood normal.        Lab Results  Component Value Date   WBC 10.1 04/11/2022   HGB 12.1 (L) 04/11/2022   HCT 34.4 (L) 04/11/2022   PLT 232 04/11/2022   GLUCOSE 120 (H) 04/11/2022   CHOL 163 02/15/2022   TRIG 52.0 02/15/2022   HDL 67.90 02/15/2022   LDLCALC 85 02/15/2022   ALT 33 04/08/2022   AST 30 04/08/2022   NA 133 (L) 04/11/2022   K 4.3 04/11/2022   CL 102 04/11/2022   CREATININE 0.84 04/12/2022   BUN 21 04/11/2022   CO2 22 04/11/2022   TSH 1.51 02/15/2022   HGBA1C 5.6 02/15/2022   MR BRAIN WO CONTRAST CLINICAL DATA:  Hydrocephalus.  EXAM: MRI HEAD WITHOUT CONTRAST  TECHNIQUE: Multiplanar, multiecho pulse sequences of the brain and surrounding structures were obtained without intravenous contrast.  COMPARISON:  Head CT 04/07/2022  FINDINGS: A coronal T2 sequence is not obtained as the patient was unable to tolerate the complete examination.  Brain: There is no evidence of an acute infarct, mass, midline shift, or extra-axial fluid collection. Patchy T2  hyperintensities in the cerebral white matter and pons are nonspecific but compatible with moderate chronic small vessel ischemic disease. An asymmetric 2 cm region of T2 hyperintensity/gliosis is noted in the subcortical white matter of the posterolateral left frontal lobe with some associated curvilinear susceptibility which may reflect chronic blood products or vessels/possible developmental venous anomaly. A chronic lacunar infarct is noted laterally  in the left thalamus.  Lateral ventriculomegaly is again noted, including prominent enlargement of the left greater than right atria and occipital horns with surrounding white matter volume loss. The temporal horns are only mildly enlarged. The third ventricle and cerebral sulci are mildly enlarged.  Vascular: Major intracranial vascular flow voids are preserved.  Skull and upper cervical spine: Unremarkable bone marrow signal para  Sinuses/Orbits: Bilateral cataract extraction. Clear paranasal sinuses. Small right mastoid effusion.  Other: None.  IMPRESSION: 1. No acute intracranial abnormality. 2. Moderate chronic small vessel ischemic disease. 3. Ventriculomegaly which is favored to reflect advanced central predominant cerebral atrophy rather than hydrocephalus.  Electronically Signed   By: Logan Bores M.D.   On: 04/08/2022 14:57 MR CERVICAL SPINE WO CONTRAST CLINICAL DATA:  Ataxia, nontraumatic.  Cervical pathology suspected.  EXAM: MRI CERVICAL SPINE WITHOUT CONTRAST  TECHNIQUE: Multiplanar, multisequence MR imaging of the cervical spine was performed. No intravenous contrast was administered.  COMPARISON:  None Available.  FINDINGS: Despite efforts by the technologist and patient, mild motion artifact is present on today's exam and could not be eliminated. This reduces exam sensitivity and specificity.  Alignment: Straightening with a slight retrolisthesis at the C5-6 and C6-7 levels.  Vertebrae: No acute  or suspicious osseous findings. Mild endplate degenerative changes at C5-6 and C6-7.  Cord: Normal in signal and caliber.  Posterior Fossa, vertebral arteries, paraspinal tissues: Intracranial findings are dictated separately. The visualized posterior fossa appears unremarkable.Bilateral vertebral artery flow voids. No significant paraspinal findings.  Disc levels:  Details of the individual disc space levels are partly obscured by motion artifact.  C2-3: Normal interspace.  C3-4: Asymmetric uncinate spurring and facet hypertrophy on the right contributing to mild to moderate right foraminal narrowing. No cord deformity. The left foramen appears widely patent.  C4-5: Preserved disc height. Mild facet hypertrophy, right greater than left. No spinal stenosis or nerve root encroachment.  C5-6: Chronic loss of disc height with posterior osteophytes and bilateral uncinate spurring. The CSF surrounding the cord is partially effaced without cord deformity. Moderate foraminal narrowing bilaterally which appears chronic.  C6-7: Chronic loss of disc height with posterior osteophytes and bilateral uncinate spurring. Mild right and moderate to severe left foraminal narrowing which appears chronic. No cord deformity.  C7-T1: Normal interspace.  IMPRESSION: 1. No acute findings or clear explanation for the patient's symptoms. 2. 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 3. Mild to moderate right foraminal narrowing at C3-4 due to uncinate spurring and facet hypertrophy. 4. No cord deformity or abnormal cord signal.  Electronically Signed   By: Richardean Sale M.D.   On: 04/08/2022 14:57    Assessment & Plan:    See Problem List for Assessment and Plan of chronic medical problems.

## 2022-05-13 ENCOUNTER — Ambulatory Visit: Payer: BC Managed Care – PPO | Admitting: Internal Medicine

## 2022-05-13 ENCOUNTER — Encounter: Payer: Self-pay | Admitting: Internal Medicine

## 2022-05-13 VITALS — BP 124/72 | HR 70 | Temp 98.2°F | Ht 73.0 in

## 2022-05-13 DIAGNOSIS — R2681 Unsteadiness on feet: Secondary | ICD-10-CM

## 2022-05-13 DIAGNOSIS — R030 Elevated blood-pressure reading, without diagnosis of hypertension: Secondary | ICD-10-CM

## 2022-05-13 DIAGNOSIS — G8929 Other chronic pain: Secondary | ICD-10-CM

## 2022-05-13 DIAGNOSIS — A409 Streptococcal sepsis, unspecified: Secondary | ICD-10-CM

## 2022-05-13 DIAGNOSIS — I739 Peripheral vascular disease, unspecified: Secondary | ICD-10-CM | POA: Diagnosis not present

## 2022-05-13 DIAGNOSIS — Z872 Personal history of diseases of the skin and subcutaneous tissue: Secondary | ICD-10-CM | POA: Diagnosis not present

## 2022-05-13 DIAGNOSIS — L03116 Cellulitis of left lower limb: Secondary | ICD-10-CM

## 2022-05-13 DIAGNOSIS — M5442 Lumbago with sciatica, left side: Secondary | ICD-10-CM

## 2022-05-13 NOTE — Assessment & Plan Note (Signed)
Recent hospitalization for a left lower leg cellulitis that failed outpatient treatment and sepsis Sepsis resolved after antibiotics and supportive treatment

## 2022-05-13 NOTE — Telephone Encounter (Signed)
Transition Care Management Unsuccessful Follow-up Telephone Call  Date of discharge and from where:  [facility] 05/10/2022  Attempts:  3rd Attempt  Reason for unsuccessful TCM follow-up call:  Left voice message Juanda Crumble, [place] Direct Dial [phone]

## 2022-05-13 NOTE — Assessment & Plan Note (Signed)
Chronic Multifactorial Was taking ibuprofen while in rehab-advised not to take that long-term on a daily basis Go back to Tylenol-can take maximum of 3000 mg/day, can also try anything topical to see if that helps Will be starting PT and hopefully improving some of the strength will also help

## 2022-05-13 NOTE — Assessment & Plan Note (Addendum)
Recent hospitalization for cellulitis of left lower leg Resolved Still has area on left proximal lower leg that is healing-looks like a small laceration that is slightly scabbed over-no surrounding erythema, no tenderness, no warmth-no open wound No evidence of infection Continue wound care-nurse should be coming in once a week to help monitor-if they do not hear something the next few days from the nurse they will let me know so I can order Wound rewrapped here today

## 2022-05-13 NOTE — Assessment & Plan Note (Signed)
Decreased pulses lower extremities, poor healing infection left lower extremity resulting in sepsis Likely PAD ABI ordered

## 2022-05-13 NOTE — Assessment & Plan Note (Addendum)
Chronic Likely multifactorial Did see neurology just before hospitalization and there was concern about Parkinson's disease-?  Some those symptoms related to being septic He was placed on Sinemet, but that was held in the hospital MRI brain, C-spine negative Did go to rehab and has home PT now-hopefully to start next week To follow-up with neurology for reevaluation

## 2022-05-13 NOTE — Assessment & Plan Note (Signed)
Had elevated blood pressure in the hospital and was started on amlodipine which improved the blood pressure Never had high blood pressure before in the past His wife did hold the amlodipine for the last couple of days and blood pressure has been well-controlled at home without it Agree with discontinuing amlodipine 5 mg daily Advised her to continue to monitor blood pressure for a while to make sure that it does not spike up-it was likely elevated related to his illness, etc. while in the hospital

## 2022-05-18 ENCOUNTER — Ambulatory Visit: Payer: BC Managed Care – PPO | Admitting: Neurology

## 2022-05-20 ENCOUNTER — Ambulatory Visit (HOSPITAL_COMMUNITY)
Admission: RE | Admit: 2022-05-20 | Discharge: 2022-05-20 | Disposition: A | Discharge status: Home / Self Care | Payer: BC Managed Care – PPO | Source: Ambulatory Visit | Attending: Internal Medicine | Admitting: Internal Medicine

## 2022-05-20 DIAGNOSIS — I739 Peripheral vascular disease, unspecified: Secondary | ICD-10-CM | POA: Insufficient documentation

## 2022-05-20 LAB — VAS US LOWER EXT ART SEG MULTI (SEGMENTALS & LE RAYNAUDS)
Left ABI: 1.14
Right ABI: 1.58

## 2022-05-23 DIAGNOSIS — I739 Peripheral vascular disease, unspecified: Secondary | ICD-10-CM

## 2022-05-23 DIAGNOSIS — H6123 Impacted cerumen, bilateral: Secondary | ICD-10-CM

## 2022-05-23 DIAGNOSIS — J309 Allergic rhinitis, unspecified: Secondary | ICD-10-CM

## 2022-05-23 DIAGNOSIS — R4189 Other symptoms and signs involving cognitive functions and awareness: Secondary | ICD-10-CM

## 2022-05-23 DIAGNOSIS — H409 Unspecified glaucoma: Secondary | ICD-10-CM

## 2022-05-23 DIAGNOSIS — L03116 Cellulitis of left lower limb: Secondary | ICD-10-CM | POA: Diagnosis not present

## 2022-05-23 DIAGNOSIS — A401 Sepsis due to streptococcus, group B: Secondary | ICD-10-CM | POA: Diagnosis not present

## 2022-05-23 DIAGNOSIS — M47816 Spondylosis without myelopathy or radiculopathy, lumbar region: Secondary | ICD-10-CM

## 2022-05-23 DIAGNOSIS — M48061 Spinal stenosis, lumbar region without neurogenic claudication: Secondary | ICD-10-CM | POA: Diagnosis not present

## 2022-05-23 DIAGNOSIS — Z8616 Personal history of COVID-19: Secondary | ICD-10-CM

## 2022-05-23 DIAGNOSIS — I10 Essential (primary) hypertension: Secondary | ICD-10-CM | POA: Diagnosis not present

## 2022-05-23 DIAGNOSIS — M19079 Primary osteoarthritis, unspecified ankle and foot: Secondary | ICD-10-CM

## 2022-05-23 DIAGNOSIS — Z86010 Personal history of colonic polyps: Secondary | ICD-10-CM

## 2022-05-23 DIAGNOSIS — K5900 Constipation, unspecified: Secondary | ICD-10-CM

## 2022-05-23 DIAGNOSIS — Z9181 History of falling: Secondary | ICD-10-CM

## 2022-05-23 DIAGNOSIS — Z8546 Personal history of malignant neoplasm of prostate: Secondary | ICD-10-CM

## 2022-05-23 DIAGNOSIS — K219 Gastro-esophageal reflux disease without esophagitis: Secondary | ICD-10-CM

## 2022-05-23 DIAGNOSIS — M4150 Other secondary scoliosis, site unspecified: Secondary | ICD-10-CM

## 2022-05-24 NOTE — Progress Notes (Deleted)
NEUROLOGY FOLLOW UP OFFICE NOTE  Melvin [name] LC:[id number]  Subjective:  Melvin [name] is a 77 y.o. year old left leg cellulitis (01/2022), OA, low back pain, prostate cancer, scoliosis who we last saw on 04/05/22.  To briefly review: Patient has had difficulty with balance and stability. Per wife, she first noticed he was slow and difficulty walking in spring. He was walking without a cane at that time though. 02/15/22 patient was outside with dog in back deck. He slipped off the steps and into the leg and scraped the left leg in 3 places. He developed cellulitis in left leg. He has been on antibiotics (he continues to be on doxycycline). This has been patient's only fall. He walking seems worse in the morning and improves as the day goes on per wife. He freezes while walking occasionally.   He denies weakness in arms. He has had tremor that also started in the spring (left hand). Wife notices it more at rest.   He has not noticed any twitching or cramps.   He endorses low back pain.   Wife has also noticed some confusion. She mentions being on vacation in [place]. He came out of the bathroom and didn't know where he was. This lasted about 5 minutes. Wife feels like it occurs more at rest.   He endorses poor vision with multiple surgeries. He denies diplopia or ptosis. Per wife, the eyelids have always been asymmetric. He denies difficulties swallowing or chewing.   Wife has noticed a slight change in voice lately but more since his leg infection and being tired.   Patient sleeps well at night. He does not snore per wife. He takes one nap/or just laying down for about 20 minutes per day.    Patient endorses some poor mood due to poor health over the last year. He feels down at times.   Patient is currently getting home therapy. He also does the home exercises. He has been doing therapy since just before Thanksgiving and feels like it may be helping.   Patient denies significant weight  changes.   Of note, patient takes a B complex and multivitamin most days.   EtOH use: None  Restrictive diet? No Family history of neuropathy/myopathy/NM disease? No  Most recent Assessment and Plan (04/05/22): Melvin [name] is a 77 y.o. male who presents for evaluation of gait imbalance. He has a relevant medical history of left leg cellulitis (01/2022), OA, low back pain, prostate cancer, scoliosis. His neurological examination is pertinent for bradykinesia, rest tremor, rigidity of all extremities, gait imbalance, en-bloc turns. Available diagnostic data is significant for MRI lumbar spine showing significant lumbosacral stenosis and neural foraminal stenosis. Patient's history and examination are most consistent with parkinsonism, perhaps idiopathic Parkinson's disease. I discussed the concern and natural history with patient today. I will evaluate for potential mimics as below. Patient elected to trial Sinemet for response as discussion.   PLAN: -Blood work: B12, B1, copper, vit E -MRI brain and cervical spine wo contrast -Sinemet trial: 1/2 tablet TID -Continue home PT  Since their last visit: B12, B1, copper, and vit E were unremarkable. After patient's clinic visit on 04/05/22, he developed rigors and fever later in the day and presented to [facility]. Patient was found to be in sepsis due to group B strep from left leg cellulitis. He was admitted from 04/05/22-04/14/22. His hospitalization was complicated by hallucinations. There was concern that this was due to starting Sinemet. MRI brain showed no acute process,  atrophy, and ventriculomegaly. MRI cervical spine showed mild spinal stenosis, moderate b/l C5-6 NF stenosis, moderate to severe left NF stenosis. Patient was discharged to acute rehab where he stayed until ***.  Urinary problems?*** ***  MEDICATIONS:  Outpatient Encounter Medications as of 05/26/2022  Medication Sig   acetaminophen (TYLENOL) 500 MG tablet Take 500 mg by mouth every  6 (six) hours as needed for moderate pain.   b complex vitamins capsule Take 1 capsule by mouth daily.   cimetidine (TAGAMET) 200 MG tablet Take 200 mg by mouth 2 (two) times daily.   dorzolamidel-timolol (COSOPT PF) 22.3-6.8 MG/ML SOLN ophthalmic solution Place 1 drop into both eyes 2 (two) times daily.   EDEX 20 MCG injection 20 mcg by Intracavitary route as needed for erectile dysfunction.   famotidine (PEPCID) 20 MG tablet Take 10 mg by mouth 2 (two) times daily.   ibuprofen (ADVIL) 200 MG tablet Take 200 mg by mouth every 8 (eight) hours as needed for moderate pain.   linaclotide (LINZESS) 290 MCG CAPS capsule Take 1 capsule (290 mcg total) by mouth daily as needed (constipation).   Multiple Vitamin (MULTIVITAMIN WITH MINERALS) TABS tablet Take 1 tablet by mouth daily with lunch.   phenylephrine (NEO-SYNEPHRINE) 1 % nasal spray Place 1 drop into both nostrils as needed for congestion.   Tafluprost, PF, (ZIOPTAN) 0.0015 % SOLN Place 1 drop into both eyes at bedtime.   traZODone (DESYREL) 50 MG tablet Take 0.5 tablets (25 mg total) by mouth at bedtime as needed for sleep.   No facility-administered encounter medications on file as of 05/26/2022.    PAST MEDICAL HISTORY: Past Medical History:  Diagnosis Date   Arthritis    Cancer [facility])     PAST SURGICAL HISTORY: Past Surgical History:  Procedure Laterality Date   INGUINAL HERNIA REPAIR Right 04/01/2021   Procedure: LAPAROSCOPIC INGUINAL HERNIA;  Surgeon: Ralene Ok, MD;  Location: [place];  Service: General;  Laterality: Right;   INGUINAL HERNIA REPAIR Left 07/09/2021   Procedure: OPEN LEFT INGUINAL HERNIA REPAIR WITH MESH;  Surgeon: Ralene Ok, MD;  Location: [place];  Service: General;  Laterality: Left;   INSERTION OF MESH Right 04/01/2021   Procedure: INSERTION OF MESH;  Surgeon: Ralene Ok, MD;  Location: [place];  Service: General;  Laterality: Right;   INSERTION OF MESH Left 07/09/2021   Procedure: INSERTION OF MESH;   Surgeon: Ralene Ok, MD;  Location: [place];  Service: General;  Laterality: Left;   LYMPHADENECTOMY Bilateral 09/12/2012   Procedure: LYMPHADENECTOMY;  Surgeon: Alexis Frock, MD;  Location: [facility];  Service: Urology;  Laterality: Bilateral;   ROBOT ASSISTED LAPAROSCOPIC RADICAL PROSTATECTOMY N/A 09/12/2012   Procedure: ROBOTIC ASSISTED LAPAROSCOPIC RADICAL PROSTATECTOMY;  Surgeon: Alexis Frock, MD;  Location: [facility];  Service: Urology;  Laterality: N/A;   TONSILLECTOMY      ALLERGIES: Allergies  Allergen Reactions   Sulfa Antibiotics Hives    FAMILY HISTORY: No family history on file.  SOCIAL HISTORY: Social History   Tobacco Use   Smoking status: Former    Types: Cigarettes    Quit date: 09/12/1992    Years since quitting: 29.7   Smokeless tobacco: Never  Vaping Use   Vaping Use: Never used  Substance Use Topics   Alcohol use: Not Currently   Drug use: Never   Social History   Social History Narrative   Are you right handed or left handed? Right   Are you currently employed ? [employer]  What is your current occupation?   Do you live at home alone? wife   Who lives with you?    What type of home do you live in: 1 story or 2 story? Two but don't go up          Objective:  Vital Signs:  There were no vitals taken for this visit.  ***  Labs and Imaging review: New results: 04/05/22: Normal or unremarkable: vit E, copper, B1, B12 (485)  Ammonia (04/07/22): elevated to 47  MRI brain wo contrast (04/08/22): Per my review: Significant diffuse atrophy. Asymmetry of lateral ventricles (Left larger than right).  Evans index: 0.25 (0.20-0.25 normal; 0.25-.030 possible or early ventriculomegaly)  Radiology read: FINDINGS: A coronal T2 sequence is not obtained as the patient was unable to tolerate the complete examination.   Brain: There is no evidence of an acute infarct, mass, midline shift, or extra-axial fluid collection. Patchy T2  hyperintensities in the cerebral white matter and pons are nonspecific but compatible with moderate chronic small vessel ischemic disease. An asymmetric 2 cm region of T2 hyperintensity/gliosis is noted in the subcortical white matter of the posterolateral left frontal lobe with some associated curvilinear susceptibility which may reflect chronic blood products or vessels/possible developmental venous anomaly. 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   Lateral ventriculomegaly is again noted, including prominent enlargement of the left greater than right atria and occipital horns with surrounding white matter volume loss. The temporal horns are only mildly enlarged. The third ventricle and cerebral sulci are mildly enlarged.   Vascular: Major intracranial vascular flow voids are preserved.   Skull and upper cervical spine: Unremarkable bone marrow signal para   Sinuses/Orbits: Bilateral cataract extraction. Clear paranasal sinuses. Small right mastoid effusion.   Other: None.   IMPRESSION: 1. No acute intracranial abnormality. 2. Moderate chronic small vessel ischemic disease. 3. Ventriculomegaly which is favored to reflect advanced central predominant cerebral atrophy rather than hydrocephalus.  MRI cervical spine wo contrast (04/08/22): FINDINGS: Despite efforts by the technologist and patient, mild motion artifact is present on today's exam and could not be eliminated. This reduces exam sensitivity and specificity.   Alignment: Straightening with a slight retrolisthesis at the C5-6 and C6-7 levels.   Vertebrae: No acute or suspicious osseous findings. Mild endplate degenerative changes at C5-6 and C6-7.   Cord: Normal in signal and caliber.   Posterior Fossa, vertebral arteries, paraspinal tissues: Intracranial findings are dictated separately. The visualized posterior fossa appears unremarkable.Bilateral vertebral artery flow voids. No  significant paraspinal findings.   Disc levels:   Details of the individual disc space levels are partly obscured by motion artifact.   C2-3: Normal interspace.   C3-4: Asymmetric uncinate spurring and facet hypertrophy on the right contributing to mild to moderate right foraminal narrowing. No cord deformity. The left foramen appears widely patent.   C4-5: Preserved disc height. Mild facet hypertrophy, right greater than left. No spinal stenosis or nerve root encroachment.   C5-6: Chronic loss of disc height with posterior osteophytes and bilateral uncinate spurring. The CSF surrounding the cord is partially effaced without cord deformity. Moderate foraminal narrowing bilaterally which appears chronic.   C6-7: Chronic loss of disc height with posterior osteophytes and bilateral uncinate spurring. Mild right and moderate to severe left foraminal narrowing which appears chronic. No cord deformity.   C7-T1: Normal interspace.   IMPRESSION: 1. No acute findings or clear explanation for the patient's symptoms. 2. Chronic spondylosis at C5-6  and C6-7 contributing to mild spinal stenosis and moderate foraminal narrowing bilaterally at C5-6 and moderate to severe left foraminal narrowing at C6-7. 3. Mild to moderate right foraminal narrowing at C3-4 due to uncinate spurring and facet hypertrophy. 4. No cord deformity or abnormal cord signal.   Previously reviewed results: Internal labs: Normal or unremarkable (01/2022): CBC, CMP, TSH HbA1c (02/15/22): 5.7   MRI lumbar spine wo contrast (04/14/21): FINDINGS: Segmentation:  Standard.   Alignment: Unchanged levoscoliosis. Mild retrolisthesis at L1-L2, L2-L3, and L3-L4.   Vertebrae: No fracture, evidence of discitis, or bone lesion. Asymmetric degenerative endplate marrow edema at L1-L2 and L3-L4.   Conus medullaris and cauda equina: Conus extends to the L1 level. Conus and cauda equina appear normal.   Paraspinal and  other soft tissues: Negative.   Disc levels:   T12-L1: Minimal disc bulging. Mild bilateral facet arthropathy. No stenosis.   L1-L2: Circumferential disc osteophyte complex asymmetric to the left. Mild bilateral facet arthropathy. Mild left lateral recess and bilateral neuroforaminal stenosis. No spinal canal stenosis.   L2-L3: Bulky right far lateral disc osteophyte complex. Right-greater-than-left facet arthropathy. Mild right lateral recess stenosis. Moderate right neuroforaminal stenosis. No spinal canal or left neuroforaminal stenosis.   L3-L4: Right far lateral disc osteophyte complex. Mild bilateral facet arthropathy. Mild right lateral recess and bilateral neuroforaminal stenosis. No spinal canal stenosis.   L4-L5: Right-sided disc uncovering and disc bulging extending into the right neural foramen. Severe bilateral facet arthropathy. Moderate spinal canal and severe bilateral lateral recess stenosis. Moderate to severe right and moderate left neuroforaminal stenosis.   L5-S1: Small posterior and left far lateral disc osteophyte complexes. Severe right and mild left facet arthropathy. Moderate left and mild right lateral recess stenosis. Moderate to severe left neuroforaminal stenosis. No spinal canal or right neuroforaminal stenosis.   IMPRESSION: 1. Advanced multilevel degenerative changes of the lumbar spine as described above, worst at L4-L5.  Assessment/Plan:  This is Melvin [name], a 77 y.o. male with: ***   Plan: ***LP with PT for NPH work up? ***Sinemet?  Return to clinic in ***  Total time spent reviewing records, interview, history/exam, documentation, and coordination of care on day of encounter:  *** min  Kai Levins, MD

## 2022-05-26 ENCOUNTER — Ambulatory Visit: Payer: BC Managed Care – PPO | Admitting: Neurology

## 2022-05-26 NOTE — Progress Notes (Deleted)
NEUROLOGY FOLLOW UP OFFICE NOTE  Melvin [name] [id number]  Subjective:  Melvin [name] is a 77 y.o. year old left leg cellulitis (01/2022), OA, low back pain, prostate cancer, scoliosis who we last saw on 04/05/22.   To briefly review: Patient has had difficulty with balance and stability. Per wife, she first noticed he was slow and difficulty walking in spring. He was walking without a cane at that time though. 02/15/22 patient was outside with dog in back deck. He slipped off the steps and into the leg and scraped the left leg in 3 places. He developed cellulitis in left leg. He has been on antibiotics (he continues to be on doxycycline). This has been patient's only fall. He walking seems worse in the morning and improves as the day goes on per wife. He freezes while walking occasionally.   He denies weakness in arms. He has had tremor that also started in the spring (left hand). Wife notices it more at rest.   He has not noticed any twitching or cramps.   He endorses low back pain.   Wife has also noticed some confusion. She mentions being on vacation in [place]. He came out of the bathroom and didn't know where he was. This lasted about 5 minutes. Wife feels like it occurs more at rest.   He endorses poor vision with multiple surgeries. He denies diplopia or ptosis. Per wife, the eyelids have always been asymmetric. He denies difficulties swallowing or chewing.   Wife has noticed a slight change in voice lately but more since his leg infection and being tired.   Patient sleeps well at night. He does not snore per wife. He takes one nap/or just laying down for about 20 minutes per day.    Patient endorses some poor mood due to poor health over the last year. He feels down at times.   Patient is currently getting home therapy. He also does the home exercises. He has been doing therapy since just before Thanksgiving and feels like it may be helping.   Patient denies significant weight  changes.   Of note, patient takes a B complex and multivitamin most days.   EtOH use: None  Restrictive diet? No Family history of neuropathy/myopathy/NM disease? No   Most recent Assessment and Plan (04/05/22): Melvin [name] is a 77 y.o. male who presents for evaluation of gait imbalance. He has a relevant medical history of left leg cellulitis (01/2022), OA, low back pain, prostate cancer, scoliosis. His neurological examination is pertinent for bradykinesia, rest tremor, rigidity of all extremities, gait imbalance, en-bloc turns. Available diagnostic data is significant for MRI lumbar spine showing significant lumbosacral stenosis and neural foraminal stenosis. Patient's history and examination are most consistent with parkinsonism, perhaps idiopathic Parkinson's disease. I discussed the concern and natural history with patient today. I will evaluate for potential mimics as below. Patient elected to trial Sinemet for response as discussion.   PLAN: -Blood work: B12, B1, copper, vit E -MRI brain and cervical spine wo contrast -Sinemet trial: 1/2 tablet TID -Continue home PT   Since their last visit: B12, B1, copper, and vit E were unremarkable. After patient's clinic visit on 04/05/22, he developed rigors and fever later in the day and presented to [facility]. Patient was found to be in sepsis due to group B strep from left leg cellulitis. He was admitted from 04/05/22-04/14/22. His hospitalization was complicated by hallucinations. There was concern that this was due to starting Sinemet. MRI brain showed  no acute process, atrophy, and ventriculomegaly. MRI cervical spine showed mild spinal stenosis, moderate b/l C5-6 NF stenosis, moderate to severe left NF stenosis. Patient was discharged to acute rehab where he stayed until ***.   Urinary problems?*** ***  MEDICATIONS:  Outpatient Encounter Medications as of 06/08/2022  Medication Sig   acetaminophen (TYLENOL) 500 MG tablet Take 500 mg by mouth  every 6 (six) hours as needed for moderate pain.   b complex vitamins capsule Take 1 capsule by mouth daily.   cimetidine (TAGAMET) 200 MG tablet Take 200 mg by mouth 2 (two) times daily.   dorzolamidel-timolol (COSOPT PF) 22.3-6.8 MG/ML SOLN ophthalmic solution Place 1 drop into both eyes 2 (two) times daily.   EDEX 20 MCG injection 20 mcg by Intracavitary route as needed for erectile dysfunction.   famotidine (PEPCID) 20 MG tablet Take 10 mg by mouth 2 (two) times daily.   ibuprofen (ADVIL) 200 MG tablet Take 200 mg by mouth every 8 (eight) hours as needed for moderate pain.   linaclotide (LINZESS) 290 MCG CAPS capsule Take 1 capsule (290 mcg total) by mouth daily as needed (constipation).   Multiple Vitamin (MULTIVITAMIN WITH MINERALS) TABS tablet Take 1 tablet by mouth daily with lunch.   phenylephrine (NEO-SYNEPHRINE) 1 % nasal spray Place 1 drop into both nostrils as needed for congestion.   Tafluprost, PF, (ZIOPTAN) 0.0015 % SOLN Place 1 drop into both eyes at bedtime.   traZODone (DESYREL) 50 MG tablet Take 0.5 tablets (25 mg total) by mouth at bedtime as needed for sleep.   No facility-administered encounter medications on file as of 06/08/2022.    PAST MEDICAL HISTORY: Past Medical History:  Diagnosis Date   Arthritis    Cancer [facility])     PAST SURGICAL HISTORY: Past Surgical History:  Procedure Laterality Date   INGUINAL HERNIA REPAIR Right 04/01/2021   Procedure: LAPAROSCOPIC INGUINAL HERNIA;  Surgeon: Ralene Ok, MD;  Location: [place];  Service: General;  Laterality: Right;   INGUINAL HERNIA REPAIR Left 07/09/2021   Procedure: OPEN LEFT INGUINAL HERNIA REPAIR WITH MESH;  Surgeon: Ralene Ok, MD;  Location: [place];  Service: General;  Laterality: Left;   INSERTION OF MESH Right 04/01/2021   Procedure: INSERTION OF MESH;  Surgeon: Ralene Ok, MD;  Location: [place];  Service: General;  Laterality: Right;   INSERTION OF MESH Left 07/09/2021   Procedure: INSERTION OF  MESH;  Surgeon: Ralene Ok, MD;  Location: [place];  Service: General;  Laterality: Left;   LYMPHADENECTOMY Bilateral 09/12/2012   Procedure: LYMPHADENECTOMY;  Surgeon: Alexis Frock, MD;  Location: [facility];  Service: Urology;  Laterality: Bilateral;   ROBOT ASSISTED LAPAROSCOPIC RADICAL PROSTATECTOMY N/A 09/12/2012   Procedure: ROBOTIC ASSISTED LAPAROSCOPIC RADICAL PROSTATECTOMY;  Surgeon: Alexis Frock, MD;  Location: [facility];  Service: Urology;  Laterality: N/A;   TONSILLECTOMY      ALLERGIES: Allergies  Allergen Reactions   Sulfa Antibiotics Hives    FAMILY HISTORY: No family history on file.  SOCIAL HISTORY: Social History   Tobacco Use   Smoking status: Former    Types: Cigarettes    Quit date: 09/12/1992    Years since quitting: 29.7   Smokeless tobacco: Never  Vaping Use   Vaping Use: Never used  Substance Use Topics   Alcohol use: Not Currently   Drug use: Never   Social History   Social History Narrative   Are you right handed or left handed? Right   Are you currently employed ?  [employer]     What is your current occupation?   Do you live at home alone? wife   Who lives with you?    What type of home do you live in: 1 story or 2 story? Two but don't go up          Objective:  Vital Signs:  There were no vitals taken for this visit.  ***  Labs and Imaging review: New results: 04/05/22: Normal or unremarkable: vit E, copper, B1, B12 (485)   Ammonia (04/07/22): elevated to 47   MRI brain wo contrast (04/08/22): Per my review: Significant diffuse atrophy. Asymmetry of lateral ventricles (Left larger than right).  Evans index: 0.25 (0.20-0.25 normal; 0.25-.030 possible or early ventriculomegaly)   Radiology read: FINDINGS: A coronal T2 sequence is not obtained as the patient was unable to tolerate the complete examination.   Brain: There is no evidence of an acute infarct, mass, midline shift, or extra-axial fluid collection. Patchy T2  hyperintensities in the cerebral white matter and pons are nonspecific but compatible with moderate chronic small vessel ischemic disease. An asymmetric 2 cm region of T2 hyperintensity/gliosis is noted in the subcortical white matter of the posterolateral left frontal lobe with some associated curvilinear susceptibility which may reflect chronic blood products or vessels/possible developmental venous anomaly. 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   Lateral ventriculomegaly is again noted, including prominent enlargement of the left greater than right atria and occipital horns with surrounding white matter volume loss. The temporal horns are only mildly enlarged. The third ventricle and cerebral sulci are mildly enlarged.   Vascular: Major intracranial vascular flow voids are preserved.   Skull and upper cervical spine: Unremarkable bone marrow signal para   Sinuses/Orbits: Bilateral cataract extraction. Clear paranasal sinuses. Small right mastoid effusion.   Other: None.   IMPRESSION: 1. No acute intracranial abnormality. 2. Moderate chronic small vessel ischemic disease. 3. Ventriculomegaly which is favored to reflect advanced central predominant cerebral atrophy rather than hydrocephalus.   MRI cervical spine wo contrast (04/08/22): FINDINGS: Despite efforts by the technologist and patient, mild motion artifact is present on today's exam and could not be eliminated. This reduces exam sensitivity and specificity.   Alignment: Straightening with a slight retrolisthesis at the C5-6 and C6-7 levels.   Vertebrae: No acute or suspicious osseous findings. Mild endplate degenerative changes at C5-6 and C6-7.   Cord: Normal in signal and caliber.   Posterior Fossa, vertebral arteries, paraspinal tissues: Intracranial findings are dictated separately. The visualized posterior fossa appears unremarkable.Bilateral vertebral artery flow voids. No  significant paraspinal findings.   Disc levels:   Details of the individual disc space levels are partly obscured by motion artifact.   C2-3: Normal interspace.   C3-4: Asymmetric uncinate spurring and facet hypertrophy on the right contributing to mild to moderate right foraminal narrowing. No cord deformity. The left foramen appears widely patent.   C4-5: Preserved disc height. Mild facet hypertrophy, right greater than left. No spinal stenosis or nerve root encroachment.   C5-6: Chronic loss of disc height with posterior osteophytes and bilateral uncinate spurring. The CSF surrounding the cord is partially effaced without cord deformity. Moderate foraminal narrowing bilaterally which appears chronic.   C6-7: Chronic loss of disc height with posterior osteophytes and bilateral uncinate spurring. Mild right and moderate to severe left foraminal narrowing which appears chronic. No cord deformity.   C7-T1: Normal interspace.   IMPRESSION: 1. No acute findings or clear explanation  for the patient's symptoms. 2. 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 3. Mild to moderate right foraminal narrowing at C3-4 due to uncinate spurring and facet hypertrophy. 4. No cord deformity or abnormal cord signal.     Previously reviewed results: Internal labs: Normal or unremarkable (01/2022): CBC, CMP, TSH HbA1c (02/15/22): 5.7   MRI lumbar spine wo contrast (04/14/21): FINDINGS: Segmentation:  Standard.   Alignment: Unchanged levoscoliosis. Mild retrolisthesis at L1-L2, L2-L3, and L3-L4.   Vertebrae: No fracture, evidence of discitis, or bone lesion. Asymmetric degenerative endplate marrow edema at L1-L2 and L3-L4.   Conus medullaris and cauda equina: Conus extends to the L1 level. Conus and cauda equina appear normal.   Paraspinal and  other soft tissues: Negative.   Disc levels:   T12-L1: Minimal disc bulging. Mild bilateral facet arthropathy. No stenosis.   L1-L2: Circumferential disc osteophyte complex asymmetric to the left. Mild bilateral facet arthropathy. Mild left lateral recess and bilateral neuroforaminal stenosis. No spinal canal stenosis.   L2-L3: Bulky right far lateral disc osteophyte complex. Right-greater-than-left facet arthropathy. Mild right lateral recess stenosis. Moderate right neuroforaminal stenosis. No spinal canal or left neuroforaminal stenosis.   L3-L4: Right far lateral disc osteophyte complex. Mild bilateral facet arthropathy. Mild right lateral recess and bilateral neuroforaminal stenosis. No spinal canal stenosis.   L4-L5: Right-sided disc uncovering and disc bulging extending into the right neural foramen. Severe bilateral facet arthropathy. Moderate spinal canal and severe bilateral lateral recess stenosis. Moderate to severe right and moderate left neuroforaminal stenosis.   L5-S1: Small posterior and left far lateral disc osteophyte complexes. Severe right and mild left facet arthropathy. Moderate left and mild right lateral recess stenosis. Moderate to severe left neuroforaminal stenosis. No spinal canal or right neuroforaminal stenosis.   IMPRESSION: 1. Advanced multilevel degenerative changes of the lumbar spine as described above, worst at L4-L5.  Assessment/Plan:  This is Melvin [name], a 77 y.o. male with: ***   Plan: ***LP with PT for NPH work up? ***Sinemet?  Return to clinic in ***  Total time spent reviewing records, interview, history/exam, documentation, and coordination of care on day of encounter:  *** min  Kai Levins, MD

## 2022-06-08 ENCOUNTER — Ambulatory Visit: Payer: BC Managed Care – PPO | Admitting: Neurology

## 2022-06-24 ENCOUNTER — Ambulatory Visit: Payer: BC Managed Care – PPO | Admitting: Neurology

## 2022-07-14 ENCOUNTER — Telehealth: Payer: Self-pay | Admitting: Internal Medicine

## 2022-07-14 NOTE — Telephone Encounter (Signed)
Patient called and has an appointment at the [facility]  He would like copies of xrays or anything to do with lower back faxed to the [facility].  Fax:  [phone]

## 2022-07-19 DIAGNOSIS — Z8616 Personal history of COVID-19: Secondary | ICD-10-CM

## 2022-07-19 DIAGNOSIS — M48061 Spinal stenosis, lumbar region without neurogenic claudication: Secondary | ICD-10-CM

## 2022-07-19 DIAGNOSIS — H6123 Impacted cerumen, bilateral: Secondary | ICD-10-CM

## 2022-07-19 DIAGNOSIS — H409 Unspecified glaucoma: Secondary | ICD-10-CM

## 2022-07-19 DIAGNOSIS — I10 Essential (primary) hypertension: Secondary | ICD-10-CM

## 2022-07-19 DIAGNOSIS — Z9181 History of falling: Secondary | ICD-10-CM

## 2022-07-19 DIAGNOSIS — Z8546 Personal history of malignant neoplasm of prostate: Secondary | ICD-10-CM

## 2022-07-19 DIAGNOSIS — M47816 Spondylosis without myelopathy or radiculopathy, lumbar region: Secondary | ICD-10-CM

## 2022-07-19 DIAGNOSIS — R4189 Other symptoms and signs involving cognitive functions and awareness: Secondary | ICD-10-CM

## 2022-07-19 DIAGNOSIS — J309 Allergic rhinitis, unspecified: Secondary | ICD-10-CM

## 2022-07-19 DIAGNOSIS — K5900 Constipation, unspecified: Secondary | ICD-10-CM

## 2022-07-19 DIAGNOSIS — I739 Peripheral vascular disease, unspecified: Secondary | ICD-10-CM

## 2022-07-19 DIAGNOSIS — Z86010 Personal history of colonic polyps: Secondary | ICD-10-CM

## 2022-07-19 DIAGNOSIS — M19079 Primary osteoarthritis, unspecified ankle and foot: Secondary | ICD-10-CM

## 2022-07-19 DIAGNOSIS — M4150 Other secondary scoliosis, site unspecified: Secondary | ICD-10-CM

## 2022-07-19 DIAGNOSIS — L03116 Cellulitis of left lower limb: Secondary | ICD-10-CM

## 2022-07-19 DIAGNOSIS — K219 Gastro-esophageal reflux disease without esophagitis: Secondary | ICD-10-CM

## 2022-07-21 DIAGNOSIS — A419 Sepsis, unspecified organism: Secondary | ICD-10-CM

## 2022-08-23 ENCOUNTER — Encounter: Payer: Self-pay | Admitting: Internal Medicine

## 2022-08-23 NOTE — Progress Notes (Unsigned)
    Subjective:    Patient ID: Melvin [name], male    DOB: 06-02-1945, 77 y.o.   MRN: [id number]      HPI Melvin [name] is here for No chief complaint on file.        Medications and allergies reviewed with patient and updated if appropriate.  Current Outpatient Medications on File Prior to Visit  Medication Sig Dispense Refill   acetaminophen (TYLENOL) 500 MG tablet Take 500 mg by mouth every 6 (six) hours as needed for moderate pain.     b complex vitamins capsule Take 1 capsule by mouth daily.     cimetidine (TAGAMET) 200 MG tablet Take 200 mg by mouth 2 (two) times daily.     dorzolamidel-timolol (COSOPT PF) 22.3-6.8 MG/ML SOLN ophthalmic solution Place 1 drop into both eyes 2 (two) times daily.     EDEX 20 MCG injection 20 mcg by Intracavitary route as needed for erectile dysfunction.     famotidine (PEPCID) 20 MG tablet Take 10 mg by mouth 2 (two) times daily.     ibuprofen (ADVIL) 200 MG tablet Take 200 mg by mouth every 8 (eight) hours as needed for moderate pain.     linaclotide (LINZESS) 290 MCG CAPS capsule Take 1 capsule (290 mcg total) by mouth daily as needed (constipation). 30 capsule 4   Multiple Vitamin (MULTIVITAMIN WITH MINERALS) TABS tablet Take 1 tablet by mouth daily with lunch.     phenylephrine (NEO-SYNEPHRINE) 1 % nasal spray Place 1 drop into both nostrils as needed for congestion.     Tafluprost, PF, (ZIOPTAN) 0.0015 % SOLN Place 1 drop into both eyes at bedtime.     traZODone (DESYREL) 50 MG tablet Take 0.5 tablets (25 mg total) by mouth at bedtime as needed for sleep.     No current facility-administered medications on file prior to visit.    Review of Systems     Objective:  There were no vitals filed for this visit. BP Readings from Last 3 Encounters:  05/13/22 124/72  04/14/22 (!) 154/71  04/05/22 106/78   Wt Readings from Last 3 Encounters:  04/05/22 173 lb 12.6 oz (78.8 kg)  04/05/22 173 lb 12.8 oz (78.8 kg)  03/31/22 175 lb (79.4 kg)   There  is no height or weight on file to calculate BMI.    Physical Exam         Assessment & Plan:    See Problem List for Assessment and Plan of chronic medical problems.

## 2022-08-24 ENCOUNTER — Ambulatory Visit: Payer: BC Managed Care – PPO | Admitting: Internal Medicine

## 2022-08-24 VITALS — BP 172/102 | HR 55 | Temp 98.4°F | Ht 73.0 in | Wt 183.0 lb

## 2022-08-24 DIAGNOSIS — R030 Elevated blood-pressure reading, without diagnosis of hypertension: Secondary | ICD-10-CM

## 2022-08-24 DIAGNOSIS — H6123 Impacted cerumen, bilateral: Secondary | ICD-10-CM

## 2022-08-24 DIAGNOSIS — R5381 Other malaise: Secondary | ICD-10-CM

## 2022-08-24 DIAGNOSIS — H9193 Unspecified hearing loss, bilateral: Secondary | ICD-10-CM

## 2022-08-24 NOTE — Assessment & Plan Note (Signed)
Acute He has excessive cerumen in both ear canals-not liking packing at this time Ears were cleaned out and his hearing improved Discussed audiology evaluation so he knows a baseline-referral ordered if he wants to go Can continue Debrox as needed

## 2022-08-24 NOTE — Patient Instructions (Addendum)
     Your ears were cleaned out.      Medications changes include :   none   Monitor BP at home and update me after two weeks    A referral was ordered for audiology    Return if symptoms worsen or fail to improve.

## 2022-08-24 NOTE — Assessment & Plan Note (Signed)
Chronic Blood pressure very here today.  Likely elevated-this could be situational, but I am concerned that he is developing some hypertension He does check his blood pressure at home, but is not always consistently-advised to consistently check blood pressure over the next 2 weeks and update me with his readings Discussed if his pressure is still elevated periodically he may still need treatment No medication at this time-they will update me with his readings from home

## 2022-08-24 NOTE — Assessment & Plan Note (Signed)
Chronic Secondary to hospitalization this year for sepsis He has made great strides since his hospitalization, but still has a ways to go Needs to continue intensive physical therapy-once his home PT has come to an end needs to do outpatient physical therapy to continue to work on physical strength, stamina and balance I can refer him when that time comes

## 2022-08-30 ENCOUNTER — Other Ambulatory Visit: Payer: Self-pay | Admitting: Internal Medicine

## 2022-08-30 DIAGNOSIS — R262 Difficulty in walking, not elsewhere classified: Secondary | ICD-10-CM

## 2022-08-30 DIAGNOSIS — R29898 Other symptoms and signs involving the musculoskeletal system: Secondary | ICD-10-CM

## 2022-09-27 ENCOUNTER — Telehealth: Payer: Self-pay

## 2022-09-27 NOTE — Transitions of Care (Post Inpatient/ED Visit) (Unsigned)
   09/27/2022  Name: Melvin [name] MRN: [id number] DOB: 04-Jun-1945  Today's TOC FU Call Status: Today's TOC FU Call Status:: Unsuccessul Call (1st Attempt) Unsuccessful Call (1st Attempt) Date: 09/27/22  Attempted to reach the patient regarding the most recent Inpatient/ED visit.  Follow Up Plan: Additional outreach attempts will be made to reach the patient to complete the Transitions of Care (Post Inpatient/ED visit) call.   Signature Karena Addison, LPN [facility] Nurse Health Advisor Direct Dial [phone]

## 2022-09-28 NOTE — Transitions of Care (Post Inpatient/ED Visit) (Unsigned)
   09/28/2022  Name: Melvin [name] MRN: [id number] DOB: 11-16-1945  Today's TOC FU Call Status: Today's TOC FU Call Status:: Unsuccessful Call (2nd Attempt) Unsuccessful Call (1st Attempt) Date: 09/27/22 Unsuccessful Call (2nd Attempt) Date: 09/28/22  Attempted to reach the patient regarding the most recent Inpatient/ED visit.  Follow Up Plan: Additional outreach attempts will be made to reach the patient to complete the Transitions of Care (Post Inpatient/ED visit) call.   Signature Karena Addison, LPN [facility] Nurse Health Advisor Direct Dial [phone]

## 2022-09-29 NOTE — Transitions of Care (Post Inpatient/ED Visit) (Signed)
09/29/2022  Name: Melvin [name] MRN: [id number] DOB: Jun 03, 1945  Today's TOC FU Call Status: Today's TOC FU Call Status:: Successful TOC FU Call Competed Unsuccessful Call (1st Attempt) Date: 09/27/22 Unsuccessful Call (2nd Attempt) Date: 09/28/22 [facility] FU Call Complete Date: 09/29/22  Transition Care Management Follow-up Telephone Call Date of Discharge: 09/25/22 Discharge Facility: Other ([facility]) Name of Other ([facility]) Discharge Facility: [facility] Type of Discharge: Emergency Department Reason for ED Visit: Other: (eye infection) How have you been since you were released from the hospital?: Better Any questions or concerns?: No  Items Reviewed: Did you receive and understand the discharge instructions provided?: No Medications obtained,verified, and reconciled?: Yes (Medications Reviewed) Any new allergies since your discharge?: No Dietary orders reviewed?: NA Do you have support at home?: Yes People in Home: spouse  Medications Reviewed Today: Medications Reviewed Today     Reviewed by Karena Addison, LPN (Licensed Practical Nurse) on 09/29/22 at 1437  Med List Status: <None>   Medication Order Taking? Sig Documenting Provider Last Dose Status Informant  acetaminophen (TYLENOL) 500 MG tablet [id number] No Take 500 mg by mouth every 6 (six) hours as needed for moderate pain. [provider] Taking Active Self, Pharmacy Records  b complex vitamins capsule [id number] No Take 1 capsule by mouth daily. [provider] Taking Active Self, Pharmacy Records  cimetidine (TAGAMET) 200 MG tablet [id number] No Take 200 mg by mouth 2 (two) times daily. [provider] Taking Active Self, Pharmacy Records  dorzolamidel-timolol (COSOPT PF) 22.3-6.8 MG/ML SOLN ophthalmic solution [id number] No Place 1 drop into both eyes 2 (two) times daily. [provider] Taking Active Self, Pharmacy Records  EDEX 20 MCG injection [id number] No 20 mcg by  Intracavitary route as needed for erectile dysfunction. [provider] Taking Active Self, Pharmacy Records  famotidine (PEPCID) 20 MG tablet [id number] No Take 10 mg by mouth 2 (two) times daily. [provider] Taking Active   ibuprofen (ADVIL) 200 MG tablet [id number] No Take 200 mg by mouth every 8 (eight) hours as needed for moderate pain. [provider] Taking Active Self, Pharmacy Records  linaclotide [facility]) 290 MCG CAPS capsule [id number] No Take 1 capsule (290 mcg total) by mouth daily as needed (constipation). Pincus Sanes, MD Taking Active Self, Pharmacy Records  Multiple Vitamin (MULTIVITAMIN WITH MINERALS) TABS tablet [id number] No Take 1 tablet by mouth daily with lunch. [provider] Taking Active Self, Pharmacy Records  phenylephrine (NEO-SYNEPHRINE) 1 % nasal spray [id number] No Place 1 drop into both nostrils as needed for congestion. [provider] Taking Active Self, Pharmacy Records  Tafluprost, PF, (ZIOPTAN) 0.0015 % SOLN [id number] No Place 1 drop into both eyes at bedtime. [provider] Taking Active Self, Pharmacy Records  traZODone (DESYREL) 50 MG tablet [id number] No Take 0.5 tablets (25 mg total) by mouth at bedtime as needed for sleep. Alberteen Sam, MD Taking Active             Home Care and Equipment/Supplies: Were Home Health Services Ordered?: NA Any new equipment or medical supplies ordered?: NA  Functional Questionnaire: Do you need assistance with bathing/showering or dressing?: No Do you need assistance with meal preparation?: No Do you need assistance with eating?: No Do you have difficulty maintaining continence: No Do you need assistance with getting out of bed/getting out of a chair/moving?: No Do you have difficulty managing or taking your medications?: No  Follow up appointments reviewed: PCP Follow-up appointment confirmed?: NA Specialist Hospital Follow-up appointment  confirmed?: NA Do you need transportation to your follow-up appointment?: No Do you understand care options if your condition(s) worsen?: Yes-patient verbalized understanding    SIGNATURE Karena Addison, LPN [facility] Nurse Health Advisor Direct Dial [phone]

## 2022-10-12 ENCOUNTER — Other Ambulatory Visit: Payer: Self-pay | Admitting: Internal Medicine

## 2022-10-19 ENCOUNTER — Telehealth: Payer: Self-pay | Admitting: Internal Medicine

## 2022-10-19 NOTE — Telephone Encounter (Signed)
Patient's wife Melvin [name] called and said she would like a call back. She said she wanted to talk about his previous eye infection and that she had further questions. Best callback for Melvin [name] is [phone].

## 2022-10-21 NOTE — Telephone Encounter (Signed)
It is not possible to overdose on vitamin C so it 1000 mg daily is okay.  We can always check his level if they want, but not sure about the coverage with insurance.

## 2022-10-24 NOTE — Telephone Encounter (Signed)
2000 mg is the upper limits of safe.  Most people probably take [phone] mg daily.

## 2022-10-25 NOTE — Telephone Encounter (Signed)
Spoke to wife and relayed Dr. Lawerance Bach message. She understood with no further questions.

## 2023-01-27 ENCOUNTER — Telehealth: Payer: Self-pay | Admitting: Internal Medicine

## 2023-01-27 DIAGNOSIS — G20C Parkinsonism, unspecified: Secondary | ICD-10-CM

## 2023-01-27 DIAGNOSIS — I6381 Other cerebral infarction due to occlusion or stenosis of small artery: Secondary | ICD-10-CM

## 2023-01-27 DIAGNOSIS — I739 Peripheral vascular disease, unspecified: Secondary | ICD-10-CM

## 2023-01-27 NOTE — Telephone Encounter (Signed)
Patient has an appointment scheduled for 02/24/2023. They would like to know if they can have labs done prior to that appointment. Patient would like a call back at [phone].

## 2023-02-02 ENCOUNTER — Other Ambulatory Visit: Payer: Self-pay

## 2023-02-02 DIAGNOSIS — G20C Parkinsonism, unspecified: Secondary | ICD-10-CM | POA: Insufficient documentation

## 2023-02-02 DIAGNOSIS — I739 Peripheral vascular disease, unspecified: Secondary | ICD-10-CM

## 2023-02-02 DIAGNOSIS — I6381 Other cerebral infarction due to occlusion or stenosis of small artery: Secondary | ICD-10-CM | POA: Insufficient documentation

## 2023-02-02 DIAGNOSIS — Z0000 Encounter for general adult medical examination without abnormal findings: Secondary | ICD-10-CM

## 2023-02-02 DIAGNOSIS — R030 Elevated blood-pressure reading, without diagnosis of hypertension: Secondary | ICD-10-CM

## 2023-02-02 DIAGNOSIS — R739 Hyperglycemia, unspecified: Secondary | ICD-10-CM

## 2023-02-02 DIAGNOSIS — Z8673 Personal history of transient ischemic attack (TIA), and cerebral infarction without residual deficits: Secondary | ICD-10-CM | POA: Insufficient documentation

## 2023-02-02 NOTE — Addendum Note (Signed)
Addended by: Pincus Sanes on: 02/02/2023 04:47 PM   Modules accepted: Orders

## 2023-02-22 ENCOUNTER — Other Ambulatory Visit (INDEPENDENT_AMBULATORY_CARE_PROVIDER_SITE_OTHER): Payer: Medicare PPO

## 2023-02-22 DIAGNOSIS — Z0000 Encounter for general adult medical examination without abnormal findings: Secondary | ICD-10-CM

## 2023-02-22 DIAGNOSIS — R739 Hyperglycemia, unspecified: Secondary | ICD-10-CM

## 2023-02-22 DIAGNOSIS — G20C Parkinsonism, unspecified: Secondary | ICD-10-CM | POA: Diagnosis not present

## 2023-02-22 DIAGNOSIS — I739 Peripheral vascular disease, unspecified: Secondary | ICD-10-CM

## 2023-02-22 DIAGNOSIS — R030 Elevated blood-pressure reading, without diagnosis of hypertension: Secondary | ICD-10-CM

## 2023-02-22 DIAGNOSIS — I6381 Other cerebral infarction due to occlusion or stenosis of small artery: Secondary | ICD-10-CM | POA: Diagnosis not present

## 2023-02-22 LAB — CBC WITH DIFFERENTIAL/PLATELET
Basophils Absolute: 0 10*3/uL (ref 0.0–0.1)
Basophils Relative: 0.7 % (ref 0.0–3.0)
Eosinophils Absolute: 0.3 10*3/uL (ref 0.0–0.7)
Eosinophils Relative: 4.5 % (ref 0.0–5.0)
HCT: 45.1 % (ref 39.0–52.0)
Hemoglobin: 14.7 g/dL (ref 13.0–17.0)
Lymphocytes Relative: 34.3 % (ref 12.0–46.0)
Lymphs Abs: 2 10*3/uL (ref 0.7–4.0)
MCHC: 32.6 g/dL (ref 30.0–36.0)
MCV: 102 fL — ABNORMAL HIGH (ref 78.0–100.0)
Monocytes Absolute: 0.7 10*3/uL (ref 0.1–1.0)
Monocytes Relative: 11.9 % (ref 3.0–12.0)
Neutro Abs: 2.8 10*3/uL (ref 1.4–7.7)
Neutrophils Relative %: 48.6 % (ref 43.0–77.0)
Platelets: 187 10*3/uL (ref 150.0–400.0)
RBC: 4.43 Mil/uL (ref 4.22–5.81)
RDW: 12.4 % (ref 11.5–15.5)
WBC: 5.7 10*3/uL (ref 4.0–10.5)

## 2023-02-22 LAB — LIPID PANEL
Cholesterol: 173 mg/dL (ref 0–200)
HDL: 70.8 mg/dL (ref >39.00)
LDL Cholesterol: 90 mg/dL (ref 0–99)
NonHDL: 102.3
Total CHOL/HDL Ratio: 2
Triglycerides: 62 mg/dL (ref 0.0–149.0)
VLDL: 12.4 mg/dL (ref 0.0–40.0)

## 2023-02-22 LAB — COMPREHENSIVE METABOLIC PANEL
ALT: 23 U/L (ref 0–53)
AST: 27 U/L (ref 0–37)
Albumin: 4.1 g/dL (ref 3.5–5.2)
Alkaline Phosphatase: 70 U/L (ref 39–117)
BUN: 20 mg/dL (ref 6–23)
CO2: 28 meq/L (ref 19–32)
Calcium: 9.1 mg/dL (ref 8.4–10.5)
Chloride: 104 meq/L (ref 96–112)
Creatinine, Ser: 1 mg/dL (ref 0.40–1.50)
GFR: 72.25 mL/min (ref >60.00)
Glucose, Bld: 93 mg/dL (ref 70–99)
Potassium: 4.7 meq/L (ref 3.5–5.1)
Sodium: 138 meq/L (ref 135–145)
Total Bilirubin: 0.5 mg/dL (ref 0.2–1.2)
Total Protein: 6.9 g/dL (ref 6.0–8.3)

## 2023-02-22 LAB — HEMOGLOBIN A1C: Hgb A1c MFr Bld: 5.7 % (ref 4.6–6.5)

## 2023-02-23 ENCOUNTER — Encounter: Payer: Self-pay | Admitting: Internal Medicine

## 2023-02-23 NOTE — Progress Notes (Signed)
Subjective:    Patient ID: Melvin [name], male    DOB: 05-Apr-1946, 77 y.o.   MRN: [id number]     HPI Melvin [name] is here for a welcome to wellness exam and a physical exam and his chronic medical problems.  Saw neuro 10/2022 - suspect parkinsonism - probably parkinson disease.  May have DaTscan, movement d/o specialist, sinemet cr, requip, EMG.  Movement is better since taking sinemet.    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   Sleeps all day.  Energy level is low.  He is still doing physical therapy.  Does sit most the day.  Falls asleep okay at night, but wakes up early.   Here for a welcome to medicare wellness exam.   I have personally reviewed and have noted 1.         The patient's medical and social history 2.         Their use of alcohol, tobacco or illicit drugs 3.         Their current medications and supplements 4.         The patient's functional ability including ADL's, fall risks, home safety risk and hearing or visual impairment. 5.         Diet and physical activities 6.         Evidence for depression or mood disorders 7.         Care team reviewed  - neurology Dr Antonietta Barcelona, Eye - [facility], dermatology-Dr. Doreen Beam, sports med - Dr Katrinka Blazing, urology    Are there smokers in your home (other than you)? No  Risk Factors Exercise: doing PT now, walking a little - encouraged increased exercise Dietary issues discussed: Eating healthy overall-well-balanced diet  Vitamin and supplement use: Reviewed vitamins-B complex, multivitamin, magnesium  Opiod use:   N/A   Cardiac risk factors: advanced age, hypertension, hyperglycemia  Depression Screen  Have you felt down, depressed or hopeless? No  Have you felt little interest or pleasure in doing things?  No  Activities of Daily Living In your present state of health, do you have any difficulty performing the following activities?:  Driving? yes-not driving Managing money?  No Feeding  yourself? No Getting from bed to chair? No Climbing a flight of stairs? No as long as there is railings Preparing food and eating?: No Bathing or showering? yes - needs assistance Getting dressed: yes - needs assistance Getting to/using the toilet? No Moving around from place to place: Yes-uses a walker In the past year have you fallen or had a near fall?: yes   Are you sexually active?  yes  Do you have more than one partner?  no  Hearing Difficulties: yes Do you often ask people to speak up or repeat themselves? No Do you experience ringing or noises in your ears? No Do you have difficulty understanding soft or whispered voices?  Yes Vision:              Any change in vision:  No - stable             Up to date with eye exam:  yes  Memory:  Do you feel that you have a problem with memory? No  Do you often misplace items? No  Do you feel safe at home?  Yes  Cognitive Testing  Alert, Orientated? Yes  Normal Appearance? Yes  Recall of three objects?  Yes  Can perform simple calculations? Yes  Displays appropriate judgment? Yes  Can read the correct time from a watch face? Yes   Advanced Directives have been discussed with the patient? Yes     Medications and allergies reviewed with patient and updated if appropriate.  Current Outpatient Medications on File Prior to Visit  Medication Sig Dispense Refill   acetaminophen (TYLENOL) 500 MG tablet Take 500 mg by mouth every 6 (six) hours as needed for moderate pain.     b complex vitamins capsule Take 1 capsule by mouth daily.     carbidopa-levodopa (SINEMET IR) 25-100 MG tablet Take 1/2 tab BY MOUTH THREE TIMES DAILY     cimetidine (TAGAMET) 200 MG tablet Take 200 mg by mouth 2 (two) times daily.     dorzolamidel-timolol (COSOPT PF) 22.3-6.8 MG/ML SOLN ophthalmic solution Place 1 drop into both eyes 2 (two) times daily.     EDEX 20 MCG injection 20 mcg by Intracavitary route as needed for erectile dysfunction.     famotidine  (PEPCID) 20 MG tablet Take 10 mg by mouth 2 (two) times daily.     ibuprofen (ADVIL) 200 MG tablet Take 200 mg by mouth every 8 (eight) hours as needed for moderate pain.     linaclotide (LINZESS) 290 MCG CAPS capsule TAKE ONE CAPSULE BY MOUTH BEFORE BREAKFAST AS NEEDED FOR CONSTIPATION 30 capsule 5   MAGNESIUM GLYCINATE PO Take by mouth.     Multiple Vitamin (MULTIVITAMIN WITH MINERALS) TABS tablet Take 1 tablet by mouth daily with lunch.     phenylephrine (NEO-SYNEPHRINE) 1 % nasal spray Place 1 drop into both nostrils as needed for congestion.     Tafluprost, PF, (ZIOPTAN) 0.0015 % SOLN Place 1 drop into both eyes at bedtime.     No current facility-administered medications on file prior to visit.    Review of Systems  Constitutional:  Negative for fever.  HENT:  Positive for hearing loss. Negative for tinnitus.   Eyes:  Negative for visual disturbance.  Respiratory:  Negative for cough, shortness of breath and wheezing.   Cardiovascular:  Positive for leg swelling. Negative for chest pain and palpitations.  Gastrointestinal:  Positive for constipation. Negative for abdominal pain, blood in stool and diarrhea.       Gerd controlled  Genitourinary:  Positive for frequency. Negative for difficulty urinating, dysuria and hematuria.  Musculoskeletal:  Positive for arthralgias (knees) and back pain (chronic).  Skin:  Negative for rash.  Neurological:  Negative for light-headedness and headaches.  Psychiatric/Behavioral:  Positive for sleep disturbance. Negative for dysphoric mood. The patient is not nervous/anxious.        Objective:   Vitals:   02/24/23 0850  BP: 130/88  Pulse: 67  Temp: 98.6 F (37 C)  SpO2: 97%   Filed Weights   02/24/23 0850  Weight: 196 lb (88.9 kg)   Body mass index is 25.86 kg/m.  BP Readings from Last 3 Encounters:  02/24/23 130/88  08/24/22 (!) 172/102  05/13/22 124/72    Wt Readings from Last 3 Encounters:  02/24/23 196 lb (88.9 kg)   08/24/22 183 lb (83 kg)  04/05/22 173 lb 12.6 oz (78.8 kg)   Vision Screening   Right eye Left eye Both eyes  Without correction 70/20 70/20   With correction         Physical Exam Constitutional: He appears well-developed and well-nourished. No distress.  HENT:  Head: Normocephalic and atraumatic.  Right Ear: External ear normal.  Left Ear: External ear normal.  Normal  ear canals but was excessive cerumen.  TM bilaterally not visualized Mouth/Throat: Oropharynx is clear and moist. Eyes: Conjunctivae and EOM are normal.  Neck: Neck supple. No tracheal deviation present. No thyromegaly present.  No carotid bruit  Cardiovascular: Normal rate, regular rhythm, normal heart sounds and intact distal pulses.   No murmur heard.  B/l 1+ pitting lower extremity edema. Pulmonary/Chest: Effort normal and breath sounds normal. No respiratory distress. He has no wheezes. He has no rales.  Abdominal: Soft. He exhibits no distension. There is no tenderness.  Genitourinary: deferred  Lymphadenopathy:   He has no cervical adenopathy.  Skin: Skin is warm and dry. He is not diaphoretic.  Psychiatric: He has a normal mood and affect. His behavior is normal.    Bilateral ear lavage performed by CMA-see procedure note     Assessment & Plan:   Wellness Exam: Immunizations-discussed-up-to-date Colonoscopy-aged out Eye exam-up-to-date Hearing loss -mild-he does not feel he needs further evaluation at this time Memory concerns/difficulties-no concerns.  Still working and teaching classes Independent of ADLs-needs assistance with some ADLs Stressed the importance of regular exercise-advised that he start walking in the house 10 minutes 3 times a day-also has gym membership that he can start going to Discussed fall prevention-currently doing PT  Patient received copy of preventative screening tests/immunizations recommended for the next 5-10 years.    Physical exam: Screening blood work   ordered Exercise   walking a little  Weight  normal Substance abuse   none   Reviewed recommended immunizations.   Health Maintenance  Topic Date Due   Medicare Annual Wellness (AWV)  Never done   COVID-19 Vaccine (4 - 2023-24 season) 03/31/2023   DTaP/Tdap/Td (4 - Td or Tdap) 02/24/2032   Pneumonia Vaccine 101+ Years old  Completed   INFLUENZA VACCINE  Completed   Hepatitis C Screening  Completed   HPV VACCINES  Aged Out   Zoster Vaccines- Shingrix  Discontinued     See Problem List for Assessment and Plan of chronic medical problems.

## 2023-02-23 NOTE — Patient Instructions (Addendum)
Melvin [name] , Thank you for taking time to come for your Medicare Wellness Visit. I appreciate your ongoing commitment to your health goals. Please review the following plan we discussed and let me know if I can assist you in the future.   These are the goals we discussed:  Goals   Work on exercising a lot     This is a list of the screening recommended for you and due dates:  Health Maintenance  Topic Date Due   Medicare Annual Wellness Visit  Never done   COVID-19 Vaccine (4 - 2023-24 season) 03/31/2023   DTaP/Tdap/Td vaccine (4 - Td or Tdap) 02/24/2032   Pneumonia Vaccine  Completed   Flu Shot  Completed   Hepatitis C Screening  Completed   HPV Vaccine  Aged Out   Zoster (Shingles) Vaccine  Discontinued        Medications changes include :   None     Return in about 1 year (around 02/24/2024) for Physical Exam.    Health Maintenance, Male Adopting a healthy lifestyle and getting preventive care are important in promoting health and wellness. Ask your health care provider about: The right schedule for you to have regular tests and exams. Things you can do on your own to prevent diseases and keep yourself healthy. What should I know about diet, weight, and exercise? Eat a healthy diet  Eat a diet that includes plenty of vegetables, fruits, low-fat dairy products, and lean protein. Do not eat a lot of foods that are high in solid fats, added sugars, or sodium. Maintain a healthy weight Body mass index (BMI) is a measurement that can be used to identify possible weight problems. It estimates body fat based on height and weight. Your health care provider can help determine your BMI and help you achieve or maintain a healthy weight. Get regular exercise Get regular exercise. This is one of the most important things you can do for your health. Most adults should: Exercise for at least 150 minutes each week. The exercise should increase your heart rate and make you sweat  (moderate-intensity exercise). Do strengthening exercises at least twice a week. This is in addition to the moderate-intensity exercise. Spend less time sitting. Even light physical activity can be beneficial. Watch cholesterol and blood lipids Have your blood tested for lipids and cholesterol at 78 years of age, then have this test every 5 years. You may need to have your cholesterol levels checked more often if: Your lipid or cholesterol levels are high. You are older than 78 years of age. You are at high risk for heart disease. What should I know about cancer screening? Many types of cancers can be detected early and may often be prevented. Depending on your health history and family history, you may need to have cancer screening at various ages. This may include screening for: Colorectal cancer. Prostate cancer. Skin cancer. Lung cancer. What should I know about heart disease, diabetes, and high blood pressure? Blood pressure and heart disease High blood pressure causes heart disease and increases the risk of stroke. This is more likely to develop in people who have high blood pressure readings or are overweight. Talk with your health care provider about your target blood pressure readings. Have your blood pressure checked: Every 3-5 years if you are 53-37 years of age. Every year if you are 75 years old or older. If you are between the ages of 58 and 67 and are a current  or former smoker, ask your health care provider if you should have a one-time screening for abdominal aortic aneurysm (AAA). Diabetes Have regular diabetes screenings. This checks your fasting blood sugar level. Have the screening done: Once every three years after age 30 if you are at a normal weight and have a low risk for diabetes. More often and at a younger age if you are overweight or have a high risk for diabetes. What should I know about preventing infection? Hepatitis B If you have a higher risk for  hepatitis B, you should be screened for this virus. Talk with your health care provider to find out if you are at risk for hepatitis B infection. Hepatitis C Blood testing is recommended for: Everyone born from 18 through 1965. Anyone with known risk factors for hepatitis C. Sexually transmitted infections (STIs) You should be screened each year for STIs, including gonorrhea and chlamydia, if: You are sexually active and are younger than 78 years of age. You are older than 78 years of age and your health care provider tells you that you are at risk for this type of infection. Your sexual activity has changed since you were last screened, and you are at increased risk for chlamydia or gonorrhea. Ask your health care provider if you are at risk. Ask your health care provider about whether you are at high risk for HIV. Your health care provider may recommend a prescription medicine to help prevent HIV infection. If you choose to take medicine to prevent HIV, you should first get tested for HIV. You should then be tested every 3 months for as long as you are taking the medicine. Follow these instructions at home: Alcohol use Do not drink alcohol if your health care provider tells you not to drink. If you drink alcohol: Limit how much you have to 0-2 drinks a day. Know how much alcohol is in your drink. In the [place] one drink equals one 12 oz bottle of beer (355 mL), one 5 oz glass of wine (148 mL), or one 1 oz glass of hard liquor (44 mL). Lifestyle Do not use any products that contain nicotine or tobacco. These products include cigarettes, chewing tobacco, and vaping devices, such as e-cigarettes. If you need help quitting, ask your health care provider. Do not use street drugs. Do not share needles. Ask your health care provider for help if you need support or information about quitting drugs. General instructions Schedule regular health, dental, and eye exams. Stay current with your  vaccines. Tell your health care provider if: You often feel depressed. You have ever been abused or do not feel safe at home. Summary Adopting a healthy lifestyle and getting preventive care are important in promoting health and wellness. Follow your health care provider's instructions about healthy diet, exercising, and getting tested or screened for diseases. Follow your health care provider's instructions on monitoring your cholesterol and blood pressure. This information is not intended to replace advice given to you by your health care provider. Make sure you discuss any questions you have with your health care provider. Document Revised: 09/07/2020 Document Reviewed: 09/07/2020 Elsevier Patient Education  2024 [employer].

## 2023-02-24 ENCOUNTER — Ambulatory Visit (INDEPENDENT_AMBULATORY_CARE_PROVIDER_SITE_OTHER): Payer: Medicare PPO | Admitting: Internal Medicine

## 2023-02-24 VITALS — BP 130/88 | HR 67 | Temp 98.6°F | Ht 73.0 in | Wt 196.0 lb

## 2023-02-24 DIAGNOSIS — Z0001 Encounter for general adult medical examination with abnormal findings: Secondary | ICD-10-CM | POA: Diagnosis not present

## 2023-02-24 DIAGNOSIS — R739 Hyperglycemia, unspecified: Secondary | ICD-10-CM | POA: Diagnosis not present

## 2023-02-24 DIAGNOSIS — G20C Parkinsonism, unspecified: Secondary | ICD-10-CM

## 2023-02-24 DIAGNOSIS — H6123 Impacted cerumen, bilateral: Secondary | ICD-10-CM

## 2023-02-24 DIAGNOSIS — Z0000 Encounter for general adult medical examination without abnormal findings: Secondary | ICD-10-CM

## 2023-02-24 DIAGNOSIS — I6381 Other cerebral infarction due to occlusion or stenosis of small artery: Secondary | ICD-10-CM

## 2023-02-24 DIAGNOSIS — K5900 Constipation, unspecified: Secondary | ICD-10-CM | POA: Diagnosis not present

## 2023-02-24 DIAGNOSIS — R030 Elevated blood-pressure reading, without diagnosis of hypertension: Secondary | ICD-10-CM

## 2023-02-24 DIAGNOSIS — K219 Gastro-esophageal reflux disease without esophagitis: Secondary | ICD-10-CM

## 2023-02-24 NOTE — Assessment & Plan Note (Signed)
Has seen neurology Neurology plans on reviewing the imaging and doing further evaluation if needed

## 2023-02-24 NOTE — Assessment & Plan Note (Signed)
BP has varied slightly Today systolic blood pressure is very good, but diastolic is borderline Given probable Parkinson's disease I will hold off on putting him on any medication He will continue to monitor at home and we can start medication if needed

## 2023-02-24 NOTE — Assessment & Plan Note (Signed)
Chronic GERD controlled Continue Tagamet over-the-counter

## 2023-02-24 NOTE — Progress Notes (Signed)
PRE-PROCEDURE EXAM: Bilateral TM cannot be visualized due to total occlusion/impaction of the ear canal.  PROCEDURE INDICATION: remove wax to visualize ear drum & relieve discomfort  CONSENT:  Verbal     PROCEDURE NOTE:     Bilateral  EAR:  I used warm water irrigation under direct visualization with the otoscope to free the wax bolus from the ear canal.    POST- PROCEDURE EXAM: Bilateral TMs successfully visualized and found to have no erythema     The patient tolerated the procedure well.

## 2023-02-24 NOTE — Assessment & Plan Note (Addendum)
Chronic Lab Results  Component Value Date   HGBA1C 5.7 02/22/2023   Sugars just barely in the prediabetic range Low sugar / carb diet Stressed regular exercise-he will try to increase his exercise

## 2023-02-24 NOTE — Assessment & Plan Note (Addendum)
Following with neurology at [facility] Has been placed on Sinemet Exercising regularly with physical therapy-stressed he needs to be doing more May end up having DaTscan and seeing a movement disorder specialist, EMG

## 2023-02-24 NOTE — Assessment & Plan Note (Addendum)
Chronic Intermittent Taking linzess prn-this is a little stronger than.  Discussed that we could use a lower dose, but he does not take this often Increase foods such as prunes, dates, raisins and other foods that can help with constipation Increase exercise, increase water intake Can consider a stool softener

## 2023-02-28 ENCOUNTER — Other Ambulatory Visit: Payer: Self-pay | Admitting: Internal Medicine

## 2023-02-28 DIAGNOSIS — L309 Dermatitis, unspecified: Secondary | ICD-10-CM

## 2023-03-05 IMAGING — DX DG ABDOMEN 1V
1 series · 1 of 1 positions shown · non-contrast
Comparison: Plain film 1 day prior

CLINICAL DATA: Foreign body

EXAM:
ABDOMEN - 1 VIEW

[abdomen supine]
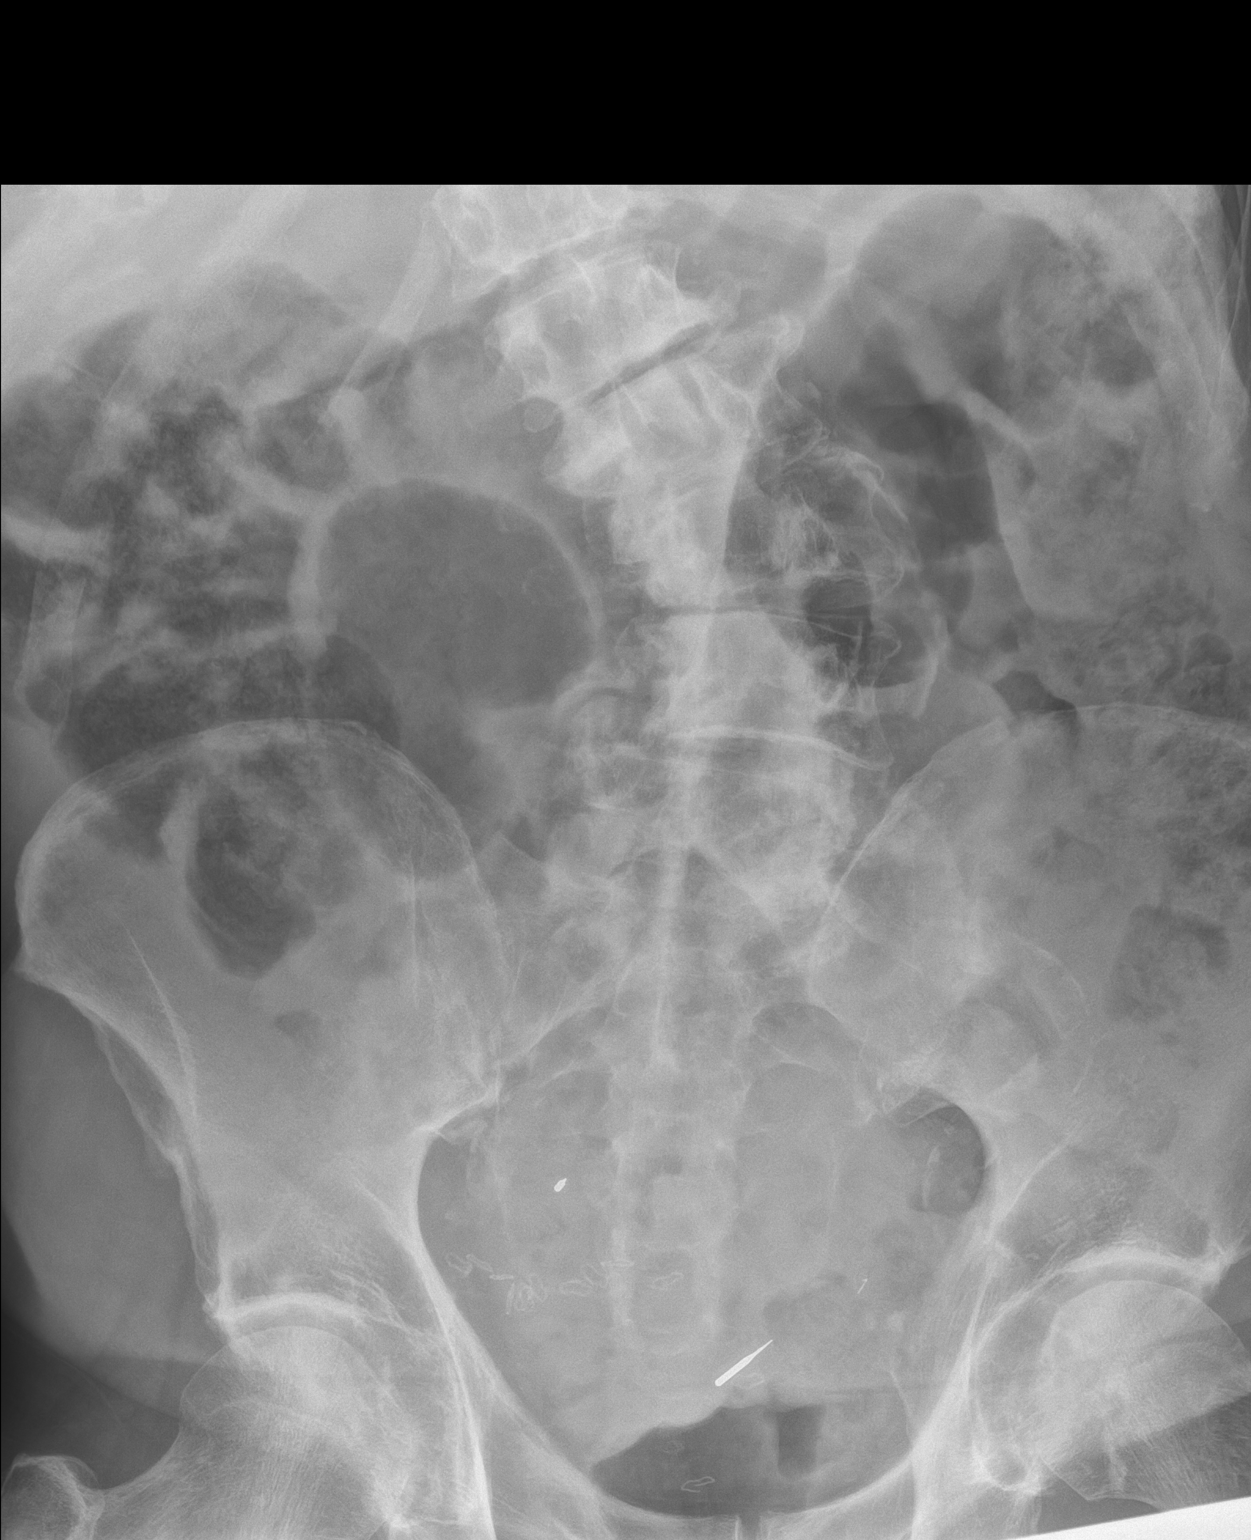

[1 of 1 positions shown; findings below may reference images not displayed]

FINDINGS: Linear metallic foreign body has migrated to the central lower
pelvis.
IMPRESSION: Metallic foreign body migrated to central lower pelvis. Favor
rectosigmoid colon location over small bowel.

## 2023-03-10 IMAGING — DX DG ABDOMEN 1V
1 series · 1 of 1 positions shown · non-contrast
Comparison: August 02, 2021.

CLINICAL DATA: Ingested foreign bodies.

EXAM:
ABDOMEN - 1 VIEW

[abdomen supine]
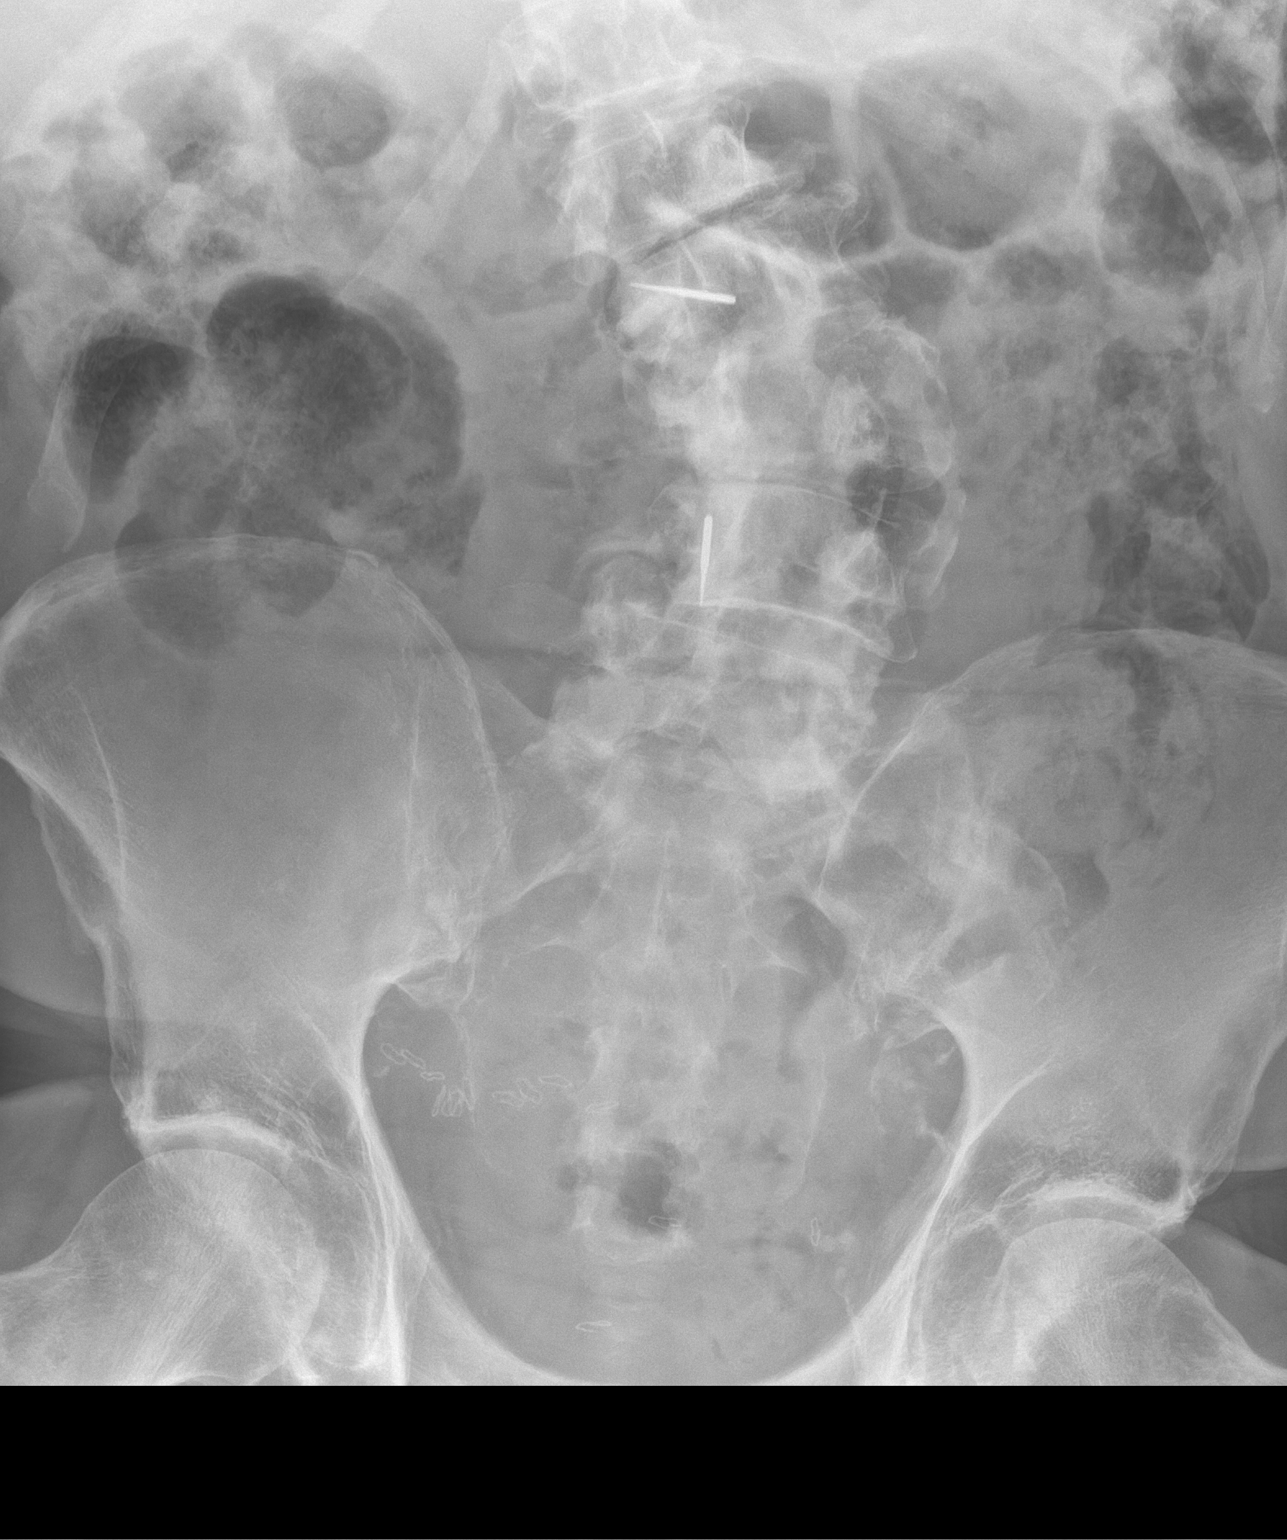

[1 of 1 positions shown; findings below may reference images not displayed]

FINDINGS: No abnormal bowel dilatation is noted. 2 linear metallic densities
are noted in the central portion of the abdomen, positioned over the
L2 and L4 vertebral bodies.
IMPRESSION: Two linear metallic densities are again noted, currently positioned
over the lumbar spine.

## 2023-03-12 IMAGING — DX DG ABDOMEN 1V
3 series · 3 of 3 positions shown · non-contrast
Comparison: 08/03/2021

CLINICAL DATA: Reevaluate position of swallowed metallic foreign
bodies

EXAM:
ABDOMEN - 1 VIEW

[abdomen supine (1 of 3)]
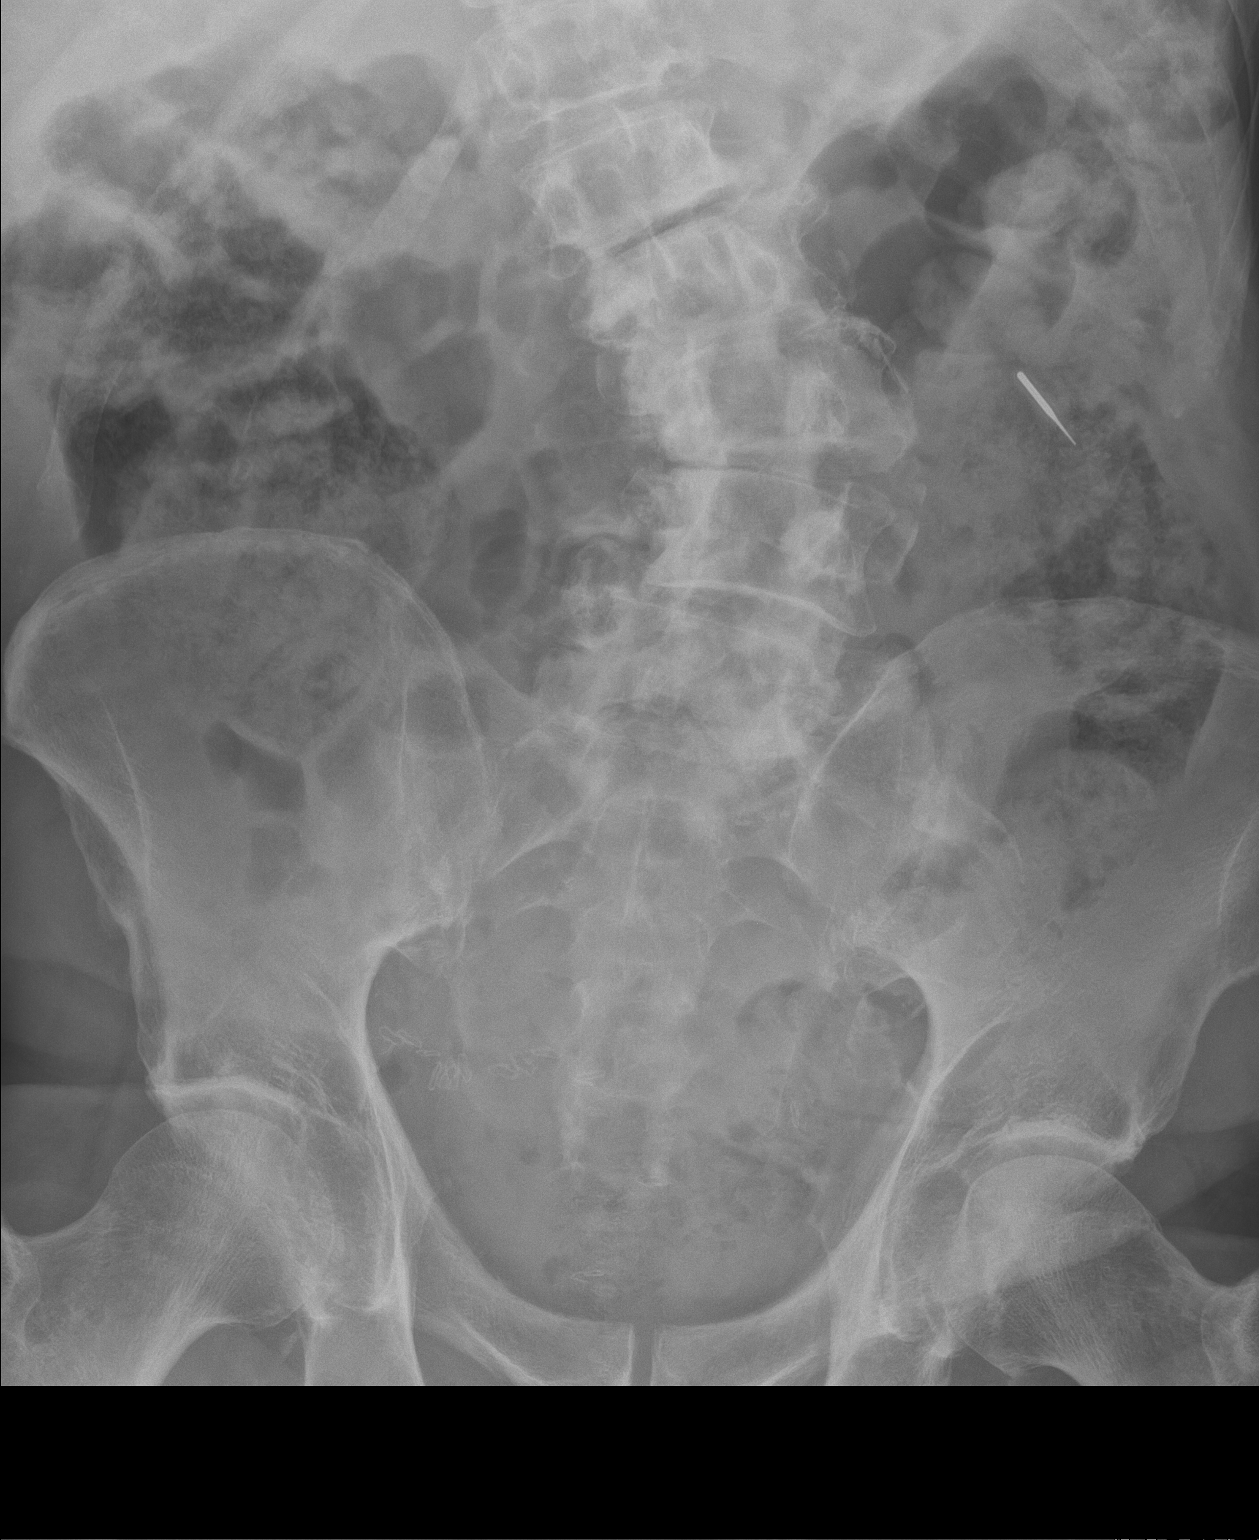

[abdomen supine (2 of 3)]
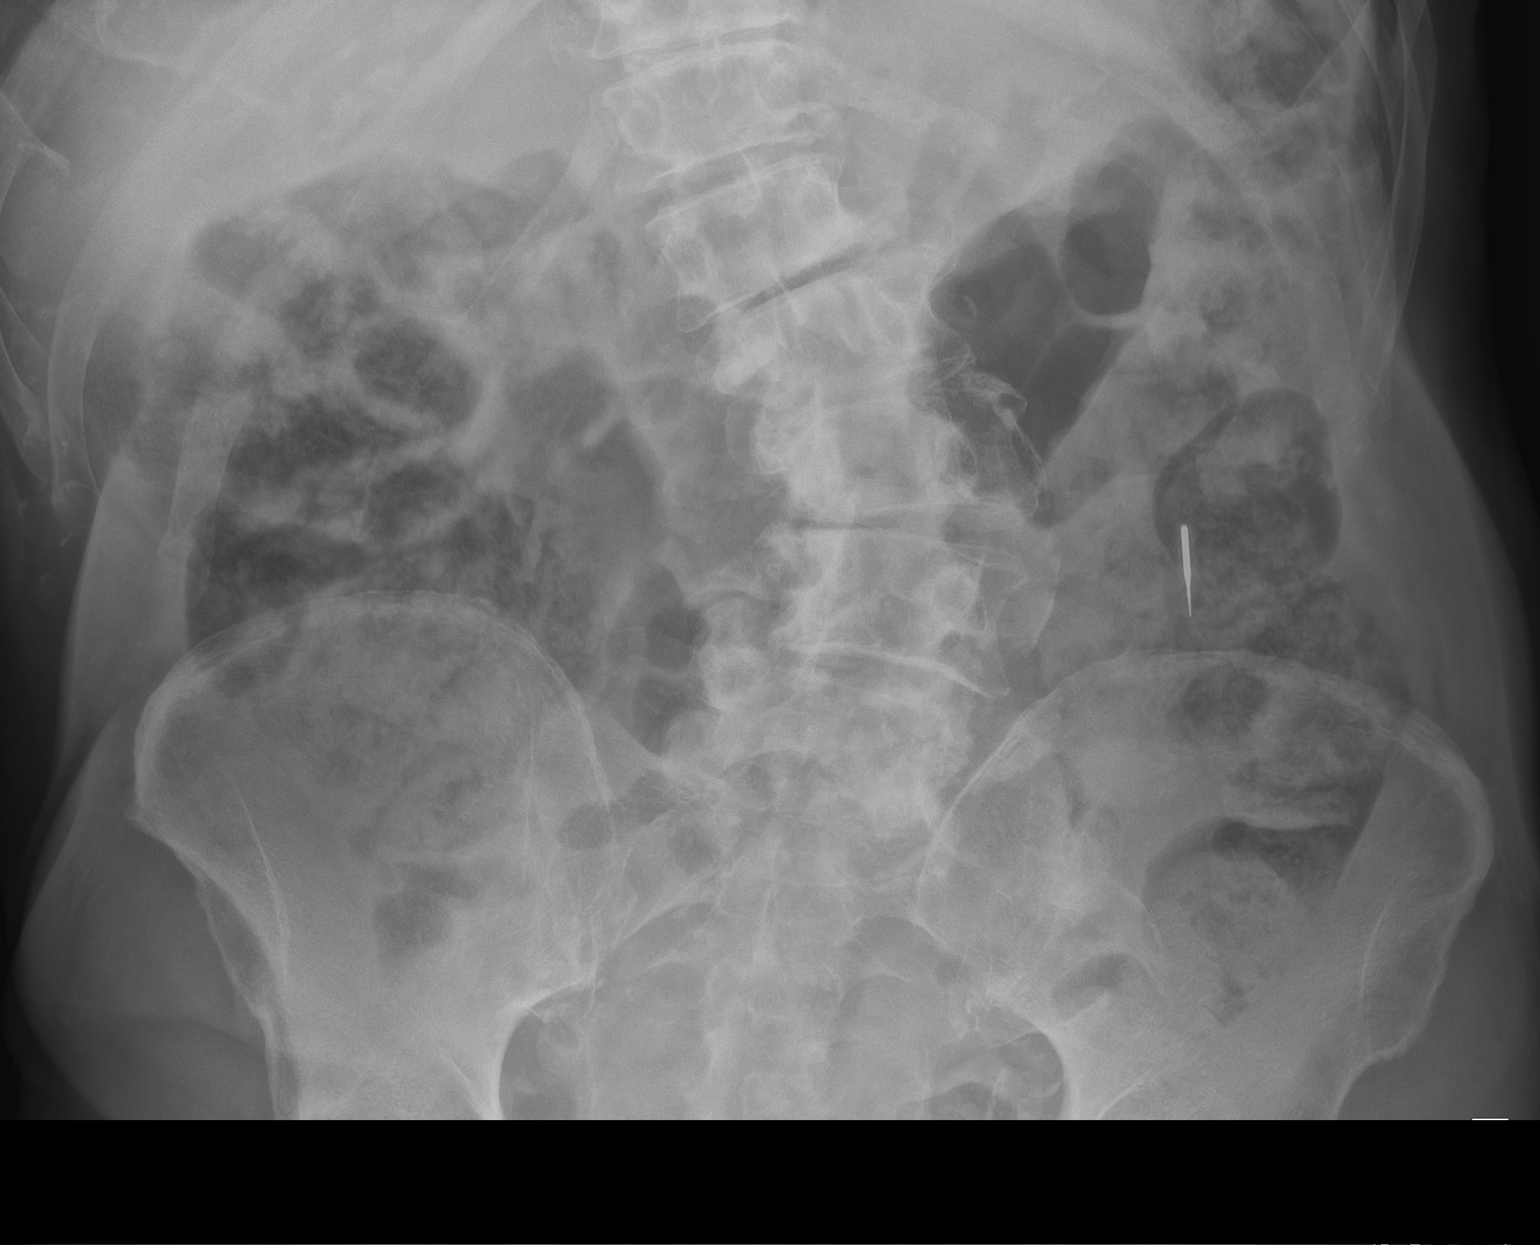

[abdomen supine (3 of 3)]
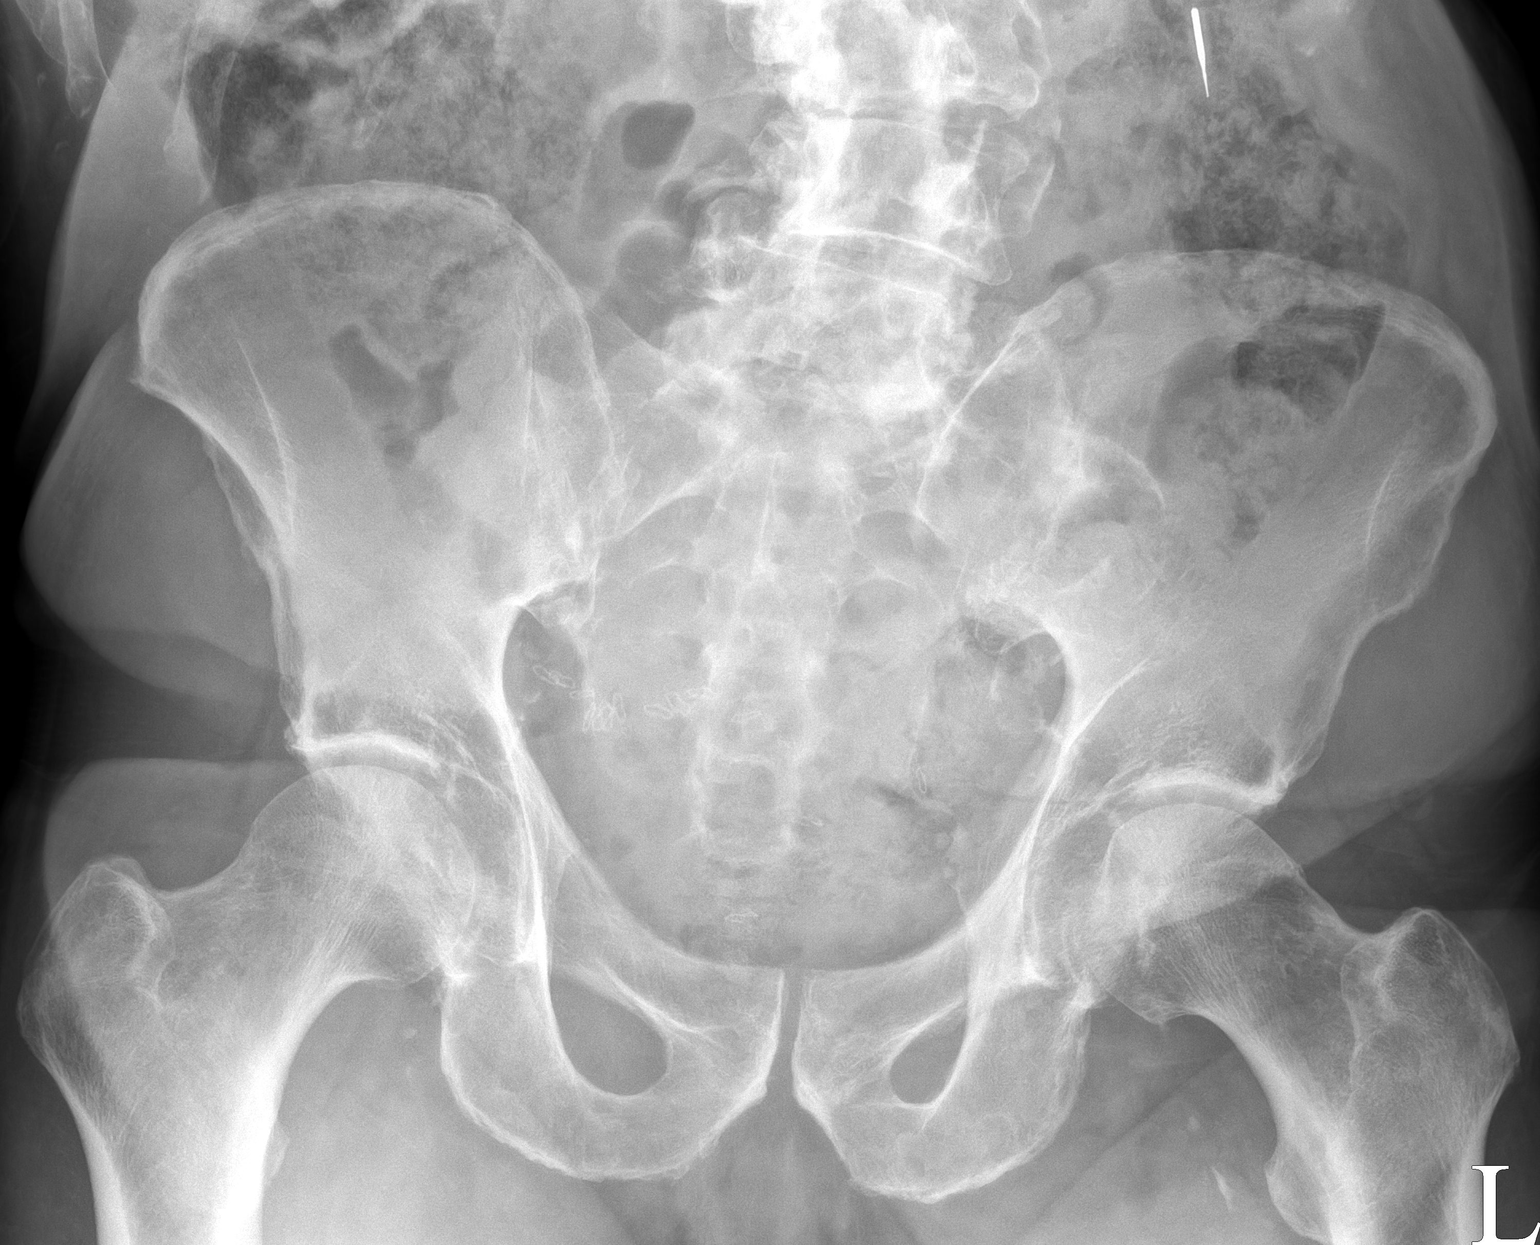

[3 of 3 positions shown; findings below may reference images not displayed]

FINDINGS: A single linear metallic density is identified. This overlies the
descending colon. The second foreign body is no longer seen.
IMPRESSION: Radiopaque foreign body overlies the descending colon. Second
foreign body is no longer seen.

## 2023-06-07 ENCOUNTER — Telehealth: Payer: Self-pay | Admitting: Internal Medicine

## 2023-06-07 DIAGNOSIS — Z860100 Personal history of colon polyps, unspecified: Secondary | ICD-10-CM

## 2023-06-07 NOTE — Telephone Encounter (Signed)
 Copied from CRM [id number]. Topic: Referral - Request for Referral >> Jun 07, 2023 10:32 AM Leot[place] ORN wrote: Did the patient discuss referral with their provider in the last year? Yes (If No - schedule appointment) (If Yes - send message)  Appointment offered? Yes  Type of order/referral and detailed reason for visit: colonoscopy  Preference of office, provider, location:  If referral order, have you been seen by this specialty before? Yes (If Yes, this issue or another issue? When? Where? Patient was seen by Dr. Jerrell Sol at [facility] in June 2022, patient is scheduled for his 3 year follow up due to history of polyps for June 2025,  however, he is no longer wanting to go through Dr. Jerrell Sol at [facility] he is wanting to be seen with in the [facility] since he has swapped to Dr. Geofm at his PCP for the last year and a half, he wants everything to remain in house within [place] if possible  Can we respond through MyChart? Yes

## 2023-06-08 NOTE — Telephone Encounter (Signed)
 Referral ordered

## 2023-06-08 NOTE — Addendum Note (Signed)
 Addended by: Colene Dauphin on: 06/08/2023 02:53 PM   Modules accepted: Orders

## 2023-06-12 ENCOUNTER — Encounter: Payer: Self-pay | Admitting: Internal Medicine

## 2023-06-14 DIAGNOSIS — G20C Parkinsonism, unspecified: Secondary | ICD-10-CM | POA: Diagnosis not present

## 2023-06-14 DIAGNOSIS — Z8673 Personal history of transient ischemic attack (TIA), and cerebral infarction without residual deficits: Secondary | ICD-10-CM | POA: Diagnosis not present

## 2023-06-19 DIAGNOSIS — C61 Malignant neoplasm of prostate: Secondary | ICD-10-CM | POA: Diagnosis not present

## 2023-06-27 DIAGNOSIS — N393 Stress incontinence (female) (male): Secondary | ICD-10-CM | POA: Diagnosis not present

## 2023-06-27 DIAGNOSIS — C61 Malignant neoplasm of prostate: Secondary | ICD-10-CM | POA: Diagnosis not present

## 2023-06-27 DIAGNOSIS — N5201 Erectile dysfunction due to arterial insufficiency: Secondary | ICD-10-CM | POA: Diagnosis not present

## 2023-07-08 DIAGNOSIS — H0012 Chalazion right lower eyelid: Secondary | ICD-10-CM | POA: Diagnosis not present

## 2023-07-21 DIAGNOSIS — I8311 Varicose veins of right lower extremity with inflammation: Secondary | ICD-10-CM | POA: Diagnosis not present

## 2023-07-21 DIAGNOSIS — L570 Actinic keratosis: Secondary | ICD-10-CM | POA: Diagnosis not present

## 2023-07-21 DIAGNOSIS — Z85828 Personal history of other malignant neoplasm of skin: Secondary | ICD-10-CM | POA: Diagnosis not present

## 2023-07-21 DIAGNOSIS — I8312 Varicose veins of left lower extremity with inflammation: Secondary | ICD-10-CM | POA: Diagnosis not present

## 2023-07-21 DIAGNOSIS — I872 Venous insufficiency (chronic) (peripheral): Secondary | ICD-10-CM | POA: Diagnosis not present

## 2023-07-21 DIAGNOSIS — L300 Nummular dermatitis: Secondary | ICD-10-CM | POA: Diagnosis not present

## 2023-07-21 DIAGNOSIS — H00012 Hordeolum externum right lower eyelid: Secondary | ICD-10-CM | POA: Diagnosis not present

## 2023-07-21 DIAGNOSIS — L304 Erythema intertrigo: Secondary | ICD-10-CM | POA: Diagnosis not present

## 2023-07-21 DIAGNOSIS — L821 Other seborrheic keratosis: Secondary | ICD-10-CM | POA: Diagnosis not present

## 2023-08-22 DIAGNOSIS — H401133 Primary open-angle glaucoma, bilateral, severe stage: Secondary | ICD-10-CM | POA: Diagnosis not present

## 2023-08-28 ENCOUNTER — Telehealth: Payer: Self-pay | Admitting: Internal Medicine

## 2023-08-28 NOTE — Telephone Encounter (Unsigned)
 Copied from CRM [id number]. Topic: Referral - Request for Referral >> Aug 25, 2023  4:40 PM [place] wrote: Did the patient discuss referral with their provider in the last year? Yes (If No - schedule appointment) (If Yes - send message)  Appointment offered? No  Type of order/referral and detailed reason for visit: GI doctor for colonoscopy  Preference of office, provider, location: [place]  If referral order, have you been seen by this specialty before? Yes (If Yes, this issue or another issue? When? Where?Dr Honey Lusty at [place]  Can we respond through MyChart? No

## 2023-08-30 ENCOUNTER — Other Ambulatory Visit: Payer: Self-pay

## 2023-08-30 DIAGNOSIS — Z860100 Personal history of colon polyps, unspecified: Secondary | ICD-10-CM

## 2023-08-30 DIAGNOSIS — Z1211 Encounter for screening for malignant neoplasm of colon: Secondary | ICD-10-CM

## 2023-08-30 NOTE — Telephone Encounter (Signed)
 Copied from CRM [id number]. Topic: Referral - Request for Referral >> Aug 30, 2023 11:59 AM Melvin [name] wrote: Patient called back and I read the message to him and he stated he want to go to the same place his wife went to which is  [facility]

## 2023-08-30 NOTE — Telephone Encounter (Signed)
 Reordered referral today.

## 2023-08-30 NOTE — Telephone Encounter (Signed)
 Left message for spouse for her to confirm if patient wanted [facility] or to go to [place] (previous message had one location in message then different location for requested)

## 2023-09-07 DIAGNOSIS — I872 Venous insufficiency (chronic) (peripheral): Secondary | ICD-10-CM | POA: Diagnosis not present

## 2023-09-07 DIAGNOSIS — I8311 Varicose veins of right lower extremity with inflammation: Secondary | ICD-10-CM | POA: Diagnosis not present

## 2023-09-07 DIAGNOSIS — I8312 Varicose veins of left lower extremity with inflammation: Secondary | ICD-10-CM | POA: Diagnosis not present

## 2023-09-07 DIAGNOSIS — L718 Other rosacea: Secondary | ICD-10-CM | POA: Diagnosis not present

## 2023-09-07 DIAGNOSIS — L821 Other seborrheic keratosis: Secondary | ICD-10-CM | POA: Diagnosis not present

## 2023-09-07 DIAGNOSIS — Z85828 Personal history of other malignant neoplasm of skin: Secondary | ICD-10-CM | POA: Diagnosis not present

## 2023-09-08 ENCOUNTER — Telehealth (INDEPENDENT_AMBULATORY_CARE_PROVIDER_SITE_OTHER): Admitting: Nurse Practitioner

## 2023-09-08 VITALS — Ht 73.0 in | Wt 185.0 lb

## 2023-09-08 DIAGNOSIS — J019 Acute sinusitis, unspecified: Secondary | ICD-10-CM | POA: Insufficient documentation

## 2023-09-08 MED ORDER — AZITHROMYCIN 250 MG PO TABS: ORAL_TABLET | ORAL | 0 refills | Status: AC

## 2023-09-08 MED ORDER — CETIRIZINE HCL 10 MG PO TABS: 10.0000 mg | ORAL_TABLET | Freq: Every day | ORAL | 0 refills | Status: AC

## 2023-09-08 MED ORDER — FLUTICASONE PROPIONATE 50 MCG/ACT NA SUSP: 2.0000 | Freq: Every day | NASAL | 1 refills | Status: DC

## 2023-09-08 NOTE — Progress Notes (Signed)
   Established Patient Office Visit  An audio/visual tele-health visit was completed today for this patient. I connected with  [name] on 09/08/23 utilizing audio/visual technology and verified that I am speaking with the correct person using two identifiers. The patient was located at their home, and I was located at the office of [facility] at [facility] during the encounter. I discussed the limitations of evaluation and management by telemedicine. The patient expressed understanding and agreed to proceed.     Subjective   Patient ID: Melvin [name], male    DOB: 04-08-1946  Age: 78 y.o. MRN: [id number]  Chief Complaint  Patient presents with   Sinusitis    Patient states he is having a lot pressure on his face, some congestion and low grade fever. He says it started about a week ago maybe a little less. He is only taking Tylenol     Arrives for acute visit for the above. Symptom onset 1 week ago.  He is experiencing chills, fatigue, nasal congestion, maxillary sinus discomfort, and headache.  No cough or shortness of breath.  He reports history of sinus infections and feels like experiencing a sinus infection now.  Treating symptoms with Tylenol  at home without improvement.     Review of Systems  Constitutional:  Positive for chills and malaise/fatigue.  HENT:  Positive for congestion.   Respiratory:  Negative for cough and shortness of breath.   Neurological:  Positive for headaches.      Objective:     Ht 6\' 1"  (1.854 m)   Wt 185 lb (83.9 kg)   BMI 24.41 kg/m    Physical Exam Comprehensive physical exam not completed today as office visit was conducted remotely.  Patient appears stable over video, no signs of acute respiratory distress..  Patient was alert and oriented, and appeared to have appropriate judgment.   No results found for any visits on 09/08/23.    The 10-year ASCVD risk score (Arnett DK, et al., 2019) is: 32.5%    Assessment & Plan:    Problem List Items Addressed This Visit       Respiratory   Acute non-recurrent sinusitis - Primary   Acute Concern for bacterial etiology. Treat with course of azithromycin, Flonase  nasal spray, and antihistamine. Call office if symptoms persist or worsen.      Relevant Medications   azithromycin (ZITHROMAX) 250 MG tablet   fluticasone  (FLONASE ) 50 MCG/ACT nasal spray   cetirizine (ZYRTEC) 10 MG tablet    Return if symptoms worsen or fail to improve.    Zorita Hiss, NP

## 2023-09-08 NOTE — Assessment & Plan Note (Signed)
 Acute Concern for bacterial etiology. Treat with course of azithromycin, Flonase  nasal spray, and antihistamine. Call office if symptoms persist or worsen.

## 2023-09-09 ENCOUNTER — Emergency Department (HOSPITAL_COMMUNITY)

## 2023-09-09 ENCOUNTER — Encounter (HOSPITAL_COMMUNITY): Payer: Self-pay | Admitting: Emergency Medicine

## 2023-09-09 ENCOUNTER — Emergency Department (HOSPITAL_COMMUNITY)
Admission: EM | Admit: 2023-09-09 | Discharge: 2023-09-09 | Disposition: A | Discharge status: Home / Self Care | Attending: Emergency Medicine | Admitting: Emergency Medicine

## 2023-09-09 ENCOUNTER — Other Ambulatory Visit: Payer: Self-pay

## 2023-09-09 DIAGNOSIS — R509 Fever, unspecified: Secondary | ICD-10-CM | POA: Diagnosis not present

## 2023-09-09 DIAGNOSIS — R531 Weakness: Secondary | ICD-10-CM | POA: Diagnosis not present

## 2023-09-09 DIAGNOSIS — G20C Parkinsonism, unspecified: Secondary | ICD-10-CM | POA: Diagnosis not present

## 2023-09-09 DIAGNOSIS — R0989 Other specified symptoms and signs involving the circulatory and respiratory systems: Secondary | ICD-10-CM | POA: Diagnosis not present

## 2023-09-09 DIAGNOSIS — R9431 Abnormal electrocardiogram [ECG] [EKG]: Secondary | ICD-10-CM | POA: Diagnosis not present

## 2023-09-09 DIAGNOSIS — I10 Essential (primary) hypertension: Secondary | ICD-10-CM | POA: Diagnosis not present

## 2023-09-09 DIAGNOSIS — R059 Cough, unspecified: Secondary | ICD-10-CM | POA: Diagnosis not present

## 2023-09-09 LAB — URINALYSIS, W/ REFLEX TO CULTURE (INFECTION SUSPECTED)
Bacteria, UA: NONE SEEN
Bilirubin Urine: NEGATIVE
Glucose, UA: NEGATIVE mg/dL
Hgb urine dipstick: NEGATIVE
Ketones, ur: 20 mg/dL — AB
Leukocytes,Ua: NEGATIVE
Nitrite: NEGATIVE
Protein, ur: 30 mg/dL — AB
Specific Gravity, Urine: 1.028 (ref 1.005–1.030)
pH: 5 (ref 5.0–8.0)

## 2023-09-09 LAB — RESP PANEL BY RT-PCR (RSV, FLU A&B, COVID)  RVPGX2
Influenza A by PCR: NEGATIVE
Influenza B by PCR: NEGATIVE
Resp Syncytial Virus by PCR: NEGATIVE
SARS Coronavirus 2 by RT PCR: NEGATIVE

## 2023-09-09 LAB — CBC WITH DIFFERENTIAL/PLATELET
Abs Immature Granulocytes: 0.03 10*3/uL (ref 0.00–0.07)
Basophils Absolute: 0 10*3/uL (ref 0.0–0.1)
Basophils Relative: 0 %
Eosinophils Absolute: 0.1 10*3/uL (ref 0.0–0.5)
Eosinophils Relative: 1 %
HCT: 43.7 % (ref 39.0–52.0)
Hemoglobin: 14.4 g/dL (ref 13.0–17.0)
Immature Granulocytes: 1 %
Lymphocytes Relative: 8 %
Lymphs Abs: 0.4 10*3/uL — ABNORMAL LOW (ref 0.7–4.0)
MCH: 33 pg (ref 26.0–34.0)
MCHC: 33 g/dL (ref 30.0–36.0)
MCV: 100 fL (ref 80.0–100.0)
Monocytes Absolute: 0.3 10*3/uL (ref 0.1–1.0)
Monocytes Relative: 6 %
Neutro Abs: 3.9 10*3/uL (ref 1.7–7.7)
Neutrophils Relative %: 84 %
Platelets: 143 10*3/uL — ABNORMAL LOW (ref 150–400)
RBC: 4.37 MIL/uL (ref 4.22–5.81)
RDW: 12.6 % (ref 11.5–15.5)
WBC: 4.6 10*3/uL (ref 4.0–10.5)
nRBC: 0 % (ref 0.0–0.2)

## 2023-09-09 LAB — COMPREHENSIVE METABOLIC PANEL WITH GFR
ALT: 22 U/L (ref 0–44)
AST: 39 U/L (ref 15–41)
Albumin: 3.5 g/dL (ref 3.5–5.0)
Alkaline Phosphatase: 64 U/L (ref 38–126)
Anion gap: 8 (ref 5–15)
BUN: 26 mg/dL — ABNORMAL HIGH (ref 8–23)
CO2: 22 mmol/L (ref 22–32)
Calcium: 8.4 mg/dL — ABNORMAL LOW (ref 8.9–10.3)
Chloride: 105 mmol/L (ref 98–111)
Creatinine, Ser: 1.1 mg/dL (ref 0.61–1.24)
GFR, Estimated: >60 mL/min (ref >60)
Glucose, Bld: 111 mg/dL — ABNORMAL HIGH (ref 70–99)
Potassium: 4.1 mmol/L (ref 3.5–5.1)
Sodium: 135 mmol/L (ref 135–145)
Total Bilirubin: 0.9 mg/dL (ref 0.0–1.2)
Total Protein: 6.2 g/dL — ABNORMAL LOW (ref 6.5–8.1)

## 2023-09-09 LAB — PROTIME-INR
INR: 1.2 (ref 0.8–1.2)
Prothrombin Time: 15.8 s — ABNORMAL HIGH (ref 11.4–15.2)

## 2023-09-09 LAB — I-STAT CG4 LACTIC ACID, ED: Lactic Acid, Venous: 0.7 mmol/L (ref 0.5–1.9)

## 2023-09-09 MED ORDER — LACTATED RINGERS IV BOLUS
1000.0000 mL | Freq: Once | INTRAVENOUS | Status: AC
  Administered 2023-09-09: 1000 mL via INTRAVENOUS

## 2023-09-09 MED ORDER — IBUPROFEN 200 MG PO TABS
400.0000 mg | ORAL_TABLET | Freq: Once | ORAL | Status: AC
  Administered 2023-09-09: 400 mg via ORAL
  Filled 2023-09-09: qty 2

## 2023-09-09 MED ORDER — FAMOTIDINE 20 MG PO TABS
20.0000 mg | ORAL_TABLET | Freq: Once | ORAL | Status: AC
  Administered 2023-09-09: 20 mg via ORAL
  Filled 2023-09-09: qty 1

## 2023-09-09 MED ORDER — CARBIDOPA-LEVODOPA 25-100 MG PO TABS
1.0000 | ORAL_TABLET | Freq: Once | ORAL | Status: AC
  Administered 2023-09-09: 1 via ORAL
  Filled 2023-09-09: qty 1

## 2023-09-09 MED ORDER — AMOXICILLIN-POT CLAVULANATE 875-125 MG PO TABS: 1.0000 | ORAL_TABLET | Freq: Two times a day (BID) | ORAL | 0 refills | Status: DC

## 2023-09-09 MED ORDER — AMOXICILLIN-POT CLAVULANATE 875-125 MG PO TABS: 1.0000 | ORAL_TABLET | Freq: Once | ORAL | Status: DC

## 2023-09-09 NOTE — ED Triage Notes (Signed)
 Pt bib EMS from home with c/o fever and flu like symptoms with weakness and headache. Pt states fever started few days ago. Denies N/V/D.

## 2023-09-09 NOTE — Discharge Instructions (Addendum)
 We are adding Augmentin to your antibiotic regimen.  Continue to take the azithromycin as prescribed.  Take ibuprofen  and Tylenol  in alternating fashion to help with fever.  Be sure to drink increased fluids as sickness and fever can increase dehydration.  If you develop new or worsening headache, weakness, neck stiffness, the fever is not going away in 48 hours, or any other new/concerning symptoms then return to the ER.  Otherwise follow-up with your primary care doctor.

## 2023-09-09 NOTE — ED Notes (Signed)
 Pt was able to ambulate with a walker and assistance. Pt had to take multiple breaks to due the walk to and from the bathroom.

## 2023-09-09 NOTE — ED Provider Notes (Signed)
 [place] EMERGENCY DEPARTMENT AT [facility] Provider Note   CSN: [id number] Arrival date & time: 09/09/23  1813     History  Chief Complaint  Patient presents with   Fever    Melvin [name] is a 78 y.o. male.  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  He had sepsis back in 2023 from a leg cellulitis.  Wife reports that he had a small wound to his right leg that the dermatologist said was okay when they saw them 2 days ago.  However he started having some cough, congestion, sinus pressure yesterday as well as a low-grade fever.  Was getting a little weak with walking.  Had a virtual visit and was put on azithromycin.  Has had a little diarrhea since being put on this and then today developed high fever and worse weakness with walking and even some transient confusion that is resolved.  Patient's had a little bit of a headache but also sinus pressure.  He denies sore throat, chest pain, shortness of breath, neck stiffness, vomiting, or urinary symptoms.  He received Tylenol  about 1 hour prior to arrival.  Patient's generalized weakness and mental status seem to be back closer to normal according to the wife.  Home Medications Prior to Admission medications   Medication Sig Start Date End Date Taking? Authorizing Provider  amoxicillin -clavulanate (AUGMENTIN) 875-125 MG tablet Take 1 tablet by mouth every 12 (twelve) hours. 09/09/23  Yes Jerilynn Montenegro, MD  acetaminophen  (TYLENOL ) 500 MG tablet Take 500 mg by mouth every 6 (six) hours as needed for moderate pain.    [provider]  azithromycin (ZITHROMAX) 250 MG tablet Take 2 tablets on day 1, then 1 tablet daily on days 2 through 5 09/08/23 09/13/23  Zorita Hiss, NP  b complex vitamins capsule Take 1 capsule by mouth daily.    [provider]  carbidopa -levodopa  (SINEMET  IR) 25-100 MG tablet Take 1/2 tab BY MOUTH THREE TIMES DAILY 12/06/22   [provider]  cetirizine (ZYRTEC) 10 MG tablet Take 1 tablet (10 mg total) by mouth daily. 09/08/23   Gray, Sarah E, NP  cimetidine (TAGAMET) 200 MG tablet Take 200 mg by mouth 2 (two) times daily.    [provider]  dorzolamidel-timolol (COSOPT PF) 22.3-6.8 MG/ML SOLN ophthalmic solution Place 1 drop into both eyes 2 (two) times daily.    [provider]  EDEX 20 MCG injection 20 mcg by Intracavitary route as needed for erectile dysfunction. 09/16/21   [provider]  famotidine  (PEPCID ) 20 MG tablet Take 10 mg by mouth 2 (two) times daily. 05/10/22   [provider]  fluocinonide  cream (LIDEX ) 0.05 % APPLY TO THE AFFECTED AREA ONCE A DAY. 02/28/23   Colene Dauphin, MD  fluticasone  (FLONASE ) 50 MCG/ACT nasal spray Place 2 sprays into both nostrils daily. 09/08/23   Zorita Hiss, NP  ibuprofen  (ADVIL ) 200 MG tablet Take 200 mg by mouth every 8 (eight) hours as needed for moderate pain.    [provider]  linaclotide  (LINZESS ) 290 MCG CAPS capsule TAKE ONE CAPSULE BY MOUTH BEFORE BREAKFAST AS NEEDED FOR CONSTIPATION 10/12/22   Burns, Beckey Bourgeois, MD  MAGNESIUM GLYCINATE PO Take by mouth.    [provider]  Multiple Vitamin (MULTIVITAMIN WITH MINERALS) TABS tablet Take 1 tablet by mouth daily with lunch.    [provider]  phenylephrine  (NEO-SYNEPHRINE) 1 % nasal spray Place 1  drop into both nostrils as needed for congestion.    [provider]  Tafluprost, PF, (ZIOPTAN) 0.0015 % SOLN Place 1 drop into both eyes at bedtime.    [provider]      Allergies    Sulfa antibiotics    Review of Systems   Review of Systems  Constitutional:  Positive for fever.  HENT:  Positive for congestion and sinus pressure. Negative for sore throat.   Respiratory:  Positive for cough. Negative for shortness of breath.   Cardiovascular:  Negative for chest pain.  Gastrointestinal:  Positive for diarrhea. Negative for abdominal pain and  vomiting.  Genitourinary:  Negative for dysuria.  Neurological:  Positive for weakness.    Physical Exam Updated Vital Signs BP (!) 155/73   Pulse 78   Temp 99.6 F (37.6 C) (Oral)   Resp 20   Ht 6\' 1"  (1.854 m)   Wt 83.9 kg   SpO2 97%   BMI 24.41 kg/m  Physical Exam Vitals and nursing note reviewed.  Constitutional:      General: He is not in acute distress.    Appearance: He is well-developed. He is not ill-appearing or diaphoretic.  HENT:     Head: Normocephalic and atraumatic.  Cardiovascular:     Rate and Rhythm: Normal rate and regular rhythm.     Heart sounds: Normal heart sounds.  Pulmonary:     Effort: Pulmonary effort is normal.     Breath sounds: Normal breath sounds.  Abdominal:     General: There is no distension.     Palpations: Abdomen is soft.     Tenderness: There is no abdominal tenderness.  Musculoskeletal:     Cervical back: Normal range of motion. No rigidity.  Skin:    General: Skin is warm and dry.     Comments: Small scab a few cm proximal to his right ankle. No cellulitis.  Neurological:     Mental Status: He is alert.     Comments: Awake, alert, clear speech. Acting appropriate, able to provide his own history. 5/5 strength in all 4 extremities.      ED Results / Procedures / Treatments   Labs (all labs ordered are listed, but only abnormal results are displayed) Labs Reviewed  COMPREHENSIVE METABOLIC PANEL WITH GFR - Abnormal; Notable for the following components:      Result Value   Glucose, Bld 111 (*)    BUN 26 (*)    Calcium 8.4 (*)    Total Protein 6.2 (*)    All other components within normal limits  CBC WITH DIFFERENTIAL/PLATELET - Abnormal; Notable for the following components:   Platelets 143 (*)    Lymphs Abs 0.4 (*)    All other components within normal limits  PROTIME-INR - Abnormal; Notable for the following components:   Prothrombin Time 15.8 (*)    All other components within normal limits  URINALYSIS, W/ REFLEX  TO CULTURE (INFECTION SUSPECTED) - Abnormal; Notable for the following components:   Ketones, ur 20 (*)    Protein, ur 30 (*)    All other components within normal limits  RESP PANEL BY RT-PCR (RSV, FLU A&B, COVID)  RVPGX2  CULTURE, BLOOD (ROUTINE X 2)  CULTURE, BLOOD (ROUTINE X 2)  I-STAT CG4 LACTIC ACID, ED    EKG EKG Interpretation Date/Time:  Saturday Sep 09 2023 18:58:31 EDT Ventricular Rate:  58 PR Interval:  169 QRS Duration:  91 QT Interval:  415 QTC Calculation: 408 R  Axis:   -29  Text Interpretation: Sinus rhythm Atrial premature complex Sinus pause Borderline left axis deviation Probable anteroseptal infarct, old no acute ST/T changes Confirmed by Jerilynn Montenegro [phone]) on 09/09/2023 7:36:47 PM  Radiology DG Chest 2 View Result Date: 09/09/2023 CLINICAL DATA:  cough, fever EXAM: CHEST - 2 VIEW COMPARISON:  None Available. FINDINGS: The heart and mediastinal contours are unchanged. Atherosclerotic plaque. Low lung volumes. Likely atelectasis at the left base. No definite focal consolidation. No pulmonary edema. No pleural effusion. No pneumothorax. No acute osseous abnormality. IMPRESSION: Low lung volumes with no active cardiopulmonary disease. Electronically Signed   By: Morgane  Naveau M.D.   On: 09/09/2023 21:39    Procedures Procedures    Medications Ordered in ED Medications  amoxicillin -clavulanate (AUGMENTIN) 875-125 MG per tablet 1 tablet (has no administration in time range)  lactated ringers  bolus 1,000 mL (0 mLs Intravenous Stopped 09/09/23 2021)  ibuprofen  (ADVIL ) tablet 400 mg (400 mg Oral Given 09/09/23 2020)  famotidine  (PEPCID ) tablet 20 mg (20 mg Oral Given 09/09/23 2021)  carbidopa -levodopa  (SINEMET  IR) 25-100 MG per tablet immediate release 1 tablet (1 tablet Oral Given 09/09/23 2114)    ED Course/ Medical Decision Making/ A&P                                 Medical Decision Making Amount and/or Complexity of Data Reviewed External Data  Reviewed: notes. Labs: ordered.    Details: Normal WBC, normal lactate Radiology: ordered and independent interpretation performed.    Details: No lobar pneumonia  ECG/medicine tests: ordered and independent interpretation performed.    Details: No ischemia  Risk OTC drugs. Prescription drug management.   Patient presents with fever and sinus/URI symptoms.  He is overall well-appearing.  He received Tylenol  prior to arrival and was given fluids and later some ibuprofen .  Labs are reassuring including normal WBC, renal function, lactate, etc.  He does have some mild ketones and a mild BUN elevation consistent with some mild dehydration.  He was given a liter of fluid.  He is feeling better and his strength is better and he is able to ambulate near his baseline with his walker.  He feels well enough for discharge.  He does have a little bit of a headache but suspect this is more fever related as he is not altered at this time, no meningismus, etc.  It sounds more like a viral process versus sinusitis versus occult pneumonia.  I think all of these would be covered with an addition of Augmentin to his azithromycin.  Will have him follow-up closely with his PCP.  Cultures were sent but at this time there is no evidence of sepsis.  He has a history of cellulitis but his legs look fine at this point and there is no obvious other skin pathology.  At this point, I think he is stable for discharge, will give return precautions.        Final Clinical Impression(s) / ED Diagnoses Final diagnoses:  Fever in adult    Rx / DC Orders ED Discharge Orders          Ordered    amoxicillin -clavulanate (AUGMENTIN) 875-125 MG tablet  Every 12 hours        09/09/23 2152              Jerilynn Montenegro, MD 09/09/23 2205

## 2023-09-11 ENCOUNTER — Ambulatory Visit: Admitting: Family Medicine

## 2023-09-11 ENCOUNTER — Telehealth: Payer: Self-pay

## 2023-09-11 NOTE — Telephone Encounter (Signed)
 Copied from CRM [id number]. Topic: Referral - Request for Referral >> Sep 11, 2023  9:03 AM Albertha Alosa wrote: Did the patient discuss referral with their provider in the last year? No (If No - schedule appointment) (If Yes - send message)  Appointment offered? Yes , patient wife stated he is very sick and patient has already spoke to someone in Dr.Burns office on Friday  [phone] Type of order/referral and detailed reason for visit: Infectious Disease Specialist   Preference of office, provider, location: No Preference   If referral order, have you been seen by this specialty before? No (If Yes, this issue or another issue? When? Where?  Can we respond through MyChart? Yes

## 2023-09-12 NOTE — Telephone Encounter (Signed)
 Message sent to patient via my-chart.  Needs to schedule office visit for follow up so appropriate referral and evaluation can be done.

## 2023-09-14 ENCOUNTER — Encounter: Payer: Self-pay | Admitting: Internal Medicine

## 2023-09-14 LAB — CULTURE, BLOOD (ROUTINE X 2)
Culture: NO GROWTH
Culture: NO GROWTH

## 2023-09-14 NOTE — Progress Notes (Unsigned)
 Subjective:    Patient ID: Melvin [name], male    DOB: [birth year], 78 y.o.   MRN: [id number]      HPI Torsten is here for No chief complaint on file.   VV 5/9 with Sarah - acute sinusitis.  Rx'd zpak, flonase , antihistamine.    5/10 - ED for fever.  They were concerned about sepsis.  Symptoms started day prior - cough, sinus pressure, congestion and low grade fever.  He had mild weakness. He had some diarrhea with the zpak, had a high fever, sinus pressure, HA,worse weakness and some transient confusion. In ED weakness and mental status was at baseline. Resp panel, cxr, cmp, cbc, blood cx, lactic acid, EKG, blood cx, cxr , ua all negative.  He was given IVF.  He was prescribed Augmentin to take with the zpak.   Has VV with neurology tonight - he has been freezing when walking and can't get his legs moving.      Medications and allergies reviewed with patient and updated if appropriate.  Current Outpatient Medications on File Prior to Visit  Medication Sig Dispense Refill   acetaminophen  (TYLENOL ) 500 MG tablet Take 500 mg by mouth every 6 (six) hours as needed for moderate pain.     amoxicillin -clavulanate (AUGMENTIN) 875-125 MG tablet Take 1 tablet by mouth every 12 (twelve) hours. 13 tablet 0   b complex vitamins capsule Take 1 capsule by mouth daily.     carbidopa -levodopa  (SINEMET  IR) 25-100 MG tablet Take 1/2 tab BY MOUTH THREE TIMES DAILY     cetirizine (ZYRTEC) 10 MG tablet Take 1 tablet (10 mg total) by mouth daily. 30 tablet 0   cimetidine (TAGAMET) 200 MG tablet Take 200 mg by mouth 2 (two) times daily.     dorzolamidel-timolol (COSOPT PF) 22.3-6.8 MG/ML SOLN ophthalmic solution Place 1 drop into both eyes 2 (two) times daily.     EDEX 20 MCG injection 20 mcg by Intracavitary route as needed for erectile dysfunction.     famotidine  (PEPCID ) 20 MG tablet Take 10 mg by mouth 2 (two) times daily.     fluocinonide  cream (LIDEX ) 0.05 % APPLY TO THE AFFECTED AREA ONCE A DAY. 30 g  2   fluticasone  (FLONASE ) 50 MCG/ACT nasal spray Place 2 sprays into both nostrils daily. 16 g 1   ibuprofen  (ADVIL ) 200 MG tablet Take 200 mg by mouth every 8 (eight) hours as needed for moderate pain.     linaclotide  (LINZESS ) 290 MCG CAPS capsule TAKE ONE CAPSULE BY MOUTH BEFORE BREAKFAST AS NEEDED FOR CONSTIPATION 30 capsule 5   MAGNESIUM GLYCINATE PO Take by mouth.     Multiple Vitamin (MULTIVITAMIN WITH MINERALS) TABS tablet Take 1 tablet by mouth daily with lunch.     phenylephrine  (NEO-SYNEPHRINE) 1 % nasal spray Place 1 drop into both nostrils as needed for congestion.     Tafluprost, PF, (ZIOPTAN) 0.0015 % SOLN Place 1 drop into both eyes at bedtime.     No current facility-administered medications on file prior to visit.    Review of Systems     Objective:  There were no vitals filed for this visit. BP Readings from Last 3 Encounters:  09/09/23 (!) 155/73  02/24/23 130/88  08/24/22 (!) 172/102   Wt Readings from Last 3 Encounters:  09/09/23 185 lb (83.9 kg)  09/08/23 185 lb (83.9 kg)  02/24/23 196 lb (88.9 kg)   There is no height or weight on file to calculate BMI.  Physical Exam       Lab Results  Component Value Date   WBC 4.6 09/09/2023   HGB 14.4 09/09/2023   HCT 43.7 09/09/2023   PLT 143 (L) 09/09/2023   GLUCOSE 111 (H) 09/09/2023   CHOL 173 02/22/2023   TRIG 62.0 02/22/2023   HDL 70.80 02/22/2023   LDLCALC 90 02/22/2023   ALT 22 09/09/2023   AST 39 09/09/2023   NA 135 09/09/2023   K 4.1 09/09/2023   CL 105 09/09/2023   CREATININE 1.10 09/09/2023   BUN 26 (H) 09/09/2023   CO2 22 09/09/2023   TSH 1.51 02/15/2022   INR 1.2 09/09/2023   HGBA1C 5.7 02/22/2023    DG Chest 2 View CLINICAL DATA:  cough, fever  EXAM: CHEST - 2 VIEW  COMPARISON:  None Available.  FINDINGS: The heart and mediastinal contours are unchanged. Atherosclerotic plaque.  Low lung volumes. Likely atelectasis at the left base. No definite focal consolidation. No  pulmonary edema. No pleural effusion. No pneumothorax.  No acute osseous abnormality.  IMPRESSION: Low lung volumes with no active cardiopulmonary disease.  Electronically Signed   By: Morgane  Naveau M.D.   On: 09/09/2023 21:39    Assessment & Plan:    See Problem List for Assessment and Plan of chronic medical problems.

## 2023-09-15 ENCOUNTER — Ambulatory Visit: Admitting: Internal Medicine

## 2023-09-15 VITALS — BP 130/82 | HR 101 | Temp 98.0°F | Ht 73.0 in | Wt 185.0 lb

## 2023-09-15 DIAGNOSIS — G20C Parkinsonism, unspecified: Secondary | ICD-10-CM

## 2023-09-15 DIAGNOSIS — J019 Acute sinusitis, unspecified: Secondary | ICD-10-CM | POA: Diagnosis not present

## 2023-09-15 DIAGNOSIS — R894 Abnormal immunological findings in specimens from other organs, systems and tissues: Secondary | ICD-10-CM | POA: Diagnosis not present

## 2023-09-15 DIAGNOSIS — Z8673 Personal history of transient ischemic attack (TIA), and cerebral infarction without residual deficits: Secondary | ICD-10-CM | POA: Diagnosis not present

## 2023-09-15 NOTE — Patient Instructions (Addendum)
      Medications changes include :   None    A referral was ordered allergy-immunology and someone will call you to schedule an appointment.     Return if symptoms worsen or fail to improve.

## 2023-09-15 NOTE — Assessment & Plan Note (Addendum)
 Acute Symptoms are improving He has completed the Z-Pak-discussed that still working in his system Continue Augmentin as prescribed Will complete Augmentin tomorrow Discussed that at this time no additional antibiotics are needed His symptoms are improving and they will not resolve completely when he finishes the antibiotics, which should continue to improve over the next week or so Discussed there is no guarantee that he developed sepsis but that is extremely uncommon with a sinus infection and he is being treated appropriately Advised him to call if his symptoms are not continuing to improve or worsen

## 2023-09-15 NOTE — Assessment & Plan Note (Signed)
 Chronic Following with neurology When he got sick he was having more freezing episodes and confusion Discussed that is not uncommon for any sickness to trigger that Will discuss with neurology tonight at their virtual visit his freezing episodes

## 2023-09-15 NOTE — Assessment & Plan Note (Signed)
 Concerned over abnormality of his immune system Last year he developed significant sepsis from cellulitis that was unsuccessfully treated as an outpatient They are concerned about this recurring He also had a very severe eye infection His wife is terrified that he is going to get sepsis again and is concerned why he had 103 fever with this sinus infection and if there is something wrong with his immune system I do not think there is anything wrong with his immune system, but I do think they need some reassurance Has not had issues with getting over illnesses prior to last year Will refer to immunology for their opinion

## 2023-09-18 ENCOUNTER — Encounter: Payer: Self-pay | Admitting: Internal Medicine

## 2023-09-18 NOTE — Progress Notes (Signed)
PRE-PROCEDURE EXAM: Bilateral TM's cannot be visualized due to total occlusion/impaction of the ear canal.  PROCEDURE INDICATION: remove wax to visualize ear drum & relieve discomfort  CONSENT:  Verbal    PROCEDURE NOTE:   RIGHT EAR:  The CMA irrigated the ear canal with warm water to remove the wax.   LEFT EAR:  The CMA irrigated the ear canal with warm water to remove the wax.   POST- PROCEDURE EXAM: B/l TMs successfully visualized and found to have no erythema.  Right ear canal with scant remaining cerumen.  Left ear canal with no remaining cerumen.    Patient tolerated the procedure well and had relief of their symptoms after the procedure.

## 2023-09-27 ENCOUNTER — Telehealth: Payer: Self-pay | Admitting: Internal Medicine

## 2023-09-27 NOTE — Telephone Encounter (Signed)
 Good morning Dr. Rosaline Coma,   We received a call from this patient wishing to schedule a colonoscopy. Patient last had a colonoscopy in 09/2020 and 08/2015 with Dr. Honey Lusty at [facility]. He states his wife recently had a procedure done with you and was satisfied so now he would like to have the same provider as her. He would also like all of his providers under [facility]. Records from procedures were scanned into Media for you to review. Would you please advise on scheduling?  Thank you.

## 2023-09-27 NOTE — Telephone Encounter (Signed)
 Okay to schedule for direct colonoscopy in [facility] for history of colon polyps. Would consider a two day prep if patient is amenable since he had a fair preparation on his last colonoscopy. Either that or taking Miralax daily for 7 days before his next colonoscopy.  Colonoscopy 08/13/15: Normal. Good prep. Repeat recommended in 5 years.  Colonoscopy 10/12/20: Fair preparation. 2 4-6 mm polyps in the ascending colon removed with hot snare. 2 1-3 mm polyps in the ascending colon removed with biopsy forceps, One 3 mm polyp in the transverse colon removed with forceps. Internal hemorrhoids. 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 Repeat recommended in 3 years.

## 2023-09-30 ENCOUNTER — Encounter: Payer: Self-pay | Admitting: Internal Medicine

## 2023-11-28 ENCOUNTER — Ambulatory Visit (AMBULATORY_SURGERY_CENTER): Admitting: *Deleted

## 2023-11-28 VITALS — Ht 73.0 in | Wt 185.0 lb

## 2023-11-28 DIAGNOSIS — Z860100 Personal history of colon polyps, unspecified: Secondary | ICD-10-CM

## 2023-11-28 MED ORDER — NA SULFATE-K SULFATE-MG SULF 17.5-3.13-1.6 GM/177ML PO SOLN: 1.0000 | Freq: Once | ORAL | 0 refills | Status: AC

## 2023-11-28 NOTE — Progress Notes (Signed)
 Pt's name and DOB verified at the beginning of the pre-visit wit 2 identifiers  Permission given to speak with wife given by pt  Pt denies any difficulty with ambulating,sitting, laying down or rolling side to side  Pt has no issues moving head neck or swallowing  No egg or soy allergy known to patient   No issues known to pt with past sedation with any surgeries or procedures  No FH of Malignant Hyperthermia  Pt is not on home 02   Pt is not on blood thinners   Pt has frequent issues with constipation RN instructed pt to use Miralax per bottles instructions a week before prep days. Pt states they will  Pt is not on dialysis  Pt denise any abnormal heart rhythms   Pt denies any upcoming cardiac testing  Patient's chart reviewed by Norleen Schillings CNRA prior to pre-visit and patient appropriate for the [facility].  Pre-visit completed and red dot placed by patient's name on their procedure day (on provider's schedule).    Visit by phone  Pt states weight is 185 lb  IInstructions reviewed. Pt given  both [facility] main # and MD on call # prior to instructions.  Pt states understanding of instructions. Instructed pt to review instructions again prior to procedure and call main # given if has questions.. Pt states they will.   Instructed pt on where to find instructions on My Chart.

## 2023-11-29 ENCOUNTER — Encounter: Payer: Self-pay | Admitting: Internal Medicine

## 2023-11-29 DIAGNOSIS — L821 Other seborrheic keratosis: Secondary | ICD-10-CM | POA: Diagnosis not present

## 2023-11-29 DIAGNOSIS — L570 Actinic keratosis: Secondary | ICD-10-CM | POA: Diagnosis not present

## 2023-11-29 DIAGNOSIS — Z85828 Personal history of other malignant neoplasm of skin: Secondary | ICD-10-CM | POA: Diagnosis not present

## 2023-11-29 DIAGNOSIS — S80811A Abrasion, right lower leg, initial encounter: Secondary | ICD-10-CM | POA: Diagnosis not present

## 2023-12-06 ENCOUNTER — Encounter: Admitting: Internal Medicine

## 2023-12-11 ENCOUNTER — Encounter: Admitting: Internal Medicine

## 2023-12-18 DIAGNOSIS — C61 Malignant neoplasm of prostate: Secondary | ICD-10-CM | POA: Diagnosis not present

## 2023-12-19 ENCOUNTER — Telehealth: Payer: Self-pay | Admitting: Gastroenterology

## 2023-12-19 DIAGNOSIS — Z860100 Personal history of colon polyps, unspecified: Secondary | ICD-10-CM

## 2023-12-19 MED ORDER — NA SULFATE-K SULFATE-MG SULF 17.5-3.13-1.6 GM/177ML PO SOLN: 1.0000 | Freq: Once | ORAL | 0 refills | Status: AC

## 2023-12-19 NOTE — Telephone Encounter (Signed)
Please update prep instructions. 

## 2023-12-19 NOTE — Telephone Encounter (Signed)
 Verified with 2 ID Informed pt that new set of instructions with new dater have been sent his My Chart and that his medication request is in the pharmcy he desired computer and can be picked up even today. No other questions at this time.

## 2023-12-24 ENCOUNTER — Encounter: Payer: Self-pay | Admitting: Internal Medicine

## 2023-12-25 ENCOUNTER — Telehealth: Admitting: Internal Medicine

## 2023-12-25 ENCOUNTER — Encounter: Payer: Self-pay | Admitting: Internal Medicine

## 2023-12-25 DIAGNOSIS — J019 Acute sinusitis, unspecified: Secondary | ICD-10-CM | POA: Diagnosis not present

## 2023-12-25 MED ORDER — AMOXICILLIN-POT CLAVULANATE 875-125 MG PO TABS: 1.0000 | ORAL_TABLET | Freq: Two times a day (BID) | ORAL | 0 refills | Status: AC

## 2023-12-25 NOTE — Progress Notes (Signed)
 Virtual Visit via Video Note  I connected with Melvin [name] on 12/25/23 at 10:00 AM EDT by a video enabled telemedicine application and verified that I am speaking with the correct person using two identifiers.   I discussed the limitations of evaluation and management by telemedicine and the availability of in person appointments. The patient expressed understanding and agreed to proceed.  Present for the visit:  Myself, Dr Glade Hope, Melvin [name].  The patient is currently at home and I am in the office.    No referring provider.    History of Present Illness: He is here for an acute visit for cold symptoms.  His symptoms started 7-10 days ago  He is experiencing sneezing, postnasal drainage, mild ear discomfort, nosebleeds, nasal congestion, sinus pressure, mild headaches.  He is not aware of any fevers and denies cough, wheeze and shortness of breath.  He does have a history of sinus infections.  Initially he thought it could be allergies, but there has been no change with allergy medication.  He has tried taking Tylenol , flonase , zyrtec , bacitracin  ointment in the nose   Review of Systems  Constitutional:  Negative for fever.  HENT:  Positive for congestion, ear pain (mild), nosebleeds and sinus pain. Negative for sore throat.        Sneezing, PND  Respiratory:  Negative for cough, shortness of breath and wheezing.   Neurological:  Positive for headaches (mild). Negative for dizziness.      Social History   Socioeconomic History   Marital status: Married    Spouse name: Not on file   Number of children: Not on file   Years of education: Not on file   Highest education level: Not on file  Occupational History   Not on file  Tobacco Use   Smoking status: Former    Current packs/day: 0.00    Types: Cigarettes    Quit date: 09/12/1992    Years since quitting: 31.3   Smokeless tobacco: Never  Vaping Use   Vaping status: Never Used  Substance and Sexual Activity    Alcohol use: Not Currently   Drug use: Never   Sexual activity: Not on file  Other Topics Concern   Not on file  Social History Narrative   Are you right handed or left handed? Right   Are you currently employed ? [employer]    What is your current occupation?   Do you live at home alone? wife   Who lives with you?    What type of home do you live in: 1 story or 2 story? Two but don't go up       Social Drivers of Health   Financial Resource Strain: Not on file  Food Insecurity: Low Risk  (11/09/2022)   Received from [facility]   Hunger Vital Sign    Within the past 12 months, you worried that your food would run out before you got money to buy more: Never true    Within the past 12 months, the food you bought just didn't last and you didn't have money to get more. : Never true  Transportation Needs: Not on file (11/09/2022)  Physical Activity: Not on file  Stress: Not on file  Social Connections: Not on file     Observations/Objective: Appears well in NAD Breathing normally and speaking in full sentences Skin appears warm and dry  Assessment and Plan:  See Problem [name] for Assessment and Plan of chronic medical problems.  Follow Up Instructions:    I discussed the assessment and treatment plan with the patient. The patient was provided an opportunity to ask questions and all were answered. The patient agreed with the plan and demonstrated an understanding of the instructions.   The patient was advised to call back or seek an in-person evaluation if the symptoms worsen or if the condition fails to improve as anticipated.    Glade JINNY Hope, MD

## 2023-12-25 NOTE — Patient Instructions (Signed)
      Blood work was ordered.       Medications changes include :   None    A referral was ordered and someone will call you to schedule an appointment.     No follow-ups on file.

## 2023-12-25 NOTE — Assessment & Plan Note (Signed)
 Acute Concern for bacterial cause given lack of improvement with allergy medications and duration of symptoms He also has history of a sinus infection Start Augmentin  875-125 mg BID x 10 day Start saline nasal spray for nosebleeds in addition to the bacitracin  ointment in the nose otc allergy medications Rest, fluid Call if no improvement

## 2023-12-26 DIAGNOSIS — C61 Malignant neoplasm of prostate: Secondary | ICD-10-CM | POA: Diagnosis not present

## 2023-12-26 DIAGNOSIS — N5201 Erectile dysfunction due to arterial insufficiency: Secondary | ICD-10-CM | POA: Diagnosis not present

## 2023-12-26 DIAGNOSIS — N393 Stress incontinence (female) (male): Secondary | ICD-10-CM | POA: Diagnosis not present

## 2023-12-26 DIAGNOSIS — K4091 Unilateral inguinal hernia, without obstruction or gangrene, recurrent: Secondary | ICD-10-CM | POA: Diagnosis not present

## 2023-12-27 DIAGNOSIS — H029 Unspecified disorder of eyelid: Secondary | ICD-10-CM | POA: Diagnosis not present

## 2023-12-27 DIAGNOSIS — H04129 Dry eye syndrome of unspecified lacrimal gland: Secondary | ICD-10-CM | POA: Diagnosis not present

## 2023-12-27 DIAGNOSIS — H401133 Primary open-angle glaucoma, bilateral, severe stage: Secondary | ICD-10-CM | POA: Diagnosis not present

## 2024-01-04 ENCOUNTER — Telehealth: Payer: Self-pay | Admitting: Internal Medicine

## 2024-01-04 NOTE — Telephone Encounter (Unsigned)
 Copied from CRM [id number]. Topic: Clinical - Medication Refill >> Jan 04, 2024  4:32 PM Tiffany S wrote: Medication: amoxicillin -clavulanate (AUGMENTIN ) 875-125 MG tablet [[id number]]  Has the patient contacted their pharmacy? Yes (Agent: If no, request that the patient contact the pharmacy for the refill. If patient does not wish to contact the pharmacy document the reason why and proceed with request.) (Agent: If yes, when and what did the pharmacy advise?)  This is the patient's preferred pharmacy:  [facility] [place], [place] - [address] [place] [address] Phone: [phone] Fax: [phone]  Is this the correct pharmacy for this prescription? Yes If no, delete pharmacy and type the correct one.   Has the prescription been filled recently? Yes  Is the patient out of the medication? Yes  Has the patient been seen for an appointment in the last year OR does the patient have an upcoming appointment? Yes  Can we respond through MyChart? Yes  Agent: Please be advised that Rx refills may take up to 3 business days. We ask that you follow-up with your pharmacy.

## 2024-01-05 ENCOUNTER — Telehealth (INDEPENDENT_AMBULATORY_CARE_PROVIDER_SITE_OTHER): Admitting: Internal Medicine

## 2024-01-05 ENCOUNTER — Encounter: Payer: Self-pay | Admitting: Internal Medicine

## 2024-01-05 ENCOUNTER — Telehealth: Payer: Self-pay | Admitting: Radiology

## 2024-01-05 DIAGNOSIS — J329 Chronic sinusitis, unspecified: Secondary | ICD-10-CM | POA: Diagnosis not present

## 2024-01-05 MED ORDER — DOXYCYCLINE HYCLATE 100 MG PO TABS
100.0000 mg | ORAL_TABLET | Freq: Two times a day (BID) | ORAL | 0 refills | Status: AC
Start: 2024-01-05 — End: 2024-01-12

## 2024-01-05 NOTE — Progress Notes (Signed)
 Virtual Visit via Video Note  I connected with Melvin [name] on 01/05/24 at  3:20 PM EDT by a video enabled telemedicine application and verified that I am speaking with the correct person using two identifiers.   I discussed the limitations of evaluation and management by telemedicine and the availability of in person appointments. The patient expressed understanding and agreed to proceed.  Present for the visit:  Myself, Dr Glade Hope, Melvin [name].  The patient is currently at home and I am in the office.    No referring provider.    History of Present Illness: He is here for an acute visit.  We had a telehealth visit on 8/25 for sinus infection.  He did complete Augmentin  875-125 mg twice daily x 10 days.  There was some improvement in his sinus infection, but symptoms have persisted.  He is taking Zyrtec  daily, using saline nasal sprays, another over-the-counter nasal spray and applying bacitracin  into his nose.  After the antibiotic he did have decrease postnasal drainage, sneezing and headaches.  He is still experiencing sinus pain, nasal congestion, cough from the drain and nausea.  He feels like the infection has improved but is still persistent and feels strongly that he needs another antibiotic.    Review of Systems  Constitutional:  Negative for fever.  HENT:  Positive for congestion and sinus pain. Negative for ear pain and sore throat.        PND  Respiratory:  Positive for cough (pnd). Negative for sputum production and wheezing.   Gastrointestinal:  Positive for nausea.  Neurological:  Positive for headaches (slight). Negative for dizziness.     Social History   Socioeconomic History   Marital status: Married    Spouse name: Not on file   Number of children: Not on file   Years of education: Not on file   Highest education level: Not on file  Occupational History   Not on file  Tobacco Use   Smoking status: Former    Current packs/day: 0.00    Types: Cigarettes     Quit date: 09/12/1992    Years since quitting: 31.3   Smokeless tobacco: Never  Vaping Use   Vaping status: Never Used  Substance and Sexual Activity   Alcohol use: Not Currently   Drug use: Never   Sexual activity: Not on file  Other Topics Concern   Not on file  Social History Narrative   Are you right handed or left handed? Right   Are you currently employed ? [employer]    What is your current occupation?   Do you live at home alone? wife   Who lives with you?    What type of home do you live in: 1 story or 2 story? Two but don't go up       Social Drivers of Health   Financial Resource Strain: Not on file  Food Insecurity: Low Risk  (11/09/2022)   Received from [facility]   Hunger Vital Sign    Within the past 12 months, you worried that your food would run out before you got money to buy more: Never true    Within the past 12 months, the food you bought just didn't last and you didn't have money to get more. : Never true  Transportation Needs: Not on file (11/09/2022)  Physical Activity: Not on file  Stress: Not on file  Social Connections: Not on file     Observations/Objective: Appears well in NAD  Breathing normally and speaking in full sentences Skin appears warm and dry  Assessment and Plan:  See Problem [name] for Assessment and Plan of chronic medical problems.   Follow Up Instructions:    I discussed the assessment and treatment plan with the patient. The patient was provided an opportunity to ask questions and all were answered. The patient agreed with the plan and demonstrated an understanding of the instructions.   The patient was advised to call back or seek an in-person evaluation if the symptoms worsen or if the condition fails to improve as anticipated.    Glade JINNY Hope, MD

## 2024-01-05 NOTE — Telephone Encounter (Signed)
 Copied from CRM [id number]. Topic: Clinical - Medication Question >> Jan 05, 2024 10:11 AM Berneda FALCON wrote: Reason for CRM: Pt wanted to know if we can renew his Augmentin  for him as he still has sinus infection-congestion. If  not, can we call him in something else so he can finish this sickness and be done.  [facility] [place], [place] - [address] [place] [address] Phone: [phone] Fax: [phone] Hours: Not open 24 hours  Patient callback is [phone]

## 2024-01-05 NOTE — Telephone Encounter (Signed)
Virtual appointment made for today.

## 2024-01-05 NOTE — Telephone Encounter (Signed)
 VV appointment made for today.

## 2024-01-05 NOTE — Assessment & Plan Note (Signed)
 Chronic Persistent sinus infection despite 10 days of Augmentin  Difficult for me to say if Melvin [name] still has an existing sinus infection I will give an additional 10 days of an antibiotic-doxycycline  100 mg twice daily Continues Zyrtec  nightly, saline nasal spray Advised putting Vaseline in his nose at night-discussed that the bloody noses are not related to infection ingestion dryness Melvin [name] is very concerned about being possibly immunodeficient or having difficulty fighting off infections which it is hard to tell if that is the case or not Referral to ENT in the event this does not clear up the sinus issues

## 2024-01-05 NOTE — Patient Instructions (Addendum)
 SABRA

## 2024-01-09 DIAGNOSIS — G20C Parkinsonism, unspecified: Secondary | ICD-10-CM | POA: Diagnosis not present

## 2024-01-11 DIAGNOSIS — L570 Actinic keratosis: Secondary | ICD-10-CM | POA: Diagnosis not present

## 2024-01-11 DIAGNOSIS — Z85828 Personal history of other malignant neoplasm of skin: Secondary | ICD-10-CM | POA: Diagnosis not present

## 2024-01-11 DIAGNOSIS — H00014 Hordeolum externum left upper eyelid: Secondary | ICD-10-CM | POA: Diagnosis not present

## 2024-01-11 DIAGNOSIS — L814 Other melanin hyperpigmentation: Secondary | ICD-10-CM | POA: Diagnosis not present

## 2024-01-11 DIAGNOSIS — L821 Other seborrheic keratosis: Secondary | ICD-10-CM | POA: Diagnosis not present

## 2024-01-11 DIAGNOSIS — D692 Other nonthrombocytopenic purpura: Secondary | ICD-10-CM | POA: Diagnosis not present

## 2024-01-18 NOTE — Telephone Encounter (Signed)
 Spoke with pt all questions answered

## 2024-01-18 NOTE — Telephone Encounter (Signed)
 Patient requesting f/u call in regards to prep

## 2024-01-22 ENCOUNTER — Ambulatory Visit (AMBULATORY_SURGERY_CENTER): Admitting: Gastroenterology

## 2024-01-22 ENCOUNTER — Telehealth: Payer: Self-pay | Admitting: Gastroenterology

## 2024-01-22 ENCOUNTER — Ambulatory Visit: Payer: Self-pay

## 2024-01-22 ENCOUNTER — Encounter: Payer: Self-pay | Admitting: Gastroenterology

## 2024-01-22 VITALS — BP 168/91 | HR 65 | Temp 98.8°F | Resp 11 | Ht 73.0 in | Wt 185.0 lb

## 2024-01-22 DIAGNOSIS — K6389 Other specified diseases of intestine: Secondary | ICD-10-CM | POA: Diagnosis not present

## 2024-01-22 DIAGNOSIS — Z860101 Personal history of adenomatous and serrated colon polyps: Secondary | ICD-10-CM

## 2024-01-22 DIAGNOSIS — K648 Other hemorrhoids: Secondary | ICD-10-CM | POA: Diagnosis not present

## 2024-01-22 DIAGNOSIS — R002 Palpitations: Secondary | ICD-10-CM

## 2024-01-22 DIAGNOSIS — G20A1 Parkinson's disease without dyskinesia, without mention of fluctuations: Secondary | ICD-10-CM | POA: Diagnosis not present

## 2024-01-22 DIAGNOSIS — Z1211 Encounter for screening for malignant neoplasm of colon: Secondary | ICD-10-CM | POA: Diagnosis not present

## 2024-01-22 DIAGNOSIS — Z860100 Personal history of colon polyps, unspecified: Secondary | ICD-10-CM

## 2024-01-22 MED ORDER — PEG 3350-KCL-NA BICARB-NACL 420 G PO SOLR: 4000.0000 mL | Freq: Once | ORAL | 0 refills | Status: AC

## 2024-01-22 MED ORDER — SODIUM CHLORIDE 0.9 % IV SOLN: 500.0000 mL | Freq: Once | INTRAVENOUS | Status: DC

## 2024-01-22 NOTE — Telephone Encounter (Signed)
 Good afternoon Dr. Charlanne,  I received a call from this patient stating that he was going to cancel his procedure due to having some irregular heat rhythm. Patient is going to schedule for a consult with a cardiologist and whenever he gets the ok from the cardiologist he will call us  back to reschedule. Please advise.   Thank you

## 2024-01-22 NOTE — Addendum Note (Signed)
 Addended by: GEOFM GLADE PARAS on: 01/22/2024 04:49 PM   Modules accepted: Orders

## 2024-01-22 NOTE — Progress Notes (Signed)
 To pacu, VSS. Report to [email or web address].tb

## 2024-01-22 NOTE — Op Note (Signed)
 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 Medicines:                Monitored Anesthesia Care Procedure:                Pre-Anesthesia Assessment:                           - Prior to the procedure, a History and Physical                            was performed, and patient medications and                            allergies were reviewed. The patient's tolerance of                            previous anesthesia was also reviewed. The risks                            and benefits of the procedure and the sedation                            options and risks were discussed with the patient.                            All questions were answered, and informed consent                            was obtained. Prior Anticoagulants: The patient has                            taken no anticoagulant or antiplatelet agents. ASA                            Grade Assessment: III - A patient with severe                            systemic disease. After reviewing the risks and                            benefits, the patient was deemed in satisfactory                            condition to undergo the procedure.  After obtaining informed consent, the colonoscope                            was passed under direct vision. Throughout the                            procedure, the patient's blood pressure, pulse, and                            oxygen saturations were monitored continuously. The                            Olympus Scope SN [id number] was introduced through the                            anus with the  intention of advancing to the cecum.                            The scope was advanced to the mid sigmoid colon                            before the procedure was aborted. Medications were                            given. The colonoscopy was performed with ease. The                            patient tolerated the procedure well. The quality                            of the bowel preparation was poor. No anatomical                            landmarks were photographed. Scope In: 10:29:03 AM Scope Out: 10:35:33 AM Total Procedure Duration: 0 hours 6 minutes 30 seconds  Findings:                 Semi-liquid semi-solid stool was found in the                            entire colon, precluding visualization. Lavage of                            the area was performed, resulting in incomplete                            clearance with fair visualization. Complications:            No immediate complications. Estimated Blood Loss:     Estimated blood loss: none. Impression:               - Preparation of the colon was poor.                           - Stool in  the entire examined colon.                           - No specimens collected. Recommendation:           - Patient has a contact number available for                            emergencies. The signs and symptoms of potential                            delayed complications were discussed with the                            patient. Return to normal activities tomorrow.                            Written discharge instructions were provided to the                            patient.                           - Resume previous diet.                           - Continue present medications.                           - Repeat colonoscopy for surveillance after 2 day                            prep.                           - The findings and recommendations were discussed                            with the patient's family. Lynnie Bring, MD 01/22/2024 10:42:36 AM This report has been signed electronically.

## 2024-01-22 NOTE — Telephone Encounter (Signed)
 Referral was ordered for cardiology.  It would be a good idea for him to come in and see me this week because I do not know how long it will take to get into cardiology.

## 2024-01-22 NOTE — Progress Notes (Signed)
 [facility] History and Physical   Primary Care Physician:  Geofm Glade PARAS, MD   Reason for Procedure:   H/O polyps  Plan:    colon     HPI: Melvin [name] is a 78 y.o. male   Colonoscopy 08/13/15: Normal. Good prep. Repeat recommended in 5 years.   Colonoscopy 10/12/20: Fair preparation. 2 4-6 mm polyps in the ascending colon removed with hot snare. 2 1-3 mm polyps in the ascending colon removed with biopsy forceps, One 3 mm polyp in the transverse colon removed with forceps. Internal hemorrhoids. 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 Repeat recommended in 3 years. Past Medical History:  Diagnosis Date   Arthritis    Cancer (HCC)    Cataract    Constipation    GERD (gastroesophageal reflux disease)    Glaucoma    Parkinson's disease (HCC)    not actually but per pt tendencies    Past Surgical History:  Procedure Laterality Date   COLONOSCOPY     INGUINAL HERNIA REPAIR Right 04/01/2021   Procedure: LAPAROSCOPIC INGUINAL HERNIA;  Surgeon: Rubin Calamity, MD;  Location: [facility] OR;  Service: General;  Laterality: Right;   INGUINAL HERNIA REPAIR Left 07/09/2021   Procedure: OPEN LEFT INGUINAL HERNIA REPAIR WITH MESH;  Surgeon: Rubin Calamity, MD;  Location: [facility] OR;  Service: General;  Laterality: Left;   INSERTION OF MESH Right 04/01/2021   Procedure: INSERTION OF MESH;  Surgeon: Rubin Calamity, MD;  Location: [facility] OR;  Service: General;  Laterality: Right;   INSERTION OF MESH Left 07/09/2021   Procedure: INSERTION OF MESH;  Surgeon: Rubin Calamity, MD;  Location: [facility] OR;  Service: General;  Laterality: Left;   LYMPHADENECTOMY Bilateral 09/12/2012   Procedure: LYMPHADENECTOMY;  Surgeon: Ricardo Likens, MD;  Location: [facility] ORS;  Service: Urology;  Laterality: Bilateral;   ROBOT ASSISTED LAPAROSCOPIC RADICAL PROSTATECTOMY N/A 09/12/2012   Procedure: ROBOTIC ASSISTED LAPAROSCOPIC RADICAL PROSTATECTOMY;  Surgeon: Ricardo Likens, MD;  Location: [facility] ORS;  Service:  Urology;  Laterality: N/A;   TONSILLECTOMY      Prior to Admission medications   Medication Sig Start Date End Date Taking? Authorizing Provider  b complex vitamins capsule Take 1 capsule by mouth daily.   Yes [provider]  carbidopa -levodopa  (SINEMET  IR) 25-100 MG tablet Take 1/2 tab BY MOUTH THREE TIMES DAILY 12/06/22  Yes [provider]  cetirizine  (ZYRTEC ) 10 MG tablet Take 1 tablet (10 mg total) by mouth daily. 09/08/23  Yes Gray, Sarah E, NP  cimetidine (TAGAMET) 200 MG tablet Take 200 mg by mouth 2 (two) times daily.   Yes [provider]  dorzolamidel-timolol (COSOPT PF) 22.3-6.8 MG/ML SOLN ophthalmic solution Place 1 drop into both eyes 2 (two) times daily.   Yes [provider]  doxycycline  (MONODOX ) 100 MG capsule Take 100 mg by mouth 2 (two) times daily. 01/11/24  Yes [provider]  phenylephrine  (NEO-SYNEPHRINE) 1 % nasal spray Place 1 drop into both nostrils as needed for congestion.   Yes [provider]  polyethylene glycol-electrolytes (NULYTELY) 420 g solution Take 4,000 mLs by mouth once for 1 dose. 01/22/24 01/22/24 Yes Charlanne Groom, MD  Tafluprost, PF, (ZIOPTAN) 0.0015 % SOLN Place 1 drop into both eyes at bedtime.   Yes [provider]  acetaminophen  (TYLENOL ) 500 MG tablet Take 500 mg by mouth every 6 (six) hours as needed for moderate pain.    [provider]  EDEX 20 MCG injection 20 mcg by Intracavitary route as needed for  erectile dysfunction. 09/16/21   [provider]  famotidine  (PEPCID ) 20 MG tablet Take 10 mg by mouth 2 (two) times daily. Patient not taking: No sig reported 05/10/22   [provider]  fluocinonide  cream (LIDEX ) 0.05 % APPLY TO THE AFFECTED AREA ONCE A DAY. 02/28/23   Geofm Glade PARAS, MD  fluticasone  (FLONASE ) 50 MCG/ACT nasal spray Place 2 sprays into both nostrils daily. Patient not taking: Reported on 11/28/2023 09/08/23   Elnor Lauraine BRAVO, NP  ibuprofen  (ADVIL ) 200  MG tablet Take 200 mg by mouth every 8 (eight) hours as needed for moderate pain.    [provider]  ketoconazole (NIZORAL) 2 % cream SMARTSIG:sparingly Topical Twice Daily 09/07/23   [provider]  linaclotide  (LINZESS ) 290 MCG CAPS capsule TAKE ONE CAPSULE BY MOUTH BEFORE BREAKFAST AS NEEDED FOR CONSTIPATION Patient not taking: Reported on 11/28/2023 10/12/22   Geofm Glade PARAS, MD  MAGNESIUM GLYCINATE PO Take by mouth.    [provider]  metroNIDAZOLE (METROCREAM) 0.75 % cream SMARTSIG:sparingly Topical Twice Daily 09/07/23   [provider]  Multiple Vitamin (MULTIVITAMIN WITH MINERALS) TABS tablet Take 1 tablet by mouth daily with lunch.    [provider]  mupirocin ointment (BACTROBAN) 2 % SMARTSIG:sparingly Topical Twice Daily Patient not taking: Reported on 01/22/2024 01/11/24   [provider]  triamcinolone  cream (KENALOG ) 0.1 % SMARTSIG:sparingly Topical Twice Daily 09/07/23   [provider]    Current Outpatient Medications  Medication Sig Dispense Refill   b complex vitamins capsule Take 1 capsule by mouth daily.     carbidopa -levodopa  (SINEMET  IR) 25-100 MG tablet Take 1/2 tab BY MOUTH THREE TIMES DAILY     cetirizine  (ZYRTEC ) 10 MG tablet Take 1 tablet (10 mg total) by mouth daily. 30 tablet 0   cimetidine (TAGAMET) 200 MG tablet Take 200 mg by mouth 2 (two) times daily.     dorzolamidel-timolol (COSOPT PF) 22.3-6.8 MG/ML SOLN ophthalmic solution Place 1 drop into both eyes 2 (two) times daily.     doxycycline  (MONODOX ) 100 MG capsule Take 100 mg by mouth 2 (two) times daily.     phenylephrine  (NEO-SYNEPHRINE) 1 % nasal spray Place 1 drop into both nostrils as needed for congestion.     polyethylene glycol-electrolytes (NULYTELY) 420 g solution Take 4,000 mLs by mouth once for 1 dose. 4000 mL 0   Tafluprost, PF, (ZIOPTAN) 0.0015 % SOLN Place 1 drop into both eyes at bedtime.     acetaminophen  (TYLENOL ) 500 MG tablet Take  500 mg by mouth every 6 (six) hours as needed for moderate pain.     EDEX 20 MCG injection 20 mcg by Intracavitary route as needed for erectile dysfunction.     famotidine  (PEPCID ) 20 MG tablet Take 10 mg by mouth 2 (two) times daily. (Patient not taking: No sig reported)     fluocinonide  cream (LIDEX ) 0.05 % APPLY TO THE AFFECTED AREA ONCE A DAY. 30 g 2   fluticasone  (FLONASE ) 50 MCG/ACT nasal spray Place 2 sprays into both nostrils daily. (Patient not taking: Reported on 11/28/2023) 16 g 1   ibuprofen  (ADVIL ) 200 MG tablet Take 200 mg by mouth every 8 (eight) hours as needed for moderate pain.     ketoconazole (NIZORAL) 2 % cream SMARTSIG:sparingly Topical Twice Daily     linaclotide  (LINZESS ) 290 MCG CAPS capsule TAKE ONE CAPSULE BY MOUTH BEFORE BREAKFAST AS NEEDED FOR CONSTIPATION (Patient not taking: Reported on 11/28/2023) 30 capsule 5   MAGNESIUM GLYCINATE  PO Take by mouth.     metroNIDAZOLE (METROCREAM) 0.75 % cream SMARTSIG:sparingly Topical Twice Daily     Multiple Vitamin (MULTIVITAMIN WITH MINERALS) TABS tablet Take 1 tablet by mouth daily with lunch.     mupirocin ointment (BACTROBAN) 2 % SMARTSIG:sparingly Topical Twice Daily (Patient not taking: Reported on 01/22/2024)     triamcinolone  cream (KENALOG ) 0.1 % SMARTSIG:sparingly Topical Twice Daily     No current facility-administered medications for this visit.    Allergies as of 01/22/2024 - Review Complete 01/22/2024  Allergen Reaction Noted   Sulfa antibiotics Hives 08/27/2012    Family History  Problem Relation Age of Onset   Colon cancer Neg Hx    Colon polyps Neg Hx    Esophageal cancer Neg Hx    Rectal cancer Neg Hx    Stomach cancer Neg Hx     Social History   Socioeconomic History   Marital status: Married    Spouse name: Not on file   Number of children: Not on file   Years of education: Not on file   Highest education level: Not on file  Occupational History   Not on file  Tobacco Use   Smoking status:  Former    Current packs/day: 0.00    Types: Cigarettes    Quit date: 09/12/1992    Years since quitting: 31.3   Smokeless tobacco: Never  Vaping Use   Vaping status: Never Used  Substance and Sexual Activity   Alcohol use: Not Currently   Drug use: Never   Sexual activity: Not on file  Other Topics Concern   Not on file  Social History Narrative   Are you right handed or left handed? Right   Are you currently employed ? [employer]    What is your current occupation?   Do you live at home alone? wife   Who lives with you?    What type of home do you live in: 1 story or 2 story? Two but don't go up       Social Drivers of Health   Financial Resource Strain: Not on file  Food Insecurity: Low Risk  (11/09/2022)   Received from [facility]   Hunger Vital Sign    Within the past 12 months, you worried that your food would run out before you got money to buy more: Never true    Within the past 12 months, the food you bought just didn't last and you didn't have money to get more. : Never true  Transportation Needs: Not on file (11/09/2022)  Physical Activity: Not on file  Stress: Not on file  Social Connections: Not on file  Intimate Partner Violence: Not At Risk (04/06/2022)   Humiliation, Afraid, Rape, and Kick questionnaire    Fear of Current or Ex-Partner: No    Emotionally Abused: No    Physically Abused: No    Sexually Abused: No    Review of Systems: Positive for none All other review of systems negative except as mentioned in the HPI.  Physical Exam: Vital signs in last 24 hours: @VSRANGES @   General:   Alert,  Well-developed, well-nourished, pleasant and cooperative in NAD Lungs:  Clear throughout to auscultation.   Heart:  Regular rate and rhythm; no murmurs, clicks, rubs,  or gallops. Abdomen:  Soft, nontender and nondistended. Normal bowel sounds.   Neuro/Psych:  Alert and cooperative. Normal mood and affect. A and O x 3    No significant changes were  identified.  The patient continues to be an appropriate candidate for the planned procedure and anesthesia.   Anselm Bring, MD. [facility] 01/22/2024 11:50 AM@

## 2024-01-22 NOTE — Telephone Encounter (Signed)
 FYI Only or Action Required?: Action required by provider: referral request.  Patient was last seen in primary care on 01/05/2024 by Geofm Glade PARAS, MD.  Called Nurse Triage reporting Palpitations.  Symptoms began today.  Interventions attempted: Nothing.  Symptoms are: rapidly improving.  Triage Disposition: See PCP When Office is Open (Within 3 Days)  Patient/caregiver understands and will follow disposition?: Unsure   Copied from CRM [id number]. Topic: Clinical - Red Word Triage >> Jan 22, 2024  1:17 PM Taleah C wrote: Red Word that prompted transfer to Nurse Triage: went to have colon screening done today and they noticed an irregular heartbeat. Reason for Disposition  Caller requesting an appointment, triage offered and declined    Requesting cardiology referral  Answer Assessment - Initial Assessment Questions 1. DESCRIPTION: Please describe your heart rate or heartbeat that you are having (e.g., fast/slow, regular/irregular, skipped or extra beats, palpitations)     Reported from Dr. Charlanne during colonoscopy irregular heart rate down to 40. Recommended to call pcp for cardiology referral.  2. ONSET: When did it start? (e.g., minutes, hours, days)      Today 3. DURATION: How long does it last (e.g., seconds, minutes, hours)     Returned to normal before end of visit  4. PATTERN Does it come and go, or has it been constant since it started?  Does it get worse with exertion?   Are you feeling it now?     unsure 5. TAP: Using your hand, can you tap out what you are feeling on a chair or table in front of you, so that I can hear? Note: Not all patients can do this.        6. HEART RATE: Can you tell me your heart rate? How many beats in 15 seconds?  Note: Not all patients can do this.       40's while in office back to baseline 60's-70's 7. RECURRENT SYMPTOM: Have you ever had this before? If Yes, ask: When was the last time? and What happened that time?       never 8. CAUSE: What do you think is causing the palpitations?     unknown 9. CARDIAC HISTORY: Do you have any history of heart disease? (e.g., heart attack, angina, bypass surgery, angioplasty, arrhythmia)      no 10. OTHER SYMPTOMS: Do you have any other symptoms? (e.g., dizziness, chest pain, sweating, difficulty breathing)       Denies all symptoms. Never had symptoms while heart rate was low  Answer Assessment - Initial Assessment Questions 1. REASON FOR CALL or QUESTION: What is your reason for calling today? or How can I best      Requesting cardiology referral  Protocols used: Heart Rate and Kaleta Fonder, PCP Call - No Triage-A-AH

## 2024-01-22 NOTE — Progress Notes (Signed)
 Pt's states no medical or surgical changes since previsit or office visit.

## 2024-01-22 NOTE — Patient Instructions (Addendum)
 Clear liquids diet and continue with prep for colonoscopy scheduled for tomorrow. Follow up with Primary care for referral with Cardiology.   YOU HAD AN ENDOSCOPIC PROCEDURE TODAY AT THE  ENDOSCOPY CENTER:   Refer to the procedure report that was given to you for any specific questions about what was found during the examination.  If the procedure report does not answer your questions, please call your gastroenterologist to clarify.  If you requested that your care partner not be given the details of your procedure findings, then the procedure report has been included in a sealed envelope for you to review at your convenience later.  YOU SHOULD EXPECT: Some feelings of bloating in the abdomen. Passage of more gas than usual.  Walking can help get rid of the air that was put into your GI tract during the procedure and reduce the bloating. If you had a lower endoscopy (such as a colonoscopy or flexible sigmoidoscopy) you may notice spotting of blood in your stool or on the toilet paper. If you underwent a bowel prep for your procedure, you may not have a normal bowel movement for a few days.  Please Note:  You might notice some irritation and congestion in your nose or some drainage.  This is from the oxygen used during your procedure.  There is no need for concern and it should clear up in a day or so.  SYMPTOMS TO REPORT IMMEDIATELY:  Following lower endoscopy (colonoscopy or flexible sigmoidoscopy):  Excessive amounts of blood in the stool  Significant tenderness or worsening of abdominal pains  Swelling of the abdomen that is new, acute  Fever of 100F or higher  For urgent or emergent issues, a gastroenterologist can be reached at any hour by calling [phone]. Do not use MyChart messaging for urgent concerns.    DIET:  We do recommend a small meal at first, but then you may proceed to your regular diet.  Drink plenty of fluids but you should avoid alcoholic beverages for 24  hours.  ACTIVITY:  You should plan to take it easy for the rest of today and you should NOT DRIVE or use heavy machinery until tomorrow (because of the sedation medicines used during the test).    FOLLOW UP: Our staff will call the number listed on your records the next business day following your procedure.  We will call around 7:15- 8:00 am to check on you and address any questions or concerns that you may have regarding the information given to you following your procedure. If we do not reach you, we will leave a message.     If any biopsies were taken you will be contacted by phone or by letter within the next 1-3 weeks.  Please call us  at [phone] if you have not heard about the biopsies in 3 weeks.    SIGNATURES/CONFIDENTIALITY: You and/or your care partner have signed paperwork which will be entered into your electronic medical record.  These signatures attest to the fact that that the information above on your After Visit Summary has been reviewed and is understood.  Full responsibility of the confidentiality of this discharge information lies with you and/or your care-partner.

## 2024-01-23 ENCOUNTER — Encounter: Payer: Self-pay | Admitting: Cardiology

## 2024-01-23 ENCOUNTER — Other Ambulatory Visit: Payer: Self-pay

## 2024-01-23 ENCOUNTER — Telehealth: Payer: Self-pay

## 2024-01-23 ENCOUNTER — Encounter: Admitting: Gastroenterology

## 2024-01-23 ENCOUNTER — Ambulatory Visit

## 2024-01-23 ENCOUNTER — Ambulatory Visit: Attending: Cardiology | Admitting: Cardiology

## 2024-01-23 VITALS — BP 108/64 | HR 69 | Ht 73.0 in | Wt 188.8 lb

## 2024-01-23 DIAGNOSIS — M25472 Effusion, left ankle: Secondary | ICD-10-CM | POA: Diagnosis not present

## 2024-01-23 DIAGNOSIS — M25471 Effusion, right ankle: Secondary | ICD-10-CM | POA: Diagnosis not present

## 2024-01-23 DIAGNOSIS — R001 Bradycardia, unspecified: Secondary | ICD-10-CM

## 2024-01-23 DIAGNOSIS — L304 Erythema intertrigo: Secondary | ICD-10-CM | POA: Diagnosis not present

## 2024-01-23 DIAGNOSIS — G20C Parkinsonism, unspecified: Secondary | ICD-10-CM

## 2024-01-23 DIAGNOSIS — R739 Hyperglycemia, unspecified: Secondary | ICD-10-CM

## 2024-01-23 DIAGNOSIS — R002 Palpitations: Secondary | ICD-10-CM

## 2024-01-23 DIAGNOSIS — I6381 Other cerebral infarction due to occlusion or stenosis of small artery: Secondary | ICD-10-CM

## 2024-01-23 DIAGNOSIS — Z85828 Personal history of other malignant neoplasm of skin: Secondary | ICD-10-CM | POA: Diagnosis not present

## 2024-01-23 DIAGNOSIS — R030 Elevated blood-pressure reading, without diagnosis of hypertension: Secondary | ICD-10-CM

## 2024-01-23 DIAGNOSIS — H00011 Hordeolum externum right upper eyelid: Secondary | ICD-10-CM | POA: Diagnosis not present

## 2024-01-23 NOTE — Progress Notes (Signed)
 Cardiology Office Note   Date:  01/23/2024  ID:  Mohsin Crum, DOB 10/18/1945, MRN [id number] PCP: Geofm Glade PARAS, MD  [facility] Providers Cardiologist:  None   History of Present Illness Melvin [name] is a 78 y.o. male with a past medical history of parkinson's disease, history of CVA, HTN, HLD. Patient presents today for evaluation of palpitations   Patient previously had sepsis in 04/2022. 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 He had a colonoscopy completed yesterday. He was told that before the procedure, his HR had been slow, down to 40 BPM. During the procedure, his HR became elevated. He was instructed to ask for a cardiology referral. Patient denies any palpitations. Denies chest pain, shortness of breath, dizziness. No syncope or near syncope. He moves slowly due to his Parkinson's, but is able to go shopping and do housework without anginal symptoms. Wife reports that sometimes he seems more tired than usual.    Studies Reviewed Cardiac Studies & Procedures   ______________________________________________________________________________________________     ECHOCARDIOGRAM  ECHOCARDIOGRAM COMPLETE 04/06/2022  Narrative ECHOCARDIOGRAM REPORT    Patient Name:   Melvin [name] Date of Exam: 04/06/2022 Medical Rec #:  [id number]   Height:       73.0 in Accession #:    [id number]  Weight:       173.8 lb Date of Birth:  01-09-1946   BSA:          2.027 m Patient Age:    76 years    BP:           149/49 mmHg Patient Gender: M           HR:           66 bpm. Exam Location:  Inpatient  Procedure: 2D Echo, Cardiac Doppler and Color Doppler  Indications:    Bacteremia R78.81  History:        Patient has no prior history of  Echocardiogram examinations. Risk Factors:GERD.  Sonographer:    Lauraine Pilot RDCS Referring Phys: [id number] SUMAYYA AMIN   Sonographer Comments: Image acquisition challenging due to respiratory motion. IMPRESSIONS   1. Left ventricular ejection fraction, by estimation, is 55 to 60%. The left ventricle has normal function. The left ventricle has no regional wall motion abnormalities. Left ventricular diastolic parameters were normal. 2. Right ventricular systolic function is normal. The right ventricular size is normal. There is mildly elevated pulmonary artery systolic pressure. The estimated right ventricular systolic pressure is 43.3 mmHg. 3. Right atrial size was mildly dilated. 4. The mitral valve is normal in structure. Trivial mitral valve regurgitation. No evidence of mitral stenosis. 5. The aortic valve is tricuspid. Aortic valve regurgitation is not visualized. No aortic stenosis is present. 6. Aortic dilatation noted. There is mild dilatation of the aortic root, measuring 40 mm. 7. The inferior vena cava is normal in size with <50% respiratory variability, suggesting right atrial pressure of 8 mmHg. 8. No definite vegetation noted. If suspicion is high, need TEE.  FINDINGS Left Ventricle: Left ventricular ejection fraction, by estimation, is 55 to 60%. The left ventricle has normal function. The left ventricle has no regional wall motion abnormalities. The left ventricular internal cavity size was normal in size. There is no left ventricular hypertrophy. Left ventricular diastolic  parameters were normal.  Right Ventricle: The right ventricular size is normal. No increase in right ventricular wall thickness. Right ventricular systolic function is normal. There is mildly elevated pulmonary artery systolic pressure. The tricuspid regurgitant velocity is 2.97 m/s, and with an assumed right atrial pressure of 8 mmHg, the estimated right ventricular systolic pressure is 43.3  mmHg.  Left Atrium: Left atrial size was normal in size.  Right Atrium: Right atrial size was mildly dilated.  Pericardium: There is no evidence of pericardial effusion.  Mitral Valve: The mitral valve is normal in structure. There is mild calcification of the mitral valve leaflet(s). Mild mitral annular calcification. Trivial mitral valve regurgitation. No evidence of mitral valve stenosis.  Tricuspid Valve: The tricuspid valve is normal in structure. Tricuspid valve regurgitation is trivial.  Aortic Valve: The aortic valve is tricuspid. Aortic valve regurgitation is not visualized. No aortic stenosis is present.  Pulmonic Valve: The pulmonic valve was normal in structure. Pulmonic valve regurgitation is trivial.  Aorta: Aortic dilatation noted. There is mild dilatation of the aortic root, measuring 40 mm.  Venous: The inferior vena cava is normal in size with less than 50% respiratory variability, suggesting right atrial pressure of 8 mmHg.  IAS/Shunts: No atrial level shunt detected by color flow Doppler.   LEFT VENTRICLE PLAX 2D LVIDd:         4.90 cm      Diastology LVIDs:         3.10 cm      LV e' medial:    8.11 cm/s LV PW:         1.00 cm      LV E/e' medial:  11.0 LV IVS:        1.00 cm      LV e' lateral:   11.80 cm/s LVOT diam:     2.20 cm      LV E/e' lateral: 7.6 LV SV:         76 LV SV Index:   37 LVOT Area:     3.80 cm  LV Volumes (MOD) LV vol d, MOD A2C: 112.0 ml LV vol d, MOD A4C: 126.0 ml LV vol s, MOD A2C: 46.6 ml LV vol s, MOD A4C: 49.3 ml LV SV MOD A2C:     65.4 ml LV SV MOD A4C:     126.0 ml LV SV MOD BP:      71.2 ml  RIGHT VENTRICLE RV S prime:     25.20 cm/s TAPSE (M-mode): 2.6 cm  LEFT ATRIUM             Index        RIGHT ATRIUM           Index LA diam:        4.30 cm 2.12 cm/m   RA Area:     19.40 cm LA Vol (A2C):   60.3 ml 29.75 ml/m  RA Volume:   52.10 ml  25.70 ml/m LA Vol (A4C):   55.3 ml 27.28 ml/m LA Biplane Vol: 60.6 ml  29.90 ml/m AORTIC VALVE             PULMONIC VALVE LVOT Vmax:   104.00 cm/s PR End Diast Vel: 3.44 msec LVOT Vmean:  72.000 cm/s LVOT VTI:    0.199 m  AORTA Ao Root diam: 4.00 cm Ao Asc diam:  3.70 cm  MITRAL VALVE               TRICUSPID VALVE  MV Area (PHT): 3.17 cm    TR Peak grad:   35.3 mmHg MV Decel Time: 239 msec    TR Vmax:        297.00 cm/s MV E velocity: 89.50 cm/s MV A velocity: 50.10 cm/s  SHUNTS MV E/A ratio:  1.79        Systemic VTI:  0.20 m Systemic Diam: 2.20 cm  Dalton McleanMD Electronically signed by Ezra Kanner Signature Date/Time: 04/06/2022/5:03:37 PM    Final          ______________________________________________________________________________________________       Risk Assessment/Calculations           Physical Exam VS:  There were no vitals taken for this visit.       Wt Readings from Last 3 Encounters:  01/22/24 185 lb (83.9 kg)  11/28/23 185 lb (83.9 kg)  09/15/23 185 lb (83.9 kg)    GEN: Well nourished, well developed in no acute distress. Sitting upright on the exam table  NECK: No JVD; No carotid bruits CARDIAC: RRR, no murmurs, rubs, gallops. Radial pulses 2+ bilaterally  RESPIRATORY:  Clear to auscultation without rales, wheezing or rhonchi. Normal WOB on room air   ABDOMEN: Soft, non-tender, non-distended EXTREMITIES: 1+ edema in BLE; No deformity   ASSESSMENT AND PLAN  Bradycardia  - Patient had a colonoscopy yesterday -was told that prior to the procedure, his HR got very low (down to 40 BPM). HR increased during the procedure  - Today, HR 69 BPM. EKG shows sinus bradycardia with HR 57 BPM  - Patient denies dizziness, syncope, near syncope.  - Patient not on any AV  nodal medications. BP stable  - Ordered 7 day zio to better assess HR   Lower extremity swelling  - Patient reports that he has been having lower extremity swelling since he was admitted with sepsis in 04/2022. Swelling worse if he sits for long  periods of time. Improves if he walks more, elevates his feet - Echo in 04/2022 showed EF 55-60%, no regional wall motion abnormalities, normal LV diastolic parameters, normal RV systolic function, no significant valvular abnormalities  - Suspect venous insufficiency  - Encouraged patient to walk more and elevate his feet when at rest. Also encouraged compression stockings, low salt diet  - Offered diuretic, but patient not interested at this time due to side effect of increased urination   Parkinsons  -Followed by neurology    Dispo: Follow up in 8 weeks with APP   Signed, Rollo FABIENE Louder, PA-C

## 2024-01-23 NOTE — Progress Notes (Unsigned)
Enrolled patient for a 7 day Zio XT monitor to be mailed to patients home   DOD to read 

## 2024-01-23 NOTE — Patient Instructions (Signed)
 Medication Instructions:  Your physician recommends that you continue on your current medications as directed. Please refer to the Current Medication list given to you today. *If you need a refill on your cardiac medications before your next appointment, please call your pharmacy*  Lab Work: None ordered If you have labs (blood work) drawn today and your tests are completely normal, you will receive your results only by: MyChart Message (if you have MyChart) OR A paper copy in the mail If you have any lab test that is abnormal or we need to change your treatment, we will call you to review the results.  Testing/Procedures: GEOFFRY HEWS- Long Term Monitor Instructions  Your physician has requested you wear a ZIO patch monitor for 7 days.  This is a single patch monitor. [employer] supplies one patch monitor per enrollment. Additional stickers are not available. Please do not apply patch if you will be having a Nuclear Stress Test,  Echocardiogram, Cardiac CT, MRI, or Chest Xray during the period you would be wearing the  monitor. The patch cannot be worn during these tests. You cannot remove and re-apply the  ZIO XT patch monitor.  Your ZIO patch monitor will be mailed 3 day USPS to your address on file. It may take 3-5 days  to receive your monitor after you have been enrolled.  Once you have received your monitor, please review the enclosed instructions. Your monitor  has already been registered assigning a specific monitor serial # to you.  Billing and Patient Assistance Program Information  We have supplied [employer] with any of your insurance information on file for billing purposes. [employer] offers a sliding scale Patient Assistance Program for patients that do not have  insurance, or whose insurance does not completely cover the cost of the ZIO monitor.  You must apply for the Patient Assistance Program to qualify for this discounted rate.  To apply, please call [employer] at [phone],  select option 4, select option 2, ask to apply for  Patient Assistance Program. Meredeth will ask your household income, and how many people  are in your household. They will quote your out-of-pocket cost based on that information.  [employer] will also be able to set up a 62-month, interest-free payment plan if needed.  Applying the monitor   Shave hair from upper left chest.  Hold abrader disc by orange tab. Rub abrader in 40 strokes over the upper left chest as  indicated in your monitor instructions.  Clean area with 4 enclosed alcohol pads. Let dry.  Apply patch as indicated in monitor instructions. Patch will be placed under collarbone on left  side of chest with arrow pointing upward.  Rub patch adhesive wings for 2 minutes. Remove white label marked 1. Remove the white  label marked 2. Rub patch adhesive wings for 2 additional minutes.  While looking in a mirror, press and release button in center of patch. A small green light will  flash 3-4 times. This will be your only indicator that the monitor has been turned on.  Do not shower for the first 24 hours. You may shower after the first 24 hours.  Press the button if you feel a symptom. You will hear a small click. Record Date, Time and  Symptom in the Patient Logbook.  When you are ready to remove the patch, follow instructions on the last 2 pages of Patient  Logbook. Stick patch monitor onto the last page of Patient Logbook.  Place Patient Logbook in the blue  and white box. Use locking tab on box and tape box closed  securely. The blue and white box has prepaid postage on it. Please place it in the mailbox as  soon as possible. Your physician should have your test results approximately 7 days after the  monitor has been mailed back to [facility].  Call [facility] Customer Care at [phone] if you have questions regarding  your ZIO XT patch monitor. Call them immediately if you see an orange light blinking on your   monitor.  If your monitor falls off in less than 4 days, contact our Monitor department at [phone].  If your monitor becomes loose or falls off after 4 days call [employer] at [phone] for  suggestions on securing your monitor   Follow-Up: At [facility], you and your health needs are our priority.  As part of our continuing mission to provide you with exceptional heart care, our providers are all part of one team.  This team includes your primary Cardiologist (physician) and Advanced Practice Providers or APPs (Physician Assistants and Nurse Practitioners) who all work together to provide you with the care you need, when you need it.  Your next appointment:   8 week(s)  Provider:   Rollo Louder, PA or ANY APP   We recommend signing up for the patient portal called MyChart.  Sign up information is provided on this After Visit Summary.  MyChart is used to connect with patients for Virtual Visits (Telemedicine).  Patients are able to view lab/test results, encounter notes, upcoming appointments, etc.  Non-urgent messages can be sent to your provider as well.   To learn more about what you can do with MyChart, go to [email or web address].   Other Instructions

## 2024-01-23 NOTE — Telephone Encounter (Signed)
  Follow up Call-     01/22/2024    9:32 AM  Call back number  Post procedure Call Back phone  # [phone]  Permission to leave phone message Yes     Patient questions:  Do you have a fever, pain , or abdominal swelling? No. Pain Score  0 *  Have you tolerated food without any problems? Yes.    Have you been able to return to your normal activities? Yes.    Do you have any questions about your discharge instructions: Diet   No. Medications  No. Follow up visit  No.  Do you have questions or concerns about your Care? No.  Actions: * If pain score is 4 or above: No action needed, pain <4.

## 2024-01-23 NOTE — Telephone Encounter (Signed)
 Patient being seen today with Cardiology.

## 2024-02-08 ENCOUNTER — Ambulatory Visit: Payer: Self-pay

## 2024-02-08 NOTE — Telephone Encounter (Signed)
 FYI Only or Action Required?: Action required by provider: medication refill request. Requesting paxlovid.  Patient was last seen in primary care on 01/05/2024 by Geofm Glade PARAS, MD.  Called Nurse Triage reporting Covid Exposure.  Symptoms began yesterday.  Interventions attempted: Rest, hydration, or home remedies.  Symptoms are: unchanged.  Triage Disposition: Call PCP Within 24 Hours  Patient/caregiver understands and will follow disposition?: Yes, will follow disposition  Copied from CRM #[id number]. Topic: Clinical - Red Word Triage >> Feb 08, 2024  4:23 PM Drema MATSU wrote: Red Word that prompted transfer to Nurse Triage: Patient care giver tested positive for COVID. Patient is requesting Paxlovid  Symptoms: headache, very fatigue Reason for Disposition  [1] Patient is NOT HIGH RISK AND [2] strongly requests antiviral medicine AND [3] COVID-19 symptoms present < 5 days  Answer Assessment - Initial Assessment Questions 1. COVID-19 EXPOSURE: Please describe how you were exposed to someone with a COVID-19 infection.     Caregiver that comes to home 2. PLACE of CONTACT: Where were you when you were exposed to COVID-19? (e.g., home, school, medical waiting room; which city?)     home 3. TYPE of CONTACT: How much contact was there? (e.g., sitting next to, live in same house, work in same office, same building)     In same building, pt caregiver is positive for covid and is in pt home helping with ADLs for about 2 hours/day 4. DURATION of CONTACT: How long were you in contact with the COVID-19 patient? (e.g., a few seconds, passed by person, a few minutes, 15 minutes or longer, live with the patient)     Caregiver-2 hours/day 5. DATE of CONTACT: When did you have contact with a COVID-19 patient? (e.g., hours, days ago)     2 days ago 6. MASK: Were you wearing a mask? Was the other person wearing a mask? Note: wearing a mask reduces the risk of an otherwise close contact.      denies 7. SYMPTOMS: Do you have any symptoms? (e.g., fever, cough, breathing difficulty, loss of taste or smell)     Headache, weakness,  8. COVID-19 VACCINE: Have you had the COVID-19 vaccine? If Yes, ask: When did you last get it?     Has not had covid vaccine this year  10. HIGH RISK: Do you have any heart or lung problems? (e.g., asthma, COPD, heart failure) Do you have a weak immune system or other risk factors? (e.g., HIV positive, chemotherapy, renal failure, diabetes mellitus, sickle cell anemia, obesity)       Denies  Pt refuses OV, only willing to do VV, advised to do at home covid test. Pt requesting antiviral.  Protocols used: COVID-19 - Exposure-A-AH, COVID-19 - Diagnosed or Suspected-A-AH

## 2024-02-09 ENCOUNTER — Telehealth: Admitting: Nurse Practitioner

## 2024-02-09 DIAGNOSIS — U071 COVID-19: Secondary | ICD-10-CM | POA: Insufficient documentation

## 2024-02-09 MED ORDER — NIRMATRELVIR/RITONAVIR (PAXLOVID)TABLET: 3.0000 | ORAL_TABLET | Freq: Two times a day (BID) | ORAL | 0 refills | Status: AC

## 2024-02-09 MED ORDER — PROMETHAZINE-DM 6.25-15 MG/5ML PO SYRP: 5.0000 mL | ORAL_SOLUTION | Freq: Four times a day (QID) | ORAL | 0 refills | Status: DC | PRN

## 2024-02-09 NOTE — Progress Notes (Signed)
 Established Patient Office Visit  An audio/visual tele-health visit was completed today for this patient. I connected with  Melvin [name] on 02/09/24 utilizing audio/visual technology and verified that I am speaking with the correct person using two identifiers. The patient was located at their home, and I was located at the office of [facility] at [facility] during the encounter. I discussed the limitations of evaluation and management by telemedicine. The patient expressed understanding and agreed to proceed.     Subjective   Patient ID: Melvin [name], male    DOB: 08-Mar-1946  Age: 78 y.o. MRN: [id number]  Chief Complaint  Patient presents with   Covid Exposure    Was expose to on Tuesday,  headache, fatigue     Discussed the use of AI scribe software for clinical note transcription with the patient, who gave verbal consent to proceed.  History of Present Illness Melvin [name] is a 78 year old male who presents with symptoms following COVID-19 exposure. He is accompanied by his spouse.  Upper respiratory symptoms - Congestion and stuffy nose present - Fatigue noted - Uncertain about presence of fever - No shortness of breath  Covid-19 exposure and history - Recent symptoms following COVID-19 exposure, exposed 3 days ago. Symptom onset yesterday.  - Similar symptoms occurred during COVID-19 infection in December 2023 - Vaccinated against COVID-19, but has not received the latest booster this fall       ROS: see HPI    Objective:     There were no vitals taken for this visit.   Physical Exam Comprehensive physical exam not completed today as office visit was conducted remotely.  No acute disress.  Patient was alert and oriented, and appeared to have appropriate judgment.   No results found for any visits on 02/09/24.    The 10-year ASCVD risk score (Arnett DK, et al., 2019) is: 21.6%    Assessment & Plan:   Problem [name] Items Addressed This Visit        Other   COVID - Primary   Acute COVID-19 infection with congestion and fatigue Acute COVID-19 infection with congestion and fatigue. No shortness of breath. Previous infection in September 2023. Vaccinated in the past but current booster not yet received. Higher risk for severe illness due to medical history. - Prescribed Paxlovid, avoid triamcinolone  cream during treatment. - Prescribed promethazine  dextromethorphan syrup for cough, caution sedation, avoid driving or operating heavy machinery. - Advised COVID-19 test tomorrow, consider Paxlovid even if negative due to high risk of severe illness and likely false negative.. - Instructed to seek emergency care for severe symptoms like shortness of breath, confusion, or fever not responding to Tylenol .      Relevant Medications   nirmatrelvir/ritonavir (PAXLOVID) 20 x 150 MG & 10 x 100MG  TABS   promethazine -dextromethorphan (PROMETHAZINE -DM) 6.25-15 MG/5ML syrup   Assessment and Plan Assessment & Plan Acute COVID-19 infection with congestion and fatigue Acute COVID-19 infection with congestion and fatigue. No shortness of breath. Previous infection in September 2023. Vaccinated in the past but current booster not yet received. Higher risk for severe illness due to medical history. - Prescribed Paxlovid, avoid triamcinolone  cream during treatment. - Prescribed promethazine  dextromethorphan syrup for cough, caution sedation, avoid driving or operating heavy machinery. - Advised COVID-19 test tomorrow, consider Paxlovid even if negative due to high risk of severe illness and likely false negative.. - Instructed to seek emergency care for severe symptoms like shortness of breath, confusion, or fever not responding to  Tylenol .   No follow-ups on file.    Lauraine FORBES Pereyra, NP

## 2024-02-09 NOTE — Assessment & Plan Note (Signed)
 Acute COVID-19 infection with congestion and fatigue Acute COVID-19 infection with congestion and fatigue. No shortness of breath. Previous infection in September 2023. Vaccinated in the past but current booster not yet received. Higher risk for severe illness due to medical history. - Prescribed Paxlovid, avoid triamcinolone  cream during treatment. - Prescribed promethazine  dextromethorphan syrup for cough, caution sedation, avoid driving or operating heavy machinery. - Advised COVID-19 test tomorrow, consider Paxlovid even if negative due to high risk of severe illness and likely false negative.. - Instructed to seek emergency care for severe symptoms like shortness of breath, confusion, or fever not responding to Tylenol .

## 2024-02-15 ENCOUNTER — Telehealth: Payer: Self-pay | Admitting: Internal Medicine

## 2024-02-15 DIAGNOSIS — R002 Palpitations: Secondary | ICD-10-CM | POA: Diagnosis not present

## 2024-02-15 NOTE — Telephone Encounter (Signed)
 Melvin [name] with [employer] calling with abnormal Zio patch results. Please advise.

## 2024-02-15 NOTE — Telephone Encounter (Signed)
 Monitor alert from [employer].      Cardiac Monitor Alert  Date of alert:  02/15/2024   Patient Name: Melvin [name]  DOB: 1945-06-01  MRN: [id number]   [facility] Cardiologist: Lurena MARLA Red, MD  [facility] EP:  None    Monitor Information: Long Term Monitor [ZioXT]  Reason:  Fatigue Ordering provider:  Rollo Louder, Pa-C   Alert Pause(s) - Longest:  3 seconds, one beat and then another 3 second pause. (Page 12, strip 4 of final report) This is the 1st alert for this rhythm.   Next Cardiology Appointment  Date:  03/19/24  Provider:  Rollo Louder, PA-C  The patient was contacted today.  He is asymptomatic. Arrhythmia, symptoms and history reviewed with Dr. Elmira.   Plan:  Per Dr. Elmira, Asymptomatic sinus pauses. Recommend follow up with ordering provider.     Geni LITTIE Sar, RN  02/15/2024 5:37 PM

## 2024-02-21 ENCOUNTER — Institutional Professional Consult (permissible substitution) (INDEPENDENT_AMBULATORY_CARE_PROVIDER_SITE_OTHER)

## 2024-02-22 DIAGNOSIS — H00011 Hordeolum externum right upper eyelid: Secondary | ICD-10-CM | POA: Diagnosis not present

## 2024-02-22 DIAGNOSIS — L821 Other seborrheic keratosis: Secondary | ICD-10-CM | POA: Diagnosis not present

## 2024-02-22 DIAGNOSIS — Z85828 Personal history of other malignant neoplasm of skin: Secondary | ICD-10-CM | POA: Diagnosis not present

## 2024-02-25 DIAGNOSIS — R002 Palpitations: Secondary | ICD-10-CM

## 2024-02-26 ENCOUNTER — Ambulatory Visit

## 2024-02-27 ENCOUNTER — Ambulatory Visit: Payer: Self-pay | Admitting: Cardiology

## 2024-03-04 NOTE — Progress Notes (Unsigned)
 Cardiology Office Note:   Date:  03/06/2024  ID:  [name], DOB [birth year], MRN [id number] PCP:  Geofm Glade PARAS, MD  [facility] Providers Cardiologist:  Wendel Haws, MD Referring MD: Geofm Glade PARAS, MD  Chief Complaint/Reason for Referral: Bradycardia ASSESSMENT:    1. Bradycardia   2. Hyperlipidemia LDL goal <70   3. Aortic atherosclerosis   4. Aortic dilatation   5. Parkinson's disease, unspecified whether dyskinesia present, unspecified whether manifestations fluctuate (HCC)     PLAN:   In order of problems listed above: Bradycardia: ZIO monitor demonstrated 47% bradycardia.  The patient's Cosopt eyedrops have a well-known association with bradycardia.  Will reach out to PCP about stopping timolol however he derives some benefit in regards to glaucoma treatment.  He does not seem symptomatic from his bradycardia.  I have instructed the patient to speak with PCP about perhaps adjusting this medicine or not. Hyperlipidemia: By virtue of the patient's aortic atherosclerosis the patient's LDL goal is less than 70.  Consider statin therapy Aortic atherosclerosis: Consider statin therapy  Aortic dilatation: Will obtain echocardiogram now to evaluate and if concerning will obtain CT scan. Parkinson's disease: Continue carbidopa /levodopa  25 x 100/2 a tablet 3 times daily.            Dispo:  Return in about 6 months (around 09/03/2024).       I spent 35 minutes reviewing all clinical data during and prior to this visit including all relevant imaging studies, laboratories, clinical information from other health systems and prior notes from both Cardiology and other specialties, interviewing the patient, conducting a complete physical examination, and coordinating care in order to formulate a comprehensive and personalized evaluation and treatment plan.   History of Present Illness:    FOCUSED PROBLEM LIST:   Parkinson's disease Hyperlipidemia Aortic atherosclerosis Chest  x-ray 2023 Aortic dilatation 40 mm, TTE 2023 BMI 25 March 2024:  Patient consents to use of AI scribe. The patient returns for routine follow-up.  He was seen cardiology in September due to an irregular heartbeat.  He was referred for a monitor which demonstrated no atrial fibrillation or high-grade blocks however multiple pauses predominantly during nocturnal hours were seen.  The patient is doing well.  He denies any lightheadedness, fatigue, presyncope, or syncope.  He has had no chest pain or shortness of breath.  He is otherwise well without significant complaints today.     Current Medications: Current Meds  Medication Sig   acetaminophen  (TYLENOL ) 500 MG tablet Take 500 mg by mouth every 6 (six) hours as needed for moderate pain.   b complex vitamins capsule Take 1 capsule by mouth daily.   carbidopa -levodopa  (SINEMET  IR) 25-100 MG tablet Take 1/2 tab BY MOUTH THREE TIMES DAILY (Patient taking differently: Take 2 tablets by mouth 3 (three) times daily.)   cetirizine  (ZYRTEC ) 10 MG tablet Take 1 tablet (10 mg total) by mouth daily.   cimetidine (TAGAMET) 200 MG tablet Take 200 mg by mouth 2 (two) times daily.   dorzolamidel-timolol (COSOPT PF) 22.3-6.8 MG/ML SOLN ophthalmic solution Place 1 drop into both eyes 2 (two) times daily.   EDEX 20 MCG injection 20 mcg by Intracavitary route as needed for erectile dysfunction.   fluocinonide  cream (LIDEX ) 0.05 % APPLY TO THE AFFECTED AREA ONCE A DAY.   ibuprofen  (ADVIL ) 200 MG tablet Take 200 mg by mouth every 8 (eight) hours as needed for moderate pain.   ketoconazole (NIZORAL) 2 % cream SMARTSIG:sparingly Topical Twice Daily  metroNIDAZOLE (METROCREAM) 0.75 % cream SMARTSIG:sparingly Topical Twice Daily   Multiple Vitamin (MULTIVITAMIN WITH MINERALS) TABS tablet Take 1 tablet by mouth daily with lunch.   phenylephrine  (NEO-SYNEPHRINE) 1 % nasal spray Place 1 drop into both nostrils as needed for congestion.   Tafluprost, PF, (ZIOPTAN)  0.0015 % SOLN Place 1 drop into both eyes at bedtime.   triamcinolone  cream (KENALOG ) 0.1 % SMARTSIG:sparingly Topical Twice Daily     Review of Systems:   Please see the history of present illness.    All other systems reviewed and are negative.     EKGs/Labs/Other Test Reviewed:   EKG: 2025: Sinus bradycardia  EKG Interpretation Date/Time:    Ventricular Rate:    PR Interval:    QRS Duration:    QT Interval:    QTC Calculation:   R Axis:      Text Interpretation:          CARDIAC STUDIES: Refer to CV Procedures and Imaging Tabs   Risk Assessment/Calculations:          Physical Exam:   VS:  BP 130/84 (BP Location: Left Arm, Patient Position: Sitting, Cuff Size: Normal)   Pulse (!) 59   Ht 6' 1 (1.854 m)   Wt 190 lb (86.2 kg)   SpO2 96%   BMI 25.07 kg/m        Wt Readings from Last 3 Encounters:  03/06/24 190 lb (86.2 kg)  01/23/24 188 lb 12.8 oz (85.6 kg)  01/22/24 185 lb (83.9 kg)      GENERAL:  No apparent distress, AOx3 HEENT:  No carotid bruits, +2 carotid impulses, no scleral icterus CAR: RRR  no murmurs, gallops, rubs, or thrills RES:  Clear to auscultation bilaterally ABD:  Soft, nontender, nondistended, positive bowel sounds x 4 VASC:  +2 radial pulses, +2 carotid pulses NEURO:  CN 2-12 grossly intact; motor and sensory grossly intact PSYCH:  No active depression or anxiety EXT:  No edema, ecchymosis, or cyanosis  Signed, Karole Oo K Nollan Muldrow, MD  03/06/2024 3:51 PM    [facility] [address] [place], [place], [place]  [address] Phone: [phone]; Fax: [phone]   Note:  This document was prepared using Dragon voice recognition software and may include unintentional dictation errors.

## 2024-03-06 ENCOUNTER — Encounter: Payer: Self-pay | Admitting: Internal Medicine

## 2024-03-06 ENCOUNTER — Ambulatory Visit: Admitting: Internal Medicine

## 2024-03-06 ENCOUNTER — Ambulatory Visit: Attending: Internal Medicine | Admitting: Internal Medicine

## 2024-03-06 VITALS — BP 130/84 | HR 59 | Ht 73.0 in | Wt 190.0 lb

## 2024-03-06 DIAGNOSIS — I77819 Aortic ectasia, unspecified site: Secondary | ICD-10-CM | POA: Diagnosis not present

## 2024-03-06 DIAGNOSIS — I700 Atherosclerosis of aorta: Secondary | ICD-10-CM

## 2024-03-06 DIAGNOSIS — E785 Hyperlipidemia, unspecified: Secondary | ICD-10-CM

## 2024-03-06 DIAGNOSIS — R001 Bradycardia, unspecified: Secondary | ICD-10-CM | POA: Diagnosis not present

## 2024-03-06 DIAGNOSIS — G20A1 Parkinson's disease without dyskinesia, without mention of fluctuations: Secondary | ICD-10-CM

## 2024-03-06 NOTE — Patient Instructions (Addendum)
 Medication Instructions:  No medication changes were made at this visit. Continue current regimen.   *If you need a refill on your cardiac medications before your next appointment, please call your pharmacy*  Lab Work: None ordered today. If you have labs (blood work) drawn today and your tests are completely normal, you will receive your results only by: MyChart Message (if you have MyChart) OR A paper copy in the mail If you have any lab test that is abnormal or we need to change your treatment, we will call you to review the results.  Testing/Procedures: Your physician has requested that you have an echocardiogram. Echocardiography is a painless test that uses sound waves to create images of your heart. It provides your doctor with information about the size and shape of your heart and how well your heart's chambers and valves are working. This procedure takes approximately one hour. There are no restrictions for this procedure. Please do NOT wear cologne, perfume, aftershave, or lotions (deodorant is allowed). Please arrive 15 minutes prior to your appointment time.  Please note: We ask at that you not bring children with you during ultrasound (echo/ vascular) testing. Due to room size and safety concerns, children are not allowed in the ultrasound rooms during exams. Our front office staff cannot provide observation of children in our lobby area while testing is being conducted. An adult accompanying a patient to their appointment will only be allowed in the ultrasound room at the discretion of the ultrasound technician under special circumstances. We apologize for any inconvenience.   Follow-Up: At [facility], you and your health needs are our priority.  As part of our continuing mission to provide you with exceptional heart care, our providers are all part of one team.  This team includes your primary Cardiologist (physician) and Advanced Practice Providers or APPs (Physician  Assistants and Nurse Practitioners) who all work together to provide you with the care you need, when you need it.  Your next appointment:   6 month(s)  Provider:   Rollo Louder, PA-C

## 2024-03-08 ENCOUNTER — Telehealth: Payer: Self-pay

## 2024-03-08 ENCOUNTER — Ambulatory Visit: Payer: Self-pay

## 2024-03-08 NOTE — Telephone Encounter (Signed)
 Message left on wife's voicemail today.

## 2024-03-08 NOTE — Telephone Encounter (Signed)
 FYI Only or Action Required?: Action required by provider: clinical question for provider and update on patient condition.  Patient was last seen in primary care on 02/09/2024 by Melvin Lauraine BRAVO, NP.  Called Nurse Triage reporting Bradycardia and Medication Problem.  Symptoms began yesterday.  Interventions attempted: Nothing.  Symptoms are: stable.  Triage Disposition: Call PCP When Office is Open  Patient/caregiver understands and will follow disposition?: Yes     Copied from CRM [id number]. Topic: Clinical - Red Word Triage >> Mar 08, 2024  3:06 PM Melvin [name] wrote: Red Word that prompted transfer to Nurse Triage: eye drops medication is causing his heart rate to drop.      Reason for Disposition  [1] Caller has NON-URGENT medicine question about med that PCP prescribed AND [2] triager unable to answer question  Answer Assessment - Initial Assessment Questions Patient was seen by his cardiologist yesterday and his heart rate 59 bpm. His cardiologist stated it might be due to his glaucoma medications and wanted him to inform Dr. Geofm to try and get the medication changed due to it lowering his heart rate. He denies any symptoms with the slower heart rate.      1. NAME of MEDICINE: What medicine(s) are you calling about?     dorzolamidel-timolol (COSOPT PF) 22.3-6.8 MG/ML SOLN ophthalmic solution [[id number]] 2. QUESTION: What is your question? (e.g., double dose of medicine, side effect)     Patient is experiencing side effects 4. SYMPTOMS: Do you have any symptoms? If Yes, ask: What symptoms are you having?  How bad are the symptoms (e.g., mild, moderate, severe)     Slower heart reate  Protocols used: Medication Question Call-A-AH

## 2024-03-08 NOTE — Telephone Encounter (Signed)
 Copied from CRM [id number]. Topic: Clinical - Medication Question >> Mar 08, 2024  3:03 PM [name] wrote: Reason for CRM: Patient called to discuss getting another alternative to his dorzolamidel-timolol (COSOPT PF) 22.3-6.8 MG/ML SOLN ophthalmic solution. This prescription is effecting his heart rate. 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 Please call back the patient to discuss.

## 2024-03-08 NOTE — Telephone Encounter (Signed)
 This needs to be done with his ophthalmologist.

## 2024-03-11 NOTE — Telephone Encounter (Signed)
 Message was left for his wife on Friday that he needs to see his ophthalmologist as we are not managing his drops for his glaucoma.

## 2024-03-13 ENCOUNTER — Institutional Professional Consult (permissible substitution) (INDEPENDENT_AMBULATORY_CARE_PROVIDER_SITE_OTHER)

## 2024-03-15 ENCOUNTER — Ambulatory Visit: Admitting: Cardiology

## 2024-03-19 ENCOUNTER — Ambulatory Visit: Admitting: Cardiology

## 2024-03-19 ENCOUNTER — Other Ambulatory Visit: Payer: Self-pay | Admitting: Internal Medicine

## 2024-03-19 DIAGNOSIS — L309 Dermatitis, unspecified: Secondary | ICD-10-CM

## 2024-03-27 ENCOUNTER — Other Ambulatory Visit

## 2024-03-27 DIAGNOSIS — I6381 Other cerebral infarction due to occlusion or stenosis of small artery: Secondary | ICD-10-CM

## 2024-03-27 DIAGNOSIS — R739 Hyperglycemia, unspecified: Secondary | ICD-10-CM | POA: Diagnosis not present

## 2024-03-27 DIAGNOSIS — R030 Elevated blood-pressure reading, without diagnosis of hypertension: Secondary | ICD-10-CM

## 2024-03-27 DIAGNOSIS — G20C Parkinsonism, unspecified: Secondary | ICD-10-CM | POA: Diagnosis not present

## 2024-03-27 DIAGNOSIS — Z8673 Personal history of transient ischemic attack (TIA), and cerebral infarction without residual deficits: Secondary | ICD-10-CM | POA: Diagnosis not present

## 2024-03-27 LAB — LIPID PANEL
Cholesterol: 151 mg/dL (ref 0–200)
HDL: 55.1 mg/dL (ref >39.00)
LDL Cholesterol: 86 mg/dL (ref 0–99)
NonHDL: 95.76
Total CHOL/HDL Ratio: 3
Triglycerides: 49 mg/dL (ref 0.0–149.0)
VLDL: 9.8 mg/dL (ref 0.0–40.0)

## 2024-03-27 LAB — CBC WITH DIFFERENTIAL/PLATELET
Basophils Absolute: 0 K/uL (ref 0.0–0.1)
Basophils Relative: 0.4 % (ref 0.0–3.0)
Eosinophils Absolute: 0.1 K/uL (ref 0.0–0.7)
Eosinophils Relative: 2.2 % (ref 0.0–5.0)
HCT: 40.4 % (ref 39.0–52.0)
Hemoglobin: 13.8 g/dL (ref 13.0–17.0)
Lymphocytes Relative: 22.8 % (ref 12.0–46.0)
Lymphs Abs: 1.5 K/uL (ref 0.7–4.0)
MCHC: 34.2 g/dL (ref 30.0–36.0)
MCV: 99 fl (ref 78.0–100.0)
Monocytes Absolute: 0.5 K/uL (ref 0.1–1.0)
Monocytes Relative: 7.5 % (ref 3.0–12.0)
Neutro Abs: 4.5 K/uL (ref 1.4–7.7)
Neutrophils Relative %: 67.1 % (ref 43.0–77.0)
Platelets: 204 K/uL (ref 150.0–400.0)
RBC: 4.08 Mil/uL — ABNORMAL LOW (ref 4.22–5.81)
RDW: 13.1 % (ref 11.5–15.5)
WBC: 6.8 K/uL (ref 4.0–10.5)

## 2024-03-27 LAB — COMPREHENSIVE METABOLIC PANEL WITH GFR
ALT: 4 U/L (ref 0–53)
AST: 23 U/L (ref 0–37)
Albumin: 4 g/dL (ref 3.5–5.2)
Alkaline Phosphatase: 57 U/L (ref 39–117)
BUN: 23 mg/dL (ref 6–23)
CO2: 28 meq/L (ref 19–32)
Calcium: 9 mg/dL (ref 8.4–10.5)
Chloride: 104 meq/L (ref 96–112)
Creatinine, Ser: 1 mg/dL (ref 0.40–1.50)
GFR: 71.7 mL/min (ref >60.00)
Glucose, Bld: 91 mg/dL (ref 70–99)
Potassium: 4.4 meq/L (ref 3.5–5.1)
Sodium: 138 meq/L (ref 135–145)
Total Bilirubin: 0.8 mg/dL (ref 0.2–1.2)
Total Protein: 6.4 g/dL (ref 6.0–8.3)

## 2024-03-27 LAB — HEMOGLOBIN A1C: Hgb A1c MFr Bld: 5.6 % (ref 4.6–6.5)

## 2024-03-31 ENCOUNTER — Encounter: Payer: Self-pay | Admitting: Internal Medicine

## 2024-03-31 NOTE — Patient Instructions (Addendum)
 Medications changes include :   None    A referral was ordered and someone will call you to schedule an appointment.     Return in about 1 year (around 04/02/2025) for Physical Exam.    Health Maintenance, Male Adopting a healthy lifestyle and getting preventive care are important in promoting health and wellness. Ask your health care provider about: The right schedule for you to have regular tests and exams. Things you can do on your own to prevent diseases and keep yourself healthy. What should I know about diet, weight, and exercise? Eat a healthy diet  Eat a diet that includes plenty of vegetables, fruits, low-fat dairy products, and lean protein. Do not eat a lot of foods that are high in solid fats, added sugars, or sodium. Maintain a healthy weight Body mass index (BMI) is a measurement that can be used to identify possible weight problems. It estimates body fat based on height and weight. Your health care provider can help determine your BMI and help you achieve or maintain a healthy weight. Get regular exercise Get regular exercise. This is one of the most important things you can do for your health. Most adults should: Exercise for at least 150 minutes each week. The exercise should increase your heart rate and make you sweat (moderate-intensity exercise). Do strengthening exercises at least twice a week. This is in addition to the moderate-intensity exercise. Spend less time sitting. Even light physical activity can be beneficial. Watch cholesterol and blood lipids Have your blood tested for lipids and cholesterol at 79 years of age, then have this test every 5 years. You may need to have your cholesterol levels checked more often if: Your lipid or cholesterol levels are high. You are older than 79 years of age. You are at high risk for heart disease. What should I know about cancer screening? Many types of cancers can be detected early and may often be  prevented. Depending on your health history and family history, you may need to have cancer screening at various ages. This may include screening for: Colorectal cancer. Prostate cancer. Skin cancer. Lung cancer. What should I know about heart disease, diabetes, and high blood pressure? Blood pressure and heart disease High blood pressure causes heart disease and increases the risk of stroke. This is more likely to develop in people who have high blood pressure readings or are overweight. Talk with your health care provider about your target blood pressure readings. Have your blood pressure checked: Every 3-5 years if you are 19-25 years of age. Every year if you are 39 years old or older. If you are between the ages of 32 and 6 and are a current or former smoker, ask your health care provider if you should have a one-time screening for abdominal aortic aneurysm (AAA). Diabetes Have regular diabetes screenings. This checks your fasting blood sugar level. Have the screening done: Once every three years after age 53 if you are at a normal weight and have a low risk for diabetes. More often and at a younger age if you are overweight or have a high risk for diabetes. What should I know about preventing infection? Hepatitis B If you have a higher risk for hepatitis B, you should be screened for this virus. Talk with your health care provider to find out if you are at risk for hepatitis B infection. Hepatitis C Blood testing is recommended for: Everyone born from [birth year] through 1966. Anyone  with known risk factors for hepatitis C. Sexually transmitted infections (STIs) You should be screened each year for STIs, including gonorrhea and chlamydia, if: You are sexually active and are younger than 79 years of age. You are older than 79 years of age and your health care provider tells you that you are at risk for this type of infection. Your sexual activity has changed since you were last screened,  and you are at increased risk for chlamydia or gonorrhea. Ask your health care provider if you are at risk. Ask your health care provider about whether you are at high risk for HIV. Your health care provider may recommend a prescription medicine to help prevent HIV infection. If you choose to take medicine to prevent HIV, you should first get tested for HIV. You should then be tested every 3 months for as long as you are taking the medicine. Follow these instructions at home: Alcohol use Do not drink alcohol if your health care provider tells you not to drink. If you drink alcohol: Limit how much you have to 0-2 drinks a day. Know how much alcohol is in your drink. In the [place] one drink equals one 12 oz bottle of beer (355 mL), one 5 oz glass of wine (148 mL), or one 1 oz glass of hard liquor (44 mL). Lifestyle Do not use any products that contain nicotine or tobacco. These products include cigarettes, chewing tobacco, and vaping devices, such as e-cigarettes. If you need help quitting, ask your health care provider. Do not use street drugs. Do not share needles. Ask your health care provider for help if you need support or information about quitting drugs. General instructions Schedule regular health, dental, and eye exams. Stay current with your vaccines. Tell your health care provider if: You often feel depressed. You have ever been abused or do not feel safe at home. Summary Adopting a healthy lifestyle and getting preventive care are important in promoting health and wellness. Follow your health care provider's instructions about healthy diet, exercising, and getting tested or screened for diseases. Follow your health care provider's instructions on monitoring your cholesterol and blood pressure. This information is not intended to replace advice given to you by your health care provider. Make sure you discuss any questions you have with your health care provider. Document Revised:  09/07/2020 Document Reviewed: 09/07/2020 Elsevier Patient Education  2024 [employer].

## 2024-03-31 NOTE — Progress Notes (Unsigned)
 Subjective:    Patient ID: Melvin [name], male    DOB: [birth year], 78 y.o.   MRN: [id number]     HPI Melvin [name] here for a physical exam and his chronic medical problems.  Doing well.  No concerns.      Medications and allergies reviewed with patient and updated if appropriate.  Current Outpatient Medications on File Prior to Visit  Medication Sig Dispense Refill   acetaminophen  (TYLENOL ) 500 MG tablet Take 500 mg by mouth every 6 (six) hours as needed for moderate pain.     b complex vitamins capsule Take 1 capsule by mouth daily.     carbidopa -levodopa  (SINEMET  IR) 25-100 MG tablet Take 1/2 tab BY MOUTH THREE TIMES DAILY (Patient taking differently: Take 2 tablets by mouth 3 (three) times daily.)     cetirizine  (ZYRTEC ) 10 MG tablet Take 1 tablet (10 mg total) by mouth daily. 30 tablet 0   cimetidine (TAGAMET) 200 MG tablet Take 200 mg by mouth 2 (two) times daily.     dorzolamidel-timolol (COSOPT PF) 22.3-6.8 MG/ML SOLN ophthalmic solution Place 1 drop into both eyes 2 (two) times daily.     EDEX 20 MCG injection 20 mcg by Intracavitary route as needed for erectile dysfunction.     fluocinonide  cream (LIDEX ) 0.05 % APPLY TO THE AFFECTED AREA ONCE A DAY. 30 g 2   ibuprofen  (ADVIL ) 200 MG tablet Take 200 mg by mouth every 8 (eight) hours as needed for moderate pain.     ketoconazole (NIZORAL) 2 % cream SMARTSIG:sparingly Topical Twice Daily     metroNIDAZOLE (METROCREAM) 0.75 % cream SMARTSIG:sparingly Topical Twice Daily     Multiple Vitamin (MULTIVITAMIN WITH MINERALS) TABS tablet Take 1 tablet by mouth daily with lunch.     phenylephrine  (NEO-SYNEPHRINE) 1 % nasal spray Place 1 drop into both nostrils as needed for congestion.     Tafluprost, PF, (ZIOPTAN) 0.0015 % SOLN Place 1 drop into both eyes at bedtime.     triamcinolone  cream (KENALOG ) 0.1 % SMARTSIG:sparingly Topical Twice Daily     No current facility-administered medications on file prior to visit.    Review of  Systems  Constitutional:  Negative for fever.  HENT:  Negative for trouble swallowing.   Eyes:  Negative for visual disturbance.  Respiratory:  Negative for cough, shortness of breath and wheezing.   Cardiovascular:  Positive for leg swelling. Negative for chest pain and palpitations.  Gastrointestinal:  Positive for constipation. Negative for abdominal pain, blood in stool and diarrhea.       No gerd- controlled  Genitourinary:  Negative for difficulty urinating and dysuria.  Musculoskeletal:  Positive for back pain (stiff). Negative for arthralgias.  Skin:  Negative for rash.  Neurological:  Negative for light-headedness and headaches.  Psychiatric/Behavioral:  Negative for dysphoric mood. The patient [name] not nervous/anxious.        Objective:   Vitals:   04/02/24 0933  BP: 130/80  Pulse: (!) 102  Temp: 98.3 F (36.8 C)  SpO2: 95%   Filed Weights   04/02/24 0933  Weight: 174 lb (78.9 kg)   Body mass index [name] 22.96 kg/m.  BP Readings from Last 3 Encounters:  04/02/24 130/80  03/06/24 130/84  01/23/24 108/64    Wt Readings from Last 3 Encounters:  04/02/24 174 lb (78.9 kg)  03/06/24 190 lb (86.2 kg)  01/23/24 188 lb 12.8 oz (85.6 kg)      Physical Exam Constitutional: He appears well-developed and well-nourished.  No distress.  HENT:  Head: Normocephalic and atraumatic.  Right Ear: External ear normal.  Left Ear: External ear normal.  Normal ear canals and TM b/l  Mouth/Throat: Oropharynx [name] clear and moist. Eyes: Conjunctivae and EOM are normal.  Neck: Neck supple. No tracheal deviation present. No thyromegaly present.  No carotid bruit  Cardiovascular: Normal rate, regular rhythm, normal heart sounds and intact distal pulses.   No murmur heard.  No lower extremity edema. Pulmonary/Chest: Effort normal and breath sounds normal. No respiratory distress. He has no wheezes. He has no rales.  Abdominal: Soft. He exhibits no distension. There [name] no tenderness.   Genitourinary: deferred  Lymphadenopathy:   He has no cervical adenopathy.  Skin: Skin [name] warm and dry. He [name] not diaphoretic.  Psychiatric: He has a normal mood and affect. His behavior [name] normal.         Assessment & Plan:   Physical exam: Screening blood work  ordered Exercise   inconsistent Weight  [name] normal Substance abuse   none   Reviewed recommended immunizations.   Health Maintenance  Topic Date Due   Medicare Annual Wellness (AWV)  02/24/2024   COVID-19 Vaccine (4 - 2025-26 season) 04/18/2024 (Originally 01/01/2024)   DTaP/Tdap/Td (4 - Td or Tdap) 02/24/2032   Pneumococcal Vaccine: 50+ Years  Completed   Influenza Vaccine  Completed   Hepatitis C Screening  Completed   Meningococcal B Vaccine  Aged Out   Colonoscopy  Discontinued   Zoster Vaccines- Shingrix  Discontinued     See Problem List for Assessment and Plan of chronic medical problems.

## 2024-04-02 ENCOUNTER — Ambulatory Visit: Admitting: Internal Medicine

## 2024-04-02 VITALS — BP 130/80 | HR 102 | Temp 98.3°F | Ht 73.0 in | Wt 174.0 lb

## 2024-04-02 DIAGNOSIS — L700 Acne vulgaris: Secondary | ICD-10-CM | POA: Diagnosis not present

## 2024-04-02 DIAGNOSIS — Z8673 Personal history of transient ischemic attack (TIA), and cerebral infarction without residual deficits: Secondary | ICD-10-CM | POA: Diagnosis not present

## 2024-04-02 DIAGNOSIS — G20C Parkinsonism, unspecified: Secondary | ICD-10-CM

## 2024-04-02 DIAGNOSIS — C61 Malignant neoplasm of prostate: Secondary | ICD-10-CM | POA: Diagnosis not present

## 2024-04-02 DIAGNOSIS — R9721 Rising PSA following treatment for malignant neoplasm of prostate: Secondary | ICD-10-CM

## 2024-04-02 DIAGNOSIS — K219 Gastro-esophageal reflux disease without esophagitis: Secondary | ICD-10-CM | POA: Diagnosis not present

## 2024-04-02 DIAGNOSIS — L709 Acne, unspecified: Secondary | ICD-10-CM | POA: Insufficient documentation

## 2024-04-02 DIAGNOSIS — R739 Hyperglycemia, unspecified: Secondary | ICD-10-CM

## 2024-04-02 DIAGNOSIS — R7303 Prediabetes: Secondary | ICD-10-CM

## 2024-04-02 DIAGNOSIS — Z0000 Encounter for general adult medical examination without abnormal findings: Secondary | ICD-10-CM

## 2024-04-02 MED ORDER — TRETINOIN 0.025 % EX CREA: TOPICAL_CREAM | Freq: Every day | CUTANEOUS | 0 refills | Status: AC

## 2024-04-02 NOTE — Assessment & Plan Note (Addendum)
 Chronic Sugars improved-have been in normal range over the last 3 months Lab Results  Component Value Date   HGBA1C 5.6 03/27/2024   Low sugar / carb diet Stressed regular exercise-he will try to increase his exercise

## 2024-04-02 NOTE — Assessment & Plan Note (Addendum)
 Chronic Following with neurology at [facility]. Tonuzi Taking Sinemet  25-100 mg 2 tabs 3 times daily Stressed regular exercise

## 2024-04-02 NOTE — Assessment & Plan Note (Signed)
Chronic GERD controlled Continue Tagamet over-the-counter

## 2024-04-02 NOTE — Assessment & Plan Note (Signed)
 Having increased acne Would like to tretinoin cream - will prescribe 0.025 mg - apply at night

## 2024-04-02 NOTE — Assessment & Plan Note (Addendum)
 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

## 2024-04-02 NOTE — Assessment & Plan Note (Signed)
 Chronic Following with urology S/p prostatectomy 2014 PSA rising slowly-monitoring for now

## 2024-04-09 ENCOUNTER — Encounter (HOSPITAL_COMMUNITY): Payer: Self-pay | Admitting: General Surgery

## 2024-04-12 ENCOUNTER — Ambulatory Visit

## 2024-04-16 ENCOUNTER — Other Ambulatory Visit

## 2024-04-17 ENCOUNTER — Ambulatory Visit (HOSPITAL_COMMUNITY): Admission: RE | Admit: 2024-04-17 | Discharge: 2024-04-17 | Attending: Cardiology | Admitting: Cardiology

## 2024-04-17 DIAGNOSIS — Z859 Personal history of malignant neoplasm, unspecified: Secondary | ICD-10-CM | POA: Insufficient documentation

## 2024-04-17 DIAGNOSIS — I349 Nonrheumatic mitral valve disorder, unspecified: Secondary | ICD-10-CM | POA: Diagnosis not present

## 2024-04-17 DIAGNOSIS — I340 Nonrheumatic mitral (valve) insufficiency: Secondary | ICD-10-CM | POA: Insufficient documentation

## 2024-04-17 DIAGNOSIS — I77819 Aortic ectasia, unspecified site: Secondary | ICD-10-CM | POA: Diagnosis present

## 2024-04-17 LAB — ECHOCARDIOGRAM COMPLETE
Area-P 1/2: 3.1 cm2
Calc EF: 62.8 %
S' Lateral: 3.2 cm
Single Plane A2C EF: 66.2 %
Single Plane A4C EF: 61.6 %

## 2024-05-07 NOTE — Progress Notes (Unsigned)
 " [name] [address] [place] [address] Phone: [phone] Subjective:   Melvin [name], am serving as a scribe for Dr. Arthea Claudene.  I'm seeing this patient by the request  of:  Geofm Glade PARAS, MD  CC: Arm pain  YEP:Dlagzrupcz  03/31/2022 Patient does have the unsteady gait as well as poor balance at the moment.  Patient has noticed this since his fall but I do think it was likely contributed to the fall.  Patient does have a worsening resting tremor of the left upper extremity.  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  We discussed with patient that there are different treatment options but he is seeing neurology in the next week.  We discussed the potential for different test such as an MRI of the brain to make sure there is nothing there or an EMG.  We did discuss even the possibility of a motor dysfunction such as Parkinson's being a potential cause.  Patient is with his wife and all questions were answered and we discussed and patient will follow-up with neurology to discuss further.     Worsening cellulitis again.  Will put patient back on doxycycline .  Warned of potential side effects.  Will do it another 2 weeks to see if this will improve.  Follow-up again in 2 weeks for further evaluation.     Updated 05/08/2024 Melvin [name] is a 79 y.o. male coming in with complaint of B bicep pain when he is trying to sleep at night for past 2 weeks. Pain can be intense. Pain throughout the day is minimal. Does have pain during the day when activating the bicep. Does use a walker and a cane.    Past medical history significant for Parkinson's.   Reviewing patient's chart labs in November were unremarkable.  Reviewed patient's MRI of the cervical spine in December 2023 showing the patient does have chronic degenerative changes with significant foraminal  narrowing mostly at C5-6 and C6-7. Past Medical History:  Diagnosis Date   Arthritis    Cancer (HCC)    Cataract    Constipation    GERD (gastroesophageal reflux disease)    Glaucoma    Parkinson's disease (HCC)    not actually but per pt tendencies   Past Surgical History:  Procedure Laterality Date   COLONOSCOPY     INGUINAL HERNIA REPAIR Left 07/09/2021   Procedure: OPEN LEFT INGUINAL HERNIA REPAIR WITH MESH;  Surgeon: Rubin Calamity, MD;  Location: [facility] OR;  Service: General;  Laterality: Left;   INGUINAL HERNIA REPAIR Right 04/01/2021   Procedure: LAPAROSCOPIC INGUINAL HERNIA;  Surgeon: Rubin Calamity, MD;  Location: [facility] OR;  Service: General;  Laterality: Right;   INSERTION OF MESH Left 07/09/2021   Procedure: INSERTION OF MESH;  Surgeon: Rubin Calamity, MD;  Location: [facility] OR;  Service: General;  Laterality: Left;   INSERTION OF MESH Right 04/01/2021   Procedure: INSERTION OF MESH;  Surgeon: Rubin Calamity, MD;  Location: [facility] OR;  Service: General;  Laterality: Right;   LYMPHADENECTOMY Bilateral 09/12/2012   Procedure: LYMPHADENECTOMY;  Surgeon: Ricardo Likens, MD;  Location: [facility] ORS;  Service: Urology;  Laterality: Bilateral;   ROBOT ASSISTED LAPAROSCOPIC RADICAL PROSTATECTOMY N/A 09/12/2012   Procedure: ROBOTIC ASSISTED LAPAROSCOPIC RADICAL PROSTATECTOMY;  Surgeon: Ricardo Likens, MD;  Location: [facility] ORS;  Service: Urology;  Laterality: N/A;   TONSILLECTOMY     Social History  Socioeconomic History   Marital status: Married    Spouse name: Not on file   Number of children: Not on file   Years of education: Not on file   Highest education level: Not on file  Occupational History   Not on file  Tobacco Use   Smoking status: Former    Current packs/day: 0.00    Types: Cigarettes    Quit date: 09/12/1992    Years since quitting: 31.6   Smokeless tobacco: Never  Vaping Use   Vaping status: Never Used  Substance and Sexual Activity   Alcohol use: Not Currently   Drug  use: Never   Sexual activity: Not on file  Other Topics Concern   Not on file  Social History Narrative   Are you right handed or left handed? Right   Are you currently employed ? [employer]    What is your current occupation?   Do you live at home alone? wife   Who lives with you?    What type of home do you live in: 1 story or 2 story? Two but don't go up       Social Drivers of Health   Tobacco Use: Medium Risk (03/31/2024)   Patient History    Smoking Tobacco Use: Former    Smokeless Tobacco Use: Never    Passive Exposure: Not on Actuary Strain: Not on file  Food Insecurity: Low Risk (11/09/2022)   Received from [facility]   Epic    Within the past 12 months, you worried that your food would run out before you got money to buy more: Never true    Within the past 12 months, the food you bought just didn't last and you didn't have money to get more. : Never true  Transportation Needs: No Transportation Needs (11/09/2022)   Received from [facility]    In the past 12 months, has lack of reliable transportation kept you from medical appointments, meetings, work or from getting things needed for daily living? : No  Physical Activity: Not on file  Stress: Not on file  Social Connections: Not on file  Depression (PHQ2-9): Low Risk (04/02/2024)   Depression (PHQ2-9)    PHQ-2 Score: 1  Alcohol Screen: Not on file  Housing: Low Risk (11/09/2022)   Received from [facility]   Epic    What is your living situation today?: I have a steady place to live    Think about the place you live. Do you have problems with any of the following? Choose all that apply:: None/None on this list  Utilities: Low Risk (11/09/2022)   Received from [facility]   Utilities    In the past 12 months has the electric, gas, oil, or water company threatened to shut off services in your home? : No  Health Literacy: Not on file   Allergies[1] Family History   Problem Relation Age of Onset   Colon cancer Neg Hx    Colon polyps Neg Hx    Esophageal cancer Neg Hx    Rectal cancer Neg Hx    Stomach cancer Neg Hx     Current Outpatient Medications (Cardiovascular):    EDEX 20 MCG injection, 20 mcg by Intracavitary route as needed for erectile dysfunction.  Current Outpatient Medications (Respiratory):    cetirizine  (ZYRTEC ) 10 MG tablet, Take 1 tablet (10 mg total) by mouth daily.   phenylephrine  (NEO-SYNEPHRINE) 1 % nasal spray, Place 1 drop  into both nostrils as needed for congestion.  Current Outpatient Medications (Analgesics):    acetaminophen  (TYLENOL ) 500 MG tablet, Take 500 mg by mouth every 6 (six) hours as needed for moderate pain.   ibuprofen  (ADVIL ) 200 MG tablet, Take 200 mg by mouth every 8 (eight) hours as needed for moderate pain.  Current Outpatient Medications (Other):    b complex vitamins capsule, Take 1 capsule by mouth daily.   carbidopa -levodopa  (SINEMET  IR) 25-100 MG tablet, Take 1/2 tab BY MOUTH THREE TIMES DAILY (Patient taking differently: Take 2 tablets by mouth 3 (three) times daily.)   cimetidine (TAGAMET) 200 MG tablet, Take 200 mg by mouth 2 (two) times daily.   dorzolamidel-timolol (COSOPT PF) 22.3-6.8 MG/ML SOLN ophthalmic solution, Place 1 drop into both eyes 2 (two) times daily.   fluocinonide  cream (LIDEX ) 0.05 %, APPLY TO THE AFFECTED AREA ONCE A DAY.   ketoconazole (NIZORAL) 2 % cream, SMARTSIG:sparingly Topical Twice Daily   metroNIDAZOLE (METROCREAM) 0.75 % cream, SMARTSIG:sparingly Topical Twice Daily   Multiple Vitamin (MULTIVITAMIN WITH MINERALS) TABS tablet, Take 1 tablet by mouth daily with lunch.   Tafluprost, PF, (ZIOPTAN) 0.0015 % SOLN, Place 1 drop into both eyes at bedtime.   tretinoin  (RETIN-A ) 0.025 % cream, Apply topically at bedtime. Dx L 70.9   triamcinolone  cream (KENALOG ) 0.1 %, SMARTSIG:sparingly Topical Twice Daily   Reviewed prior external information including notes and imaging  from  primary care provider As well as notes that were available from care everywhere and other healthcare systems.  Past medical history, social, surgical and family history all reviewed in electronic medical record.  No pertanent information unless stated regarding to the chief complaint.   Review of Systems:  No headache, visual changes, nausea, vomiting, diarrhea, constipation, dizziness, abdominal pain, skin rash, fevers, chills, night sweats, weight loss, swollen lymph nodes, body aches, joint swelling, chest pain, shortness of breath, mood changes. POSITIVE muscle aches  Objective  Blood pressure 122/84, pulse 74, height 6' 1 (1.854 m), weight 176 lb (79.8 kg), SpO2 98%.   General: No apparent distress alert and oriented x3 mood and affect normal, dressed appropriately.  HEENT: Pupils equal, extraocular movements intact  Respiratory: Patient's speak in full sentences and does not appear short of breath  Cardiovascular: No lower extremity edema, non tender, no erythema  Arm exam shows bilateral positive speeds and Jurgenson's.  Patient's rotator cuff strength though is intact.  Patient does have hypertonicity noted of the extremities but no resting tremor noted.  Deep tendon reflexes appear to be intact.  Significant arthritic changes of multiple joints noted.    Limited muscular skeletal ultrasound was performed and interpreted by CLAUDENE HUSSAR, M  Limited ultrasound shows some hypoechoic changes mostly of the bicep tendon.  Otherwise no significant abnormality noted of the acromioclavicular joint.  No abnormality noted of the glenohumeral joint and rotator cuff appears to be intact with some degenerative changes.   [id number]; 15 additional minutes spent for Therapeutic exercises as stated in above notes.  This included exercises focusing on stretching, strengthening, with significant focus on eccentric aspects.   Long term goals include an improvement in range of motion, strength,  endurance as well as avoiding reinjury. Patient's frequency would include in 1-2 times a day, 3-5 times a week for a duration of 6-12 weeks. Shoulder Exercises that included:  Basic scapular stabilization to include adduction and depression of scapula Scaption, focusing on proper movement and good control Internal and External rotation utilizing a theraband, with elbow  tucked at side entire time Rows with theraband   Proper technique shown and discussed handout in great detail with ATC.  All questions were discussed and answered.     Impression and Recommendations:      The above documentation has been reviewed and is accurate and complete Arthea CHRISTELLA Sharps, DO      [1]  Allergies Allergen Reactions   Sulfa Antibiotics Hives   "

## 2024-05-08 ENCOUNTER — Encounter: Payer: Self-pay | Admitting: Family Medicine

## 2024-05-08 ENCOUNTER — Ambulatory Visit: Admitting: Family Medicine

## 2024-05-08 VITALS — BP 122/84 | HR 74 | Ht 73.0 in | Wt 176.0 lb

## 2024-05-08 DIAGNOSIS — R2689 Other abnormalities of gait and mobility: Secondary | ICD-10-CM | POA: Diagnosis not present

## 2024-05-08 DIAGNOSIS — R5381 Other malaise: Secondary | ICD-10-CM

## 2024-05-08 DIAGNOSIS — G20C Parkinsonism, unspecified: Secondary | ICD-10-CM

## 2024-05-08 DIAGNOSIS — M7522 Bicipital tendinitis, left shoulder: Secondary | ICD-10-CM | POA: Diagnosis not present

## 2024-05-08 DIAGNOSIS — M7521 Bicipital tendinitis, right shoulder: Secondary | ICD-10-CM | POA: Diagnosis not present

## 2024-05-08 NOTE — Assessment & Plan Note (Signed)
 Proximal bicep tendinitis bilaterally right greater than left.  Ultrasound shows some hypoechoic changes but no acute tearing appreciated.  Discussed icing regimen and home exercises, discussed which activities to do and which ones to avoid.  Increase activity slowly.  Discussed icing regimen.  Follow-up again in 6 to 12 weeks patient will try arm compression, topical anti-inflammatories.  Patient can continue to lift but decrease rate in range of motion.

## 2024-05-08 NOTE — Patient Instructions (Addendum)
 Bicep tendonitis Consider a kindle Arm compression  Keep hands in peripheral vision Voltaren  and Ice See me in 2 months

## 2024-05-29 ENCOUNTER — Encounter: Payer: Self-pay | Admitting: Internal Medicine

## 2024-05-29 ENCOUNTER — Telehealth: Admitting: Internal Medicine

## 2024-05-29 DIAGNOSIS — J019 Acute sinusitis, unspecified: Secondary | ICD-10-CM | POA: Diagnosis not present

## 2024-05-29 MED ORDER — AMOXICILLIN-POT CLAVULANATE 875-125 MG PO TABS: 1.0000 | ORAL_TABLET | Freq: Two times a day (BID) | ORAL | 0 refills | Status: AC

## 2024-05-29 NOTE — Assessment & Plan Note (Signed)
 Acute Start Augmentin  875-125 mg BID x 7 day Continue Vaseline, start using saline nasal spray consistently and frequently otc allergy medications Rest, fluid Call if no improvement

## 2024-05-29 NOTE — Progress Notes (Signed)
 Virtual Visit via Video Note  I connected with Melvin [name] on 05/29/24 at  3:40 PM EST by a video enabled telemedicine application and verified that I am speaking with the correct person using two identifiers.   I discussed the limitations of evaluation and management by telemedicine and the availability of in person appointments. The patient expressed understanding and agreed to proceed.  Present for the visit:  Myself, Dr Glade Hope, Melvin [name].  The patient is currently at home and I am in the office.    No referring provider.    History of Present Illness: He is here for an acute visit for sinus symptoms.  His symptoms started over a couple of weeks ago  He is experiencing fatigue, body aches, slight headaches, sinus pressure, nasal congestion, nosebleeds.  He has had some subjective fevers, but no documented fevers, no cough, wheeze or shortness of breath.  He has a history of sinus infections and states this feels similar to a sinus infection and is not getting better.  He has been using Vaseline in the nose.  The bleeding has been mild.   Review of Systems  Constitutional:  Positive for malaise/fatigue. Negative for fever.  HENT:  Positive for congestion (some), nosebleeds and sinus pain (pressure). Negative for ear pain and sore throat.   Respiratory:  Negative for cough, shortness of breath and wheezing.   Musculoskeletal:  Positive for myalgias.  Neurological:  Positive for headaches (slight). Negative for dizziness.      Social History   Socioeconomic History   Marital status: Married    Spouse name: Not on file   Number of children: Not on file   Years of education: Not on file   Highest education level: Not on file  Occupational History   Not on file  Tobacco Use   Smoking status: Former    Current packs/day: 0.00    Types: Cigarettes    Quit date: 09/12/1992    Years since quitting: 31.7   Smokeless tobacco: Never  Vaping Use   Vaping status: Never Used   Substance and Sexual Activity   Alcohol use: Not Currently   Drug use: Never   Sexual activity: Not on file  Other Topics Concern   Not on file  Social History Narrative   Are you right handed or left handed? Right   Are you currently employed ? [employer]    What is your current occupation?   Do you live at home alone? wife   Who lives with you?    What type of home do you live in: 1 story or 2 story? Two but don't go up       Social Drivers of Health   Tobacco Use: Medium Risk (05/08/2024)   Patient History    Smoking Tobacco Use: Former    Smokeless Tobacco Use: Never    Passive Exposure: Not on Actuary Strain: Not on file  Food Insecurity: Low Risk (11/09/2022)   Received from [facility]   Epic    Within the past 12 months, you worried that your food would run out before you got money to buy more: Never true    Within the past 12 months, the food you bought just didn't last and you didn't have money to get more. : Never true  Transportation Needs: No Transportation Needs (11/09/2022)   Received from [facility]    In the past 12 months, has lack of reliable transportation  kept you from medical appointments, meetings, work or from getting things needed for daily living? : No  Physical Activity: Not on file  Stress: Not on file  Social Connections: Not on file  Depression (PHQ2-9): Low Risk (04/02/2024)   Depression (PHQ2-9)    PHQ-2 Score: 1  Alcohol Screen: Not on file  Housing: Low Risk (11/09/2022)   Received from [facility]   Epic    What is your living situation today?: I have a steady place to live    Think about the place you live. Do you have problems with any of the following? Choose all that apply:: None/None on this [name]  Utilities: Low Risk (11/09/2022)   Received from [facility]   Utilities    In the past 12 months has the electric, gas, oil, or water company threatened to shut off services in your home? : No   Health Literacy: Not on file     Observations/Objective: Appears well in NAD Breathing normally and speaking in full sentences Skin appears warm and dry  Assessment and Plan:  See Problem [name] for Assessment and Plan of chronic medical problems.   Follow Up Instructions:    I discussed the assessment and treatment plan with the patient. The patient was provided an opportunity to ask questions and all were answered. The patient agreed with the plan and demonstrated an understanding of the instructions.   The patient was advised to call back or seek an in-person evaluation if the symptoms worsen or if the condition fails to improve as anticipated.    Glade JINNY Hope, MD

## 2024-05-29 NOTE — Patient Instructions (Addendum)
 Melvin [name]

## 2024-06-19 ENCOUNTER — Ambulatory Visit: Admitting: Family Medicine

## 2024-07-09 ENCOUNTER — Ambulatory Visit: Admitting: Family Medicine

## 2025-04-09 ENCOUNTER — Ambulatory Visit

## 2025-04-09 ENCOUNTER — Encounter: Admitting: Internal Medicine
